# Patient Record
Sex: Male | Born: 1985 | Race: Black or African American | Hispanic: No | Marital: Single | Smoking: Never smoker
Health system: Southern US, Community
[De-identification: ages and names within clinical notes are randomized; demographics above are authoritative.]

## PROBLEM LIST (undated history)

## (undated) DIAGNOSIS — Z8673 Personal history of transient ischemic attack (TIA), and cerebral infarction without residual deficits: Secondary | ICD-10-CM

## (undated) DIAGNOSIS — E790 Hyperuricemia without signs of inflammatory arthritis and tophaceous disease: Secondary | ICD-10-CM

## (undated) DIAGNOSIS — I10 Essential (primary) hypertension: Secondary | ICD-10-CM

## (undated) DIAGNOSIS — I5020 Unspecified systolic (congestive) heart failure: Secondary | ICD-10-CM

## (undated) DIAGNOSIS — K219 Gastro-esophageal reflux disease without esophagitis: Secondary | ICD-10-CM

## (undated) DIAGNOSIS — N184 Chronic kidney disease, stage 4 (severe): Secondary | ICD-10-CM

## (undated) DIAGNOSIS — Z8679 Personal history of other diseases of the circulatory system: Secondary | ICD-10-CM

## (undated) DIAGNOSIS — E669 Obesity, unspecified: Secondary | ICD-10-CM

## (undated) DIAGNOSIS — I129 Hypertensive chronic kidney disease with stage 1 through stage 4 chronic kidney disease, or unspecified chronic kidney disease: Secondary | ICD-10-CM

## (undated) DIAGNOSIS — E785 Hyperlipidemia, unspecified: Secondary | ICD-10-CM

## (undated) DIAGNOSIS — M109 Gout, unspecified: Secondary | ICD-10-CM

## (undated) DIAGNOSIS — I428 Other cardiomyopathies: Secondary | ICD-10-CM

## (undated) DIAGNOSIS — R7303 Prediabetes: Secondary | ICD-10-CM

## (undated) DIAGNOSIS — M1990 Unspecified osteoarthritis, unspecified site: Secondary | ICD-10-CM

## (undated) DIAGNOSIS — L739 Follicular disorder, unspecified: Secondary | ICD-10-CM

## (undated) HISTORY — DX: Chronic kidney disease, stage 4 (severe): N18.4

## (undated) HISTORY — DX: Hyperuricemia without signs of inflammatory arthritis and tophaceous disease: E79.0

## (undated) HISTORY — DX: Hyperlipidemia, unspecified: E78.5

## (undated) HISTORY — DX: Hypertensive chronic kidney disease with stage 1 through stage 4 chronic kidney disease, or unspecified chronic kidney disease: I12.9

## (undated) HISTORY — DX: Personal history of other diseases of the circulatory system: Z86.79

## (undated) HISTORY — DX: Unspecified osteoarthritis, unspecified site: M19.90

## (undated) HISTORY — DX: Gastro-esophageal reflux disease without esophagitis: K21.9

## (undated) HISTORY — DX: Essential (primary) hypertension: I10

## (undated) HISTORY — DX: Obesity, unspecified: E66.9

## (undated) HISTORY — DX: Personal history of transient ischemic attack (TIA), and cerebral infarction without residual deficits: Z86.73

## (undated) HISTORY — DX: Prediabetes: R73.03

## (undated) HISTORY — DX: Other cardiomyopathies: I42.8

---
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
## 2007-08-23 ENCOUNTER — Emergency Department (HOSPITAL_COMMUNITY): Admission: EM | Admit: 2007-08-23 | Discharge: 2007-08-23 | Payer: Self-pay | Admitting: Emergency Medicine

## 2007-08-24 ENCOUNTER — Emergency Department (HOSPITAL_COMMUNITY): Admission: EM | Admit: 2007-08-24 | Discharge: 2007-08-24 | Payer: Self-pay | Admitting: Emergency Medicine

## 2008-09-20 DIAGNOSIS — Z8673 Personal history of transient ischemic attack (TIA), and cerebral infarction without residual deficits: Secondary | ICD-10-CM

## 2008-09-20 HISTORY — DX: Personal history of transient ischemic attack (TIA), and cerebral infarction without residual deficits: Z86.73

## 2008-09-27 ENCOUNTER — Ambulatory Visit: Payer: Self-pay | Admitting: Pulmonary Disease

## 2008-09-27 ENCOUNTER — Ambulatory Visit: Payer: Self-pay | Admitting: Internal Medicine

## 2008-09-27 ENCOUNTER — Encounter: Payer: Self-pay | Admitting: Emergency Medicine

## 2008-09-27 ENCOUNTER — Inpatient Hospital Stay (HOSPITAL_COMMUNITY): Admission: AD | Admit: 2008-09-27 | Discharge: 2008-10-04 | Payer: Self-pay | Admitting: Internal Medicine

## 2008-09-28 ENCOUNTER — Encounter (INDEPENDENT_AMBULATORY_CARE_PROVIDER_SITE_OTHER): Payer: Self-pay | Admitting: Internal Medicine

## 2008-10-08 ENCOUNTER — Ambulatory Visit (HOSPITAL_COMMUNITY): Admission: RE | Admit: 2008-10-08 | Discharge: 2008-10-08 | Payer: Self-pay | Admitting: Internal Medicine

## 2008-10-15 ENCOUNTER — Encounter (HOSPITAL_COMMUNITY): Admission: RE | Admit: 2008-10-15 | Discharge: 2008-11-14 | Payer: Self-pay | Admitting: Internal Medicine

## 2009-12-13 ENCOUNTER — Emergency Department (HOSPITAL_COMMUNITY): Admission: EM | Admit: 2009-12-13 | Discharge: 2009-12-13 | Payer: Self-pay | Admitting: Emergency Medicine

## 2010-10-11 ENCOUNTER — Encounter: Payer: Self-pay | Admitting: Internal Medicine

## 2011-01-04 LAB — CBC
HCT: 52.2 % — ABNORMAL HIGH (ref 39.0–52.0)
Hemoglobin: 16.1 g/dL (ref 13.0–17.0)
Hemoglobin: 16.6 g/dL (ref 13.0–17.0)
MCHC: 33.1 g/dL (ref 30.0–36.0)
MCHC: 33.8 g/dL (ref 30.0–36.0)
MCHC: 33.8 g/dL (ref 30.0–36.0)
MCV: 89.4 fL (ref 78.0–100.0)
MCV: 90 fL (ref 78.0–100.0)
MCV: 90.1 fL (ref 78.0–100.0)
Platelets: 314 10*3/uL (ref 150–400)
Platelets: 260 10*3/uL (ref 150–400)
RBC: 6.12 MIL/uL — ABNORMAL HIGH (ref 4.22–5.81)
RBC: 5.29 MIL/uL (ref 4.22–5.81)
RBC: 5.44 MIL/uL (ref 4.22–5.81)
RBC: 5.58 MIL/uL (ref 4.22–5.81)
RBC: 5.8 MIL/uL (ref 4.22–5.81)
RBC: 5.88 MIL/uL — ABNORMAL HIGH (ref 4.22–5.81)
RDW: 14.2 % (ref 11.5–15.5)
WBC: 10.1 10*3/uL (ref 4.0–10.5)
WBC: 11.1 10*3/uL — ABNORMAL HIGH (ref 4.0–10.5)
WBC: 12.1 10*3/uL — ABNORMAL HIGH (ref 4.0–10.5)
WBC: 13.3 10*3/uL — ABNORMAL HIGH (ref 4.0–10.5)
WBC: 14.1 10*3/uL — ABNORMAL HIGH (ref 4.0–10.5)

## 2011-01-04 LAB — BRAIN NATRIURETIC PEPTIDE
Pro B Natriuretic peptide (BNP): 122 pg/mL — ABNORMAL HIGH (ref 0.0–100.0)
Pro B Natriuretic peptide (BNP): 128 pg/mL — ABNORMAL HIGH (ref 0.0–100.0)

## 2011-01-04 LAB — RAPID URINE DRUG SCREEN, HOSP PERFORMED
Cocaine: NOT DETECTED
Opiates: POSITIVE — AB
Tetrahydrocannabinol: NOT DETECTED

## 2011-01-04 LAB — DIFFERENTIAL
Lymphocytes Relative: 22 % (ref 12–46)
Lymphs Abs: 2.2 10*3/uL (ref 0.7–4.0)
Monocytes Relative: 5 % (ref 3–12)
Neutro Abs: 7.2 10*3/uL (ref 1.7–7.7)
Neutrophils Relative %: 72 % (ref 43–77)

## 2011-01-04 LAB — PROTIME-INR
INR: 1 (ref 0.00–1.49)
INR: 1.1 (ref 0.00–1.49)
INR: 2.3 — ABNORMAL HIGH (ref 0.00–1.49)
Prothrombin Time: 13.4 seconds (ref 11.6–15.2)
Prothrombin Time: 14.2 seconds (ref 11.6–15.2)
Prothrombin Time: 15.6 seconds — ABNORMAL HIGH (ref 11.6–15.2)
Prothrombin Time: 19.6 seconds — ABNORMAL HIGH (ref 11.6–15.2)
Prothrombin Time: 26.8 seconds — ABNORMAL HIGH (ref 11.6–15.2)

## 2011-01-04 LAB — BASIC METABOLIC PANEL
BUN: 24 mg/dL — ABNORMAL HIGH (ref 6–23)
BUN: 34 mg/dL — ABNORMAL HIGH (ref 6–23)
CO2: 24 mEq/L (ref 19–32)
CO2: 24 mEq/L (ref 19–32)
Calcium: 9.2 mg/dL (ref 8.4–10.5)
Calcium: 8.9 mg/dL (ref 8.4–10.5)
Calcium: 9.1 mg/dL (ref 8.4–10.5)
Chloride: 111 mEq/L (ref 96–112)
Creatinine, Ser: 2.22 mg/dL — ABNORMAL HIGH (ref 0.4–1.5)
Creatinine, Ser: 2.25 mg/dL — ABNORMAL HIGH (ref 0.4–1.5)
GFR calc Af Amer: 44 mL/min — ABNORMAL LOW (ref >60)
GFR calc Af Amer: 36 mL/min — ABNORMAL LOW (ref >60)
GFR calc Af Amer: 44 mL/min — ABNORMAL LOW (ref >60)
GFR calc non Af Amer: 37 mL/min — ABNORMAL LOW (ref >60)
GFR calc non Af Amer: 30 mL/min — ABNORMAL LOW (ref >60)
Sodium: 138 mEq/L (ref 135–145)

## 2011-01-04 LAB — URINE MICROSCOPIC-ADD ON

## 2011-01-04 LAB — COMPREHENSIVE METABOLIC PANEL
ALT: 31 U/L (ref 0–53)
AST: 21 U/L (ref 0–37)
BUN: 22 mg/dL (ref 6–23)
CO2: 20 mEq/L (ref 19–32)
CO2: 21 mEq/L (ref 19–32)
Calcium: 8.9 mg/dL (ref 8.4–10.5)
Calcium: 9.3 mg/dL (ref 8.4–10.5)
Chloride: 107 mEq/L (ref 96–112)
Chloride: 109 mEq/L (ref 96–112)
Creatinine, Ser: 2.34 mg/dL — ABNORMAL HIGH (ref 0.4–1.5)
GFR calc Af Amer: 41 mL/min — ABNORMAL LOW (ref >60)
GFR calc non Af Amer: 34 mL/min — ABNORMAL LOW (ref >60)
GFR calc non Af Amer: 34 mL/min — ABNORMAL LOW (ref >60)
Sodium: 142 mEq/L (ref 135–145)
Total Bilirubin: 1.9 mg/dL — ABNORMAL HIGH (ref 0.3–1.2)

## 2011-01-04 LAB — BLOOD GAS, ARTERIAL
Bicarbonate: 20.9 mEq/L (ref 20.0–24.0)
FIO2: 0.21 %
O2 Saturation: 91.9 %
Patient temperature: 98.6
pH, Arterial: 7.446 (ref 7.350–7.450)
pO2, Arterial: 62.4 mmHg — ABNORMAL LOW (ref 80.0–100.0)

## 2011-01-04 LAB — CARDIAC PANEL(CRET KIN+CKTOT+MB+TROPI)
CK, MB: 1.2 ng/mL (ref 0.3–4.0)
CK, MB: 1.7 ng/mL (ref 0.3–4.0)
Relative Index: INVALID (ref 0.0–2.5)
Total CK: 141 U/L (ref 7–232)
Total CK: 351 U/L — ABNORMAL HIGH (ref 7–232)
Total CK: 95 U/L (ref 7–232)
Troponin I: 0.05 ng/mL (ref 0.00–0.06)
Troponin I: 0.07 ng/mL — ABNORMAL HIGH (ref 0.00–0.06)

## 2011-01-04 LAB — URINALYSIS, ROUTINE W REFLEX MICROSCOPIC
Bilirubin Urine: NEGATIVE
Glucose, UA: NEGATIVE mg/dL
Specific Gravity, Urine: >1.03 — ABNORMAL HIGH (ref 1.005–1.030)
Urobilinogen, UA: 0.2 mg/dL (ref 0.0–1.0)

## 2011-01-04 LAB — RENAL FUNCTION PANEL
Calcium: 9.3 mg/dL (ref 8.4–10.5)
Creatinine, Ser: 2.16 mg/dL — ABNORMAL HIGH (ref 0.4–1.5)
GFR calc Af Amer: 46 mL/min — ABNORMAL LOW (ref >60)
GFR calc non Af Amer: 38 mL/min — ABNORMAL LOW (ref >60)
Phosphorus: 3.8 mg/dL (ref 2.3–4.6)
Sodium: 142 mEq/L (ref 135–145)

## 2011-01-04 LAB — HEPARIN LEVEL (UNFRACTIONATED)
Heparin Unfractionated: 0.5 IU/mL (ref 0.30–0.70)
Heparin Unfractionated: 0.63 IU/mL (ref 0.30–0.70)
Heparin Unfractionated: 0.63 IU/mL (ref 0.30–0.70)
Heparin Unfractionated: 1.42 IU/mL — ABNORMAL HIGH (ref 0.30–0.70)

## 2011-01-04 LAB — APTT: aPTT: 28 seconds (ref 24–37)

## 2011-01-04 LAB — PROTEIN, URINE, 24 HOUR
Collection Interval-UPROT: 24 hours
Urine Total Volume-UPROT: 6500 mL

## 2011-01-04 LAB — HOMOCYSTEINE: Homocysteine: 24.9 umol/L — ABNORMAL HIGH (ref 4.0–15.4)

## 2011-01-04 LAB — D-DIMER, QUANTITATIVE: D-Dimer, Quant: 0.8 ug/mL-FEU — ABNORMAL HIGH (ref 0.00–0.48)
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
## 2011-02-02 NOTE — Discharge Summary (Signed)
Zachary Lynch, Zachary Lynch                  ACCOUNT NO.:  [id number]   MEDICAL RECORD NO.:  [id number]          PATIENT TYPE:  INP   LOCATION:  3036                         FACILITY:  [facility]   PHYSICIAN:  Rexene Alberts, M.D.    DATE OF BIRTH:  [birth year]   DATE OF ADMISSION:  09/27/2008  DATE OF DISCHARGE:  10/04/2008                               DISCHARGE SUMMARY   DISCHARGE DIAGNOSES:  1. Acute right hemispheric stroke, felt to be cardioembolic given the      low ejection fraction of 15%.  (Anticoagulation was started during      the hospitalization.)  2. Moderate focal narrowing of the distal M-1 segment of the right      middle cerebral artery per MRA of the head on September 28, 2008.  3. Mild dysarthria and mild left-sided hemiparesis secondary to the      acute right brain stroke.  4. Severe cardiomyopathy with left and right heart dysfunction and an      ejection fraction of 15% per 2D echocardiogram on September 28, 2008.      (Suspect nonischemic cardiomyopathy.)  LifeVest applied prior to      hospital discharge.  5. Nonsustained ventricular tachycardia.  6. Suspect obstructive sleep apnea.  (The patient will need to be      scheduled for an outpatient sleep study.)  7. Hypertension.  8. Dyslipidemia.  9. Chronic kidney disease, stage III, with proteinuria.  Baseline      creatinine appears to be 2.2.  10.Elevated homocystine level.  11.Upper respiratory infection.   DISCHARGE MEDICATIONS:  1. Aspirin 325 mg daily.  2. Coreg 12.5 mg b.i.d.  3. Lasix 40 mg daily.  4. Hydralazine 25 mg t.i.d.  5. Isosorbide mononitrate 30 mg daily.  6. Protonix 40 mg daily.  7. Potassium chloride 20 mEq twice daily for 1 week and then 1 pill      daily thereafter.  8. Zocor 40 mg nightly.  9. Foltx 1 tablet daily.  10.Coumadin 5 mg take 1-1/2 pills (7.5 mg) each evening.   DISCHARGE DISPOSITION:  The patient was discharged to home in improved  and stable condition.  He was advised to follow  up with [facility] on October 07, 2008, for reevaluation of his INR.  The patient was also advised to follow up with his primary care  physician Dr. Gerarda Fraction in 1-2 weeks or sooner.  He was also instructed to  follow up with neurologist, Dr. Leonie Man in 2 months.   CONSULTATIONS:  1. Neurologist, Dr. Rosine Beat  2. Pulmonologist, Dr. Elsworth Soho.  3. Cardiologist, Dr. Elisabeth Cara and colleagues.  4. Cardiologist, Dr. Virl Axe.  5. Stroke Team.   PROCEDURES PERFORMED:  1. MRA of the head and MRA of the neck on September 28, 2008.  2. CT scan of the head without contrast on September 28, 2008.  3. Renal ultrasound on September 29, 2008.  4. Chest x-ray on September 29, 2008.  5. A 2D echocardiogram on September 28, 2008.  The results revealed left      ventricular size  was at the upper limits of normal.  Overall, left      ventricular systolic function was severely reduced.  Left      ventricular ejection fraction was estimated to be 15%.  There was      severe diffuse left ventricular hypokinesis.  Left ventricular wall      thickness was mildly increased.  Mild mitral valve regurgitation.      Left atrium was mildly dilated.  Right ventricular systolic      function was markedly reduced.  6. Bilateral lower extremity venous Doppler study on September 28, 2008.      The results revealed no evidence of deep vein thrombosis.  7. Carotid Doppler study on September 28, 2008.  The results revealed no      ICA stenosis.  Vertebral artery flow antegrade.  8. V/Q scan on September 28, 2008.  The results revealed low probability      for pulmonary embolism.  9. Transcranial Doppler study performed on October 01, 2008.  10.Left upper extremity PICC inserted and then discontinued during the      hospitalization.   HISTORY OF PRESENT ILLNESS:  The patient is a 25 year old man with a  past medical history significant for hypertension, who presented to  [facility] on September 27, 2008, with a chief  complaint of  slurred speech and a left-sided facial droop.  He was evaluated in the  emergency department at [facility].  An MRI of the brain was  ordered and it revealed an acute right hemispheric stroke.  The patient  was therefore transferred to [facility] for further evaluation  and management.  Of note, the patient had been started on a Z-Pak and an  albuterol nebulizer for an upper respiratory infection 2 days prior to  his presentation to the emergency department at [facility].   For additional details, please see the dictated history and physical.   HOSPITAL COURSE:  1. Acute right hemispheric stroke, mild dysarthria, left hemiparesis.      The patient's swallowing function was evaluated at the time of the      initial hospital assessment and he apparently passed.  He was then      started on aspirin therapy.  For further evaluation, a number of      laboratory and radiographic studies were ordered.  In the meantime,      neurologist, Dr. Rosine Beat from the Stroke Team was consulted.  Dr.      Rosine Beat evaluated the patient upon his arrival to [facility].  She agreed with the workup that was started.  She also      recommended ordering an echocardiogram with a bubble study, MRA of      the brain and neck, and CT of the chest to rule out pulmonary      embolism.  The results of the laboratory studies revealed a urine      drug screen that was only positive for opiates, a urinalysis that      was positive for greater than 300 protein, an ABG which revealed a      PCO2 of 31 and a PO2 of 62, a WBC of 12.1, a D-dimer of 0.80, and a      troponin I of 0.07.  The random blood cortisol was within normal      limits at 16.7.  His TSH was within normal limits at 3.3.  His  homocysteine level was elevated at 24.9.  He was therefore started      on Foltx.  His BNP was marginally elevated at 128.  Further cardiac      enzymes were negative with a  troponin I on followup of 0.04.  His      hemoglobin A1c was 5.7.  His electrolyte panel was significant for      a serum potassium of 3.2 and a creatinine of 2.65.  The patient was      repleted with potassium chloride during the hospitalization.  A      followup CT scan of the head on September 28, 2008, revealed an      expected evolution of the right MCA territory infarct, but no      evidence of a new infarct or intracranial hemorrhage.  The MRA of      the head and neck revealed a somewhat limited exam, but there was      no evidence of a complete occlusion of the right carotid terminus      or M1 segment of the right middle cerebral artery, but there may      have been a focal moderate narrowing of the distal M1 segment of      the right middle cerebral artery.  The results of the carotid      Doppler study were negative.  The results of the transcranial      Doppler study were pending at the time of hospital discharge.   The patient's 2D echocardiogram revealed an ejection fraction of 15% and  severe left ventricular hypokinesis and right ventricular systolic  function that was markedly reduced.  Given this finding, cardiologist  Dr. Elisabeth Cara was consulted.  Additional details are below; however, given  the patient's very low ejection fraction, the stroke was felt to be  cardioembolic in nature and therefore, anticoagulation was started with  heparin and Coumadin.  Over the course of the hospitalization, the  patient received physical therapy, occupational therapy, and speech  therapy.  With the therapies given, the extent of his dysarthria  decreased and he actually became stronger with regards to his left-sided  strength.  At the time of hospital discharge, the patient was ambulating  without any assistance.  He was discharged to home on Coumadin once the  INR  became therapeutic.  The patient was advised to follow up with  neurologist, Dr. Leonie Man, for a potential bubble study in the  next 2-3  months.   1. Severe cardiomyopathy with left and right heart dysfunction and an      ejection fraction of 15%.  As indicated above, the echocardiogram      revealed severe LV and RV dysfunction.  Once these findings were      ascertained, cardiologist, Dr. Elisabeth Cara was consulted.  He      recommended aggressive treatment of the patient's hypertension.      Per his recommendations, intravenous heparin and Coumadin were      started.  A number of cardiac medications were added along the way      including Lasix, hydralazine, Imdur, and Coreg.  The patient's      fasting lipid panel was assessed and it revealed a total      cholesterol of 197, triglycerides of 131, HDL cholesterol of 17,      and LDL cholesterol of 154.  He was therefore started on Zocor 40      mg nightly.  As indicated  above, cardiac enzymes were ordered.  The      initial troponin I was only marginally elevated.  Subsequently, the      troponin-I normalized.  There was a discussion about a need for a      TEE; however, given the overall clinical scenario, Dr. Elisabeth Cara felt      that it was not needed at the time of his followup assessment given      what was already known.  The patient experienced nonsustained      ventricular tachycardia during the hospitalization.  Cardiologist,      Dr. Melvern Banker evaluated the patient following this episode.  He      consulted EP physician, Dr. Caryl Comes.  Dr. Caryl Comes evaluated the patient      and recommended ongoing medical therapy as was previously started.      He also strongly recommended the LifeVest for this young patient.      Once the LifeVest was obtained, it was applied.  The patient was      educated on the application of the LifeVest.  He will need to be      maintained on the LifeVest for approximately 3-6 months, hopefully,      to improve his LV function.  As stated above, anticoagulation will      continue with Coumadin.  Aspirin therapy was also added.  The       patient's INR was therapeutic at 2.3 prior to hospital discharge.      He was discharged to home on 7.5 mg of Coumadin daily.  The patient      will need to have his INR rechecked by either his primary care      physician, Dr. Gerarda Fraction, or by [facility]      Cardiology in approximately 3-4 days following hospital discharge.      Appropriate phone numbers were given to the patient and he      demonstrated good understanding of the instructions.   1. Suspected obstructive sleep apnea and upper respiratory infection.      The patient developed some respiratory distress during the first 24-      48 hours of the hospitalization.  For further evaluation, a D-dimer      was ordered and it was elevated at 0.8.  His ABG revealed a pH of      7.4, PCO2 of 30.8, and PO2 of 62.4 on room air.  Pulmonologist, Dr.      Elsworth Soho was consulted.  Per his assessment, the patient probably had      obstructive sleep apnea and in the setting of an acute stroke, he      may have become dyspneic as a result.  Nevertheless, because of the      elevated D-dimer, a V/Q scan was ordered to rule out PE.  It      revealed low probability for PE.  A chest x-ray was ordered and      revealed no acute disease initially.  However, the followup chest x-      ray did reveal some vascular congestion.  The patient was started      on Lasix and empiric antibiotic treatment with Avelox.  CPAP      therapy was started empirically.  The patient tolerated CPAP      therapy during the hospital course.  Dr. Elsworth Soho recommended having      the patient scheduled for an outpatient sleep study.  The      scheduling was not performed during the hospitalization and      therefore it can be arranged in the outpatient setting by his      primary physicians.  He completed the course of Avelox.  At the      time of his hospital discharge, the patient was oxygenating 97% on      room air and had no complaints of shortness of  breath.   1. Chronic kidney disease and proteinuria.  At the time of the initial      hospital assessment, the patient's creatinine was 2.22.  A renal      ultrasound was ordered and it revealed no evidence of      hydronephrosis.  However, it did reveal increased renal cortical      echogenicity likely indicative of underlying medical renal disease.      His left      kidney measured 12.4 cm and his right kidney measured 10.3 cm.  A      24-hour urine evaluation was ordered and it revealed 2470 mg of      protein.  His renal function remained more or less stable generally      ranging from 2.1 to 2.3.  An outpatient Nephrology consultation is      recommended following discharge.      Rexene Alberts, M.D.  Electronically Signed     DF/MEDQ  D:  10/05/2008  T:  10/06/2008  Job:  [id number]   cc:   Sherrilee Gilles. Gerarda Fraction, MD  Tery Sanfilippo, MD  Deboraha Sprang, MD, [place] [facility]. Leonie Man, MD

## 2011-06-28 LAB — CBC
HCT: 51.4
Hemoglobin: 17.2 — ABNORMAL HIGH
MCHC: 33.5
MCV: 86
RBC: 5.98 — ABNORMAL HIGH
WBC: 7.7

## 2011-06-28 LAB — DIFFERENTIAL
Basophils Relative: 1
Eosinophils Absolute: 0.1 — ABNORMAL LOW
Eosinophils Relative: 1
Lymphs Abs: 2.8
Monocytes Absolute: 0.5
Monocytes Relative: 6
Neutrophils Relative %: 57

## 2011-06-28 LAB — BASIC METABOLIC PANEL
CO2: 28
Chloride: 107
Creatinine, Ser: 1.95 — ABNORMAL HIGH
GFR calc Af Amer: 52 — ABNORMAL LOW
Potassium: 3.8

## 2012-01-06 ENCOUNTER — Ambulatory Visit (INDEPENDENT_AMBULATORY_CARE_PROVIDER_SITE_OTHER): Payer: Self-pay | Admitting: Otolaryngology

## 2012-10-19 ENCOUNTER — Ambulatory Visit (INDEPENDENT_AMBULATORY_CARE_PROVIDER_SITE_OTHER): Payer: Self-pay | Admitting: Otolaryngology

## 2012-11-02 ENCOUNTER — Ambulatory Visit (INDEPENDENT_AMBULATORY_CARE_PROVIDER_SITE_OTHER): Payer: Self-pay | Admitting: Otolaryngology

## 2012-11-16 ENCOUNTER — Ambulatory Visit (INDEPENDENT_AMBULATORY_CARE_PROVIDER_SITE_OTHER): Payer: No Typology Code available for payment source | Admitting: Otolaryngology

## 2012-11-16 DIAGNOSIS — K219 Gastro-esophageal reflux disease without esophagitis: Secondary | ICD-10-CM

## 2012-11-16 DIAGNOSIS — R490 Dysphonia: Secondary | ICD-10-CM

## 2013-01-18 DIAGNOSIS — K219 Gastro-esophageal reflux disease without esophagitis: Secondary | ICD-10-CM | POA: Insufficient documentation

## 2013-08-08 ENCOUNTER — Encounter (HOSPITAL_COMMUNITY): Payer: Self-pay | Admitting: Emergency Medicine

## 2013-08-08 ENCOUNTER — Emergency Department (HOSPITAL_COMMUNITY)
Admission: EM | Admit: 2013-08-08 | Discharge: 2013-08-08 | Disposition: A | Discharge status: Home / Self Care | Payer: BC Managed Care – PPO | Attending: Emergency Medicine | Admitting: Emergency Medicine

## 2013-08-08 DIAGNOSIS — Z79899 Other long term (current) drug therapy: Secondary | ICD-10-CM | POA: Insufficient documentation

## 2013-08-08 DIAGNOSIS — I10 Essential (primary) hypertension: Secondary | ICD-10-CM

## 2013-08-08 DIAGNOSIS — M109 Gout, unspecified: Secondary | ICD-10-CM

## 2013-08-08 DIAGNOSIS — IMO0002 Reserved for concepts with insufficient information to code with codable children: Secondary | ICD-10-CM | POA: Insufficient documentation

## 2013-08-08 DIAGNOSIS — Z8673 Personal history of transient ischemic attack (TIA), and cerebral infarction without residual deficits: Secondary | ICD-10-CM | POA: Insufficient documentation

## 2013-08-08 DIAGNOSIS — R509 Fever, unspecified: Secondary | ICD-10-CM | POA: Insufficient documentation

## 2013-08-08 HISTORY — DX: Gout, unspecified: M10.9

## 2013-08-08 MED ORDER — PREDNISONE 20 MG PO TABS: 20.0000 mg | ORAL_TABLET | Freq: Two times a day (BID) | ORAL | Status: DC

## 2013-08-08 MED ORDER — HYDROMORPHONE HCL PF 2 MG/ML IJ SOLN
2.0000 mg | Freq: Once | INTRAMUSCULAR | Status: AC
  Administered 2013-08-08: 2 mg via INTRAMUSCULAR
  Filled 2013-08-08: qty 1

## 2013-08-08 MED ORDER — ONDANSETRON 8 MG PO TBDP
8.0000 mg | ORAL_TABLET | Freq: Once | ORAL | Status: AC
  Administered 2013-08-08: 8 mg via ORAL
  Filled 2013-08-08: qty 1

## 2013-08-08 MED ORDER — PREDNISONE 50 MG PO TABS
60.0000 mg | ORAL_TABLET | Freq: Once | ORAL | Status: AC
  Administered 2013-08-08: 60 mg via ORAL
  Filled 2013-08-08 (×2): qty 1

## 2013-08-08 MED ORDER — OXYCODONE-ACETAMINOPHEN 5-325 MG PO TABS: 1.0000 | ORAL_TABLET | ORAL | Status: DC | PRN

## 2013-08-08 NOTE — ED Notes (Signed)
Pt c/o pain in left ankle and foot since [facility].  Foot swollen and ankle swollen.  Pedal pulse present.

## 2013-08-08 NOTE — ED Provider Notes (Signed)
CSN: [id number]     Arrival date & time 08/08/13  1548 History  This chart was scribed for Zachary Blade, MD by Rolanda Lundborg, ED Scribe. This patient was seen in room APA19/APA19 and the patient's care was started at 5:12 PM.   Chief Complaint  Patient presents with  . Ankle Pain  . Foot Pain    The history is provided by the patient. No language interpreter was used.   HPI Comments: AERICK Lynch is a 27 y.o. male who presents to the Emergency Department complaining of left foot and left ankle pain due to gout since two days ago. He was taking preventative medication but recently stopped taking it. He also reports fever and lack of appetite. He has not taken any OTC pain medication. 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 When he woke his left side was numb, mouth was twisted, slurred speech for 2-3 days. He wore an external defibrillator for 3 months. His heart is fine now. He states he did not take his blood pressure medication today because he has not eaten and did not want to take it on an empty stomach.  Past Medical History  Diagnosis Date  . Gout   . Hypertension   . Stroke    History reviewed. No pertinent past surgical history. No family history on file. History  Substance Use Topics  . Smoking status: Not on file  . Smokeless tobacco: Not on file  . Alcohol Use: Not on file    Review of Systems  Constitutional: Positive for fever and appetite change.  Musculoskeletal: Positive for arthralgias (left foot\).  All other systems reviewed and are negative.    Allergies  Albuterol  Home Medications   Current Outpatient Rx  Name  Route  Sig  Dispense  Refill  . amLODipine (NORVASC) 10 MG tablet   Oral   Take 10 mg by mouth daily.         . carvedilol (COREG) 25 MG tablet   Oral   Take 50 mg by mouth 2 (two) times daily with a meal.         . pantoprazole (PROTONIX) 40 MG tablet   Oral   Take 40 mg by mouth daily.         Marland Kitchen  oxyCODONE-acetaminophen (PERCOCET) 5-325 MG per tablet   Oral   Take 1 tablet by mouth every 4 (four) hours as needed for severe pain.   30 tablet   0   . predniSONE (DELTASONE) 20 MG tablet   Oral   Take 1 tablet (20 mg total) by mouth 2 (two) times daily.   10 tablet   0    BP 194/127  Pulse 116  Temp(Src) 99.6 F (37.6 C) (Oral)  Resp 18  Ht 6\' 1"  (1.854 m)  Wt 350 lb (158.759 kg)  BMI 46.19 kg/m2  SpO2 100% Physical Exam  Nursing note and vitals reviewed. Constitutional: He is oriented to person, place, and time. He appears well-developed and well-nourished.  HENT:  Head: Normocephalic and atraumatic.  Right Ear: External ear normal.  Left Ear: External ear normal.  Eyes: Conjunctivae and EOM are normal. Pupils are equal, round, and reactive to light.  Neck: Normal range of motion and phonation normal. Neck supple.  Cardiovascular: Normal rate, regular rhythm, normal heart sounds and intact distal pulses.   Pulmonary/Chest: Effort normal and breath sounds normal. He exhibits no bony tenderness.  Abdominal: Soft. Normal appearance. There  is no tenderness.  Musculoskeletal: Normal range of motion.  Tenderness over the entire midfoot and swollen with swelling and mild erythema. No toe or intertrigonous abnormality.   Neurological: He is alert and oriented to person, place, and time. No cranial nerve deficit or sensory deficit. He exhibits normal muscle tone. Coordination normal.  Skin: Skin is warm, dry and intact.  Psychiatric: He has a normal mood and affect. His behavior is normal. Judgment and thought content normal.    ED Course  Procedures (including critical care time) Medications  HYDROmorphone (DILAUDID) injection 2 mg (2 mg Intramuscular Given 08/08/13 1759)  ondansetron (ZOFRAN-ODT) disintegrating tablet 8 mg (8 mg Oral Given 08/08/13 1759)  predniSONE (DELTASONE) tablet 60 mg (60 mg Oral Given 08/08/13 1759)    DIAGNOSTIC STUDIES: Oxygen Saturation is  100% on RA, normal by my interpretation.    COORDINATION OF CARE: 5:45 PM- Discussed treatment plan with pt which includes treatment with pain meds and antiinflammatories. Pt agrees to plan.   Patient Vitals for the past 24 hrs:  BP Temp Temp src Pulse Resp SpO2 Height Weight  08/08/13 2014 174/100 mmHg - - 95 18 98 % - -  08/08/13 1804 173/128 mmHg - - 102 18 98 % - -  08/08/13 1618 194/127 mmHg 99.6 F (37.6 C) Oral 116 18 100 % 6\' 1"  (1.854 m) 350 lb (158.759 kg)    7:58 PM- Will give pain medication and put him on prednisone. Advised pt to go back to PCP for preventative medication. Pt agrees to plan.  MDM   1. Gout attack   2. Hypertension    Gout attack, with improvement after ED treatment. He is stable for discharge.  Nursing Notes Reviewed/ Care Coordinated, and agree without changes. Applicable Imaging Reviewed.  Interpretation of Laboratory Data incorporated into ED treatment   Plan: Home Medications- Percocet, prednisone; Home Treatments and Observation- rest, elevation. Work release; return here if the recommended treatment, does not improve the symptoms; Recommended follow up- PCP, one week       I personally performed the services described in this documentation, which was scribed in my presence. The recorded information has been reviewed and is accurate.      Zachary Blade, MD 08/08/13 [phone]

## 2013-08-26 ENCOUNTER — Encounter (HOSPITAL_COMMUNITY): Payer: Self-pay | Admitting: Emergency Medicine

## 2013-08-26 ENCOUNTER — Emergency Department (HOSPITAL_COMMUNITY)
Admission: EM | Admit: 2013-08-26 | Discharge: 2013-08-26 | Disposition: A | Discharge status: Home / Self Care | Payer: BC Managed Care – PPO | Attending: Emergency Medicine | Admitting: Emergency Medicine

## 2013-08-26 DIAGNOSIS — Z79899 Other long term (current) drug therapy: Secondary | ICD-10-CM | POA: Insufficient documentation

## 2013-08-26 DIAGNOSIS — M109 Gout, unspecified: Secondary | ICD-10-CM

## 2013-08-26 DIAGNOSIS — Z8673 Personal history of transient ischemic attack (TIA), and cerebral infarction without residual deficits: Secondary | ICD-10-CM | POA: Insufficient documentation

## 2013-08-26 DIAGNOSIS — I10 Essential (primary) hypertension: Secondary | ICD-10-CM | POA: Insufficient documentation

## 2013-08-26 DIAGNOSIS — IMO0002 Reserved for concepts with insufficient information to code with codable children: Secondary | ICD-10-CM | POA: Insufficient documentation

## 2013-08-26 DIAGNOSIS — M1A00X1 Idiopathic chronic gout, unspecified site, with tophus (tophi): Secondary | ICD-10-CM | POA: Insufficient documentation

## 2013-08-26 MED ORDER — PREDNISONE 50 MG PO TABS
60.0000 mg | ORAL_TABLET | Freq: Once | ORAL | Status: AC
  Administered 2013-08-26: 60 mg via ORAL
  Filled 2013-08-26 (×2): qty 1

## 2013-08-26 MED ORDER — ONDANSETRON 4 MG PO TBDP
4.0000 mg | ORAL_TABLET | Freq: Once | ORAL | Status: AC
  Administered 2013-08-26: 4 mg via ORAL
  Filled 2013-08-26: qty 1

## 2013-08-26 MED ORDER — HYDROMORPHONE HCL PF 2 MG/ML IJ SOLN
2.0000 mg | Freq: Once | INTRAMUSCULAR | Status: AC
  Administered 2013-08-26: 2 mg via INTRAMUSCULAR
  Filled 2013-08-26: qty 1

## 2013-08-26 MED ORDER — INDOMETHACIN 25 MG PO CAPS: 25.0000 mg | ORAL_CAPSULE | Freq: Two times a day (BID) | ORAL | Status: DC

## 2013-08-26 MED ORDER — PREDNISONE 10 MG PO TABS: ORAL_TABLET | ORAL | Status: DC

## 2013-08-26 MED ORDER — OXYCODONE-ACETAMINOPHEN 10-325 MG PO TABS: 1.0000 | ORAL_TABLET | ORAL | Status: DC | PRN

## 2013-08-26 MED ORDER — COLCHICINE 0.6 MG PO TABS: 0.6000 mg | ORAL_TABLET | Freq: Two times a day (BID) | ORAL | Status: DC | PRN

## 2013-08-26 NOTE — ED Provider Notes (Signed)
CSN: [id number]     Arrival date & time 08/26/13  2030 History  This chart was scribed for Lebron Quam, MD by Eston Mould, ED Scribe. This patient was seen in room APA07/APA07 and the patient's care was started at 8:40 PM   Chief Complaint  Patient presents with  . Gout   The history is provided by the patient. No language interpreter was used.   HPI Comments: Zachary Lynch is a 27 y.o. male with a hx of Gout who presents to the Emergency Department complaining of Gout from his bilateral feet to his knees and R elbow pain that has been going on for 2 weeks. He states he has been in pain and states he only takes medications when he has flare-ups. He states he began having a flare up prior to the Thanksgiving holiday (2014). He states he was seen at [facility] (08/08/2013) and was given a shot of dilaudid and a oral pill of prednisone. He states he felt better within 2 days after. He states about 3 days after Thanksgiving, he began to flare up once more and ran out of his medications prior to seeing his PCP. He states he recently seen his PCP and was prescribed Augmentin due to having post-nasal drip and given a shot of dilaudid. Pt states his pain worsens when weight bearing. Pt denies any associated symptoms.    Past Medical History  Diagnosis Date  . Gout   . Hypertension   . Stroke   . Gout flare 11/25   History reviewed. No pertinent past surgical history. No family history on file. History  Substance Use Topics  . Smoking status: Never Smoker   . Smokeless tobacco: Not on file  . Alcohol Use: No    Review of Systems  Constitutional: Negative for fever, chills, diaphoresis, appetite change and fatigue.  HENT: Negative for mouth sores, sore throat and trouble swallowing.   Eyes: Negative for visual disturbance.  Respiratory: Negative for cough, chest tightness, shortness of breath and wheezing.   Cardiovascular: Negative for chest pain.  Gastrointestinal:  Negative for nausea, vomiting, abdominal pain, diarrhea and abdominal distention.  Endocrine: Negative for polydipsia, polyphagia and polyuria.  Genitourinary: Negative for dysuria, frequency and hematuria.  Musculoskeletal: Positive for gait problem.  Skin: Negative for color change, pallor and rash.  Neurological: Negative for dizziness, syncope, light-headedness and headaches.  Hematological: Does not bruise/bleed easily.  Psychiatric/Behavioral: Negative for behavioral problems and confusion.    Allergies  Albuterol  Home Medications   Current Outpatient Rx  Name  Route  Sig  Dispense  Refill  . amLODipine (NORVASC) 10 MG tablet   Oral   Take 10 mg by mouth daily.         . carvedilol (COREG) 25 MG tablet   Oral   Take 50 mg by mouth 2 (two) times daily with a meal.         . oxyCODONE-acetaminophen (PERCOCET) 5-325 MG per tablet   Oral   Take 1 tablet by mouth every 4 (four) hours as needed for severe pain.   30 tablet   0   . pantoprazole (PROTONIX) 40 MG tablet   Oral   Take 40 mg by mouth daily.         . predniSONE (DELTASONE) 20 MG tablet   Oral   Take 1 tablet (20 mg total) by mouth 2 (two) times daily.   10 tablet   0    Triage Vitals:BP 176/106  Pulse 95  Temp(Src) 98.6 F (37 C) (Oral)  Resp 20  Ht 6\' 1"  (1.854 m)  Wt 350 lb (158.759 kg)  BMI 46.19 kg/m2  SpO2 100%  Physical Exam  Nursing note and vitals reviewed. Constitutional: He is oriented to person, place, and time. He appears well-developed and well-nourished.  HENT:  Head: Normocephalic.  Right Ear: External ear normal.  Left Ear: External ear normal.  Nose: Nose normal.  Mouth/Throat: Oropharynx is clear and moist.  Eyes: EOM are normal. Pupils are equal, round, and reactive to light. Right eye exhibits no discharge. Left eye exhibits no discharge.  Neck: Normal range of motion. Neck supple. No tracheal deviation present.  No nuchal rigidity no meningeal signs   Cardiovascular: Normal rate and regular rhythm.   Pulmonary/Chest: Effort normal and breath sounds normal. No stridor. No respiratory distress. He has no wheezes. He has no rales.  Abdominal: Soft. He exhibits no distension and no mass. There is no tenderness. There is no rebound and no guarding.  Musculoskeletal: Normal range of motion. He exhibits no edema and no tenderness.  R elbow 2 cm tophi; L & R knee's tender but no effusion;   Neurological: He is alert and oriented to person, place, and time. He has normal reflexes. No cranial nerve deficit. Coordination normal.  Skin: Skin is warm. No rash noted. He is not diaphoretic. No erythema. No pallor.  No pettechia no purpura   ED Course  Procedures  DIAGNOSTIC STUDIES: Oxygen Saturation is 100% on RA, normal by my interpretation.    COORDINATION OF CARE: 8:49 PM-Discussed treatment plan which includes administer dilaudid, Zofran, and prednisone. Pt agreed to plan.   Labs Review Labs Reviewed - No data to display Imaging Review No results found.  EKG Interpretation   None       MDM   1. Gout    Medicated.  Plan is DC, colchicine, NSAID, Pain meds.  I personally performed the services described in this documentation, which was scribed in my presence. The recorded information has been reviewed and is accurate.    Lebron Quam, MD 09/06/13 [phone]

## 2013-08-26 NOTE — ED Notes (Signed)
Continued gout flareup. Pain increasing and spreading to both legs

## 2014-03-18 ENCOUNTER — Emergency Department (HOSPITAL_COMMUNITY)
Admission: EM | Admit: 2014-03-18 | Discharge: 2014-03-18 | Disposition: A | Discharge status: Home / Self Care | Payer: BC Managed Care – PPO | Attending: Emergency Medicine | Admitting: Emergency Medicine

## 2014-03-18 ENCOUNTER — Encounter (HOSPITAL_COMMUNITY): Payer: Self-pay | Admitting: Emergency Medicine

## 2014-03-18 DIAGNOSIS — Z79899 Other long term (current) drug therapy: Secondary | ICD-10-CM | POA: Insufficient documentation

## 2014-03-18 DIAGNOSIS — M10072 Idiopathic gout, left ankle and foot: Secondary | ICD-10-CM

## 2014-03-18 DIAGNOSIS — I10 Essential (primary) hypertension: Secondary | ICD-10-CM | POA: Insufficient documentation

## 2014-03-18 DIAGNOSIS — M109 Gout, unspecified: Secondary | ICD-10-CM | POA: Insufficient documentation

## 2014-03-18 DIAGNOSIS — Z8673 Personal history of transient ischemic attack (TIA), and cerebral infarction without residual deficits: Secondary | ICD-10-CM | POA: Insufficient documentation

## 2014-03-18 MED ORDER — PREDNISONE (PAK) 10 MG PO TABS: ORAL_TABLET | Freq: Every day | ORAL | Status: DC

## 2014-03-18 MED ORDER — OXYCODONE-ACETAMINOPHEN 5-325 MG PO TABS: 1.0000 | ORAL_TABLET | ORAL | Status: DC | PRN

## 2014-03-18 MED ORDER — DEXAMETHASONE SODIUM PHOSPHATE 4 MG/ML IJ SOLN
8.0000 mg | Freq: Once | INTRAMUSCULAR | Status: AC
  Administered 2014-03-18: 8 mg via INTRAMUSCULAR
  Filled 2014-03-18: qty 2

## 2014-03-18 NOTE — ED Notes (Signed)
Lt foot pain due to gout Pt using crutches.

## 2014-03-18 NOTE — ED Notes (Signed)
Pt had shrimp from [employer] on Friday, left foot pain began on Sat., pt states hx of gout and usually shrimp and red meat causes gout for pt., pt states he has been taking Aleve but none today

## 2014-03-18 NOTE — Discharge Instructions (Signed)
Do not take the narcotic if you are driving as it will make you sleepy. Follow up with Dr. Gerarda Fraction or return here as needed.  Gout Gout is when your joints become red, sore, and swell (inflammed). This is caused by the buildup of uric acid crystals in the joints. Uric acid is a chemical that is normally in the blood. If the level of uric acid gets too high in the blood, these crystals form in your joints and tissues. Over time, these crystals can form into masses near the joints and tissues. These masses can destroy bone and cause the bone to look misshapen (deformed). HOME CARE   Do not take aspirin for pain.  Only take medicine as told by your doctor.  Rest the joint as much as you can. When in bed, keep sheets and blankets off painful areas.  Keep the sore joints raised (elevated).  Put warm or cold packs on painful joints. Use of warm or cold packs depends on which works best for you.  Use crutches if the painful joint is in your leg.  Drink enough fluids to keep your pee (urine) clear or pale yellow. Limit alcohol, sugary drinks, and drinks with fructose in them.  Follow your diet instructions. Pay careful attention to how much protein you eat. Include fruits, vegetables, whole grains, and fat-free or low-fat milk products in your daily diet. Talk to your doctor or dietician about the use of coffee, vitamin C, and cherries. These may help lower uric acid levels.  Keep a healthy body weight. GET HELP RIGHT AWAY IF:   You have watery poop (diarrhea), throw up (vomit), or have any side effects from medicines.  You do not feel better in 24 hours, or you are getting worse.  Your joint becomes suddenly more tender, and you have chills or a fever. MAKE SURE YOU:   Understand these instructions.  Will watch your condition.  Will get help right away if you are not doing well or get worse. Document Released: 06/15/2008 Document Revised: 01/01/2013 Document Reviewed: 12/15/2009 [facility]  Patient Information 2015 [place], [place]. This information is not intended to replace advice given to you by your health care provider. Make sure you discuss any questions you have with your health care provider.  Low-Purine Diet Purines are compounds that affect the level of uric acid in your body. A low-purine diet is a diet that is low in purines. Eating a low-purine diet can prevent the level of uric acid in your body from getting too high and causing gout or kidney stones or both. WHAT DO I NEED TO KNOW ABOUT THIS DIET?  Choose low-purine foods. Examples of low-purine foods are listed in the next section.  Drink plenty of fluids, especially water. Fluids can help remove uric acid from your body. Try to drink 8-16 cups (1.9-3.8 L) a day.  Limit foods high in fat, especially saturated fat, as fat makes it harder for the body to get rid of uric acid. Foods high in saturated fat include pizza, cheese, ice cream, whole milk, fried foods, and gravies. Choose foods that are lower in fat and lean sources of protein. Use olive oil when cooking as it contains healthy fats that are not high in saturated fat.  Limit alcohol. Alcohol interferes with the elimination of uric acid from your body. If you are having a gout attack, avoid all alcohol.  Keep in mind that different people's bodies react differently to different foods. You will probably learn over  time which foods do or do not affect you. If you discover that a food tends to cause your gout to flare up, avoid eating that food. You can more freely enjoy foods that do not cause problems. If you have any questions about a food item, talk to your dietitian or health care provider. WHICH FOODS ARE LOW, MODERATE, AND HIGH IN PURINES? The following is a list of foods that are low, moderate, and high in purines. You can eat any amount of the foods that are low in purines. You may be able to have small amounts of foods that are moderate in purines. Ask your  health care provider how much of a food moderate in purines you can have. Avoid foods high in purines. Grains  Foods low in purines: Enriched white bread, pasta, rice, cake, cornbread, popcorn.  Foods moderate in purines: Whole-grain breads and cereals, wheat germ, bran, oatmeal. Uncooked oatmeal. Dry wheat bran or wheat germ.  Foods high in purines: Pancakes, [place] toast, biscuits, muffins. Vegetables  Foods low in purines: All vegetables, except those that are moderate in purines.  Foods moderate in purines: Asparagus, cauliflower, spinach, mushrooms, green peas. Fruits  All fruits are low in purines. Meats and other Protein Foods  Foods low in purines: Eggs, nuts, peanut butter.  Foods moderate in purines: 80-90% lean beef, lamb, veal, pork, poultry, fish, eggs, peanut butter, nuts. Crab, lobster, oysters, and shrimp. Cooked dried beans, peas, and lentils.  Foods high in purines: Anchovies, sardines, herring, mussels, tuna, codfish, scallops, trout, and haddock. Berniece Salines. Organ meats (such as liver or kidney). Tripe. Game meat. Goose. Sweetbreads. Dairy  All dairy foods are low in purines. Low-fat and fat-free dairy products are best because they are low in saturated fat. Beverages  Drinks low in purines: Water, carbonated beverages, tea, coffee, cocoa.  Drinks moderate in purines: Soft drinks and other drinks sweetened with high-fructose corn syrup. Juices. To find whether a food or drink is sweetened with high-fructose corn syrup, look at the ingredients list.  Drinks high in purines: Alcoholic beverages (such as beer). Condiments  Foods low in purines: Salt, herbs, olives, pickles, relishes, vinegar.  Foods moderate in purines: Butter, margarine, oils, mayonnaise. Fats and Oils  Foods low in purines: All types, except gravies and sauces made with meat.  Foods high in purines: Gravies and sauces made with meat. Other Foods  Foods low in purines: Sugars, sweets,  gelatin. Cake. Soups made without meat.  Foods moderate in purines: Meat-based or fish-based soups, broths, or bouillons. Foods and drinks sweetened with high-fructose corn syrup.  Foods high in purines: High-fat desserts (such as ice cream, cookies, cakes, pies, doughnuts, and chocolate). Contact your dietitian for more information on foods that are not listed here. Document Released: 01/01/2011 Document Revised: 09/11/2013 Document Reviewed: 08/13/2013 [facility] Patient Information 2015 [place], [place]. This information is not intended to replace advice given to you by your health care provider. Make sure you discuss any questions you have with your health care provider.

## 2014-03-18 NOTE — ED Notes (Signed)
Pt verbalized understanding of no driving within 4 hours of taking percocet due to med causes drowsiness and also constipation

## 2014-03-18 NOTE — ED Provider Notes (Signed)
History/physical exam/procedure(s) were performed by non-physician practitioner and as supervising physician I was immediately available for consultation/collaboration. I have reviewed all notes and am in agreement with care and plan.   Shaune Pollack, MD 03/18/14 [phone]

## 2014-03-18 NOTE — ED Provider Notes (Signed)
CSN: [id number]     Arrival date & time 03/18/14  1252 History   First MD Initiated Contact with Patient 03/18/14 1604     Chief Complaint  Patient presents with  . Foot Pain     (Consider location/radiation/quality/duration/timing/severity/associated sxs/prior Treatment) Patient is a 28 y.o. male presenting with lower extremity pain. The history is provided by the patient.  Foot Pain This is a recurrent problem. The current episode started in the past 7 days. The problem occurs constantly. The problem has been gradually worsening.   EHAN COSNER is a 28 y.o. male who presents to the ED with left foot pain that started 3 days ago. He states that he has a history of gout and on Friday he ate shrimp and the pain started after that. The pain has gotten progressively worse despite taking Aleve, elevation and using crutches when ambulating. He rates his pain as 4/10 but feel that is it increasing. He states that a shot of cortisone and then a dose pack usually helps.    Past Medical History  Diagnosis Date  . Gout   . Hypertension   . Stroke   . Gout flare 11/25   History reviewed. No pertinent past surgical history. History reviewed. No pertinent family history. History  Substance Use Topics  . Smoking status: Never Smoker   . Smokeless tobacco: Not on file  . Alcohol Use: No    Review of Systems Negative except as stated in HPI   Allergies  Albuterol  Home Medications   Prior to Admission medications   Medication Sig Start Date End Date Taking? Authorizing Raven Furnas  amLODipine (NORVASC) 10 MG tablet Take 10 mg by mouth daily.   Yes Historical Ruthvik Barnaby, MD  carvedilol (COREG) 25 MG tablet Take 50 mg by mouth 2 (two) times daily with a meal.   Yes Historical Eoghan Belcher, MD   BP 163/112  Pulse 82  Temp(Src) 98 F (36.7 C) (Oral)  Resp 18  Ht 6\' 2"  (1.88 m)  Wt 311 lb (141.069 kg)  BMI 39.91 kg/m2  SpO2 98% Physical Exam  Nursing note and vitals  reviewed. Constitutional: He is oriented to person, place, and time. He appears well-developed and well-nourished.  HENT:  Head: Normocephalic.  Eyes: EOM are normal.  Neck: Neck supple.  Cardiovascular: Normal rate.   Pulmonary/Chest: Effort normal.  Abdominal: Soft. There is no tenderness.  Musculoskeletal: Normal range of motion.       Left foot: He exhibits tenderness. He exhibits normal range of motion, no swelling, normal capillary refill, no deformity and no laceration.       Feet:  Tenderness over the left heel with palpation.   Neurological: He is alert and oriented to person, place, and time. No cranial nerve deficit.  Skin: Skin is warm and dry.  Psychiatric: He has a normal mood and affect. His behavior is normal.    ED Course  Procedures Discussed with the patient that this could be gout but could also be a heel spur. Encouraged him to follow up with his PCP and if symptoms persist to consider x-ray of his foot. Patient states that for now he thinks it is gout because of eating the shrimp and that often sets off the symptoms. Decadron 8 mg IM  MDM  28 y.o. male with left heel pain and history of gout. Stable for discharge without neurovascular deficits. Discussed with the patient and all questioned fully answered. He will follow up with his PCP or return  here if any problems arise.    Medication List    TAKE these medications       oxyCODONE-acetaminophen 5-325 MG per tablet  Commonly known as:  ROXICET  Take 1 tablet by mouth every 4 (four) hours as needed for severe pain.     predniSONE 10 MG tablet  Commonly known as:  STERAPRED UNI-PAK  Take by mouth daily. Starting 6/30 take 6 tablets PO then 5, 4, 3, 2,1      ASK your doctor about these medications       amLODipine 10 MG tablet  Commonly known as:  NORVASC  Take 10 mg by mouth daily.     carvedilol 25 MG tablet  Commonly known as:  COREG  Take 50 mg by mouth 2 (two) times daily with a meal.            Ashley Murrain, NP 03/18/14 1622

## 2014-08-19 ENCOUNTER — Emergency Department (HOSPITAL_COMMUNITY)
Admission: EM | Admit: 2014-08-19 | Discharge: 2014-08-19 | Disposition: A | Discharge status: Home / Self Care | Payer: BC Managed Care – PPO | Attending: Emergency Medicine | Admitting: Emergency Medicine

## 2014-08-19 ENCOUNTER — Encounter (HOSPITAL_COMMUNITY): Payer: Self-pay | Admitting: *Deleted

## 2014-08-19 DIAGNOSIS — I10 Essential (primary) hypertension: Secondary | ICD-10-CM | POA: Diagnosis not present

## 2014-08-19 DIAGNOSIS — Z8673 Personal history of transient ischemic attack (TIA), and cerebral infarction without residual deficits: Secondary | ICD-10-CM | POA: Insufficient documentation

## 2014-08-19 DIAGNOSIS — R42 Dizziness and giddiness: Secondary | ICD-10-CM | POA: Diagnosis not present

## 2014-08-19 DIAGNOSIS — Z79899 Other long term (current) drug therapy: Secondary | ICD-10-CM | POA: Diagnosis not present

## 2014-08-19 DIAGNOSIS — Z8739 Personal history of other diseases of the musculoskeletal system and connective tissue: Secondary | ICD-10-CM | POA: Insufficient documentation

## 2014-08-19 DIAGNOSIS — Z7952 Long term (current) use of systemic steroids: Secondary | ICD-10-CM | POA: Diagnosis not present

## 2014-08-19 LAB — CBC WITH DIFFERENTIAL/PLATELET
BASOS ABS: 0 10*3/uL (ref 0.0–0.1)
Basophils Relative: 0 % (ref 0–1)
Eosinophils Absolute: 0.1 10*3/uL (ref 0.0–0.7)
Eosinophils Relative: 1 % (ref 0–5)
HEMATOCRIT: 50.2 % (ref 39.0–52.0)
HEMOGLOBIN: 17.2 g/dL — AB (ref 13.0–17.0)
LYMPHS PCT: 34 % (ref 12–46)
Lymphs Abs: 3 10*3/uL (ref 0.7–4.0)
MCH: 30.8 pg (ref 26.0–34.0)
MCHC: 34.3 g/dL (ref 30.0–36.0)
MCV: 89.8 fL (ref 78.0–100.0)
MONO ABS: 0.5 10*3/uL (ref 0.1–1.0)
Monocytes Relative: 5 % (ref 3–12)
NEUTROS ABS: 5.4 10*3/uL (ref 1.7–7.7)
NEUTROS PCT: 60 % (ref 43–77)
Platelets: 265 10*3/uL (ref 150–400)
RBC: 5.59 MIL/uL (ref 4.22–5.81)
RDW: 15 % (ref 11.5–15.5)
WBC: 9 10*3/uL (ref 4.0–10.5)

## 2014-08-19 LAB — BASIC METABOLIC PANEL
ANION GAP: 12 (ref 5–15)
BUN: 20 mg/dL (ref 6–23)
CALCIUM: 9.1 mg/dL (ref 8.4–10.5)
CHLORIDE: 103 meq/L (ref 96–112)
CO2: 27 meq/L (ref 19–32)
Creatinine, Ser: 2.35 mg/dL — ABNORMAL HIGH (ref 0.50–1.35)
GFR calc Af Amer: 41 mL/min — ABNORMAL LOW (ref >90)
GFR calc non Af Amer: 35 mL/min — ABNORMAL LOW (ref >90)
Glucose, Bld: 89 mg/dL (ref 70–99)
Potassium: 3.7 mEq/L (ref 3.7–5.3)
SODIUM: 142 meq/L (ref 137–147)

## 2014-08-19 MED ORDER — AMLODIPINE BESYLATE 5 MG PO TABS
10.0000 mg | ORAL_TABLET | Freq: Once | ORAL | Status: AC
  Administered 2014-08-19: 10 mg via ORAL
  Filled 2014-08-19: qty 2

## 2014-08-19 MED ORDER — AMLODIPINE BESYLATE 10 MG PO TABS: 10.0000 mg | ORAL_TABLET | Freq: Every day | ORAL | Status: DC

## 2014-08-19 NOTE — Discharge Instructions (Signed)
Blood pressure is elevated close follow-up with your doctor will be important. Your Norvasc dosing is maxed out. The cor regular can take 25 mg twice a day instead of once a day. Would recommend doing that. Make appointment to follow-up with your doctor. Return for any significant headache chest pain shortness of breath or any strokelike symptoms. Lab studies without significant changes from 2010. He do have renal insufficiency but it isn't significantly changed over the past 5 years.

## 2014-08-19 NOTE — ED Notes (Signed)
Pt states his blood pressure was 220's/100's last night. Attempted to call PCP office but was unable to reach anyone. States this has been intermittent x 3 weeks with intermittent headache also. NAD at this time.

## 2014-08-19 NOTE — ED Provider Notes (Signed)
CSN: [id number]     Arrival date & time 08/19/14  1200 History   First MD Initiated Contact with Patient 08/19/14 1604     Chief Complaint  Patient presents with  . Hypertension     (Consider location/radiation/quality/duration/timing/severity/associated sxs/prior Treatment) Patient is a 28 y.o. male presenting with hypertension. The history is provided by the patient.  Hypertension Pertinent negatives include no chest pain, no abdominal pain, no headaches and no shortness of breath.   patient with long-standing history of hypertension. Running higher than usual. Ran out of his Norvasc. Patient is also on Coreg. Patient's blood pressures been running high for the past 3 weeks. Nurses wrote intermittent headache the patient denied headache to me. Talked about having some dizziness 2 days ago but it has resolved none since. No chest pain no shortness of breath no strokelike symptoms. The dizziness was more like some vertigo but as stated has not had any for the past 2 days. Patient is followed by [facility]. Still has his Coreg did not take any doses today. Ran out of his Norvasc. Needs a renewal of his Norvasc medicine.  Past Medical History  Diagnosis Date  . Gout   . Hypertension   . Stroke   . Gout flare 11/25   History reviewed. No pertinent past surgical history. No family history on file. History  Substance Use Topics  . Smoking status: Never Smoker   . Smokeless tobacco: Not on file  . Alcohol Use: No    Review of Systems  Constitutional: Negative for fever.  HENT: Negative for congestion.   Eyes: Negative for visual disturbance.  Respiratory: Negative for shortness of breath.   Cardiovascular: Negative for chest pain.  Gastrointestinal: Negative for nausea, vomiting and abdominal pain.  Genitourinary: Negative for dysuria.  Musculoskeletal: Negative for back pain and neck pain.  Skin: Negative for rash.  Neurological: Positive for dizziness. Negative for  weakness, numbness and headaches.  Hematological: Does not bruise/bleed easily.  Psychiatric/Behavioral: Negative for confusion.      Allergies  Albuterol  Home Medications   Prior to Admission medications   Medication Sig Start Date End Date Taking? Authorizing Provider  amLODipine (NORVASC) 10 MG tablet Take 10 mg by mouth at bedtime.    Yes Historical Provider, MD  carvedilol (COREG) 25 MG tablet Take 50 mg by mouth 2 (two) times daily with a meal.   Yes Historical Provider, MD  oxyCODONE-acetaminophen (ROXICET) 5-325 MG per tablet Take 1 tablet by mouth every 4 (four) hours as needed for severe pain. Patient not taking: Reported on 08/19/2014 03/18/14   Ashley Murrain, NP  predniSONE (STERAPRED UNI-PAK) 10 MG tablet Take by mouth daily. Starting 6/30 take 6 tablets PO then 5, 4, 3, 2,1 Patient not taking: Reported on 08/19/2014 03/18/14   Ashley Murrain, NP   BP 181/130 mmHg  Pulse 70  Temp(Src) 97.9 F (36.6 C) (Oral)  Resp 18  Ht 6\' 1"  (1.854 m)  Wt 316 lb 14.4 oz (143.745 kg)  BMI 41.82 kg/m2  SpO2 96% Physical Exam  Constitutional: He is oriented to person, place, and time. He appears well-developed and well-nourished. No distress.  HENT:  Head: Normocephalic and atraumatic.  Mouth/Throat: Oropharynx is clear and moist.  Eyes: Conjunctivae and EOM are normal. Pupils are equal, round, and reactive to light.  Neck: Normal range of motion. Neck supple.  Cardiovascular: Normal rate, regular rhythm and normal heart sounds.   No murmur heard. Pulmonary/Chest: Effort normal and breath sounds  normal. No respiratory distress.  Abdominal: Soft. Bowel sounds are normal. There is no tenderness.  Musculoskeletal: Normal range of motion. He exhibits no edema.  Neurological: He is alert and oriented to person, place, and time. No cranial nerve deficit. He exhibits normal muscle tone. Coordination normal.  Skin: Skin is warm. No rash noted.  Nursing note and vitals reviewed.   ED  Course  Procedures (including critical care time) Labs Review Labs Reviewed  BASIC METABOLIC PANEL - Abnormal; Notable for the following:    Creatinine, Ser 2.35 (*)    GFR calc non Af Amer 35 (*)    GFR calc Af Amer 41 (*)    All other components within normal limits  CBC WITH DIFFERENTIAL - Abnormal; Notable for the following:    Hemoglobin 17.2 (*)    All other components within normal limits   Results for orders placed or performed during the hospital encounter of [id number]  Basic metabolic panel  Result Value Ref Range   Sodium 142 137 - 147 mEq/L   Potassium 3.7 3.7 - 5.3 mEq/L   Chloride 103 96 - 112 mEq/L   CO2 27 19 - 32 mEq/L   Glucose, Bld 89 70 - 99 mg/dL   BUN 20 6 - 23 mg/dL   Creatinine, Ser 2.35 (H) 0.50 - 1.35 mg/dL   Calcium 9.1 8.4 - 10.5 mg/dL   GFR calc non Af Amer 35 (L) >90 mL/min   GFR calc Af Amer 41 (L) >90 mL/min   Anion gap 12 5 - 15  CBC with Differential  Result Value Ref Range   WBC 9.0 4.0 - 10.5 K/uL   RBC 5.59 4.22 - 5.81 MIL/uL   Hemoglobin 17.2 (H) 13.0 - 17.0 g/dL   HCT 50.2 39.0 - 52.0 %   MCV 89.8 78.0 - 100.0 fL   MCH 30.8 26.0 - 34.0 pg   MCHC 34.3 30.0 - 36.0 g/dL   RDW 15.0 11.5 - 15.5 %   Platelets 265 150 - 400 K/uL   Neutrophils Relative % 60 43 - 77 %   Neutro Abs 5.4 1.7 - 7.7 K/uL   Lymphocytes Relative 34 12 - 46 %   Lymphs Abs 3.0 0.7 - 4.0 K/uL   Monocytes Relative 5 3 - 12 %   Monocytes Absolute 0.5 0.1 - 1.0 K/uL   Eosinophils Relative 1 0 - 5 %   Eosinophils Absolute 0.1 0.0 - 0.7 K/uL   Basophils Relative 0 0 - 1 %   Basophils Absolute 0.0 0.0 - 0.1 K/uL     Imaging Review No results found.   EKG Interpretation None      MDM   Final diagnoses:  Essential hypertension    Patient is on Norvasc and Coreg. Has not had his Coreg today. I thought the patient was lighting the Coreg once a day but he is already taking it twice a day and actually is 50 mg twice a day. Probably does not have much room for  increasing current meds. He will follow his blood pressure follow-up with his regular doctor is CF getting back on the usual meds helps stabilize his blood pressure. Patient without any evidence of end organ damage of significance here today. Not a hypertensive crisis. Renal function although abnormal is not significant changed over the past 5 years.    Fredia Sorrow, MD 08/19/14 1818

## 2014-09-12 ENCOUNTER — Encounter (HOSPITAL_COMMUNITY): Payer: Self-pay | Admitting: *Deleted

## 2014-09-12 ENCOUNTER — Emergency Department (HOSPITAL_COMMUNITY)
Admission: EM | Admit: 2014-09-12 | Discharge: 2014-09-13 | Disposition: A | Discharge status: Home / Self Care | Payer: BC Managed Care – PPO | Attending: Emergency Medicine | Admitting: Emergency Medicine

## 2014-09-12 DIAGNOSIS — Z8673 Personal history of transient ischemic attack (TIA), and cerebral infarction without residual deficits: Secondary | ICD-10-CM | POA: Diagnosis not present

## 2014-09-12 DIAGNOSIS — I10 Essential (primary) hypertension: Secondary | ICD-10-CM | POA: Diagnosis not present

## 2014-09-12 DIAGNOSIS — Z79899 Other long term (current) drug therapy: Secondary | ICD-10-CM | POA: Diagnosis not present

## 2014-09-12 DIAGNOSIS — M109 Gout, unspecified: Secondary | ICD-10-CM | POA: Diagnosis present

## 2014-09-12 DIAGNOSIS — M10071 Idiopathic gout, right ankle and foot: Secondary | ICD-10-CM

## 2014-09-12 MED ORDER — DEXAMETHASONE SODIUM PHOSPHATE 10 MG/ML IJ SOLN
10.0000 mg | Freq: Once | INTRAMUSCULAR | Status: AC
  Administered 2014-09-13: 10 mg via INTRAMUSCULAR
  Filled 2014-09-12: qty 1

## 2014-09-12 MED ORDER — PREDNISONE 10 MG PO TABS
60.0000 mg | ORAL_TABLET | Freq: Once | ORAL | Status: AC
  Administered 2014-09-13: 60 mg via ORAL
  Filled 2014-09-12 (×2): qty 1

## 2014-09-12 NOTE — ED Notes (Signed)
Pt reporting increased pain from gout in right foot.  States that he hasn't been able to see his PCP this week due to him being out of the office.

## 2014-09-13 MED ORDER — PREDNISONE 10 MG PO TABS: ORAL_TABLET | ORAL | Status: DC

## 2014-09-13 NOTE — Discharge Instructions (Signed)
Your blood pressure is elevated. This will need follow-up. You will need medication. Follow-up with your primary care doctor. Prescription for prednisone

## 2014-09-13 NOTE — ED Provider Notes (Signed)
CSN: [id number]     Arrival date & time 09/12/14  2314 History   First MD Initiated Contact with Patient 09/12/14 2339     Chief Complaint  Patient presents with  . Gout     (Consider location/radiation/quality/duration/timing/severity/associated sxs/prior Treatment) HPI... Patient presents with pain in his right ankle and foot.  He has a known history of gout and he feels this is the same. He has been on colchicine for several days. Prednisone usually helps symptoms. No fever or chills. No other joint pain. Severity is moderate.  Past Medical History  Diagnosis Date  . Gout   . Hypertension   . Stroke   . Gout flare 11/25   History reviewed. No pertinent past surgical history. History reviewed. No pertinent family history. History  Substance Use Topics  . Smoking status: Never Smoker   . Smokeless tobacco: Not on file  . Alcohol Use: No    Review of Systems  All other systems reviewed and are negative.     Allergies  Albuterol  Home Medications   Prior to Admission medications   Medication Sig Start Date End Date Taking? Authorizing Provider  colchicine 0.6 MG tablet Take 0.6 mg by mouth daily.   Yes Historical Provider, MD  amLODipine (NORVASC) 10 MG tablet Take 1 tablet (10 mg total) by mouth daily. 08/19/14   Fredia Sorrow, MD  carvedilol (COREG) 25 MG tablet Take 50 mg by mouth 2 (two) times daily with a meal.    Historical Provider, MD  oxyCODONE-acetaminophen (ROXICET) 5-325 MG per tablet Take 1 tablet by mouth every 4 (four) hours as needed for severe pain. Patient not taking: Reported on 08/19/2014 03/18/14   Ashley Murrain, NP  predniSONE (DELTASONE) 10 MG tablet 5 tablets for 3 days, 4 tablets for 2 days, 3 tablets for 2 days, 2 tablets for 2 days, one tablet for 2 days 09/13/14   Nat Christen, MD   BP 212/136 mmHg  Pulse 99  Resp 27  Ht 6\' 4"  (1.93 m)  Wt 325 lb (147.419 kg)  BMI 39.58 kg/m2  SpO2 98% Physical Exam  Constitutional: He is oriented to  person, place, and time. He appears well-developed and well-nourished.  HENT:  Head: Normocephalic.  Musculoskeletal:  Right lower extremity: Right ankle and foot are tender and puffy. Pain with range of motion  Neurological: He is alert and oriented to person, place, and time.  Skin: Skin is warm and dry.  Psychiatric: He has a normal mood and affect. His behavior is normal.    ED Course  Procedures (including critical care time) Labs Review Labs Reviewed - No data to display  Imaging Review No results found.   EKG Interpretation None      MDM   Final diagnoses:  Acute idiopathic gout of right ankle    Patient has a known history of gout. IM Decadron.  Discharge home on prednisone taper. Elevated blood pressure noted and discussed with patient.    Nat Christen, MD 09/13/14 [phone]

## 2015-03-21 DIAGNOSIS — J384 Edema of larynx: Secondary | ICD-10-CM | POA: Insufficient documentation

## 2015-03-21 DIAGNOSIS — R490 Dysphonia: Secondary | ICD-10-CM | POA: Insufficient documentation

## 2015-07-31 ENCOUNTER — Encounter: Payer: Self-pay | Admitting: Cardiology

## 2015-07-31 ENCOUNTER — Ambulatory Visit (INDEPENDENT_AMBULATORY_CARE_PROVIDER_SITE_OTHER): Payer: BLUE CROSS/BLUE SHIELD | Admitting: Cardiology

## 2015-07-31 VITALS — BP 190/148 | HR 61 | Ht 74.0 in | Wt 311.0 lb

## 2015-07-31 DIAGNOSIS — Z8673 Personal history of transient ischemic attack (TIA), and cerebral infarction without residual deficits: Secondary | ICD-10-CM

## 2015-07-31 DIAGNOSIS — N183 Chronic kidney disease, stage 3 unspecified: Secondary | ICD-10-CM

## 2015-07-31 DIAGNOSIS — I10 Essential (primary) hypertension: Secondary | ICD-10-CM | POA: Diagnosis not present

## 2015-07-31 DIAGNOSIS — I429 Cardiomyopathy, unspecified: Secondary | ICD-10-CM | POA: Diagnosis not present

## 2015-07-31 NOTE — Patient Instructions (Signed)
Your physician recommends that you schedule a follow-up appointment in:  2 weeks with Dr Domenic Polite    Take BP twice a day for next 2 weeks    Your physician has requested that you have an echocardiogram. Echocardiography is a painless test that uses sound waves to create images of your heart. It provides your doctor with information about the size and shape of your heart and how well your heart's chambers and valves are working. This procedure takes approximately one hour. There are no restrictions for this procedure.    Thank you for choosing [facility] !
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
## 2015-08-01 ENCOUNTER — Ambulatory Visit (HOSPITAL_COMMUNITY)
Admission: RE | Admit: 2015-08-01 | Discharge: 2015-08-01 | Disposition: A | Discharge status: Home / Self Care | Payer: BLUE CROSS/BLUE SHIELD | Source: Ambulatory Visit | Attending: Cardiology | Admitting: Cardiology

## 2015-08-01 DIAGNOSIS — I10 Essential (primary) hypertension: Secondary | ICD-10-CM | POA: Diagnosis not present

## 2015-08-01 DIAGNOSIS — I429 Cardiomyopathy, unspecified: Secondary | ICD-10-CM | POA: Insufficient documentation

## 2015-08-18 ENCOUNTER — Encounter: Payer: Self-pay | Admitting: *Deleted

## 2015-08-18 ENCOUNTER — Ambulatory Visit (INDEPENDENT_AMBULATORY_CARE_PROVIDER_SITE_OTHER): Payer: BLUE CROSS/BLUE SHIELD | Admitting: Cardiology

## 2015-08-18 ENCOUNTER — Encounter: Payer: Self-pay | Admitting: Cardiology

## 2015-08-18 VITALS — BP 146/100 | HR 84 | Ht 73.0 in | Wt 306.0 lb

## 2015-08-18 DIAGNOSIS — I429 Cardiomyopathy, unspecified: Secondary | ICD-10-CM

## 2015-08-18 DIAGNOSIS — N183 Chronic kidney disease, stage 3 unspecified: Secondary | ICD-10-CM

## 2015-08-18 DIAGNOSIS — I10 Essential (primary) hypertension: Secondary | ICD-10-CM | POA: Diagnosis not present

## 2015-08-18 MED ORDER — HYDRALAZINE HCL 25 MG PO TABS: 25.0000 mg | ORAL_TABLET | Freq: Three times a day (TID) | ORAL | Status: DC

## 2015-08-18 MED ORDER — LOSARTAN POTASSIUM 50 MG PO TABS: 50.0000 mg | ORAL_TABLET | Freq: Every day | ORAL | Status: DC

## 2015-08-18 MED ORDER — ISOSORBIDE MONONITRATE ER 30 MG PO TB24: 30.0000 mg | ORAL_TABLET | Freq: Every day | ORAL | Status: DC

## 2015-08-18 NOTE — Patient Instructions (Addendum)
Your physician recommends that you schedule a follow-up appointment in: 6 week with Dr Domenic Polite    DECREASE Cozaar to 50 mg daily   START Hydralazine 25 mg three times a day   START Imdur 30 mg at night    Get lab work :BMET JUST BEFORE F/U visit     If you need a refill on your cardiac medications before your next appointment, please call your pharmacy.       Thank you for choosing [facility] !
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
## 2015-10-06 ENCOUNTER — Ambulatory Visit: Payer: BLUE CROSS/BLUE SHIELD | Admitting: Cardiology

## 2015-10-10 ENCOUNTER — Encounter (INDEPENDENT_AMBULATORY_CARE_PROVIDER_SITE_OTHER): Payer: BLUE CROSS/BLUE SHIELD | Admitting: Cardiology

## 2015-10-10 ENCOUNTER — Encounter: Payer: Self-pay | Admitting: Cardiology

## 2015-10-10 DIAGNOSIS — Z136 Encounter for screening for cardiovascular disorders: Secondary | ICD-10-CM

## 2015-10-10 NOTE — Progress Notes (Signed)
No show  This encounter was created in error - please disregard.

## 2015-11-13 ENCOUNTER — Other Ambulatory Visit: Payer: Self-pay | Admitting: Nephrology

## 2015-11-13 DIAGNOSIS — N183 Chronic kidney disease, stage 3 unspecified: Secondary | ICD-10-CM

## 2015-11-21 ENCOUNTER — Ambulatory Visit
Admission: RE | Admit: 2015-11-21 | Discharge: 2015-11-21 | Disposition: A | Discharge status: Home / Self Care | Payer: BLUE CROSS/BLUE SHIELD | Source: Ambulatory Visit | Attending: Nephrology | Admitting: Nephrology

## 2015-11-21 DIAGNOSIS — N183 Chronic kidney disease, stage 3 unspecified: Secondary | ICD-10-CM

## 2016-01-31 ENCOUNTER — Encounter (HOSPITAL_COMMUNITY): Payer: Self-pay

## 2016-01-31 ENCOUNTER — Emergency Department (HOSPITAL_COMMUNITY)
Admission: EM | Admit: 2016-01-31 | Discharge: 2016-01-31 | Disposition: A | Discharge status: Home / Self Care | Payer: BLUE CROSS/BLUE SHIELD | Attending: Emergency Medicine | Admitting: Emergency Medicine

## 2016-01-31 DIAGNOSIS — M109 Gout, unspecified: Secondary | ICD-10-CM | POA: Insufficient documentation

## 2016-01-31 DIAGNOSIS — Z79899 Other long term (current) drug therapy: Secondary | ICD-10-CM | POA: Insufficient documentation

## 2016-01-31 DIAGNOSIS — I129 Hypertensive chronic kidney disease with stage 1 through stage 4 chronic kidney disease, or unspecified chronic kidney disease: Secondary | ICD-10-CM | POA: Insufficient documentation

## 2016-01-31 DIAGNOSIS — M25572 Pain in left ankle and joints of left foot: Secondary | ICD-10-CM | POA: Diagnosis not present

## 2016-01-31 DIAGNOSIS — N183 Chronic kidney disease, stage 3 (moderate): Secondary | ICD-10-CM | POA: Insufficient documentation

## 2016-01-31 MED ORDER — DEXAMETHASONE SODIUM PHOSPHATE 10 MG/ML IJ SOLN
10.0000 mg | Freq: Once | INTRAMUSCULAR | Status: AC
  Administered 2016-01-31: 10 mg via INTRAMUSCULAR
  Filled 2016-01-31: qty 1

## 2016-01-31 MED ORDER — PREDNISONE 20 MG PO TABS: ORAL_TABLET | ORAL | Status: DC

## 2016-01-31 NOTE — ED Notes (Signed)
Pain in left foot, states is consistent with his gout flares

## 2016-01-31 NOTE — Discharge Instructions (Signed)
Elevate your foot. Use heat for comfort. Take the prednisone until gone. Use your crutches until you are able to ambulate without them. Look at the low purine diet again to make sure you aren't eating something that could make your gout worse.  Recheck if you get worse.    Low-Purine Diet Purines are compounds that affect the level of uric acid in your body. A low-purine diet is a diet that is low in purines. Eating a low-purine diet can prevent the level of uric acid in your body from getting too high and causing gout or kidney stones or both. WHAT DO I NEED TO KNOW ABOUT THIS DIET?  Choose low-purine foods. Examples of low-purine foods are listed in the next section.  Drink plenty of fluids, especially water. Fluids can help remove uric acid from your body. Try to drink 8-16 cups (1.9-3.8 L) a day.  Limit foods high in fat, especially saturated fat, as fat makes it harder for the body to get rid of uric acid. Foods high in saturated fat include pizza, cheese, ice cream, whole milk, fried foods, and gravies. Choose foods that are lower in fat and lean sources of protein. Use olive oil when cooking as it contains healthy fats that are not high in saturated fat.  Limit alcohol. Alcohol interferes with the elimination of uric acid from your body. If you are having a gout attack, avoid all alcohol.  Keep in mind that different people's bodies react differently to different foods. You will probably learn over time which foods do or do not affect you. If you discover that a food tends to cause your gout to flare up, avoid eating that food. You can more freely enjoy foods that do not cause problems. If you have any questions about a food item, talk to your dietitian or health care provider. WHICH FOODS ARE LOW, MODERATE, AND HIGH IN PURINES? The following is a list of foods that are low, moderate, and high in purines. You can eat any amount of the foods that are low in purines. You may be able to have  small amounts of foods that are moderate in purines. Ask your health care provider how much of a food moderate in purines you can have. Avoid foods high in purines. Grains  Foods low in purines: Enriched white bread, pasta, rice, cake, cornbread, popcorn.  Foods moderate in purines: Whole-grain breads and cereals, wheat germ, bran, oatmeal. Uncooked oatmeal. Dry wheat bran or wheat germ.  Foods high in purines: Pancakes, [place] toast, biscuits, muffins. Vegetables  Foods low in purines: All vegetables, except those that are moderate in purines.  Foods moderate in purines: Asparagus, cauliflower, spinach, mushrooms, green peas. Fruits  All fruits are low in purines. Meats and other Protein Foods  Foods low in purines: Eggs, nuts, peanut butter.  Foods moderate in purines: 80-90% lean beef, lamb, veal, pork, poultry, fish, eggs, peanut butter, nuts. Crab, lobster, oysters, and shrimp. Cooked dried beans, peas, and lentils.  Foods high in purines: Anchovies, sardines, herring, mussels, tuna, codfish, scallops, trout, and haddock. Berniece Salines. Organ meats (such as liver or kidney). Tripe. Game meat. Goose. Sweetbreads. Dairy  All dairy foods are low in purines. Low-fat and fat-free dairy products are best because they are low in saturated fat. Beverages  Drinks low in purines: Water, carbonated beverages, tea, coffee, cocoa.  Drinks moderate in purines: Soft drinks and other drinks sweetened with high-fructose corn syrup. Juices. To find whether a food or drink is sweetened  with high-fructose corn syrup, look at the ingredients list.  Drinks high in purines: Alcoholic beverages (such as beer). Condiments  Foods low in purines: Salt, herbs, olives, pickles, relishes, vinegar.  Foods moderate in purines: Butter, margarine, oils, mayonnaise. Fats and Oils  Foods low in purines: All types, except gravies and sauces made with meat.  Foods high in purines: Gravies and sauces made with  meat. Other Foods  Foods low in purines: Sugars, sweets, gelatin. Cake. Soups made without meat.  Foods moderate in purines: Meat-based or fish-based soups, broths, or bouillons. Foods and drinks sweetened with high-fructose corn syrup.  Foods high in purines: High-fat desserts (such as ice cream, cookies, cakes, pies, doughnuts, and chocolate). Contact your dietitian for more information on foods that are not listed here.   This information is not intended to replace advice given to you by your health care provider. Make sure you discuss any questions you have with your health care provider.   Document Released: 01/01/2011 Document Revised: 09/11/2013 Document Reviewed: 08/13/2013 Elsevier Interactive Patient Education 2016 [employer].  Gout Gout is an inflammatory arthritis caused by a buildup of uric acid crystals in the joints. Uric acid is a chemical that is normally present in the blood. When the level of uric acid in the blood is too high it can form crystals that deposit in your joints and tissues. This causes joint redness, soreness, and swelling (inflammation). Repeat attacks are common. Over time, uric acid crystals can form into masses (tophi) near a joint, destroying bone and causing disfigurement. Gout is treatable and often preventable. CAUSES  The disease begins with elevated levels of uric acid in the blood. Uric acid is produced by your body when it breaks down a naturally found substance called purines. Certain foods you eat, such as meats and fish, contain high amounts of purines. Causes of an elevated uric acid level include:  Being passed down from parent to child (heredity).  Diseases that cause increased uric acid production (such as obesity, psoriasis, and certain cancers).  Excessive alcohol use.  Diet, especially diets rich in meat and seafood.  Medicines, including certain cancer-fighting medicines (chemotherapy), water pills (diuretics), and  aspirin.  Chronic kidney disease. The kidneys are no longer able to remove uric acid well.  Problems with metabolism. Conditions strongly associated with gout include:  Obesity.  High blood pressure.  High cholesterol.  Diabetes. Not everyone with elevated uric acid levels gets gout. It is not understood why some people get gout and others do not. Surgery, joint injury, and eating too much of certain foods are some of the factors that can lead to gout attacks. SYMPTOMS   An attack of gout comes on quickly. It causes intense pain with redness, swelling, and warmth in a joint.  Fever can occur.  Often, only one joint is involved. Certain joints are more commonly involved:  Base of the big toe.  Knee.  Ankle.  Wrist.  Finger. Without treatment, an attack usually goes away in a few days to weeks. Between attacks, you usually will not have symptoms, which is different from many other forms of arthritis. DIAGNOSIS  Your caregiver will suspect gout based on your symptoms and exam. In some cases, tests may be recommended. The tests may include:  Blood tests.  Urine tests.  X-rays.  Joint fluid exam. This exam requires a needle to remove fluid from the joint (arthrocentesis). Using a microscope, gout is confirmed when uric acid crystals are seen in the joint  fluid. TREATMENT  There are two phases to gout treatment: treating the sudden onset (acute) attack and preventing attacks (prophylaxis).  Treatment of an Acute Attack.  Medicines are used. These include anti-inflammatory medicines or steroid medicines.  An injection of steroid medicine into the affected joint is sometimes necessary.  The painful joint is rested. Movement can worsen the arthritis.  You may use warm or cold treatments on painful joints, depending which works best for you.  Treatment to Prevent Attacks.  If you suffer from frequent gout attacks, your caregiver may advise preventive medicine. These  medicines are started after the acute attack subsides. These medicines either help your kidneys eliminate uric acid from your body or decrease your uric acid production. You may need to stay on these medicines for a very long time.  The early phase of treatment with preventive medicine can be associated with an increase in acute gout attacks. For this reason, during the first few months of treatment, your caregiver may also advise you to take medicines usually used for acute gout treatment. Be sure you understand your caregiver's directions. Your caregiver may make several adjustments to your medicine dose before these medicines are effective.  Discuss dietary treatment with your caregiver or dietitian. Alcohol and drinks high in sugar and fructose and foods such as meat, poultry, and seafood can increase uric acid levels. Your caregiver or dietitian can advise you on drinks and foods that should be limited. HOME CARE INSTRUCTIONS   Do not take aspirin to relieve pain. This raises uric acid levels.  Only take over-the-counter or prescription medicines for pain, discomfort, or fever as directed by your caregiver.  Rest the joint as much as possible. When in bed, keep sheets and blankets off painful areas.  Keep the affected joint raised (elevated).  Apply warm or cold treatments to painful joints. Use of warm or cold treatments depends on which works best for you.  Use crutches if the painful joint is in your leg.  Drink enough fluids to keep your urine clear or pale yellow. This helps your body get rid of uric acid. Limit alcohol, sugary drinks, and fructose drinks.  Follow your dietary instructions. Pay careful attention to the amount of protein you eat. Your daily diet should emphasize fruits, vegetables, whole grains, and fat-free or low-fat milk products. Discuss the use of coffee, vitamin C, and cherries with your caregiver or dietitian. These may be helpful in lowering uric acid  levels.  Maintain a healthy body weight. SEEK MEDICAL CARE IF:   You develop diarrhea, vomiting, or any side effects from medicines.  You do not feel better in 24 hours, or you are getting worse. SEEK IMMEDIATE MEDICAL CARE IF:   Your joint becomes suddenly more tender, and you have chills or a fever. MAKE SURE YOU:   Understand these instructions.  Will watch your condition.  Will get help right away if you are not doing well or get worse.   This information is not intended to replace advice given to you by your health care provider. Make sure you discuss any questions you have with your health care provider.   Document Released: 09/03/2000 Document Revised: 09/27/2014 Document Reviewed: 04/19/2012 Elsevier Interactive Patient Education [employer].
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
## 2016-04-08 ENCOUNTER — Other Ambulatory Visit (HOSPITAL_COMMUNITY): Payer: Self-pay | Admitting: Internal Medicine

## 2016-04-08 ENCOUNTER — Ambulatory Visit (HOSPITAL_COMMUNITY): Admission: RE | Admit: 2016-04-08 | Payer: BLUE CROSS/BLUE SHIELD | Source: Ambulatory Visit

## 2016-04-08 DIAGNOSIS — I159 Secondary hypertension, unspecified: Secondary | ICD-10-CM

## 2016-06-30 ENCOUNTER — Emergency Department (HOSPITAL_COMMUNITY)
Admission: EM | Admit: 2016-06-30 | Discharge: 2016-06-30 | Disposition: A | Discharge status: Home / Self Care | Payer: Commercial Managed Care - HMO | Attending: Emergency Medicine | Admitting: Emergency Medicine

## 2016-06-30 ENCOUNTER — Encounter (HOSPITAL_COMMUNITY): Payer: Self-pay | Admitting: *Deleted

## 2016-06-30 DIAGNOSIS — I129 Hypertensive chronic kidney disease with stage 1 through stage 4 chronic kidney disease, or unspecified chronic kidney disease: Secondary | ICD-10-CM | POA: Diagnosis not present

## 2016-06-30 DIAGNOSIS — Z79899 Other long term (current) drug therapy: Secondary | ICD-10-CM | POA: Diagnosis not present

## 2016-06-30 DIAGNOSIS — Y999 Unspecified external cause status: Secondary | ICD-10-CM | POA: Diagnosis not present

## 2016-06-30 DIAGNOSIS — R51 Headache: Secondary | ICD-10-CM | POA: Diagnosis not present

## 2016-06-30 DIAGNOSIS — Y9389 Activity, other specified: Secondary | ICD-10-CM | POA: Insufficient documentation

## 2016-06-30 DIAGNOSIS — M542 Cervicalgia: Secondary | ICD-10-CM | POA: Diagnosis not present

## 2016-06-30 DIAGNOSIS — N183 Chronic kidney disease, stage 3 (moderate): Secondary | ICD-10-CM | POA: Insufficient documentation

## 2016-06-30 DIAGNOSIS — Y92410 Unspecified street and highway as the place of occurrence of the external cause: Secondary | ICD-10-CM | POA: Insufficient documentation

## 2016-06-30 MED ORDER — TRAMADOL HCL 50 MG PO TABS: 50.0000 mg | ORAL_TABLET | Freq: Four times a day (QID) | ORAL | 0 refills | Status: DC | PRN
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
## 2016-07-19 ENCOUNTER — Emergency Department (HOSPITAL_COMMUNITY)
Admission: EM | Admit: 2016-07-19 | Discharge: 2016-07-19 | Disposition: A | Discharge status: Home / Self Care | Payer: Commercial Managed Care - HMO | Attending: Emergency Medicine | Admitting: Emergency Medicine

## 2016-07-19 ENCOUNTER — Emergency Department (HOSPITAL_COMMUNITY): Payer: Commercial Managed Care - HMO

## 2016-07-19 ENCOUNTER — Encounter (HOSPITAL_COMMUNITY): Payer: Self-pay | Admitting: Emergency Medicine

## 2016-07-19 DIAGNOSIS — M545 Low back pain: Secondary | ICD-10-CM | POA: Diagnosis present

## 2016-07-19 DIAGNOSIS — N183 Chronic kidney disease, stage 3 (moderate): Secondary | ICD-10-CM | POA: Insufficient documentation

## 2016-07-19 DIAGNOSIS — R52 Pain, unspecified: Secondary | ICD-10-CM

## 2016-07-19 DIAGNOSIS — M5442 Lumbago with sciatica, left side: Secondary | ICD-10-CM | POA: Insufficient documentation

## 2016-07-19 DIAGNOSIS — K8020 Calculus of gallbladder without cholecystitis without obstruction: Secondary | ICD-10-CM | POA: Diagnosis not present

## 2016-07-19 DIAGNOSIS — Y92410 Unspecified street and highway as the place of occurrence of the external cause: Secondary | ICD-10-CM | POA: Insufficient documentation

## 2016-07-19 DIAGNOSIS — I129 Hypertensive chronic kidney disease with stage 1 through stage 4 chronic kidney disease, or unspecified chronic kidney disease: Secondary | ICD-10-CM | POA: Insufficient documentation

## 2016-07-19 DIAGNOSIS — Y999 Unspecified external cause status: Secondary | ICD-10-CM | POA: Insufficient documentation

## 2016-07-19 DIAGNOSIS — Z79899 Other long term (current) drug therapy: Secondary | ICD-10-CM | POA: Insufficient documentation

## 2016-07-19 DIAGNOSIS — M5432 Sciatica, left side: Secondary | ICD-10-CM

## 2016-07-19 DIAGNOSIS — Y939 Activity, unspecified: Secondary | ICD-10-CM | POA: Insufficient documentation

## 2016-07-19 LAB — URINALYSIS, ROUTINE W REFLEX MICROSCOPIC
Bilirubin Urine: NEGATIVE
Glucose, UA: NEGATIVE mg/dL
KETONES UR: NEGATIVE mg/dL
LEUKOCYTES UA: NEGATIVE
NITRITE: NEGATIVE
Specific Gravity, Urine: 1.025 (ref 1.005–1.030)
pH: 6 (ref 5.0–8.0)

## 2016-07-19 LAB — URINE MICROSCOPIC-ADD ON: WBC UA: NONE SEEN WBC/hpf (ref 0–5)

## 2016-07-19 MED ORDER — HYDROCODONE-ACETAMINOPHEN 5-325 MG PO TABS: 1.0000 | ORAL_TABLET | Freq: Four times a day (QID) | ORAL | 0 refills | Status: DC | PRN

## 2016-07-19 MED ORDER — PREDNISONE 10 MG (21) PO TBPK: 10.0000 mg | ORAL_TABLET | Freq: Every day | ORAL | 0 refills | Status: DC

## 2016-07-19 MED ORDER — METHYLPREDNISOLONE SODIUM SUCC 125 MG IJ SOLR
125.0000 mg | Freq: Once | INTRAMUSCULAR | Status: AC
  Administered 2016-07-19: 125 mg via INTRAMUSCULAR
  Filled 2016-07-19: qty 2
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
## 2016-07-19 NOTE — ED Notes (Signed)
Pt taken to ct. Nad.  

## 2016-07-19 NOTE — ED Notes (Signed)
Pt updated and aware of ct scan
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
## 2016-07-19 NOTE — ED Notes (Signed)
Pt states he is going straight home to take bp meds.

## 2016-07-19 NOTE — Discharge Instructions (Signed)
Follow up with your md or chiropractor if needed

## 2016-10-10 ENCOUNTER — Encounter (HOSPITAL_COMMUNITY): Payer: Self-pay

## 2016-10-10 ENCOUNTER — Emergency Department (HOSPITAL_COMMUNITY)
Admission: EM | Admit: 2016-10-10 | Discharge: 2016-10-10 | Disposition: A | Discharge status: Home / Self Care | Payer: BLUE CROSS/BLUE SHIELD | Attending: Emergency Medicine | Admitting: Emergency Medicine

## 2016-10-10 DIAGNOSIS — N183 Chronic kidney disease, stage 3 (moderate): Secondary | ICD-10-CM | POA: Insufficient documentation

## 2016-10-10 DIAGNOSIS — Z79899 Other long term (current) drug therapy: Secondary | ICD-10-CM | POA: Insufficient documentation

## 2016-10-10 DIAGNOSIS — M10071 Idiopathic gout, right ankle and foot: Secondary | ICD-10-CM | POA: Insufficient documentation

## 2016-10-10 DIAGNOSIS — M109 Gout, unspecified: Secondary | ICD-10-CM

## 2016-10-10 DIAGNOSIS — I129 Hypertensive chronic kidney disease with stage 1 through stage 4 chronic kidney disease, or unspecified chronic kidney disease: Secondary | ICD-10-CM | POA: Insufficient documentation

## 2016-10-10 MED ORDER — DEXAMETHASONE SODIUM PHOSPHATE 4 MG/ML IJ SOLN
10.0000 mg | Freq: Once | INTRAMUSCULAR | Status: AC
  Administered 2016-10-10: 10 mg via INTRAMUSCULAR
  Filled 2016-10-10: qty 3

## 2016-10-10 MED ORDER — PREDNISONE 20 MG PO TABS: ORAL_TABLET | ORAL | 0 refills | Status: DC

## 2016-10-10 NOTE — ED Notes (Signed)
PA aware of bp.  Pt says he will address it with his pcp.  Pt says its usually only this high when he is in pain.

## 2016-10-10 NOTE — Discharge Instructions (Signed)
Take prednisone as prescribed until all gone. Please follow-up with your family doctor for recheck as needed.
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
## 2016-10-10 NOTE — ED Triage Notes (Signed)
Pt reports pain in r foot since yesterday.  Reports has history of gout and wants to have it treated before it gets bad.

## 2016-10-26 ENCOUNTER — Encounter (HOSPITAL_COMMUNITY): Payer: Self-pay | Admitting: Emergency Medicine

## 2016-10-26 ENCOUNTER — Emergency Department (HOSPITAL_COMMUNITY): Payer: Self-pay

## 2016-10-26 ENCOUNTER — Emergency Department (HOSPITAL_COMMUNITY)
Admission: EM | Admit: 2016-10-26 | Discharge: 2016-10-26 | Disposition: A | Discharge status: Home / Self Care | Payer: Self-pay | Attending: Emergency Medicine | Admitting: Emergency Medicine

## 2016-10-26 DIAGNOSIS — Z8673 Personal history of transient ischemic attack (TIA), and cerebral infarction without residual deficits: Secondary | ICD-10-CM | POA: Insufficient documentation

## 2016-10-26 DIAGNOSIS — J209 Acute bronchitis, unspecified: Secondary | ICD-10-CM | POA: Insufficient documentation

## 2016-10-26 DIAGNOSIS — R042 Hemoptysis: Secondary | ICD-10-CM | POA: Insufficient documentation

## 2016-10-26 DIAGNOSIS — N183 Chronic kidney disease, stage 3 (moderate): Secondary | ICD-10-CM | POA: Insufficient documentation

## 2016-10-26 DIAGNOSIS — Z79899 Other long term (current) drug therapy: Secondary | ICD-10-CM | POA: Insufficient documentation

## 2016-10-26 DIAGNOSIS — I129 Hypertensive chronic kidney disease with stage 1 through stage 4 chronic kidney disease, or unspecified chronic kidney disease: Secondary | ICD-10-CM | POA: Insufficient documentation

## 2016-10-26 LAB — BASIC METABOLIC PANEL
Anion gap: 8 (ref 5–15)
BUN: 27 mg/dL — ABNORMAL HIGH (ref 6–20)
CHLORIDE: 109 mmol/L (ref 101–111)
CO2: 22 mmol/L (ref 22–32)
CREATININE: 3.14 mg/dL — AB (ref 0.61–1.24)
Calcium: 9 mg/dL (ref 8.9–10.3)
GFR calc non Af Amer: 25 mL/min — ABNORMAL LOW (ref >60)
GFR, EST AFRICAN AMERICAN: 29 mL/min — AB (ref >60)
Glucose, Bld: 78 mg/dL (ref 65–99)
POTASSIUM: 4.3 mmol/L (ref 3.5–5.1)
SODIUM: 139 mmol/L (ref 135–145)

## 2016-10-26 LAB — CBC WITH DIFFERENTIAL/PLATELET
BASOS PCT: 0 %
Basophils Absolute: 0 10*3/uL (ref 0.0–0.1)
EOS ABS: 0.1 10*3/uL (ref 0.0–0.7)
Eosinophils Relative: 1 %
HCT: 49.4 % (ref 39.0–52.0)
HEMOGLOBIN: 16.7 g/dL (ref 13.0–17.0)
Lymphocytes Relative: 25 %
Lymphs Abs: 2.8 10*3/uL (ref 0.7–4.0)
MCH: 29.9 pg (ref 26.0–34.0)
MCHC: 33.8 g/dL (ref 30.0–36.0)
MCV: 88.5 fL (ref 78.0–100.0)
Monocytes Absolute: 0.7 10*3/uL (ref 0.1–1.0)
Monocytes Relative: 6 %
NEUTROS PCT: 68 %
Neutro Abs: 7.5 10*3/uL (ref 1.7–7.7)
Platelets: 247 10*3/uL (ref 150–400)
RBC: 5.58 MIL/uL (ref 4.22–5.81)
RDW: 14.5 % (ref 11.5–15.5)
WBC: 11.2 10*3/uL — AB (ref 4.0–10.5)

## 2016-10-26 LAB — D-DIMER, QUANTITATIVE (NOT AT ARMC): D DIMER QUANT: 1.46 ug{FEU}/mL — AB (ref 0.00–0.50)

## 2016-10-26 MED ORDER — SODIUM CHLORIDE 0.9 % IV BOLUS (SEPSIS)
1000.0000 mL | Freq: Once | INTRAVENOUS | Status: AC
  Administered 2016-10-26: 1000 mL via INTRAVENOUS

## 2016-10-26 MED ORDER — BENZONATATE 100 MG PO CAPS: 100.0000 mg | ORAL_CAPSULE | Freq: Three times a day (TID) | ORAL | 0 refills | Status: DC
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
## 2016-10-26 NOTE — ED Notes (Signed)
Pt returned from lung scan.

## 2016-10-26 NOTE — ED Notes (Signed)
EDP at bedside  
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
## 2016-10-26 NOTE — ED Notes (Signed)
Pt transported to nuclear medicine for lung scan.

## 2016-11-20 DIAGNOSIS — Z888 Allergy status to other drugs, medicaments and biological substances status: Secondary | ICD-10-CM | POA: Diagnosis not present

## 2016-11-20 DIAGNOSIS — Z79899 Other long term (current) drug therapy: Secondary | ICD-10-CM | POA: Diagnosis not present

## 2016-11-20 DIAGNOSIS — N189 Chronic kidney disease, unspecified: Secondary | ICD-10-CM | POA: Diagnosis not present

## 2016-11-20 DIAGNOSIS — M10372 Gout due to renal impairment, left ankle and foot: Secondary | ICD-10-CM | POA: Diagnosis not present

## 2016-11-20 DIAGNOSIS — M10471 Other secondary gout, right ankle and foot: Secondary | ICD-10-CM | POA: Diagnosis not present

## 2016-11-20 DIAGNOSIS — Z8673 Personal history of transient ischemic attack (TIA), and cerebral infarction without residual deficits: Secondary | ICD-10-CM | POA: Diagnosis not present

## 2016-11-20 DIAGNOSIS — M79672 Pain in left foot: Secondary | ICD-10-CM | POA: Diagnosis not present

## 2016-11-20 DIAGNOSIS — I129 Hypertensive chronic kidney disease with stage 1 through stage 4 chronic kidney disease, or unspecified chronic kidney disease: Secondary | ICD-10-CM | POA: Diagnosis not present

## 2016-12-10 DIAGNOSIS — M109 Gout, unspecified: Secondary | ICD-10-CM | POA: Diagnosis not present

## 2016-12-10 DIAGNOSIS — I10 Essential (primary) hypertension: Secondary | ICD-10-CM | POA: Diagnosis not present

## 2016-12-14 DIAGNOSIS — M7989 Other specified soft tissue disorders: Secondary | ICD-10-CM | POA: Diagnosis not present

## 2016-12-14 DIAGNOSIS — I10 Essential (primary) hypertension: Secondary | ICD-10-CM | POA: Diagnosis not present

## 2016-12-14 DIAGNOSIS — Z79899 Other long term (current) drug therapy: Secondary | ICD-10-CM | POA: Diagnosis not present

## 2016-12-14 DIAGNOSIS — Z888 Allergy status to other drugs, medicaments and biological substances status: Secondary | ICD-10-CM | POA: Diagnosis not present

## 2016-12-14 DIAGNOSIS — M19072 Primary osteoarthritis, left ankle and foot: Secondary | ICD-10-CM | POA: Diagnosis not present

## 2016-12-14 DIAGNOSIS — M25572 Pain in left ankle and joints of left foot: Secondary | ICD-10-CM | POA: Diagnosis not present

## 2016-12-14 DIAGNOSIS — Z8673 Personal history of transient ischemic attack (TIA), and cerebral infarction without residual deficits: Secondary | ICD-10-CM | POA: Diagnosis not present

## 2016-12-14 DIAGNOSIS — M10072 Idiopathic gout, left ankle and foot: Secondary | ICD-10-CM | POA: Diagnosis not present

## 2016-12-14 DIAGNOSIS — Z7982 Long term (current) use of aspirin: Secondary | ICD-10-CM | POA: Diagnosis not present

## 2016-12-14 DIAGNOSIS — N289 Disorder of kidney and ureter, unspecified: Secondary | ICD-10-CM | POA: Diagnosis not present

## 2016-12-20 DIAGNOSIS — E782 Mixed hyperlipidemia: Secondary | ICD-10-CM | POA: Diagnosis not present

## 2016-12-20 DIAGNOSIS — Z6841 Body Mass Index (BMI) 40.0 and over, adult: Secondary | ICD-10-CM | POA: Diagnosis not present

## 2016-12-20 DIAGNOSIS — I10 Essential (primary) hypertension: Secondary | ICD-10-CM | POA: Diagnosis not present

## 2016-12-20 DIAGNOSIS — M109 Gout, unspecified: Secondary | ICD-10-CM | POA: Diagnosis not present

## 2016-12-20 DIAGNOSIS — Z1389 Encounter for screening for other disorder: Secondary | ICD-10-CM | POA: Diagnosis not present

## 2017-01-27 DIAGNOSIS — M109 Gout, unspecified: Secondary | ICD-10-CM | POA: Diagnosis not present

## 2017-01-27 DIAGNOSIS — M25571 Pain in right ankle and joints of right foot: Secondary | ICD-10-CM | POA: Diagnosis not present

## 2017-01-27 DIAGNOSIS — I10 Essential (primary) hypertension: Secondary | ICD-10-CM | POA: Diagnosis not present

## 2017-01-27 DIAGNOSIS — Z6841 Body Mass Index (BMI) 40.0 and over, adult: Secondary | ICD-10-CM | POA: Diagnosis not present

## 2017-01-27 DIAGNOSIS — M25562 Pain in left knee: Secondary | ICD-10-CM | POA: Diagnosis not present

## 2017-01-27 DIAGNOSIS — Z1389 Encounter for screening for other disorder: Secondary | ICD-10-CM | POA: Diagnosis not present

## 2017-01-27 DIAGNOSIS — E782 Mixed hyperlipidemia: Secondary | ICD-10-CM | POA: Diagnosis not present

## 2017-01-27 DIAGNOSIS — M1991 Primary osteoarthritis, unspecified site: Secondary | ICD-10-CM | POA: Diagnosis not present

## 2017-03-09 ENCOUNTER — Emergency Department
Admission: EM | Admit: 2017-03-09 | Discharge: 2017-03-09 | Disposition: A | Discharge status: Home / Self Care | Payer: BLUE CROSS/BLUE SHIELD | Source: Home / Self Care | Attending: Family Medicine | Admitting: Family Medicine

## 2017-03-09 DIAGNOSIS — M10021 Idiopathic gout, right elbow: Secondary | ICD-10-CM | POA: Diagnosis not present

## 2017-03-09 DIAGNOSIS — M10071 Idiopathic gout, right ankle and foot: Secondary | ICD-10-CM

## 2017-03-09 MED ORDER — DEXAMETHASONE SODIUM PHOSPHATE 10 MG/ML IJ SOLN
10.0000 mg | Freq: Once | INTRAMUSCULAR | Status: AC
  Administered 2017-03-09: 10 mg via INTRAMUSCULAR

## 2017-03-09 MED ORDER — PREDNISONE 20 MG PO TABS: ORAL_TABLET | ORAL | 0 refills | Status: DC
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
## 2017-03-09 NOTE — ED Triage Notes (Signed)
Pt stated that the started last night with pain in the right elbow and right ankle.  Has a hx of gout.

## 2017-03-28 ENCOUNTER — Ambulatory Visit (INDEPENDENT_AMBULATORY_CARE_PROVIDER_SITE_OTHER): Payer: BLUE CROSS/BLUE SHIELD | Admitting: Physician Assistant

## 2017-03-28 ENCOUNTER — Encounter: Payer: Self-pay | Admitting: Physician Assistant

## 2017-03-28 VITALS — BP 132/83 | HR 79 | Ht 73.0 in | Wt 320.0 lb

## 2017-03-28 DIAGNOSIS — M10371 Gout due to renal impairment, right ankle and foot: Secondary | ICD-10-CM

## 2017-03-28 DIAGNOSIS — I129 Hypertensive chronic kidney disease with stage 1 through stage 4 chronic kidney disease, or unspecified chronic kidney disease: Secondary | ICD-10-CM

## 2017-03-28 DIAGNOSIS — I428 Other cardiomyopathies: Secondary | ICD-10-CM | POA: Diagnosis not present

## 2017-03-28 DIAGNOSIS — E785 Hyperlipidemia, unspecified: Secondary | ICD-10-CM

## 2017-03-28 DIAGNOSIS — N184 Chronic kidney disease, stage 4 (severe): Secondary | ICD-10-CM | POA: Insufficient documentation

## 2017-03-28 DIAGNOSIS — Z131 Encounter for screening for diabetes mellitus: Secondary | ICD-10-CM

## 2017-03-28 DIAGNOSIS — Z6841 Body Mass Index (BMI) 40.0 and over, adult: Secondary | ICD-10-CM

## 2017-03-28 DIAGNOSIS — Z13220 Encounter for screening for lipoid disorders: Secondary | ICD-10-CM

## 2017-03-28 DIAGNOSIS — K219 Gastro-esophageal reflux disease without esophagitis: Secondary | ICD-10-CM | POA: Diagnosis not present

## 2017-03-28 DIAGNOSIS — M109 Gout, unspecified: Secondary | ICD-10-CM | POA: Insufficient documentation

## 2017-03-28 DIAGNOSIS — N183 Chronic kidney disease, stage 3 unspecified: Secondary | ICD-10-CM

## 2017-03-28 DIAGNOSIS — Z8673 Personal history of transient ischemic attack (TIA), and cerebral infarction without residual deficits: Secondary | ICD-10-CM | POA: Diagnosis not present

## 2017-03-28 DIAGNOSIS — E66813 Obesity, class 3: Secondary | ICD-10-CM | POA: Insufficient documentation

## 2017-03-28 DIAGNOSIS — Z7689 Persons encountering health services in other specified circumstances: Secondary | ICD-10-CM

## 2017-03-28 HISTORY — DX: Other cardiomyopathies: I42.8

## 2017-03-28 HISTORY — DX: Hypertensive chronic kidney disease with stage 1 through stage 4 chronic kidney disease, or unspecified chronic kidney disease: N18.4

## 2017-03-28 HISTORY — DX: Hypertensive chronic kidney disease with stage 1 through stage 4 chronic kidney disease, or unspecified chronic kidney disease: I12.9

## 2017-03-28 MED ORDER — PREDNISONE 50 MG PO TABS: ORAL_TABLET | ORAL | 0 refills | Status: DC
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
## 2017-03-28 NOTE — Patient Instructions (Addendum)
- Make an appointment with Sports Medicine today - Prednisone 50mg  daily for 5 days - I have also ordered fasting labs. The lab is a walk-in open M-F 7:30a-4:30p (closed 12:30-1:30p). Nothing to eat or drink after midnight or at least 8 hours before your blood draw. You can have water and your medications.  - We will wait for your labs to see your kidney function before we start any medication. Okay to do Ranitidine 150mg  as needed. Lifestyle measures (below)   Food Choices for Gastroesophageal Reflux Disease, Adult When you have gastroesophageal reflux disease (GERD), the foods you eat and your eating habits are very important. Choosing the right foods can help ease the discomfort of GERD. Consider working with a diet and nutrition specialist (dietitian) to help you make healthy food choices. What general guidelines should I follow? Eating plan  Choose healthy foods low in fat, such as fruits, vegetables, whole grains, low-fat dairy products, and lean meat, fish, and poultry.  Eat frequent, small meals instead of three large meals each day. Eat your meals slowly, in a relaxed setting. Avoid bending over or lying down until 2-3 hours after eating.  Limit high-fat foods such as fatty meats or fried foods.  Limit your intake of oils, butter, and shortening to less than 8 teaspoons each day.  Avoid the following: ? Foods that cause symptoms. These may be different for different people. Keep a food diary to keep track of foods that cause symptoms. ? Alcohol. ? Drinking large amounts of liquid with meals. ? Eating meals during the 2-3 hours before bed.  Cook foods using methods other than frying. This may include baking, grilling, or broiling. Lifestyle   Maintain a healthy weight. Ask your health care provider what weight is healthy for you. If you need to lose weight, work with your health care provider to do so safely.  Exercise for at least 30 minutes on 5 or more days each week, or as  told by your health care provider.  Avoid wearing clothes that fit tightly around your waist and chest.  Do not use any products that contain nicotine or tobacco, such as cigarettes and e-cigarettes. If you need help quitting, ask your health care provider.  Sleep with the head of your bed raised. Use a wedge under the mattress or blocks under the bed frame to raise the head of the bed. What foods are not recommended? The items listed may not be a complete list. Talk with your dietitian about what dietary choices are best for you. Grains Pastries or quick breads with added fat. [place] toast. Vegetables Deep fried vegetables. [place] fries. Any vegetables prepared with added fat. Any vegetables that cause symptoms. For some people this may include tomatoes and tomato products, chili peppers, onions and garlic, and horseradish. Fruits Any fruits prepared with added fat. Any fruits that cause symptoms. For some people this may include citrus fruits, such as oranges, grapefruit, pineapple, and lemons. Meats and other protein foods High-fat meats, such as fatty beef or pork, hot dogs, ribs, ham, sausage, salami and bacon. Fried meat or protein, including fried fish and fried chicken. Nuts and nut butters. Dairy Whole milk and chocolate milk. Sour cream. Cream. Ice cream. Cream cheese. Milk shakes. Beverages Coffee and tea, with or without caffeine. Carbonated beverages. Sodas. Energy drinks. Fruit juice made with acidic fruits (such as orange or grapefruit). Tomato juice. Alcoholic drinks. Fats and oils Butter. Margarine. Shortening. Ghee. Sweets and desserts Chocolate and cocoa. Donuts. Seasoning  and other foods Pepper. Peppermint and spearmint. Any condiments, herbs, or seasonings that cause symptoms. For some people, this may include curry, hot sauce, or vinegar-based salad dressings. Summary  When you have gastroesophageal reflux disease (GERD), food and lifestyle choices are very  important to help ease the discomfort of GERD.  Eat frequent, small meals instead of three large meals each day. Eat your meals slowly, in a relaxed setting. Avoid bending over or lying down until 2-3 hours after eating.  Limit high-fat foods such as fatty meat or fried foods. This information is not intended to replace advice given to you by your health care provider. Make sure you discuss any questions you have with your health care provider. Document Released: 09/06/2005 Document Revised: 09/07/2016 Document Reviewed: 09/07/2016 Elsevier Interactive Patient Education  2017 [employer].

## 2017-03-30 ENCOUNTER — Ambulatory Visit (INDEPENDENT_AMBULATORY_CARE_PROVIDER_SITE_OTHER): Payer: BLUE CROSS/BLUE SHIELD | Admitting: Family Medicine

## 2017-03-30 ENCOUNTER — Encounter: Payer: Self-pay | Admitting: Family Medicine

## 2017-03-30 VITALS — BP 147/97 | HR 85 | Wt 326.0 lb

## 2017-03-30 DIAGNOSIS — N183 Chronic kidney disease, stage 3 unspecified: Secondary | ICD-10-CM

## 2017-03-30 DIAGNOSIS — M10371 Gout due to renal impairment, right ankle and foot: Secondary | ICD-10-CM | POA: Diagnosis not present

## 2017-03-30 MED ORDER — COLCHICINE 0.6 MG PO TABS
0.3000 mg | ORAL_TABLET | Freq: Every day | ORAL | 2 refills | Status: DC
Start: 2017-03-30 — End: 2017-09-25

## 2017-03-30 NOTE — Patient Instructions (Signed)
Thank you for coming in today. Get labs soon.  Establish care with Dr Sheppard Coil soon.  Follow up with me as needed for gout flairs or other sports issues.  Take colchicine 1/2 pill (0.3mg  daily) for flairs as needed.

## 2017-03-30 NOTE — Progress Notes (Signed)
Subjective:    I'm seeing this patient as a consultation for:  [name], PA-C  Also CC: Dr Elmarie Shiley   CC: Gout  HPI: Zachary Lynch has a history of chronic gout complicated by chronic kidney disease hypertension and cardiomyopathy. He previously was pretty well managed with colchicine. At some point he also was started on allopurinol. He notes on allopurinol he is having fewer gout attacks. He was seen recently for a gout attack of the right ankle and was started on a prednisone course. He notes this works quite well. Overall after few days of prednisone is feeling much better. He denies any radiating pain weakness or numbness. He recently established care with my colleague Nelson Chimes PA-C who ordered labs which have not yet been done. His kidney disease is managed by Dr. Posey Pronto at [facility].  Past medical history, Surgical history, Family history not pertinant except as noted below, Social history, Allergies, and medications have been entered into the medical record, reviewed, and no changes needed.   Review of Systems: No headache, visual changes, nausea, vomiting, diarrhea, constipation, dizziness, abdominal pain, skin rash, fevers, chills, night sweats, weight loss, swollen lymph nodes, body aches, joint swelling, muscle aches, chest pain, shortness of breath, mood changes, visual or auditory hallucinations.   Objective:    Vitals:   03/30/17 1623  BP: (!) 147/97  Pulse: 85   General: Well Developed, well nourished, and in no acute distress.  Neuro/Psych: Alert and oriented x3, extra-ocular muscles intact, able to move all 4 extremities, sensation grossly intact. Skin: Warm and dry, no rashes noted.  Respiratory: Not using accessory muscles, speaking in full sentences, trachea midline.  Cardiovascular: Pulses palpable, no extremity edema. Abdomen: Does not appear distended. MSK: Right foot and ankle mild effusion at the ankle joint. However it is  nontender with normal motion. Pulses capillary refill and sensation are intact.    Chemistry      Component Value Date/Time   NA 139 10/26/2016 1352   K 4.3 10/26/2016 1352   CL 109 10/26/2016 1352   CO2 22 10/26/2016 1352   BUN 27 (H) 10/26/2016 1352   CREATININE 3.14 (H) 10/26/2016 1352      Component Value Date/Time   CALCIUM 9.0 10/26/2016 1352   ALKPHOS 61 09/29/2008 0600   AST 21 09/29/2008 0600   ALT 31 09/29/2008 0600   BILITOT 2.2 (H) 09/29/2008 0600        No results found for this or any previous visit (from the past 24 hour(s)). No results found.  Impression and Recommendations:    Assessment and Plan: 31 y.o. male with Frequent gout episodes. Patient has chronic gout that is complicated by chronic kidney disease.  I think is reasonable to check on the labs that were recently ordered. We'll be able to reassess kidney function as well as uric acid levels which are in a be important for managing gout further. The meantime based on the most recent creatinine and a GFR around 30 think it's reasonable to use 0.3 mg of colchicine daily for gout episodes. I would like to avoid steroids if possible if he can tolerate and afford colchicine.  Additionally I think we can also manage future gout attacks with interarticular injections if his symptoms are not easily controllable with oral medications. Fortunately he is doing quite well today and I think we can avoid further steroids if possible.  I will send a copy of this note in the labs when they  come back to his nephrologist Dr. Posey Pronto   No orders of the defined types were placed in this encounter.  Meds ordered this encounter  Medications  . colchicine 0.6 MG tablet    Sig: Take 0.5 tablets (0.3 mg total) by mouth daily.    Dispense:  30 tablet    Refill:  2    Discussed warning signs or symptoms. Please see discharge instructions. Patient expresses understanding.

## 2017-04-08 ENCOUNTER — Telehealth: Payer: Self-pay

## 2017-04-08 NOTE — Telephone Encounter (Signed)
Pt called stating that during his visit on the 11th you told him you would give him another prescription for prednisone. Read pt's chart and this was not stated. Please advise.

## 2017-04-09 ENCOUNTER — Encounter: Payer: Self-pay | Admitting: Emergency Medicine

## 2017-04-09 ENCOUNTER — Emergency Department
Admission: EM | Admit: 2017-04-09 | Discharge: 2017-04-09 | Disposition: A | Discharge status: Home / Self Care | Payer: BLUE CROSS/BLUE SHIELD | Source: Home / Self Care | Attending: Family Medicine | Admitting: Family Medicine

## 2017-04-09 ENCOUNTER — Telehealth: Payer: Self-pay | Admitting: Family Medicine

## 2017-04-09 DIAGNOSIS — R030 Elevated blood-pressure reading, without diagnosis of hypertension: Secondary | ICD-10-CM

## 2017-04-09 DIAGNOSIS — M10072 Idiopathic gout, left ankle and foot: Secondary | ICD-10-CM

## 2017-04-09 MED ORDER — PREDNISONE 20 MG PO TABS: ORAL_TABLET | ORAL | 0 refills | Status: DC
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
## 2017-04-09 NOTE — Telephone Encounter (Signed)
PT in urgent care with gout flair.  Plan to use prednisone and get fasting labs that were ordered on 7/9.  Follow up with me or [name]

## 2017-04-09 NOTE — ED Triage Notes (Signed)
Patient is c/o left big toe with gout pain x 1 day, started on Colchine, doesn't know if it made it worse, was out of meds x 1 week before starting the Colchine.  When pt noticed the pain, he stopped the Colchine yesterday.

## 2017-04-29 DIAGNOSIS — M10371 Gout due to renal impairment, right ankle and foot: Secondary | ICD-10-CM | POA: Diagnosis not present

## 2017-04-29 DIAGNOSIS — Z131 Encounter for screening for diabetes mellitus: Secondary | ICD-10-CM | POA: Diagnosis not present

## 2017-04-29 DIAGNOSIS — Z13220 Encounter for screening for lipoid disorders: Secondary | ICD-10-CM | POA: Diagnosis not present

## 2017-04-29 DIAGNOSIS — N183 Chronic kidney disease, stage 3 (moderate): Secondary | ICD-10-CM | POA: Diagnosis not present

## 2017-04-29 LAB — CBC
HCT: 45.9 % (ref 38.5–50.0)
HEMOGLOBIN: 15.7 g/dL (ref 13.2–17.1)
MCH: 31 pg (ref 27.0–33.0)
MCHC: 34.2 g/dL (ref 32.0–36.0)
MCV: 90.5 fL (ref 80.0–100.0)
MPV: 9.6 fL (ref 7.5–12.5)
Platelets: 271 10*3/uL (ref 140–400)
RBC: 5.07 MIL/uL (ref 4.20–5.80)
RDW: 14.3 % (ref 11.0–15.0)
WBC: 10.8 10*3/uL (ref 3.8–10.8)

## 2017-04-30 LAB — LIPID PANEL W/REFLEX DIRECT LDL
Cholesterol: 186 mg/dL (ref <200)
HDL: 29 mg/dL — ABNORMAL LOW (ref >40)
LDL-CHOLESTEROL: 136 mg/dL — AB
NON-HDL CHOLESTEROL (CALC): 157 mg/dL — AB (ref <130)
Total CHOL/HDL Ratio: 6.4 Ratio — ABNORMAL HIGH (ref <5.0)
Triglycerides: 103 mg/dL (ref <150)

## 2017-04-30 LAB — COMPLETE METABOLIC PANEL WITH GFR
ALK PHOS: 64 U/L (ref 40–115)
ALT: 12 U/L (ref 9–46)
AST: 12 U/L (ref 10–40)
Albumin: 3.5 g/dL — ABNORMAL LOW (ref 3.6–5.1)
BILIRUBIN TOTAL: 0.8 mg/dL (ref 0.2–1.2)
BUN: 27 mg/dL — ABNORMAL HIGH (ref 7–25)
CO2: 23 mmol/L (ref 20–32)
CREATININE: 3.32 mg/dL — AB (ref 0.60–1.35)
Calcium: 9.2 mg/dL (ref 8.6–10.3)
Chloride: 108 mmol/L (ref 98–110)
GFR, Est African American: 27 mL/min — ABNORMAL LOW (ref >=60)
GFR, Est Non African American: 23 mL/min — ABNORMAL LOW (ref >=60)
GLUCOSE: 89 mg/dL (ref 65–99)
Potassium: 3.9 mmol/L (ref 3.5–5.3)
SODIUM: 141 mmol/L (ref 135–146)
Total Protein: 6.4 g/dL (ref 6.1–8.1)

## 2017-04-30 LAB — HEMOGLOBIN A1C
HEMOGLOBIN A1C: 5.7 % — AB (ref <5.7)
Mean Plasma Glucose: 117 mg/dL

## 2017-04-30 LAB — URIC ACID: URIC ACID, SERUM: 10.8 mg/dL — AB (ref 4.0–8.0)

## 2017-05-01 ENCOUNTER — Emergency Department
Admission: EM | Admit: 2017-05-01 | Discharge: 2017-05-01 | Disposition: A | Discharge status: Home / Self Care | Payer: BLUE CROSS/BLUE SHIELD | Source: Home / Self Care | Attending: Family Medicine | Admitting: Family Medicine

## 2017-05-01 DIAGNOSIS — M10071 Idiopathic gout, right ankle and foot: Secondary | ICD-10-CM

## 2017-05-01 MED ORDER — TRAMADOL HCL 50 MG PO TABS: 50.0000 mg | ORAL_TABLET | Freq: Four times a day (QID) | ORAL | 0 refills | Status: DC | PRN

## 2017-05-01 NOTE — Discharge Instructions (Signed)
°  Tramadol is strong pain medication. While taking, do not drink alcohol, drive, or perform any other activities that requires focus while taking these medications.  ° °
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
## 2017-05-01 NOTE — ED Triage Notes (Signed)
Gout, Right ankle and toe pain started last night, chronic

## 2017-05-03 ENCOUNTER — Encounter: Payer: Self-pay | Admitting: Physician Assistant

## 2017-05-03 DIAGNOSIS — R944 Abnormal results of kidney function studies: Secondary | ICD-10-CM | POA: Insufficient documentation

## 2017-05-03 DIAGNOSIS — E790 Hyperuricemia without signs of inflammatory arthritis and tophaceous disease: Secondary | ICD-10-CM | POA: Insufficient documentation

## 2017-05-03 DIAGNOSIS — E785 Hyperlipidemia, unspecified: Secondary | ICD-10-CM | POA: Insufficient documentation

## 2017-05-03 DIAGNOSIS — R7303 Prediabetes: Secondary | ICD-10-CM | POA: Insufficient documentation

## 2017-05-03 HISTORY — DX: Hyperuricemia without signs of inflammatory arthritis and tophaceous disease: E79.0

## 2017-05-03 HISTORY — DX: Prediabetes: R73.03

## 2017-05-03 MED ORDER — ATORVASTATIN CALCIUM 20 MG PO TABS: 20.0000 mg | ORAL_TABLET | Freq: Every day | ORAL | 3 refills | Status: DC

## 2017-05-03 NOTE — Progress Notes (Signed)
Hi Wenceslaus,  Your labs show some concerning findings:  Your chronic kidney disease has worsened and your GFR is showing stage 4 CKD. I recommend close follow-up with your nephrologist. I have placed a referral to help get you in with Dr. Posey Pronto sooner.  Your uric acid is elevated, but we cannot increase your Allopurinol above 200mg  with your current kidney function. I would defer to your kidney doctor on whether a higher dose is okay.  Your A1C is in the prediabetic range. Limit carbohydrates and simple sugars. I recommend following up with a nutritionist or the weight loss clinic, if you have a preference.  Given your history of heart disease with very low ejection fraction, you will need to follow-up with your cardiologist for your medication refills of carvidilol and losartan. Are you still seeing Dr. Domenic Polite at [facility]? Let me know if you need a new referral.  Lastly, your LDL cholesterol is borderline high. Given your other medical problems, this increases your risk of heart attack/stroke. I recommend starting Atorvastatin nightly to reduce this risk. I have sent a prescription to your pharmacy. Recheck a fasting lipid panel in 3 months.  Please let me know if you have any questions or concerns.  Best, [name]

## 2017-05-03 NOTE — Addendum Note (Signed)
Addended by: Nelson Chimes E on: 05/03/2017 05:42 PM   Modules accepted: Orders

## 2017-05-04 ENCOUNTER — Encounter: Payer: Self-pay | Admitting: Physician Assistant

## 2017-05-04 DIAGNOSIS — M10071 Idiopathic gout, right ankle and foot: Secondary | ICD-10-CM | POA: Diagnosis not present

## 2017-05-04 DIAGNOSIS — I129 Hypertensive chronic kidney disease with stage 1 through stage 4 chronic kidney disease, or unspecified chronic kidney disease: Secondary | ICD-10-CM | POA: Diagnosis not present

## 2017-05-04 DIAGNOSIS — M19071 Primary osteoarthritis, right ankle and foot: Secondary | ICD-10-CM | POA: Diagnosis not present

## 2017-05-04 DIAGNOSIS — M25571 Pain in right ankle and joints of right foot: Secondary | ICD-10-CM | POA: Diagnosis not present

## 2017-05-04 DIAGNOSIS — M7989 Other specified soft tissue disorders: Secondary | ICD-10-CM | POA: Diagnosis not present

## 2017-05-04 DIAGNOSIS — M109 Gout, unspecified: Secondary | ICD-10-CM | POA: Diagnosis not present

## 2017-05-04 DIAGNOSIS — Z79899 Other long term (current) drug therapy: Secondary | ICD-10-CM | POA: Diagnosis not present

## 2017-05-04 DIAGNOSIS — N179 Acute kidney failure, unspecified: Secondary | ICD-10-CM | POA: Insufficient documentation

## 2017-05-04 DIAGNOSIS — R509 Fever, unspecified: Secondary | ICD-10-CM | POA: Diagnosis not present

## 2017-05-04 DIAGNOSIS — N184 Chronic kidney disease, stage 4 (severe): Secondary | ICD-10-CM | POA: Insufficient documentation

## 2017-05-04 DIAGNOSIS — I10 Essential (primary) hypertension: Secondary | ICD-10-CM | POA: Diagnosis not present

## 2017-05-04 DIAGNOSIS — N19 Unspecified kidney failure: Secondary | ICD-10-CM | POA: Diagnosis not present

## 2017-05-04 DIAGNOSIS — Z8673 Personal history of transient ischemic attack (TIA), and cerebral infarction without residual deficits: Secondary | ICD-10-CM | POA: Diagnosis not present

## 2017-05-04 DIAGNOSIS — M129 Arthropathy, unspecified: Secondary | ICD-10-CM | POA: Diagnosis not present

## 2017-05-04 DIAGNOSIS — R6510 Systemic inflammatory response syndrome (SIRS) of non-infectious origin without acute organ dysfunction: Secondary | ICD-10-CM | POA: Diagnosis not present

## 2017-05-05 ENCOUNTER — Other Ambulatory Visit: Payer: Self-pay | Admitting: Physician Assistant

## 2017-05-05 ENCOUNTER — Telehealth: Payer: Self-pay | Admitting: Cardiology

## 2017-05-05 DIAGNOSIS — I10 Essential (primary) hypertension: Secondary | ICD-10-CM | POA: Diagnosis not present

## 2017-05-05 DIAGNOSIS — M25571 Pain in right ankle and joints of right foot: Secondary | ICD-10-CM | POA: Diagnosis not present

## 2017-05-05 DIAGNOSIS — M10371 Gout due to renal impairment, right ankle and foot: Secondary | ICD-10-CM

## 2017-05-05 DIAGNOSIS — I428 Other cardiomyopathies: Secondary | ICD-10-CM

## 2017-05-05 DIAGNOSIS — M129 Arthropathy, unspecified: Secondary | ICD-10-CM | POA: Diagnosis not present

## 2017-05-05 DIAGNOSIS — M109 Gout, unspecified: Secondary | ICD-10-CM | POA: Diagnosis not present

## 2017-05-05 DIAGNOSIS — N179 Acute kidney failure, unspecified: Secondary | ICD-10-CM | POA: Diagnosis not present

## 2017-05-05 MED ORDER — ALLOPURINOL 300 MG PO TABS: 300.0000 mg | ORAL_TABLET | Freq: Every day | ORAL | 3 refills | Status: DC

## 2017-05-05 NOTE — Telephone Encounter (Signed)
That is okay with me if Dr. Stanford Breed agrees. I have not actually seen him since 2016. Seems like it would be more convenient for the patient.

## 2017-05-05 NOTE — Progress Notes (Signed)
Lab Results  Component Value Date   LABURIC 10.8 (H) 04/29/2017   Uric acid 10.8 on Allopurinol 200mg . Quesiton compliance. Increasing to 300mg  daily. Will monitor kidney function closely.  Patient also instructed to see Sports Medicine for a joint injection.

## 2017-05-05 NOTE — Telephone Encounter (Signed)
New Message     Pt has moved to [facility] he would like to switch from Dr Domenic Polite in [place] to Dr Stanford Breed in [place]

## 2017-05-05 NOTE — Telephone Encounter (Signed)
Ok with me Zachary Lynch  

## 2017-05-06 ENCOUNTER — Other Ambulatory Visit: Payer: Self-pay | Admitting: Physician Assistant

## 2017-05-06 DIAGNOSIS — K219 Gastro-esophageal reflux disease without esophagitis: Secondary | ICD-10-CM

## 2017-05-06 MED ORDER — SUCRALFATE 1 G PO TABS: 1.0000 g | ORAL_TABLET | Freq: Two times a day (BID) | ORAL | 0 refills | Status: DC
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
## 2017-06-22 ENCOUNTER — Ambulatory Visit: Payer: BLUE CROSS/BLUE SHIELD | Admitting: Cardiology

## 2017-07-03 ENCOUNTER — Other Ambulatory Visit: Payer: Self-pay | Admitting: Physician Assistant

## 2017-07-03 DIAGNOSIS — K219 Gastro-esophageal reflux disease without esophagitis: Secondary | ICD-10-CM

## 2017-09-25 ENCOUNTER — Other Ambulatory Visit: Payer: Self-pay | Admitting: Family Medicine

## 2017-09-29 ENCOUNTER — Emergency Department (HOSPITAL_COMMUNITY)
Admission: EM | Admit: 2017-09-29 | Discharge: 2017-09-29 | Disposition: A | Discharge status: Home / Self Care | Payer: BLUE CROSS/BLUE SHIELD | Attending: Emergency Medicine | Admitting: Emergency Medicine

## 2017-09-29 ENCOUNTER — Encounter (HOSPITAL_COMMUNITY): Payer: Self-pay

## 2017-09-29 DIAGNOSIS — N184 Chronic kidney disease, stage 4 (severe): Secondary | ICD-10-CM | POA: Diagnosis not present

## 2017-09-29 DIAGNOSIS — M109 Gout, unspecified: Secondary | ICD-10-CM | POA: Insufficient documentation

## 2017-09-29 DIAGNOSIS — Z8673 Personal history of transient ischemic attack (TIA), and cerebral infarction without residual deficits: Secondary | ICD-10-CM | POA: Insufficient documentation

## 2017-09-29 DIAGNOSIS — I129 Hypertensive chronic kidney disease with stage 1 through stage 4 chronic kidney disease, or unspecified chronic kidney disease: Secondary | ICD-10-CM | POA: Insufficient documentation

## 2017-09-29 DIAGNOSIS — Z79899 Other long term (current) drug therapy: Secondary | ICD-10-CM | POA: Insufficient documentation

## 2017-09-29 DIAGNOSIS — M79642 Pain in left hand: Secondary | ICD-10-CM | POA: Diagnosis not present

## 2017-09-29 MED ORDER — PREDNISONE 10 MG PO TABS: ORAL_TABLET | ORAL | 0 refills | Status: DC

## 2017-09-29 MED ORDER — DEXAMETHASONE SODIUM PHOSPHATE 4 MG/ML IJ SOLN
10.0000 mg | Freq: Once | INTRAMUSCULAR | Status: AC
  Administered 2017-09-29: 10 mg via INTRAMUSCULAR
  Filled 2017-09-29: qty 3

## 2017-09-29 NOTE — ED Triage Notes (Signed)
Pt c/o pain and swelling to left hand.  Denies injury.  Reports history of gout.

## 2017-09-29 NOTE — Discharge Instructions (Signed)
As discussed, follow-up with your kidney specialist or your primary provider.
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
## 2017-10-28 ENCOUNTER — Encounter: Payer: Self-pay | Admitting: Physician Assistant

## 2017-10-28 ENCOUNTER — Ambulatory Visit: Payer: BLUE CROSS/BLUE SHIELD | Admitting: Physician Assistant

## 2017-10-28 VITALS — BP 133/93 | HR 98 | Temp 99.3°F | Wt 307.0 lb

## 2017-10-28 DIAGNOSIS — I10 Essential (primary) hypertension: Secondary | ICD-10-CM

## 2017-10-28 DIAGNOSIS — Z9111 Patient's noncompliance with dietary regimen: Secondary | ICD-10-CM | POA: Diagnosis not present

## 2017-10-28 DIAGNOSIS — Z79899 Other long term (current) drug therapy: Secondary | ICD-10-CM

## 2017-10-28 DIAGNOSIS — M10022 Idiopathic gout, left elbow: Secondary | ICD-10-CM

## 2017-10-28 DIAGNOSIS — Z5181 Encounter for therapeutic drug level monitoring: Secondary | ICD-10-CM | POA: Diagnosis not present

## 2017-10-28 DIAGNOSIS — N184 Chronic kidney disease, stage 4 (severe): Secondary | ICD-10-CM | POA: Diagnosis not present

## 2017-10-28 DIAGNOSIS — Z91199 Patient's noncompliance with other medical treatment and regimen due to unspecified reason: Secondary | ICD-10-CM | POA: Insufficient documentation

## 2017-10-28 DIAGNOSIS — J22 Unspecified acute lower respiratory infection: Secondary | ICD-10-CM

## 2017-10-28 DIAGNOSIS — I5022 Chronic systolic (congestive) heart failure: Secondary | ICD-10-CM | POA: Insufficient documentation

## 2017-10-28 DIAGNOSIS — M10322 Gout due to renal impairment, left elbow: Secondary | ICD-10-CM

## 2017-10-28 MED ORDER — PREDNISONE 10 MG (48) PO TBPK: ORAL_TABLET | Freq: Every day | ORAL | 0 refills | Status: DC

## 2017-10-28 MED ORDER — DOXYCYCLINE HYCLATE 100 MG PO TABS: 100.0000 mg | ORAL_TABLET | Freq: Two times a day (BID) | ORAL | 0 refills | Status: AC

## 2017-10-28 MED ORDER — ALLOPURINOL 300 MG PO TABS: 300.0000 mg | ORAL_TABLET | Freq: Every day | ORAL | 3 refills | Status: DC
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
## 2017-10-28 NOTE — Progress Notes (Signed)
HPI:                                                                Zachary Lynch is a 32 y.o. male who presents to [place]: [place] [facility] today for cough and left elbow pain  Cough  This is a new problem. The current episode started in the past 7 days (6 days). The problem has been gradually worsening. The problem occurs every few minutes. The cough is productive of sputum. Pertinent negatives include no chest pain, chills, ear pain, fever, headaches, hemoptysis, myalgias, shortness of breath or wheezing. Nothing aggravates the symptoms. He has tried oral steroids for the symptoms. + CHF   CKD stage 4: he was hospitalized in August for gout of the left ankle, SIRS and acute on chronic renal failure (Scr>4). He never followed up with his nephrologist. He has not been to our office for hospital follow-up or medication management. He is supposed to see me at least every 3 months for monitoring medications. He has continued to use NSAIDs for gout flares, despite recommendations to avoid them.  Left elbow pain: pain and swelling beginning 4-5 days ago. He has limited ROM. Joint has felt warm. He has not been taking Allopurinol because he was lost to follow-up  HTN/HFrEF: taking Carvedilol and Amlodipine daily. Compliant with medications. Does not monitor BPs at home. He was referred to cardiology 6 months ago and never scheduled his appointment. Denies vision change, headache, chest pain with exertion, orthopnea, lightheadedness, syncope and edema. Risk factors include: obesity, HLD, male sex  Depression screen [facility] 2/9 03/28/2017  Decreased Interest 0  Down, Depressed, Hopeless 0  PHQ - 2 Score 0    No flowsheet data found.    Past Medical History:  Diagnosis Date  . Arthritis   . CKD stage 4 secondary to hypertension ([place]) 03/28/2017  . Elevated blood uric acid level   . Essential hypertension   . GERD (gastroesophageal reflux disease)   . Gout   .  History of cardiomyopathy    LVEF 15% in 2010 - evaluated by [facility] and Dr. Caryl Comes  . History of stroke 2010   Right MCA distribution infarct, felt to be possibly embolic in the setting of cardiomyopathy - placed on Coumadin at that time  . Hyperlipidemia   . Nonischemic cardiomyopathy ([place]) 03/28/2017  . Obesity   . Prediabetes 05/03/2017   No past surgical history on file. Social History   Tobacco Use  . Smoking status: Never Smoker  . Smokeless tobacco: Never Used  Substance Use Topics  . Alcohol use: No    Alcohol/week: 0.0 oz   family history includes Heart attack in his mother; Hypertension in his father and mother; Stroke in his cousin.    ROS: negative except as noted in the HPI  Medications: Current Outpatient Medications  Medication Sig Dispense Refill  . allopurinol (ZYLOPRIM) 300 MG tablet Take 1 tablet (300 mg total) by mouth daily. 30 tablet 3  . amLODipine (NORVASC) 10 MG tablet Take 10 mg by mouth daily.     Marland Kitchen atorvastatin (LIPITOR) 20 MG tablet Take 1 tablet (20 mg total) by mouth at bedtime. 90 tablet 3  . carvedilol (COREG) 25 MG tablet Take 50 mg  by mouth 2 (two) times daily with a meal.    . sucralfate (CARAFATE) 1 g tablet TAKE 1 TABLET(1 GRAM) BY MOUTH TWICE DAILY 120 tablet 0  . doxycycline (VIBRA-TABS) 100 MG tablet Take 1 tablet (100 mg total) by mouth 2 (two) times daily for 7 days. 14 tablet 0  . predniSONE (STERAPRED UNI-PAK 48 TAB) 10 MG (48) TBPK tablet Take by mouth daily. Use as directed for taper 1 tablet 0   No current facility-administered medications for this visit.    Allergies  Allergen Reactions  . Albuterol Other (See Comments)    Reaction is unknown. Allergy was entered post Stroke  . Tramadol Nausea Only    Made patient disoriented       Objective:  BP (!) 133/93   Pulse 98   Temp 99.3 F (37.4 C) (Oral)   Wt (!) 307 lb (139.3 kg)   SpO2 99%   BMI 40.50 kg/m  Gen:  alert, not ill-appearing, no distress, appropriate for  age, obese male HEENT: head normocephalic without obvious abnormality, conjunctiva and cornea clear, TM's clear bilaterally, oropharynx clear, moist mucous membranes, no adenopathy, neck supple, trachea midline Pulm: Normal work of breathing, normal phonation, breath sounds slightly diminished on expiration, no wheezes, rales or rhonchi CV: Normal rate, regular rhythm, s1 and s2 distinct, no murmurs, clicks or rubs  Neuro: alert and oriented x 3, no tremor MSK: extremities atraumatic, normal gait and station Skin: warm, slightly diaphoretic, no rashes on exposed skin, no jaundice, no cyanosis     No results found for this or any previous visit (from the past 72 hour(s)). No results found.    Assessment and Plan: 32 y.o. male with   1. Acute lower respiratory infection - DG Chest 2 View to assess for infiltrate - symptomatic care. Push PO fluids - he is being treated for gout by Sports physician with Doxycyline and Prednisone taper. This will cover him for any bacterial bronchitis/CAP  2. Chronic systolic heart failure (HCC) - no evidence of heart failure exacerbation, no worsening dyspnea or edema, weights are down, no peripheral edema on exam, SpO2 99% on RA - he is overdue for follow-up echo and cardiology follow-up. Explained that he will need to be followed by Cardiology for his HFrEF and I will order the Echo. He understands that I will not be managing his heart failure - B Nat Peptide - Ambulatory referral to Cardiology - ECHOCARDIOGRAM COMPLETE; Future  3. CKD (chronic kidney disease) stage 4, GFR 15-29 ml/min (HCC) - last Scr 4.7, 05/04/2017 - renally dosing his medications and avoiding nephrotoxins including NSAIDs - explained importance of follow-up with Nephrology - CBC - COMPLETE METABOLIC PANEL WITH GFR  4. Acute gout due to renal impairment involving left elbow - due to severe renal impairment and recurrent poorly controlled gout, Sports Medicine was consulted  today (see note). Follow-up in 1 week with Sports - COMPLETE METABOLIC PANEL WITH GFR - Uric acid - allopurinol (ZYLOPRIM) 300 MG tablet; Take 1 tablet (300 mg total) by mouth daily.  Dispense: 30 tablet; Refill: 3 - doxycycline (VIBRA-TABS) 100 MG tablet; Take 1 tablet (100 mg total) by mouth 2 (two) times daily for 7 days.  Dispense: 14 tablet; Refill: 0 - predniSONE (STERAPRED UNI-PAK 48 TAB) 10 MG (48) TBPK tablet; Take by mouth daily. Use as directed for taper  Dispense: 1 tablet; Refill: 0  5. Encounter for monitoring statin therapy - Lipid Panel w/reflex Direct LDL  6. Noncompliance with treatment  plan - has been told he needs to follow-up with Nephrology and Cardiology but he does not schedule his appointments - he has gone to urgent cares for his gout flares, but has not been seen in our office since July 2018. He is supposed to follow-up with me every 3 months  7. Hypertension BP Readings from Last 3 Encounters:  10/28/17 (!) 133/93  09/29/17 (!) 147/95  05/01/17 132/87  - need better blood pressure control to protect further damage to his kidneys - he is on max dose Amlodipine and Carvedilol. Waiting on renal function to determine appropriate add-on therapy. He also may be a candidate for Entresto if EF is still reduced, so I would like to wait for Echo. - counseled on therapeutic lifestyle changes - follow-up in 2 weeks   Patient education and anticipatory guidance given Patient agrees with treatment plan Follow-up in 2 weeks or sooner as needed if symptoms worsen or fail to improve  Darlyne Russian PA-C

## 2017-10-28 NOTE — Patient Instructions (Signed)
-   downstairs for chest x-ray today - return on Monday for your labs - take antibiotic and steroid as prescribed - follow-up with Dr. Darene Lamer in 1 week for your gout  I have also ordered fasting labs. The lab is a walk-in open M-F 7:30a-4:30p (closed 12:30-1:30p). Nothing to eat or drink after midnight or at least 8 hours before your blood draw. You can have water and your medications.

## 2017-10-28 NOTE — Assessment & Plan Note (Addendum)
Flare in the left elbow but no discrete collection over the olecranon bursa for aspiration and injection. I was very careful to image all aspects of the elbow joint itself, no effusion was noted to suggest that there is a septic joint. There is mostly subcutaneous involvement. Adding a taper of prednisone, doxycycline because there does appear to be some cellulitis. We are going to restart his allopurinol 300 mg daily. Return to see me at the end of next week. Holding off on indomethacin and colchicine considering current renal function.

## 2017-10-31 DIAGNOSIS — L989 Disorder of the skin and subcutaneous tissue, unspecified: Secondary | ICD-10-CM | POA: Diagnosis not present

## 2017-10-31 DIAGNOSIS — I5022 Chronic systolic (congestive) heart failure: Secondary | ICD-10-CM | POA: Diagnosis not present

## 2017-10-31 DIAGNOSIS — L731 Pseudofolliculitis barbae: Secondary | ICD-10-CM | POA: Diagnosis not present

## 2017-10-31 DIAGNOSIS — L730 Acne keloid: Secondary | ICD-10-CM | POA: Diagnosis not present

## 2017-10-31 DIAGNOSIS — L089 Local infection of the skin and subcutaneous tissue, unspecified: Secondary | ICD-10-CM | POA: Diagnosis not present

## 2017-10-31 DIAGNOSIS — Z79899 Other long term (current) drug therapy: Secondary | ICD-10-CM | POA: Diagnosis not present

## 2017-10-31 DIAGNOSIS — M10322 Gout due to renal impairment, left elbow: Secondary | ICD-10-CM | POA: Diagnosis not present

## 2017-10-31 DIAGNOSIS — L810 Postinflammatory hyperpigmentation: Secondary | ICD-10-CM | POA: Diagnosis not present

## 2017-10-31 DIAGNOSIS — N184 Chronic kidney disease, stage 4 (severe): Secondary | ICD-10-CM | POA: Diagnosis not present

## 2017-11-01 NOTE — Progress Notes (Signed)
Zachary Lynch,  Your kidney function is very very reduced. It's on the level of when you were hospitalized. You have stage 4 chronic kidney disease. Your current GFR is 17. If it gets below 15, you will need dialysis. You need to follow-up with your Nephrologist asap.  Stay away of the following over-the-counter medications and supplements: - NSAIDs (aspirin, ibuprofen, naproxen) - Decongestants (Sudafed/pseudaphedrine any cold med that says D in the name) - Antacids and Laxatives - Alka Seltzer - Herbals and dietary supplements  The good news is there is no evidence of a heart failure exacerbation. Your electrolytes look okay.  [name]

## 2017-11-02 LAB — CBC
HCT: 45.6 % (ref 38.5–50.0)
Hemoglobin: 15.6 g/dL (ref 13.2–17.1)
MCH: 29.9 pg (ref 27.0–33.0)
MCHC: 34.2 g/dL (ref 32.0–36.0)
MCV: 87.5 fL (ref 80.0–100.0)
MPV: 10.5 fL (ref 7.5–12.5)
PLATELETS: 382 10*3/uL (ref 140–400)
RBC: 5.21 10*6/uL (ref 4.20–5.80)
RDW: 14.2 % (ref 11.0–15.0)
WBC: 19.5 10*3/uL — ABNORMAL HIGH (ref 3.8–10.8)

## 2017-11-02 LAB — COMPLETE METABOLIC PANEL WITH GFR
AG RATIO: 1.1 (calc) (ref 1.0–2.5)
ALBUMIN MSPROF: 3.7 g/dL (ref 3.6–5.1)
ALKALINE PHOSPHATASE (APISO): 107 U/L (ref 40–115)
ALT: 23 U/L (ref 9–46)
AST: 15 U/L (ref 10–40)
BILIRUBIN TOTAL: 0.3 mg/dL (ref 0.2–1.2)
BUN / CREAT RATIO: 13 (calc) (ref 6–22)
BUN: 55 mg/dL — ABNORMAL HIGH (ref 7–25)
CHLORIDE: 107 mmol/L (ref 98–110)
CO2: 23 mmol/L (ref 20–32)
Calcium: 9 mg/dL (ref 8.6–10.3)
Creat: 4.22 mg/dL — ABNORMAL HIGH (ref 0.60–1.35)
GFR, EST AFRICAN AMERICAN: 20 mL/min/{1.73_m2} — AB (ref >=60)
GFR, Est Non African American: 17 mL/min/{1.73_m2} — ABNORMAL LOW (ref >=60)
GLUCOSE: 124 mg/dL — AB (ref 65–99)
Globulin: 3.4 g/dL (calc) (ref 1.9–3.7)
POTASSIUM: 4.2 mmol/L (ref 3.5–5.3)
SODIUM: 139 mmol/L (ref 135–146)
TOTAL PROTEIN: 7.1 g/dL (ref 6.1–8.1)

## 2017-11-02 LAB — HEMOGLOBIN A1C W/OUT EAG: HEMOGLOBIN A1C: 5.7 %{Hb} — AB (ref <5.7)

## 2017-11-02 LAB — LIPID PANEL W/REFLEX DIRECT LDL
Cholesterol: 150 mg/dL (ref <200)
HDL: 23 mg/dL — ABNORMAL LOW (ref >40)
LDL Cholesterol (Calc): 101 mg/dL (calc) — ABNORMAL HIGH
NON-HDL CHOLESTEROL (CALC): 127 mg/dL (ref <130)
Total CHOL/HDL Ratio: 6.5 (calc) — ABNORMAL HIGH (ref <5.0)
Triglycerides: 155 mg/dL — ABNORMAL HIGH (ref <150)

## 2017-11-02 LAB — TEST AUTHORIZATION

## 2017-11-02 LAB — URIC ACID: URIC ACID, SERUM: 7 mg/dL (ref 4.0–8.0)

## 2017-11-02 LAB — BRAIN NATRIURETIC PEPTIDE: Brain Natriuretic Peptide: 58 pg/mL (ref <100)

## 2017-11-03 ENCOUNTER — Encounter: Payer: Self-pay | Admitting: Physician Assistant

## 2017-11-04 ENCOUNTER — Ambulatory Visit: Payer: BLUE CROSS/BLUE SHIELD | Admitting: Sports Medicine

## 2017-11-04 ENCOUNTER — Encounter: Payer: Self-pay | Admitting: Sports Medicine

## 2017-11-04 DIAGNOSIS — M10022 Idiopathic gout, left elbow: Secondary | ICD-10-CM

## 2017-11-04 NOTE — Assessment & Plan Note (Signed)
I saw him last week with a flare in the left elbow that appeared to be gout, it was erythematous, slightly indurated. I image this with my ultrasound, there is no discrete collection such an olecranon bursitis to aspirate at the last visit, he also had no elbow joint effusion to suggest a septic joint. After a week of prednisone, doxycycline his symptoms have resolved completely. He will continue allopurinol 300 mg daily, this is a high dose for his renal function, but he does need aggressive uric acid lowering. He does have an appointment coming up with his nephrologist Zachary Lynch who I know very well, I would like Dr. Posey Pronto to check his uric acid levels at his next blood draw if he would be agreeable.

## 2017-11-04 NOTE — Progress Notes (Signed)
Subjective:    CC: Follow-up  HPI: This is a pleasant 32 year old male, he has chronic systolic heart failure, severe renal insufficiency, gout, we saw him about a week ago with a flare of what appeared to be gout in his left elbow, with erythema, warmth, induration.  At that time a bedside ultrasound did not show any collections consistent with a olecranon bursitis and no joint effusion to suggest septic joint.  We hit him hard with prednisone, doxycycline, he returns today completely symptom-free.  I reviewed the past medical history, family history, social history, surgical history, and allergies today and no changes were needed.  Please see the problem list section below in epic for further details.  Past Medical History: Past Medical History:  Diagnosis Date  . Arthritis   . CKD stage 4 secondary to hypertension ([place]) 03/28/2017  . Elevated blood uric acid level   . Essential hypertension   . GERD (gastroesophageal reflux disease)   . Gout   . History of cardiomyopathy    LVEF 15% in 2010 - evaluated by [facility] and Dr. Caryl Comes  . History of stroke 2010   Right MCA distribution infarct, felt to be possibly embolic in the setting of cardiomyopathy - placed on Coumadin at that time  . Hyperlipidemia   . Nonischemic cardiomyopathy ([place]) 03/28/2017  . Obesity   . Prediabetes 05/03/2017   Past Surgical History: No past surgical history on file. Social History: Social History   Socioeconomic History  . Marital status: Single    Spouse name: None  . Number of children: None  . Years of education: None  . Highest education level: None  Social Needs  . Financial resource strain: None  . Food insecurity - worry: None  . Food insecurity - inability: None  . Transportation needs - medical: None  . Transportation needs - non-medical: None  Occupational History  . None  Tobacco Use  . Smoking status: Never Smoker  . Smokeless tobacco: Never Used  Substance and Sexual Activity  .  Alcohol use: No    Alcohol/week: 0.0 oz  . Drug use: No  . Sexual activity: Not Currently  Other Topics Concern  . None  Social History Narrative  . None   Family History: Family History  Problem Relation Age of Onset  . Stroke Cousin   . Hypertension Mother   . Heart attack Mother        Died age 57  . Hypertension Father    Allergies: Allergies  Allergen Reactions  . Albuterol Other (See Comments)    Reaction is unknown. Allergy was entered post Stroke  . Tramadol Nausea Only    Made patient disoriented   Medications: See med rec.  Review of Systems: No fevers, chills, night sweats, weight loss, chest pain, or shortness of breath.   Objective:    General: Well Developed, well nourished, and in no acute distress.  Neuro: Alert and oriented x3, extra-ocular muscles intact, sensation grossly intact.  HEENT: Normocephalic, atraumatic, pupils equal round reactive to light, neck supple, no masses, no lymphadenopathy, thyroid nonpalpable.  Skin: Warm and dry, no rashes. Cardiac: Regular rate and rhythm, no murmurs rubs or gallops, no lower extremity edema.  Respiratory: Clear to auscultation bilaterally. Not using accessory muscles, speaking in full sentences. Left elbow: Unremarkable to inspection. Range of motion full pronation, supination, flexion, extension. Strength is full to all of the above directions Stable to varus, valgus stress. Negative moving valgus stress test. No discrete areas of tenderness  to palpation. Ulnar nerve does not sublux. Negative cubital tunnel Tinel's.  Impression and Recommendations:    Gout attack I saw him last week with a flare in the left elbow that appeared to be gout, it was erythematous, slightly indurated. I image this with my ultrasound, there is no discrete collection such an olecranon bursitis to aspirate at the last visit, he also had no elbow joint effusion to suggest a septic joint. After a week of prednisone, doxycycline his  symptoms have resolved completely. He will continue allopurinol 300 mg daily, this is a high dose for his renal function, but he does need aggressive uric acid lowering. He does have an appointment coming up with his nephrologist Elmarie Shiley who I know very well, I would like Dr. Posey Pronto to check his uric acid levels at his next blood draw if he would be agreeable.  ___________________________________________ Gwen Her. Dianah Field, M.D., ABFM., CAQSM. Primary Care and [place] Instructor of [place] [employer] [facility] [employer]

## 2017-11-09 ENCOUNTER — Encounter: Payer: Self-pay | Admitting: Sports Medicine

## 2017-11-09 DIAGNOSIS — N184 Chronic kidney disease, stage 4 (severe): Secondary | ICD-10-CM | POA: Diagnosis not present

## 2017-11-09 DIAGNOSIS — N2581 Secondary hyperparathyroidism of renal origin: Secondary | ICD-10-CM | POA: Diagnosis not present

## 2017-11-09 DIAGNOSIS — D631 Anemia in chronic kidney disease: Secondary | ICD-10-CM | POA: Diagnosis not present

## 2017-11-09 DIAGNOSIS — I129 Hypertensive chronic kidney disease with stage 1 through stage 4 chronic kidney disease, or unspecified chronic kidney disease: Secondary | ICD-10-CM | POA: Diagnosis not present

## 2017-11-20 ENCOUNTER — Other Ambulatory Visit: Payer: Self-pay | Admitting: Sports Medicine

## 2017-11-20 DIAGNOSIS — M10322 Gout due to renal impairment, left elbow: Secondary | ICD-10-CM

## 2017-11-21 ENCOUNTER — Encounter: Payer: Self-pay | Admitting: Sports Medicine

## 2017-11-21 DIAGNOSIS — M10322 Gout due to renal impairment, left elbow: Secondary | ICD-10-CM

## 2017-11-21 MED ORDER — PREDNISONE 10 MG (48) PO TBPK: ORAL_TABLET | Freq: Every day | ORAL | 0 refills | Status: DC

## 2017-11-24 ENCOUNTER — Encounter: Payer: Self-pay | Admitting: Physician Assistant

## 2017-11-24 ENCOUNTER — Other Ambulatory Visit: Payer: Self-pay | Admitting: Physician Assistant

## 2017-11-24 MED ORDER — CARVEDILOL 25 MG PO TABS: 50.0000 mg | ORAL_TABLET | Freq: Two times a day (BID) | ORAL | 0 refills | Status: DC

## 2017-11-27 DIAGNOSIS — N281 Cyst of kidney, acquired: Secondary | ICD-10-CM | POA: Diagnosis not present

## 2017-11-27 DIAGNOSIS — K529 Noninfective gastroenteritis and colitis, unspecified: Secondary | ICD-10-CM | POA: Diagnosis not present

## 2017-11-27 DIAGNOSIS — A09 Infectious gastroenteritis and colitis, unspecified: Secondary | ICD-10-CM | POA: Diagnosis not present

## 2017-11-27 DIAGNOSIS — I129 Hypertensive chronic kidney disease with stage 1 through stage 4 chronic kidney disease, or unspecified chronic kidney disease: Secondary | ICD-10-CM | POA: Diagnosis not present

## 2017-11-27 DIAGNOSIS — N189 Chronic kidney disease, unspecified: Secondary | ICD-10-CM | POA: Diagnosis not present

## 2017-11-27 DIAGNOSIS — N2889 Other specified disorders of kidney and ureter: Secondary | ICD-10-CM | POA: Diagnosis not present

## 2017-11-27 DIAGNOSIS — R197 Diarrhea, unspecified: Secondary | ICD-10-CM | POA: Diagnosis not present

## 2017-11-27 DIAGNOSIS — I880 Nonspecific mesenteric lymphadenitis: Secondary | ICD-10-CM | POA: Diagnosis not present

## 2017-11-29 ENCOUNTER — Encounter (HOSPITAL_BASED_OUTPATIENT_CLINIC_OR_DEPARTMENT_OTHER): Payer: Self-pay

## 2017-11-29 ENCOUNTER — Ambulatory Visit (HOSPITAL_BASED_OUTPATIENT_CLINIC_OR_DEPARTMENT_OTHER): Payer: BLUE CROSS/BLUE SHIELD

## 2017-12-01 ENCOUNTER — Encounter: Payer: Self-pay | Admitting: Physician Assistant

## 2017-12-01 DIAGNOSIS — A039 Shigellosis, unspecified: Secondary | ICD-10-CM

## 2017-12-01 MED ORDER — CIPROFLOXACIN HCL 500 MG PO TABS: 500.0000 mg | ORAL_TABLET | Freq: Two times a day (BID) | ORAL | 0 refills | Status: DC

## 2017-12-02 DIAGNOSIS — I10 Essential (primary) hypertension: Secondary | ICD-10-CM | POA: Diagnosis not present

## 2017-12-02 DIAGNOSIS — R109 Unspecified abdominal pain: Secondary | ICD-10-CM | POA: Diagnosis not present

## 2017-12-02 DIAGNOSIS — K219 Gastro-esophageal reflux disease without esophagitis: Secondary | ICD-10-CM | POA: Diagnosis not present

## 2017-12-26 ENCOUNTER — Other Ambulatory Visit: Payer: Self-pay | Admitting: Physician Assistant

## 2017-12-28 ENCOUNTER — Encounter: Payer: Self-pay | Admitting: Sports Medicine

## 2017-12-28 DIAGNOSIS — M10322 Gout due to renal impairment, left elbow: Secondary | ICD-10-CM

## 2017-12-28 MED ORDER — PREDNISONE 10 MG (48) PO TBPK: ORAL_TABLET | Freq: Every day | ORAL | 0 refills | Status: DC

## 2017-12-29 ENCOUNTER — Other Ambulatory Visit: Payer: Self-pay | Admitting: Physician Assistant

## 2017-12-29 DIAGNOSIS — K219 Gastro-esophageal reflux disease without esophagitis: Secondary | ICD-10-CM

## 2018-01-21 ENCOUNTER — Other Ambulatory Visit: Payer: Self-pay | Admitting: Physician Assistant

## 2018-01-30 DIAGNOSIS — N2581 Secondary hyperparathyroidism of renal origin: Secondary | ICD-10-CM | POA: Diagnosis not present

## 2018-01-30 DIAGNOSIS — N184 Chronic kidney disease, stage 4 (severe): Secondary | ICD-10-CM | POA: Diagnosis not present

## 2018-01-30 LAB — BASIC METABOLIC PANEL
BUN: 24 — AB (ref 4–21)
Creatinine: 3.3 — AB (ref 0.6–1.3)
Glucose: 101
Potassium: 4.3 (ref 3.4–5.3)
Sodium: 144 (ref 137–147)

## 2018-01-30 LAB — CMP14+EGFR
CHLORIDE: 107
CO2: 19
Calcium: 9
GFR CALC AF AMER: 27
Magnesium: 2.1

## 2018-01-30 LAB — VITAMIN D 25 HYDROXY (VIT D DEFICIENCY, FRACTURES): Vit D, 25-Hydroxy: 5.9

## 2018-01-30 LAB — PTH, INTACT: PTH, Intact: 226

## 2018-01-30 LAB — URIC ACID: Uric Acid: 5.3

## 2018-02-05 ENCOUNTER — Other Ambulatory Visit: Payer: Self-pay | Admitting: Sports Medicine

## 2018-02-05 DIAGNOSIS — M10322 Gout due to renal impairment, left elbow: Secondary | ICD-10-CM

## 2018-02-06 ENCOUNTER — Encounter: Payer: Self-pay | Admitting: Sports Medicine

## 2018-02-06 DIAGNOSIS — M10322 Gout due to renal impairment, left elbow: Secondary | ICD-10-CM

## 2018-02-06 DIAGNOSIS — M10371 Gout due to renal impairment, right ankle and foot: Secondary | ICD-10-CM

## 2018-02-07 MED ORDER — PREDNISONE 10 MG (48) PO TBPK: ORAL_TABLET | Freq: Every day | ORAL | 0 refills | Status: DC

## 2018-02-07 NOTE — Addendum Note (Signed)
Addended by: Silverio Decamp on: 02/07/2018 09:30 AM   Modules accepted: Orders

## 2018-02-14 DIAGNOSIS — M10371 Gout due to renal impairment, right ankle and foot: Secondary | ICD-10-CM | POA: Diagnosis not present

## 2018-02-15 LAB — BASIC METABOLIC PANEL
BUN/Creatinine Ratio: 15 (calc) (ref 6–22)
CO2: 20 mmol/L (ref 20–32)
Calcium: 8.6 mg/dL (ref 8.6–10.3)
Glucose, Bld: 107 mg/dL — ABNORMAL HIGH (ref 65–99)

## 2018-02-15 LAB — BASIC METABOLIC PANEL WITH GFR
BUN: 47 mg/dL — ABNORMAL HIGH (ref 7–25)
Chloride: 111 mmol/L — ABNORMAL HIGH (ref 98–110)
Creat: 3.13 mg/dL — ABNORMAL HIGH (ref 0.60–1.35)
Potassium: 3.8 mmol/L (ref 3.5–5.3)
Sodium: 142 mmol/L (ref 135–146)

## 2018-02-15 LAB — URIC ACID: Uric Acid, Serum: 5.1 mg/dL (ref 4.0–8.0)

## 2018-02-27 ENCOUNTER — Other Ambulatory Visit: Payer: Self-pay | Admitting: Physician Assistant

## 2018-03-03 ENCOUNTER — Other Ambulatory Visit: Payer: Self-pay | Admitting: Physician Assistant

## 2018-03-25 ENCOUNTER — Other Ambulatory Visit: Payer: Self-pay | Admitting: Sports Medicine

## 2018-03-25 DIAGNOSIS — M10322 Gout due to renal impairment, left elbow: Secondary | ICD-10-CM

## 2018-03-28 ENCOUNTER — Encounter: Payer: Self-pay | Admitting: Sports Medicine

## 2018-03-28 ENCOUNTER — Other Ambulatory Visit: Payer: Self-pay | Admitting: Sports Medicine

## 2018-03-28 DIAGNOSIS — M10322 Gout due to renal impairment, left elbow: Secondary | ICD-10-CM

## 2018-03-29 ENCOUNTER — Other Ambulatory Visit: Payer: Self-pay | Admitting: Physician Assistant

## 2018-03-29 MED ORDER — PREDNISONE 10 MG (48) PO TBPK: ORAL_TABLET | Freq: Every day | ORAL | 0 refills | Status: DC

## 2018-03-30 ENCOUNTER — Encounter: Payer: Self-pay | Admitting: Physician Assistant

## 2018-04-04 DIAGNOSIS — N2581 Secondary hyperparathyroidism of renal origin: Secondary | ICD-10-CM | POA: Diagnosis not present

## 2018-04-04 DIAGNOSIS — D631 Anemia in chronic kidney disease: Secondary | ICD-10-CM | POA: Diagnosis not present

## 2018-04-04 DIAGNOSIS — N184 Chronic kidney disease, stage 4 (severe): Secondary | ICD-10-CM | POA: Diagnosis not present

## 2018-04-04 DIAGNOSIS — I10 Essential (primary) hypertension: Secondary | ICD-10-CM | POA: Diagnosis not present

## 2018-04-04 LAB — PTH, INTACT: PTH, Intact: 297

## 2018-04-04 LAB — VITAMIN D 25 HYDROXY (VIT D DEFICIENCY, FRACTURES): Vit D, 25-Hydroxy: 6.2

## 2018-04-25 ENCOUNTER — Other Ambulatory Visit: Payer: Self-pay | Admitting: Physician Assistant

## 2018-04-25 ENCOUNTER — Other Ambulatory Visit: Payer: Self-pay | Admitting: Sports Medicine

## 2018-04-25 DIAGNOSIS — M10322 Gout due to renal impairment, left elbow: Secondary | ICD-10-CM

## 2018-05-07 ENCOUNTER — Other Ambulatory Visit: Payer: Self-pay | Admitting: Physician Assistant

## 2018-05-08 ENCOUNTER — Encounter: Payer: Self-pay | Admitting: Physician Assistant

## 2018-05-08 DIAGNOSIS — N184 Chronic kidney disease, stage 4 (severe): Secondary | ICD-10-CM

## 2018-05-09 ENCOUNTER — Telehealth: Payer: Self-pay | Admitting: Physician Assistant

## 2018-05-09 NOTE — Telephone Encounter (Signed)
I called [facility] and they do not work from a workqueue. They are going to call patient and get him scheduled. - CF

## 2018-05-12 ENCOUNTER — Other Ambulatory Visit: Payer: Self-pay | Admitting: Physician Assistant

## 2018-05-12 DIAGNOSIS — E785 Hyperlipidemia, unspecified: Secondary | ICD-10-CM

## 2018-05-20 ENCOUNTER — Other Ambulatory Visit: Payer: Self-pay | Admitting: Physician Assistant

## 2018-05-23 ENCOUNTER — Other Ambulatory Visit: Payer: Self-pay | Admitting: Sports Medicine

## 2018-05-23 DIAGNOSIS — M10322 Gout due to renal impairment, left elbow: Secondary | ICD-10-CM

## 2018-05-24 ENCOUNTER — Ambulatory Visit: Payer: BLUE CROSS/BLUE SHIELD | Admitting: Physician Assistant

## 2018-05-24 ENCOUNTER — Encounter: Payer: Self-pay | Admitting: Physician Assistant

## 2018-05-24 ENCOUNTER — Ambulatory Visit (INDEPENDENT_AMBULATORY_CARE_PROVIDER_SITE_OTHER): Payer: BLUE CROSS/BLUE SHIELD | Admitting: Physician Assistant

## 2018-05-24 VITALS — BP 172/132 | HR 83 | Wt 315.0 lb

## 2018-05-24 DIAGNOSIS — Z8673 Personal history of transient ischemic attack (TIA), and cerebral infarction without residual deficits: Secondary | ICD-10-CM

## 2018-05-24 DIAGNOSIS — I10 Essential (primary) hypertension: Secondary | ICD-10-CM | POA: Diagnosis not present

## 2018-05-24 DIAGNOSIS — Z6841 Body Mass Index (BMI) 40.0 and over, adult: Secondary | ICD-10-CM

## 2018-05-24 DIAGNOSIS — N184 Chronic kidney disease, stage 4 (severe): Secondary | ICD-10-CM

## 2018-05-24 DIAGNOSIS — I428 Other cardiomyopathies: Secondary | ICD-10-CM

## 2018-05-24 MED ORDER — AMLODIPINE BESYLATE 10 MG PO TABS: 10.0000 mg | ORAL_TABLET | Freq: Every day | ORAL | 1 refills | Status: DC

## 2018-05-24 MED ORDER — CARVEDILOL 25 MG PO TABS: ORAL_TABLET | ORAL | 0 refills | Status: DC

## 2018-05-24 MED ORDER — ASPIRIN EC 81 MG PO TBEC: 81.0000 mg | DELAYED_RELEASE_TABLET | Freq: Every day | ORAL | 3 refills | Status: DC

## 2018-05-24 NOTE — Progress Notes (Signed)
HPI:                                                                Zachary Lynch is a 32 y.o. male who presents to [place] today for medication management  HTN: due to miscommunication, he never requested a refill of his Amlodipine and he has not taken this medication in the last year. He ran out of his Carvedilol a few days ago. States normal home BP is 140-150/90-100. Endorses occasional pedal edema with prolonged standing. Denies vision change, headache, chest pain with exertion, orthopnea, lightheadedness, syncope. Risk factors include: hx of CVA, obesity, HLD  CKD: He saw [facility] last month. Scr 3.13 Olmesartan was increased to 40 mg daily.   No flowsheet data found.    Past Medical History:  Diagnosis Date  . Arthritis   . CKD stage 4 secondary to hypertension ([place]) 03/28/2017  . Elevated blood uric acid level   . Essential hypertension   . GERD (gastroesophageal reflux disease)   . Gout   . History of cardiomyopathy    LVEF 15% in 2010 - evaluated by [facility] and Dr. Caryl Comes  . History of stroke 2010   Right MCA distribution infarct, felt to be possibly embolic in the setting of cardiomyopathy - placed on Coumadin at that time  . Hyperlipidemia   . Nonischemic cardiomyopathy ([place]) 03/28/2017  . Obesity   . Prediabetes 05/03/2017   History reviewed. No pertinent surgical history. Social History   Tobacco Use  . Smoking status: Never Smoker  . Smokeless tobacco: Never Used  Substance Use Topics  . Alcohol use: No    Alcohol/week: 0.0 standard drinks   family history includes Heart attack in his mother; Hypertension in his father and mother; Stroke in his cousin.    ROS: negative except as noted in the HPI  Medications: Current Outpatient Medications  Medication Sig Dispense Refill  . allopurinol (ZYLOPRIM) 300 MG tablet TAKE 1 TABLET(300 MG) BY MOUTH DAILY 30 tablet 0  . atorvastatin (LIPITOR) 20  MG tablet Take 1 tablet (20 mg total) by mouth at bedtime. MUST MAKE APPOINTMENT 90 tablet 0  . ciprofloxacin (CIPRO) 500 MG tablet Take 1 tablet (500 mg total) by mouth 2 (two) times daily. 6 tablet 0  . clindamycin (CLINDAGEL) 1 % gel Apply topically.    Marland Kitchen olmesartan (BENICAR) 40 MG tablet Take 40 mg by mouth daily.    . sucralfate (CARAFATE) 1 g tablet TAKE 1 TABLET(1 GRAM) BY MOUTH TWICE DAILY 120 tablet 0  . tretinoin (RETIN-A) 0.025 % cream Apply pea-sized amount to face and neck nightly.    Marland Kitchen amLODipine (NORVASC) 10 MG tablet Take 1 tablet (10 mg total) by mouth daily. 90 tablet 1  . aspirin EC 81 MG tablet Take 1 tablet (81 mg total) by mouth daily. 90 tablet 3  . carvedilol (COREG) 25 MG tablet TAKE 1 TABLET(25 MG) BY MOUTH TWICE DAILY WITH A MEAL 180 tablet 0  . ranitidine (ZANTAC) 300 MG capsule   2   No current facility-administered medications for this visit.    Allergies  Allergen Reactions  . Albuterol Other (See Comments)    Reaction is unknown. Allergy was entered post Stroke  .  Tramadol Nausea Only    Made patient disoriented       Objective:  BP (!) 172/132   Pulse 83   Wt (!) 315 lb (142.9 kg)   SpO2 97%   BMI 41.56 kg/m  Gen:  alert, not ill-appearing, no distress, appropriate for age, obese male HEENT: head normocephalic without obvious abnormality, conjunctiva and cornea clear, trachea midline, no carotid bruit Pulm: Normal work of breathing, normal phonation, clear to auscultation bilaterally, no wheezes, rales or rhonchi CV: Normal rate, regular rhythm, s1 and s2 distinct, no murmurs, clicks or rubs  Neuro: alert and oriented x 3, no tremor MSK: extremities atraumatic, normal gait and station, trace peripheral edema at the ankle bilaterally Skin: intact, no rashes on exposed skin, no jaundice, no cyanosis Psych: well-groomed, cooperative, good eye contact, euthymic mood, affect mood-congruent, speech is articulate, and thought processes clear and  goal-directed    No results found for this or any previous visit (from the past 72 hour(s)). No results found.    Assessment and Plan: 32 y.o. male with   .Diagnoses and all orders for this visit:  Uncontrolled stage 2 hypertension -     carvedilol (COREG) 25 MG tablet; TAKE 1 TABLET(25 MG) BY MOUTH TWICE DAILY WITH A MEAL -     amLODipine (NORVASC) 10 MG tablet; Take 1 tablet (10 mg total) by mouth daily. -     aspirin EC 81 MG tablet; Take 1 tablet (81 mg total) by mouth daily.  CKD (chronic kidney disease) stage 4, GFR 15-29 ml/min (HCC)  History of stroke -     aspirin EC 81 MG tablet; Take 1 tablet (81 mg total) by mouth daily.  Class 3 severe obesity due to excess calories with serious comorbidity and body mass index (BMI) of 40.0 to 44.9 in adult [facility])  Chronic systolic heart failure (HCC)    BP Readings from Last 3 Encounters:  05/24/18 (!) 172/132  11/04/17 (!) 142/78  10/28/17 (!) 133/93  - poorly controlled HTN due to medication non-compliance - refilled Amlodipine 10 mg and Carvedilol 25 mg bid - cont Olmesartan 40 mg - keep f/u with Heart Care, depending on Echo, he may be a good candidate for Entresto - discussed that his medication may change when he is evaluated by Cardiology - baby asa for secondary prevention - cont statin, LDL goal <70 - counseled on therapeutic lifestyle changes. Provided with information on weight loss medication (Belviq, Saxenda, Contrave)   Patient education and anticipatory guidance given Patient agrees with treatment plan Follow-up in 6 months or sooner as needed if symptoms worsen or fail to improve  Darlyne Russian PA-C

## 2018-05-24 NOTE — Patient Instructions (Addendum)
For your blood pressure: - Goal <130/80 - baby aspirin 81 mg daily to help prevent heart attack/stroke - monitor and log blood pressures at home - check around the same time each day in a relaxed setting - Limit salt to <2000 mg/day - Follow DASH eating plan - limit alcohol to 2 standard drinks per day for men and 1 per day for women - avoid tobacco products - weight loss: 7% of current body weight - follow-up every 6 months for your blood pressure    DASH Eating Plan DASH stands for "Dietary Approaches to Stop Hypertension." The DASH eating plan is a healthy eating plan that has been shown to reduce high blood pressure (hypertension). It may also reduce your risk for type 2 diabetes, heart disease, and stroke. The DASH eating plan may also help with weight loss. What are tips for following this plan? General guidelines  Avoid eating more than 2,300 mg (milligrams) of salt (sodium) a day. If you have hypertension, you may need to reduce your sodium intake to 1,500 mg a day.  Limit alcohol intake to no more than 1 drink a day for nonpregnant women and 2 drinks a day for men. One drink equals 12 oz of beer, 5 oz of wine, or 1 oz of hard liquor.  Work with your health care provider to maintain a healthy body weight or to lose weight. Ask what an ideal weight is for you.  Get at least 30 minutes of exercise that causes your heart to beat faster (aerobic exercise) most days of the week. Activities may include walking, swimming, or biking.  Work with your health care provider or diet and nutrition specialist (dietitian) to adjust your eating plan to your individual calorie needs. Reading food labels  Check food labels for the amount of sodium per serving. Choose foods with less than 5 percent of the Daily Value of sodium. Generally, foods with less than 300 mg of sodium per serving fit into this eating plan.  To find whole grains, look for the word "whole" as the first word in the  ingredient list. Shopping  Buy products labeled as "low-sodium" or "no salt added."  Buy fresh foods. Avoid canned foods and premade or frozen meals. Cooking  Avoid adding salt when cooking. Use salt-free seasonings or herbs instead of table salt or sea salt. Check with your health care provider or pharmacist before using salt substitutes.  Do not fry foods. Cook foods using healthy methods such as baking, boiling, grilling, and broiling instead.  Cook with heart-healthy oils, such as olive, canola, soybean, or sunflower oil. Meal planning   Eat a balanced diet that includes: ? 5 or more servings of fruits and vegetables each day. At each meal, try to fill half of your plate with fruits and vegetables. ? Up to 6-8 servings of whole grains each day. ? Less than 6 oz of lean meat, poultry, or fish each day. A 3-oz serving of meat is about the same size as a deck of cards. One egg equals 1 oz. ? 2 servings of low-fat dairy each day. ? A serving of nuts, seeds, or beans 5 times each week. ? Heart-healthy fats. Healthy fats called Omega-3 fatty acids are found in foods such as flaxseeds and coldwater fish, like sardines, salmon, and mackerel.  Limit how much you eat of the following: ? Canned or prepackaged foods. ? Food that is high in trans fat, such as fried foods. ? Food that is high in  saturated fat, such as fatty meat. ? Sweets, desserts, sugary drinks, and other foods with added sugar. ? Full-fat dairy products.  Do not salt foods before eating.  Try to eat at least 2 vegetarian meals each week.  Eat more home-cooked food and less restaurant, buffet, and fast food.  When eating at a restaurant, ask that your food be prepared with less salt or no salt, if possible. What foods are recommended? The items listed may not be a complete list. Talk with your dietitian about what dietary choices are best for you. Grains Whole-grain or whole-wheat bread. Whole-grain or whole-wheat  pasta. Brown rice. [place]. Bulgur. Whole-grain and low-sodium cereals. Pita bread. Low-fat, low-sodium crackers. Whole-wheat flour tortillas. Vegetables Fresh or frozen vegetables (raw, steamed, roasted, or grilled). Low-sodium or reduced-sodium tomato and vegetable juice. Low-sodium or reduced-sodium tomato sauce and tomato paste. Low-sodium or reduced-sodium canned vegetables. Fruits All fresh, dried, or frozen fruit. Canned fruit in natural juice (without added sugar). Meat and other protein foods Skinless chicken or [place]. Ground chicken or [place]. Pork with fat trimmed off. Fish and seafood. Egg whites. Dried beans, peas, or lentils. Unsalted nuts, nut butters, and seeds. Unsalted canned beans. Lean cuts of beef with fat trimmed off. Low-sodium, lean deli meat. Dairy Low-fat (1%) or fat-free (skim) milk. Fat-free, low-fat, or reduced-fat cheeses. Nonfat, low-sodium ricotta or cottage cheese. Low-fat or nonfat yogurt. Low-fat, low-sodium cheese. Fats and oils Soft margarine without trans fats. Vegetable oil. Low-fat, reduced-fat, or light mayonnaise and salad dressings (reduced-sodium). Canola, safflower, olive, soybean, and sunflower oils. Avocado. Seasoning and other foods Herbs. Spices. Seasoning mixes without salt. Unsalted popcorn and pretzels. Fat-free sweets. What foods are not recommended? The items listed may not be a complete list. Talk with your dietitian about what dietary choices are best for you. Grains Baked goods made with fat, such as croissants, muffins, or some breads. Dry pasta or rice meal packs. Vegetables Creamed or fried vegetables. Vegetables in a cheese sauce. Regular canned vegetables (not low-sodium or reduced-sodium). Regular canned tomato sauce and paste (not low-sodium or reduced-sodium). Regular tomato and vegetable juice (not low-sodium or reduced-sodium). Angie Fava. Olives. Fruits Canned fruit in a light or heavy syrup. Fried fruit. Fruit in cream or  butter sauce. Meat and other protein foods Fatty cuts of meat. Ribs. Fried meat. Berniece Salines. Sausage. Bologna and other processed lunch meats. Salami. Fatback. Hotdogs. Bratwurst. Salted nuts and seeds. Canned beans with added salt. Canned or smoked fish. Whole eggs or egg yolks. Chicken or [place] with skin. Dairy Whole or 2% milk, cream, and half-and-half. Whole or full-fat cream cheese. Whole-fat or sweetened yogurt. Full-fat cheese. Nondairy creamers. Whipped toppings. Processed cheese and cheese spreads. Fats and oils Butter. Stick margarine. Lard. Shortening. Ghee. Bacon fat. Tropical oils, such as coconut, palm kernel, or palm oil. Seasoning and other foods Salted popcorn and pretzels. Onion salt, garlic salt, seasoned salt, table salt, and sea salt. Worcestershire sauce. Tartar sauce. Barbecue sauce. Teriyaki sauce. Soy sauce, including reduced-sodium. Steak sauce. Canned and packaged gravies. Fish sauce. Oyster sauce. Cocktail sauce. Horseradish that you find on the shelf. Ketchup. Mustard. Meat flavorings and tenderizers. Bouillon cubes. Hot sauce and Tabasco sauce. Premade or packaged marinades. Premade or packaged taco seasonings. Relishes. Regular salad dressings. Where to find more information:  National Heart, Lung, and [place]: https://wilson-eaton.com/  American Heart Association: www.heart.org Summary  The DASH eating plan is a healthy eating plan that has been shown to reduce high blood pressure (hypertension). It may also  reduce your risk for type 2 diabetes, heart disease, and stroke.  With the DASH eating plan, you should limit salt (sodium) intake to 2,300 mg a day. If you have hypertension, you may need to reduce your sodium intake to 1,500 mg a day.  When on the DASH eating plan, aim to eat more fresh fruits and vegetables, whole grains, lean proteins, low-fat dairy, and heart-healthy fats.  Work with your health care provider or diet and nutrition specialist (dietitian) to  adjust your eating plan to your individual calorie needs. This information is not intended to replace advice given to you by your health care provider. Make sure you discuss any questions you have with your health care provider. Document Released: 08/26/2011 Document Revised: 08/30/2016 Document Reviewed: 08/30/2016 Elsevier Interactive Patient Education  [employer].

## 2018-05-25 ENCOUNTER — Encounter: Payer: Self-pay | Admitting: Physician Assistant

## 2018-05-26 ENCOUNTER — Encounter (HOSPITAL_COMMUNITY): Payer: Self-pay | Admitting: Internal Medicine

## 2018-05-26 ENCOUNTER — Ambulatory Visit (HOSPITAL_COMMUNITY)
Admission: RE | Admit: 2018-05-26 | Discharge: 2018-05-26 | Disposition: A | Discharge status: Home / Self Care | Payer: BLUE CROSS/BLUE SHIELD | Source: Ambulatory Visit | Attending: Internal Medicine | Admitting: Internal Medicine

## 2018-05-26 ENCOUNTER — Other Ambulatory Visit: Payer: Self-pay

## 2018-05-26 VITALS — BP 174/116 | HR 89 | Wt 312.5 lb

## 2018-05-26 DIAGNOSIS — Z79899 Other long term (current) drug therapy: Secondary | ICD-10-CM | POA: Diagnosis not present

## 2018-05-26 DIAGNOSIS — I429 Cardiomyopathy, unspecified: Secondary | ICD-10-CM | POA: Diagnosis not present

## 2018-05-26 DIAGNOSIS — R7303 Prediabetes: Secondary | ICD-10-CM | POA: Diagnosis not present

## 2018-05-26 DIAGNOSIS — Z888 Allergy status to other drugs, medicaments and biological substances status: Secondary | ICD-10-CM | POA: Insufficient documentation

## 2018-05-26 DIAGNOSIS — Z7982 Long term (current) use of aspirin: Secondary | ICD-10-CM | POA: Diagnosis not present

## 2018-05-26 DIAGNOSIS — M109 Gout, unspecified: Secondary | ICD-10-CM | POA: Insufficient documentation

## 2018-05-26 DIAGNOSIS — E785 Hyperlipidemia, unspecified: Secondary | ICD-10-CM | POA: Insufficient documentation

## 2018-05-26 DIAGNOSIS — N184 Chronic kidney disease, stage 4 (severe): Secondary | ICD-10-CM | POA: Insufficient documentation

## 2018-05-26 DIAGNOSIS — I5022 Chronic systolic (congestive) heart failure: Secondary | ICD-10-CM

## 2018-05-26 DIAGNOSIS — I10 Essential (primary) hypertension: Secondary | ICD-10-CM | POA: Diagnosis not present

## 2018-05-26 DIAGNOSIS — Z8673 Personal history of transient ischemic attack (TIA), and cerebral infarction without residual deficits: Secondary | ICD-10-CM | POA: Diagnosis not present

## 2018-05-26 DIAGNOSIS — I130 Hypertensive heart and chronic kidney disease with heart failure and stage 1 through stage 4 chronic kidney disease, or unspecified chronic kidney disease: Secondary | ICD-10-CM | POA: Diagnosis not present

## 2018-05-26 MED ORDER — ISOSORB DINITRATE-HYDRALAZINE 20-37.5 MG PO TABS: ORAL_TABLET | ORAL | 3 refills | Status: DC

## 2018-05-26 NOTE — Progress Notes (Signed)
[facility] Consult Note  Date: 05/26/2018   ID: Zachary Lynch, DOB 1986-02-05, MRN [id number]  PCP: [name], PA-C  Primary Cardiologist: Zachary Bickers, MD   History of Present Illness:  Zachary Lynch is a 32 y.o. male with a history of systolic HF due to nonischemic cardiomyopathy that was diagnosed when he had a stroke in 12-17-2008. History also includes morbid obesity, CKD IV, severe HTN and gout. Initially followed Dr. Elisabeth Cara at [facility] and then saw Dr. Domenic Polite in 2014/12/18. Has not seen a cardiologist since. Recently switched PCPs and now referred back to HF Clinic by Zachary Chimes PA-C for follow-up on his HF.     Back in Dec 17, 2008 LVEF was 15%. In 12/18/14 EF was 25%. Has not had an echo since. Scheduled for repeat echo 06/06/18.   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 Aside from working he is not that active but quite active at work. Can do all ADLs and go to store and walk up a flight or two of steps without a problem. No CP. Thinks he snores but doesn't know. Was scheduled to have sleep study in past but never did it. Baseline creatinine 3.0-3.3. Follows with Dr. Posey Pronto,.Weighs most days but not every day. Very good about taking meds. Was on hydralazine in past but didn't tolerate it. SBP has been running high. Typically in [place]. No focal neuro symptoms. No edema, orthopnea or PND.     Past Medical History:  Diagnosis Date  . Arthritis   . CKD stage 4 secondary to hypertension ([place]) 03/28/2017  . Elevated blood uric acid level   . Essential hypertension   . GERD (gastroesophageal reflux disease)   . Gout   . History of cardiomyopathy    LVEF 15% in 12-17-2008 - evaluated by [facility] and Dr. Caryl Comes  . History of stroke Dec 17, 2008   Right MCA distribution infarct, felt to be possibly embolic in the setting of cardiomyopathy - placed on Coumadin at that time  . Hyperlipidemia   . Nonischemic cardiomyopathy ([place]) 03/28/2017  . Obesity   . Prediabetes 05/03/2017      Current Outpatient Medications  Medication Sig Dispense Refill  . allopurinol (ZYLOPRIM) 300 MG tablet TAKE 1 TABLET(300 MG) BY MOUTH DAILY 30 tablet 0  . amLODipine (NORVASC) 10 MG tablet Take 1 tablet (10 mg total) by mouth daily. 90 tablet 1  . aspirin EC 81 MG tablet Take 1 tablet (81 mg total) by mouth daily. 90 tablet 3  . atorvastatin (LIPITOR) 20 MG tablet Take 1 tablet (20 mg total) by mouth at bedtime. MUST MAKE APPOINTMENT 90 tablet 0  . carvedilol (COREG) 25 MG tablet Take 50 mg by mouth 2 (two) times daily with a meal.    . clindamycin (CLINDAGEL) 1 % gel Apply topically.    . ranitidine (ZANTAC) 300 MG capsule   2  . sucralfate (CARAFATE) 1 g tablet TAKE 1 TABLET(1 GRAM) BY MOUTH TWICE DAILY 120 tablet 0  . tretinoin (RETIN-A) 0.025 % cream Apply pea-sized amount to face and neck nightly.     No current facility-administered medications for this encounter.     Allergies:  Albuterol and Tramadol   Social History: The patient  reports that he has never smoked. He has never used smokeless tobacco. He reports that he does not drink alcohol or use drugs.   Family history: Mom had kidney disease - died in December 18, 2014 Doesn't know about Dad's side Denies  h/o premature CAD of FHx of cardiomyopathy  ROS:  Please see the history of present illness. Otherwise, complete review of systems is positive for none.  All other systems are reviewed and negative.   Physical Exam: VS:  BP (!) 174/116   Pulse 89   Wt (!) 141.7 kg (312 lb 8 oz)   SpO2 99%   BMI 41.23 kg/m , BMI Body mass index is 41.23 kg/m.  Wt Readings from Last 3 Encounters:  05/26/18 (!) 141.7 kg (312 lb 8 oz)  05/24/18 (!) 142.9 kg (315 lb)  11/04/17 (!) 141.1 kg (311 lb)    General:  Well appearing. No resp difficulty HEENT: normal Neck: supple. no JVD. Carotids 2+ bilat; no bruits. No lymphadenopathy or thryomegaly appreciated. Cor: PMI nonpalpable. Regular rate & rhythm. No rubs, gallops or murmurs. Lungs:  clear Abdomen: obese soft, nontender, nondistended. No hepatosplenomegaly. No bruits or masses. Good bowel sounds. Extremities: no cyanosis, clubbing, rash, edema Neuro: alert & orientedx3, cranial nerves grossly intact. moves all 4 extremities w/o difficulty. Affect pleasant  ECG: ECG is not ordered today.  Recent Labwork:  November 2016: Hemoglobin 17.0, platelets 224, BUN 27, creatinine 2.8 (previously 2.3 in November 2015), potassium 4.0, AST 27, ALT 32, cholesterol 188, triglycerides 214, HDL 25, LDL 120  Other Studies Reviewed Today:  Echocardiogram 08/01/2015: Study Conclusions  - Left ventricle: The cavity size was mildly dilated. Wall thickness was increased increased in a pattern of mild to moderate LVH. Systolic function was severely reduced. The estimated ejection fraction was 25%. Diffuse hypokinesis. Features are consistent with a pseudonormal left ventricular filling pattern, with concomitant abnormal relaxation and increased filling pressure (grade 2 diastolic dysfunction). - Aortic valve: Mildly calcified annulus. Trileaflet. There was trivial regurgitation. - Aortic root: The aortic root was mildly ectatic. - Mitral valve: There was trivial regurgitation. - Left atrium: The atrium was mildly dilated. - Right atrium: Central venous pressure (est): 3 mm Hg. - Tricuspid valve: There was trivial regurgitation. - Pulmonary arteries: Systolic pressure could not be accurately estimated. - Pericardium, extracardiac: A trivial pericardial effusion was identified.  Impressions:  - Mild to moderate LVH with mild chamber dilatation and LVEF approximately 25%. There is diffuse hypokinesis and grade 2 diastolic dysfunction. Mild left atrial enlargement. Mildly ectatic aortic root. Trivial aortic regurgitation. Trivial tricuspid regurgitation, unable to assess PASP. Trivial pericardial effusion.  ASSESSMENT AND PLAN:  1. Chronic systolic HF   - due to presumed NICM - probable HTN. Has not had cath due to CKD - NYHA I-II History of nonischemic cardiomyopathy - Last echo 11/16 EF 25%. Repeat echo pending - QRS narrow on recent ECG - Volume status looks good. Continue current diuretic regimen.  - Await echo. If EF <= 35% he is willing to consider ICD - BP high - On goal dose carvedilol. - Not on ACE/ARB due to CKD 3. Baseline creatinine 3.21-3.3 (GFR 27-33). I will discuss with Dr. Posey Pronto if he would be candidate for cautious trial of Entresto for his HF and to slow progression of hypertensive nephropathy - Will re-challenge with Bidl 1/2 tablet TID and see if he tolerates. If tolerates for 5 days increase to 1 tab tid - Will likely also need doxasozin for BP control   2. Essential HTN, uncontrolled - As above. Add Bidil.  - Can use doxazosin as needed  3. CKD stage 3-4 - Not on ACE/ARB due to CKD 3. Baseline creatinine 3.21-3.3 (GFR 27-33). I will discuss with Dr. Posey Pronto if he  would be candidate for cautious trial of Entresto for his HF and to slow progression of hypertensive nephropathy  Zachary Bickers, MD  9:48 PM

## 2018-05-26 NOTE — Patient Instructions (Signed)
START taking Bidil 1/2 Tablet Three Times Daily.  In 5 days increase to 1 Tablet Three times daily.  Sleep study has been ordered for you, they will call you to schedule once insurance approves.  Follow up in 4-6 weeks.

## 2018-05-29 DIAGNOSIS — L089 Local infection of the skin and subcutaneous tissue, unspecified: Secondary | ICD-10-CM | POA: Diagnosis not present

## 2018-05-29 DIAGNOSIS — L730 Acne keloid: Secondary | ICD-10-CM | POA: Diagnosis not present

## 2018-05-29 DIAGNOSIS — L731 Pseudofolliculitis barbae: Secondary | ICD-10-CM | POA: Diagnosis not present

## 2018-05-29 DIAGNOSIS — Z5181 Encounter for therapeutic drug level monitoring: Secondary | ICD-10-CM | POA: Diagnosis not present

## 2018-05-31 ENCOUNTER — Encounter (HOSPITAL_COMMUNITY): Payer: Self-pay

## 2018-06-01 ENCOUNTER — Telehealth (HOSPITAL_COMMUNITY): Payer: Self-pay | Admitting: Pharmacist

## 2018-06-01 ENCOUNTER — Telehealth (HOSPITAL_COMMUNITY): Payer: Self-pay | Admitting: *Deleted

## 2018-06-01 NOTE — Telephone Encounter (Signed)
Pt aware.

## 2018-06-01 NOTE — Telephone Encounter (Signed)
Bidil PA approved by [employer] commercial through 05/30/21.   Ruta Hinds. Velva Harman, PharmD, BCPS, CPP Clinical Pharmacist Phone: [phone] 06/01/2018 1:54 PM

## 2018-06-01 NOTE — Telephone Encounter (Signed)
PA sent to [facility]. Awaiting response.   Ruta Hinds. Velva Harman, PharmD, BCPS, CPP Clinical Pharmacist Phone: [phone] 06/01/2018 11:19 AM

## 2018-06-01 NOTE — Telephone Encounter (Signed)
Pt sent mychart msg stating Bidil was not covered by his insurance. Not sure if PA is required or if this is something that is already being worked on by [employer].   Msg routed to [facility] for advice.

## 2018-06-02 ENCOUNTER — Encounter: Payer: Self-pay | Admitting: Sports Medicine

## 2018-06-02 DIAGNOSIS — M10022 Idiopathic gout, left elbow: Secondary | ICD-10-CM

## 2018-06-02 MED ORDER — LESINURAD 200 MG PO TABS: 1.0000 | ORAL_TABLET | Freq: Every day | ORAL | 11 refills | Status: DC

## 2018-06-02 NOTE — Assessment & Plan Note (Signed)
Allopurinol 300 mg daily which is a high dose for his renal function seems to be working well, uric acid levels are borderline acceptable. Persistent flares, multiple, has had to have multiple courses of prednisone, adding Zurampic to the regimen as well.

## 2018-06-06 ENCOUNTER — Ambulatory Visit (HOSPITAL_BASED_OUTPATIENT_CLINIC_OR_DEPARTMENT_OTHER)
Admission: RE | Admit: 2018-06-06 | Discharge: 2018-06-06 | Disposition: A | Discharge status: Home / Self Care | Payer: BLUE CROSS/BLUE SHIELD | Source: Ambulatory Visit | Attending: Physician Assistant | Admitting: Physician Assistant

## 2018-06-06 DIAGNOSIS — Z8673 Personal history of transient ischemic attack (TIA), and cerebral infarction without residual deficits: Secondary | ICD-10-CM | POA: Insufficient documentation

## 2018-06-06 DIAGNOSIS — I110 Hypertensive heart disease with heart failure: Secondary | ICD-10-CM | POA: Diagnosis not present

## 2018-06-06 DIAGNOSIS — I313 Pericardial effusion (noninflammatory): Secondary | ICD-10-CM | POA: Insufficient documentation

## 2018-06-06 DIAGNOSIS — I5022 Chronic systolic (congestive) heart failure: Secondary | ICD-10-CM | POA: Insufficient documentation

## 2018-06-06 DIAGNOSIS — I429 Cardiomyopathy, unspecified: Secondary | ICD-10-CM | POA: Diagnosis not present

## 2018-06-06 DIAGNOSIS — E785 Hyperlipidemia, unspecified: Secondary | ICD-10-CM | POA: Diagnosis not present

## 2018-06-06 DIAGNOSIS — E119 Type 2 diabetes mellitus without complications: Secondary | ICD-10-CM | POA: Insufficient documentation

## 2018-06-06 DIAGNOSIS — I493 Ventricular premature depolarization: Secondary | ICD-10-CM | POA: Diagnosis not present

## 2018-06-06 NOTE — Progress Notes (Signed)
  Echocardiogram 2D Echocardiogram has been performed.  Ardean Simonich T Havyn Ramo 06/06/2018, 9:54 AM

## 2018-06-07 MED ORDER — PREDNISONE 10 MG (48) PO TBPK: ORAL_TABLET | Freq: Every day | ORAL | 0 refills | Status: DC

## 2018-06-07 NOTE — Addendum Note (Signed)
Addended by: Silverio Decamp on: 06/07/2018 12:02 PM   Modules accepted: Orders

## 2018-07-07 ENCOUNTER — Encounter (HOSPITAL_COMMUNITY): Payer: BLUE CROSS/BLUE SHIELD | Admitting: Internal Medicine

## 2018-08-04 ENCOUNTER — Other Ambulatory Visit: Payer: Self-pay | Admitting: Sports Medicine

## 2018-08-04 ENCOUNTER — Other Ambulatory Visit: Payer: Self-pay | Admitting: Physician Assistant

## 2018-08-04 DIAGNOSIS — M10322 Gout due to renal impairment, left elbow: Secondary | ICD-10-CM

## 2018-08-04 DIAGNOSIS — I10 Essential (primary) hypertension: Secondary | ICD-10-CM

## 2018-08-07 ENCOUNTER — Ambulatory Visit (HOSPITAL_BASED_OUTPATIENT_CLINIC_OR_DEPARTMENT_OTHER): Payer: BLUE CROSS/BLUE SHIELD | Attending: Internal Medicine | Admitting: Cardiology

## 2018-08-07 VITALS — Ht 73.0 in | Wt 305.0 lb

## 2018-08-07 DIAGNOSIS — I5022 Chronic systolic (congestive) heart failure: Secondary | ICD-10-CM

## 2018-08-07 DIAGNOSIS — G4733 Obstructive sleep apnea (adult) (pediatric): Secondary | ICD-10-CM | POA: Diagnosis not present

## 2018-08-07 DIAGNOSIS — I110 Hypertensive heart disease with heart failure: Secondary | ICD-10-CM | POA: Diagnosis not present

## 2018-08-07 DIAGNOSIS — Z79899 Other long term (current) drug therapy: Secondary | ICD-10-CM | POA: Diagnosis not present

## 2018-08-07 DIAGNOSIS — Z6841 Body Mass Index (BMI) 40.0 and over, adult: Secondary | ICD-10-CM | POA: Insufficient documentation

## 2018-08-07 DIAGNOSIS — E669 Obesity, unspecified: Secondary | ICD-10-CM | POA: Diagnosis not present

## 2018-08-08 NOTE — Procedures (Signed)
   Patient Name: Zachary Lynch, [place] Date:09/06/2017 08/07/2018 Gender: Male D.O.B: [birth year] Age (years): 32 Referring Provider: Shaune Pascal [name] Height (inches): 58 Interpreting Physician: Fransico Him MD, ABSM Weight (lbs): 305 RPSGT: Laren Everts BMI: 40 MRN: [id number] Neck Size: 18.50  CLINICAL INFORMATION  Sleep Study Type: NPSG  Indication for sleep study: Hypertension, Obesity  Epworth Sleepiness Score: 5  SLEEP STUDY TECHNIQUE  As per the AASM Manual for the Scoring of Sleep and Associated Events v2.3 (April 2016) with a hypopnea requiring 4% desaturations. The channels recorded and monitored were frontal, central and occipital EEG, electrooculogram (EOG), submentalis EMG (chin), nasal and oral airflow, thoracic and abdominal wall motion, anterior tibialis EMG, snore microphone, electrocardiogram, and pulse oximetry.  MEDICATIONS  Medications self-administered by patient taken the night of the study : ATORVASTATIN, RANITIDINE  SLEEP ARCHITECTURE  The study was initiated at 10:04:30 PM and ended at 5:21:34 AM. Sleep onset time was 11.2 minutes and the sleep efficiency was 95.4%%. The total sleep time was 417 minutes. Stage REM latency was 110.5 minutes. The patient spent 7.2%% of the night in stage N1 sleep, 64.7%% in stage N2 sleep, 0.0%% in stage N3 and 28.1% in REM. Alpha intrusion was absent. Supine sleep was 26.90%.  RESPIRATORY PARAMETERS  The overall apnea/hypopnea index (AHI) was 10.4 per hour. There were 25 total apneas, including 25 obstructive, 0 central and 0 mixed apneas. There were 47 hypopneas and 24 RERAs. The AHI during Stage REM sleep was 21.5 per hour. AHI while supine was 26.2 per hour. The mean oxygen saturation was 95.8%. The minimum SpO2 during sleep was 89.0%. moderate snoring was noted during this study.  CARDIAC DATA  The 2 lead EKG demonstrated sinus rhythm. The mean heart rate was 84.4 beats per minute. Other EKG findings include:  PVCs.  LEG MOVEMENT DATA  The total PLMS were 0 with a resulting PLMS index of 0.0. Associated arousal with leg movement index was 0.0 .  IMPRESSIONS  - Mild obstructive sleep apnea occurred during this study (AHI = 10.4/h). - No significant central sleep apnea occurred during this study (CAI = 0.0/h). - The patient had minimal or no oxygen desaturation during the study (Min O2 = 89.0%) - The patient snored with moderate snoring volume. - EKG findings include PVCs. - Clinically significant periodic limb movements did not occur during sleep. No significant associated arousals.  DIAGNOSIS  - Obstructive Sleep Apnea (327.23 [G47.33 ICD-10])  RECOMMENDATIONS  - Therapeutic CPAP titration to determine optimal pressure required to alleviate sleep disordered breathing. - Positional therapy avoiding supine position during sleep. - Avoid alcohol, sedatives and other CNS depressants that may worsen sleep apnea and disrupt normal sleep architecture. - Sleep hygiene should be reviewed to assess factors that may improve sleep quality. - Weight management and regular exercise should be initiated or continued if appropriate.  [Electronically signed] 08/08/2018 09:51 PM  Fransico Him MD, ABSM Diplomate, American Board of Sleep Medicine

## 2018-08-11 ENCOUNTER — Telehealth: Payer: Self-pay | Admitting: *Deleted

## 2018-08-11 NOTE — Telephone Encounter (Signed)
-----   Message from Sueanne Margarita, MD sent at 08/08/2018  9:54 PM EST ----- Please let patient know that they have sleep apnea and recommend CPAP titration. Please set up titration in the sleep lab.

## 2018-08-11 NOTE — Telephone Encounter (Signed)
Return call: Informed patient of sleep study results and patient understanding was verbalized. Patient understands his sleep study showed  they have sleep apnea and recommend CPAP titration. Please set up titration in the sleep lab.   cpap titration sent to precert

## 2018-08-11 NOTE — Telephone Encounter (Signed)
Called results lmtcb on cell. 

## 2018-08-14 ENCOUNTER — Telehealth: Payer: Self-pay | Admitting: *Deleted

## 2018-08-14 NOTE — Telephone Encounter (Signed)
Staff message sent to [place] received. Ok  To schedule titration study. Authorization # [id number]. Valid 08/14/18 to 10/12/18.

## 2018-08-14 NOTE — Telephone Encounter (Signed)
-----   Message from Freada Bergeron, [place] sent at 08/11/2018  3:24 PM EST ----- Regarding: precert cpap titration

## 2018-08-15 ENCOUNTER — Telehealth: Payer: Self-pay | Admitting: *Deleted

## 2018-08-15 DIAGNOSIS — I10 Essential (primary) hypertension: Secondary | ICD-10-CM

## 2018-08-15 DIAGNOSIS — Z8673 Personal history of transient ischemic attack (TIA), and cerebral infarction without residual deficits: Secondary | ICD-10-CM

## 2018-08-15 NOTE — Telephone Encounter (Signed)
-----   Message from Lauralee Evener, [place] sent at 08/14/2018 10:53 AM EST ----- Regarding: RE: precert [employer] auth received. ok to schedule titration study. Auth # [id number]. Valid 08/14/18 to 10/12/18.   ----- Message ----- From: Freada Bergeron, CMA Sent: 08/11/2018   3:24 PM EST To: Cv Div Sleep Studies Subject: precert                                        cpap titration

## 2018-08-16 ENCOUNTER — Encounter: Payer: Self-pay | Admitting: *Deleted

## 2018-08-16 NOTE — Telephone Encounter (Signed)
Patient is scheduled for CPAP Titration on 10/10/2018. Patient understands his titration study will be done at [facility] [place] sleep lab. Patient understands he will receive a letter in a week or so detailing appointment, date, time, and location. Patient understands to call if he does not receive the letter  in a timely manner. Patient agrees with treatment and thanked me for call.

## 2018-08-20 ENCOUNTER — Other Ambulatory Visit: Payer: Self-pay | Admitting: Physician Assistant

## 2018-08-20 DIAGNOSIS — E785 Hyperlipidemia, unspecified: Secondary | ICD-10-CM

## 2018-09-26 ENCOUNTER — Other Ambulatory Visit: Payer: Self-pay | Admitting: Physician Assistant

## 2018-09-26 DIAGNOSIS — I10 Essential (primary) hypertension: Secondary | ICD-10-CM

## 2018-09-27 ENCOUNTER — Other Ambulatory Visit: Payer: Self-pay | Admitting: Sports Medicine

## 2018-09-27 ENCOUNTER — Inpatient Hospital Stay (HOSPITAL_COMMUNITY)
Admission: RE | Admit: 2018-09-27 | Payer: BLUE CROSS/BLUE SHIELD | Source: Ambulatory Visit | Admitting: Internal Medicine

## 2018-09-27 DIAGNOSIS — M10322 Gout due to renal impairment, left elbow: Secondary | ICD-10-CM

## 2018-09-29 ENCOUNTER — Encounter: Payer: Self-pay | Admitting: Osteopathic Medicine

## 2018-09-29 ENCOUNTER — Ambulatory Visit (INDEPENDENT_AMBULATORY_CARE_PROVIDER_SITE_OTHER): Payer: BLUE CROSS/BLUE SHIELD | Admitting: Osteopathic Medicine

## 2018-09-29 ENCOUNTER — Ambulatory Visit: Payer: BLUE CROSS/BLUE SHIELD | Admitting: Physician Assistant

## 2018-09-29 VITALS — BP 130/86 | HR 87 | Temp 98.7°F | Wt 309.1 lb

## 2018-09-29 DIAGNOSIS — N184 Chronic kidney disease, stage 4 (severe): Secondary | ICD-10-CM

## 2018-09-29 DIAGNOSIS — J101 Influenza due to other identified influenza virus with other respiratory manifestations: Secondary | ICD-10-CM

## 2018-09-29 DIAGNOSIS — R6889 Other general symptoms and signs: Secondary | ICD-10-CM | POA: Diagnosis not present

## 2018-09-29 LAB — POCT INFLUENZA A/B
Influenza A, POC: POSITIVE — AB
Influenza B, POC: NEGATIVE

## 2018-09-29 LAB — BASIC METABOLIC PANEL WITH GFR
BUN/Creatinine Ratio: 8 (calc) (ref 6–22)
BUN: 34 mg/dL — AB (ref 7–25)
CO2: 22 mmol/L (ref 20–32)
CREATININE: 4.1 mg/dL — AB (ref 0.60–1.35)
Calcium: 9.2 mg/dL (ref 8.6–10.3)
Chloride: 106 mmol/L (ref 98–110)
GFR, EST NON AFRICAN AMERICAN: 18 mL/min/{1.73_m2} — AB (ref >=60)
GFR, Est African American: 21 mL/min/{1.73_m2} — ABNORMAL LOW (ref >=60)
Glucose, Bld: 84 mg/dL (ref 65–99)
Potassium: 4.2 mmol/L (ref 3.5–5.3)
Sodium: 140 mmol/L (ref 135–146)

## 2018-09-29 MED ORDER — OSELTAMIVIR PHOSPHATE 75 MG PO CAPS: 75.0000 mg | ORAL_CAPSULE | Freq: Two times a day (BID) | ORAL | 0 refills | Status: DC

## 2018-09-29 MED ORDER — BENZONATATE 200 MG PO CAPS: 200.0000 mg | ORAL_CAPSULE | Freq: Three times a day (TID) | ORAL | 0 refills | Status: DC | PRN

## 2018-09-29 NOTE — Progress Notes (Addendum)
HPI: Zachary Lynch is a 33 y.o. male who  has a past medical history of Arthritis, CKD stage 4 secondary to hypertension ([place]) (03/28/2017), Elevated blood uric acid level, Essential hypertension, GERD (gastroesophageal reflux disease), Gout, History of cardiomyopathy, History of stroke (2010), Hyperlipidemia, Nonischemic cardiomyopathy ([place]) (03/28/2017), Obesity, and Prediabetes (05/03/2017).  he presents to [facility] today, 09/29/18,  for chief complaint of: Illness - flu like symptoms  . Location: generalized . Quality: cough, fever, chills, aches  . Duration: 5 days  . Context: multiple comorbidities, PMH reviewed in detail. No vaccines on file.    There is no immunization history on file for this patient.  No results found for this or any previous visit (from the past 24 hour(s)).      Past medical, surgical, social and family history reviewed and updated as necessary.   Current medication list and allergy/intolerance information reviewed:    Current Outpatient Medications  Medication Sig Dispense Refill  . allopurinol (ZYLOPRIM) 300 MG tablet TAKE 1 TABLET(300 MG) BY MOUTH DAILY 30 tablet 0  . amLODipine (NORVASC) 10 MG tablet Take 1 tablet (10 mg total) by mouth daily. Due for follow up visit w/PCP 30 tablet 0  . aspirin EC 81 MG tablet Take 1 tablet (81 mg total) by mouth daily. 90 tablet 3  . atorvastatin (LIPITOR) 20 MG tablet TAKE 1 TABLET(20 MG) BY MOUTH AT BEDTIME 90 tablet 0  . carvedilol (COREG) 25 MG tablet Take 50 mg by mouth 2 (two) times daily with a meal.    . clindamycin (CLINDAGEL) 1 % gel Apply topically.    . isosorbide-hydrALAZINE (BIDIL) 20-37.5 MG tablet START taking 1/2 Tablet Three Times Daily.  In 5 Days increase to 1 Tablet Three Times Daily. 90 tablet 3  . predniSONE (STERAPRED UNI-PAK 48 TAB) 10 MG (48) TBPK tablet Take by mouth daily. Use as directed for taper 1 tablet 0  . ranitidine (ZANTAC) 300 MG capsule   2  .  sucralfate (CARAFATE) 1 g tablet TAKE 1 TABLET(1 GRAM) BY MOUTH TWICE DAILY 120 tablet 0  . tretinoin (RETIN-A) 0.025 % cream Apply pea-sized amount to face and neck nightly.    . carvedilol (COREG) 25 MG tablet Take 1 tablet (25 mg total) by mouth 2 (two) times daily with a meal. Due for follow up visit w/PCP (Patient not taking: Reported on 09/29/2018) 60 tablet 0   No current facility-administered medications for this visit.     Allergies  Allergen Reactions  . Albuterol Other (See Comments)    Reaction is unknown. Allergy was entered post Stroke "caused him to have a stroke"    . Tramadol Nausea Only    Made patient disoriented      Review of Systems:  Constitutional:  +fever, +chills, +recent illness, No unintentional weight changes. +significant fatigue.   HEENT: +headache, no vision change, no hearing change, +sore throat, +sinus pressure  Cardiac: No  chest pain, No  pressure, No palpitations  Respiratory:  No  shortness of breath. +Cough  Gastrointestinal: No  abdominal pain, No  nausea, No  vomiting,  No  blood in stool, No  diarrhea  Musculoskeletal: No new myalgia/arthralgia  Skin: No  Rash  Neurologic: No  weakness, No  dizziness  Exam:  BP 130/86 (BP Location: Left Arm, Patient Position: Sitting, Cuff Size: Large)   Pulse 87   Temp 98.7 F (37.1 C) (Oral)   Wt (!) 309 lb 1.6 oz (140.2 kg)  SpO2 98%   BMI 40.78 kg/m   Constitutional: VS see above. General Appearance: alert, well-developed, well-nourished, NAD  Eyes: Normal lids and conjunctive, non-icteric sclera  Ears, Nose, Mouth, Throat: MMM, Normal external inspection ears/nares/mouth/lips/gums.   Neck: No masses, trachea midline. No tenderness/mass appreciated. No lymphadenopathy  Respiratory: Normal respiratory effort. no wheeze, no rhonchi, no rales  Cardiovascular: S1/S2 normal, no murmur, no rub/gallop auscultated. RRR. No lower extremity edema.   Musculoskeletal: Gait normal.    Neurological: Normal balance/coordination. No tremor.   Skin: warm, dry, intact.   Psychiatric: Normal judgment/insight. Normal mood and affect.   Results for orders placed or performed in visit on 09/29/18 (from the past 72 hour(s))  POCT Influenza A/B     Status: Abnormal   Collection Time: 09/29/18  9:07 AM  Result Value Ref Range   Influenza A, POC Positive (A) Negative   Influenza B, POC Negative Negative        ASSESSMENT/PLAN: The primary encounter diagnosis was Influenza A virus present. A diagnosis of Flu-like symptoms was also pertinent to this visit.   CrCl calculated at 66 - will confirm renal fxn w/ with BMP today since if Creatinine is much higher it could put him below the 60 cutoff for renally adjusted dose tamiflu   Normally wouldn't bother w/ tamiflu this far out from symptom onset but guidelines warrant treatment in someone with his comorbids   No orders of the defined types were placed in this encounter.   Orders Placed This Encounter  Procedures  . POCT Influenza A/B      ADDENDUM: CrCl calculated at 50, lower dose Tamiflu to pharmacy 30 mg bid x5 days, advised skip dose in meantime, see lab result note    Patient Instructions  Influenza, Adult Influenza, more commonly known as "the flu," is a viral infection that mainly affects the respiratory tract. The respiratory tract includes organs that help you breathe, such as the lungs, nose, and throat. The flu causes many symptoms similar to the common cold along with high fever and body aches. The flu spreads easily from person to person (is contagious). Getting a flu shot (influenza vaccination) every year is the best way to prevent the flu. What are the causes? This condition is caused by the influenza virus. You can get the virus by:  Breathing in droplets that are in the air from an infected person's cough or sneeze.  Touching something that has been exposed to the virus (has been contaminated)  and then touching your mouth, nose, or eyes. What increases the risk? The following factors may make you more likely to get the flu:  Not washing or sanitizing your hands often.  Having close contact with many people during cold and flu season.  Touching your mouth, eyes, or nose without first washing or sanitizing your hands.  Not getting a yearly (annual) flu shot. You may have a higher risk for the flu, including serious problems such as a lung infection (pneumonia), if you:  Are older than 65.  Are pregnant.  Have a weakened disease-fighting system (immune system). You may have a weakened immune system if you: ? Have HIV or AIDS. ? Are undergoing chemotherapy. ? Are taking medicines that reduce (suppress) the activity of your immune system.  Have a long-term (chronic) illness, such as heart disease, kidney disease, diabetes, or lung disease.  Have a liver disorder.  Are severely overweight (morbidly obese).  Have anemia. This is a condition that affects your red blood  cells.  Have asthma. What are the signs or symptoms? Symptoms of this condition usually begin suddenly and last 4-14 days. They may include:  Fever and chills.  Headaches, body aches, or muscle aches.  Sore throat.  Cough.  Runny or stuffy (congested) nose.  Chest discomfort.  Poor appetite.  Weakness or fatigue.  Dizziness.  Nausea or vomiting. How is this diagnosed? This condition may be diagnosed based on:  Your symptoms and medical history.  A physical exam.  Swabbing your nose or throat and testing the fluid for the influenza virus. How is this treated? If the flu is diagnosed early, you can be treated with medicine that can help reduce how severe the illness is and how long it lasts (antiviral medicine). This may be given by mouth (orally) or through an IV. Taking care of yourself at home can help relieve symptoms. Your health care provider may recommend:  Taking  over-the-counter medicines.  Drinking plenty of fluids. In many cases, the flu goes away on its own. If you have severe symptoms or complications, you may be treated in a hospital. Follow these instructions at home: Activity  Rest as needed and get plenty of sleep.  Stay home from work or school as told by your health care provider. Unless you are visiting your health care provider, avoid leaving home until your fever has been gone for 24 hours without taking medicine. Eating and drinking  Take an oral rehydration solution (ORS). This is a drink that is sold at pharmacies and retail stores.  Drink enough fluid to keep your urine pale yellow.  Drink clear fluids in small amounts as you are able. Clear fluids include water, ice chips, diluted fruit juice, and low-calorie sports drinks.  Eat bland, easy-to-digest foods in small amounts as you are able. These foods include bananas, applesauce, rice, lean meats, toast, and crackers.  Avoid drinking fluids that contain a lot of sugar or caffeine, such as energy drinks, regular sports drinks, and soda.  Avoid alcohol.  Avoid spicy or fatty foods. General instructions      Take over-the-counter and prescription medicines only as told by your health care provider.  Use a cool mist humidifier to add humidity to the air in your home. This can make it easier to breathe.  Cover your mouth and nose when you cough or sneeze.  Wash your hands with soap and water often, especially after you cough or sneeze. If soap and water are not available, use alcohol-based hand sanitizer.  Keep all follow-up visits as told by your health care provider. This is important. How is this prevented?   Get an annual flu shot. You may get the flu shot in late summer, fall, or winter. Ask your health care provider when you should get your flu shot.  Avoid contact with people who are sick during cold and flu season. This is generally fall and winter. Contact a  health care provider if:  You develop new symptoms.  You have: ? Chest pain. ? Diarrhea. ? A fever.  Your cough gets worse.  You produce more mucus.  You feel nauseous or you vomit. Get help right away if:  You develop shortness of breath or difficulty breathing.  Your skin or nails turn a bluish color.  You have severe pain or stiffness in your neck.  You develop a sudden headache or sudden pain in your face or ear.  You cannot eat or drink without vomiting. Summary  Influenza, more commonly known  as "the flu," is a viral infection that primarily affects your respiratory tract.  Symptoms of the flu usually begin suddenly and last 4-14 days.  Getting an annual flu shot is the best way to prevent getting the flu.  Stay home from work or school as told by your health care provider. Unless you are visiting your health care provider, avoid leaving home until your fever has been gone for 24 hours without taking medicine.  Keep all follow-up visits as told by your health care provider. This is important. This information is not intended to replace advice given to you by your health care provider. Make sure you discuss any questions you have with your health care provider. Document Released: 09/03/2000 Document Revised: 02/22/2018 Document Reviewed: 02/22/2018 Elsevier Interactive Patient Education  2019 [employer].       Visit summary with medication list and pertinent instructions was printed for patient to review. All questions at time of visit were answered - patient instructed to contact office with any additional concerns or updates. ER/RTC precautions were reviewed with the patient.   Follow-up plan: Return if symptoms worsen or fail to improve.    Please note: voice recognition software was used to produce this document, and typos may escape review. Please contact Dr. Sheppard Coil for any needed clarifications.

## 2018-09-29 NOTE — Patient Instructions (Signed)

## 2018-09-30 MED ORDER — OSELTAMIVIR PHOSPHATE 30 MG PO CAPS: 30.0000 mg | ORAL_CAPSULE | Freq: Two times a day (BID) | ORAL | 0 refills | Status: DC

## 2018-09-30 NOTE — Addendum Note (Signed)
Addended by: Maryla Morrow on: 09/30/2018 08:16 AM   Modules accepted: Orders

## 2018-10-10 ENCOUNTER — Encounter (HOSPITAL_BASED_OUTPATIENT_CLINIC_OR_DEPARTMENT_OTHER): Payer: BLUE CROSS/BLUE SHIELD

## 2018-10-28 ENCOUNTER — Other Ambulatory Visit: Payer: Self-pay | Admitting: Sports Medicine

## 2018-10-28 DIAGNOSIS — M10322 Gout due to renal impairment, left elbow: Secondary | ICD-10-CM

## 2018-11-15 LAB — SODIUM: SODIUM: 141

## 2018-11-15 LAB — PHOSPHORUS: PHOSPHORUS: 3.3

## 2018-11-15 LAB — VITAMIN D, 1,25 + 25-HYDROXY: VIT D 1 25 DIHYDROXY: 22.2

## 2018-11-15 LAB — BUN+CREAT
BUN: 30
Creat: 4.39

## 2018-11-15 LAB — POTASSIUM: POTASSIUM: 4

## 2018-11-15 LAB — ALBUMIN: Albumin: 4.1

## 2018-11-17 LAB — PTH, INTACT: PTH: 189

## 2018-11-18 ENCOUNTER — Other Ambulatory Visit: Payer: Self-pay | Admitting: Physician Assistant

## 2018-11-18 DIAGNOSIS — E785 Hyperlipidemia, unspecified: Secondary | ICD-10-CM

## 2018-12-06 ENCOUNTER — Other Ambulatory Visit: Payer: Self-pay | Admitting: Physician Assistant

## 2018-12-06 ENCOUNTER — Encounter: Payer: Self-pay | Admitting: Physician Assistant

## 2018-12-06 MED ORDER — CARVEDILOL 25 MG PO TABS: 25.0000 mg | ORAL_TABLET | Freq: Two times a day (BID) | ORAL | 0 refills | Status: DC

## 2019-01-21 IMAGING — NM NM PULMONARY VENT & PERF
16 series · 16 of 16 positions shown · non-contrast
Comparison: Chest radiograph October 26, 2016

CLINICAL DATA: Shortness of breath.  Cough and congestion.  Fever.

EXAM:
NUCLEAR MEDICINE VENTILATION - PERFUSION LUNG SCAN
VIEWS:
Anterior, posterior, left lateral, right lateral, RPO, LPO, RAO, LAO
-ventilation and perfusion
RADIOPHARMACEUTICALS:  32.99 mCi Bechnetium-RRm DTPA aerosol
inhalation and 4.1 mCi Bechnetium-RRm MAA IV

[Series 1: ant/post vent · 4.14mm/px · 1 of 1 slices shown (1 of 2)]
[im 1/1]
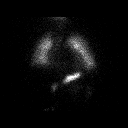

[Series 1: ant/post vent · 4.14mm/px · 1 of 1 slices shown (2 of 2)]
[im 1/1  full-range]
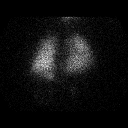

[Series 2: lao/rpo vent · 4.14mm/px · 1 of 1 slices shown (1 of 2)]
[im 1/1  full-range]
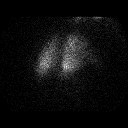

[Series 2: lao/rpo vent · 4.14mm/px · 1 of 1 slices shown (2 of 2)]
[im 1/1]
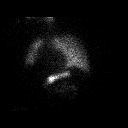

[Series 3: lpo/rao vent · 4.14mm/px · 1 of 1 slices shown (1 of 2)]
[im 1/1  full-range]
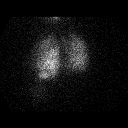

[Series 3: lpo/rao vent · 4.14mm/px · 1 of 1 slices shown (2 of 2)]
[im 1/1]
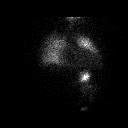

[Series 4: lt lat/rt lat vent · 4.14mm/px · 1 of 1 slices shown (1 of 2)]
[im 1/1  full-range]
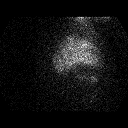

[Series 4: lt lat/rt lat vent · 4.14mm/px · 1 of 1 slices shown (2 of 2)]
[im 1/1  full-range]
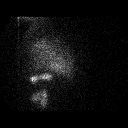

[Series 5: lt lat/rt lat perf · 4.14mm/px · 1 of 1 slices shown (1 of 2)]
[im 1/1]
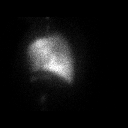

[Series 5: lt lat/rt lat perf · 4.14mm/px · 1 of 1 slices shown (2 of 2)]
[im 1/1]
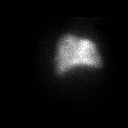

[Series 6: lpo/rao perf · 4.14mm/px · 1 of 1 slices shown (1 of 2)]
[im 1/1]
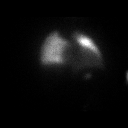

[Series 6: lpo/rao perf · 4.14mm/px · 1 of 1 slices shown (2 of 2)]
[im 1/1]
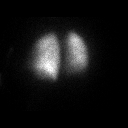

[Series 7: ant/post perf · 4.14mm/px · 1 of 1 slices shown (1 of 2)]
[im 1/1]
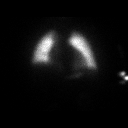

[Series 7: ant/post perf · 4.14mm/px · 1 of 1 slices shown (2 of 2)]
[im 1/1]
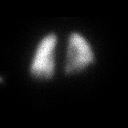

[Series 8: lao/rpo perf · 4.14mm/px · 1 of 1 slices shown (1 of 2)]
[im 1/1]
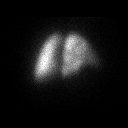

[Series 8: lao/rpo perf · 4.14mm/px · 1 of 1 slices shown (2 of 2)]
[im 1/1]
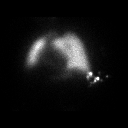

[16 of 16 positions shown; findings below may reference images not displayed]

FINDINGS: Ventilation: Radiotracer uptake is homogeneous and symmetric
bilaterally. No ventilation defects are evident.

Perfusion: Radiotracer uptake is homogeneous and symmetric
bilaterally. No perfusion defects evident. Heart noted to be
enlarged.
IMPRESSION: Normal ventilation and perfusion lung scans.  Cardiomegaly noted.

## 2019-01-22 ENCOUNTER — Encounter: Payer: Self-pay | Admitting: Physician Assistant

## 2019-01-23 ENCOUNTER — Encounter: Payer: Self-pay | Admitting: Physician Assistant

## 2019-01-23 ENCOUNTER — Other Ambulatory Visit: Payer: Self-pay

## 2019-01-23 ENCOUNTER — Telehealth: Payer: BLUE CROSS/BLUE SHIELD | Admitting: Physician Assistant

## 2019-01-23 ENCOUNTER — Telehealth: Payer: Self-pay | Admitting: Sports Medicine

## 2019-01-23 DIAGNOSIS — E785 Hyperlipidemia, unspecified: Secondary | ICD-10-CM

## 2019-01-23 DIAGNOSIS — M10322 Gout due to renal impairment, left elbow: Secondary | ICD-10-CM

## 2019-01-23 DIAGNOSIS — R1013 Epigastric pain: Secondary | ICD-10-CM | POA: Insufficient documentation

## 2019-01-23 DIAGNOSIS — I5022 Chronic systolic (congestive) heart failure: Secondary | ICD-10-CM

## 2019-01-23 DIAGNOSIS — N184 Chronic kidney disease, stage 4 (severe): Secondary | ICD-10-CM

## 2019-01-23 DIAGNOSIS — I10 Essential (primary) hypertension: Secondary | ICD-10-CM

## 2019-01-23 DIAGNOSIS — I129 Hypertensive chronic kidney disease with stage 1 through stage 4 chronic kidney disease, or unspecified chronic kidney disease: Secondary | ICD-10-CM | POA: Insufficient documentation

## 2019-01-23 MED ORDER — CARVEDILOL 25 MG PO TABS: 50.0000 mg | ORAL_TABLET | Freq: Two times a day (BID) | ORAL | 0 refills | Status: DC

## 2019-01-23 MED ORDER — OLMESARTAN MEDOXOMIL 40 MG PO TABS: 40.0000 mg | ORAL_TABLET | Freq: Every day | ORAL | 0 refills | Status: DC

## 2019-01-23 MED ORDER — FAMOTIDINE 40 MG PO TABS: 40.0000 mg | ORAL_TABLET | Freq: Every day | ORAL | 0 refills | Status: DC | PRN

## 2019-01-23 MED ORDER — ALLOPURINOL 300 MG PO TABS: 300.0000 mg | ORAL_TABLET | Freq: Every day | ORAL | 0 refills | Status: DC

## 2019-01-23 MED ORDER — ATORVASTATIN CALCIUM 20 MG PO TABS: 20.0000 mg | ORAL_TABLET | Freq: Every day | ORAL | 0 refills | Status: DC

## 2019-01-23 MED ORDER — AMLODIPINE BESYLATE 10 MG PO TABS: 10.0000 mg | ORAL_TABLET | Freq: Every day | ORAL | 0 refills | Status: DC

## 2019-01-23 NOTE — Telephone Encounter (Signed)
Patient requesting refills, has not been seen in over 6 months, please set him up with either a virtual or an in person visit.

## 2019-01-23 NOTE — Telephone Encounter (Signed)
Scheduled today 5/5 - CF

## 2019-01-23 NOTE — Progress Notes (Unsigned)
Virtual Visit via Video Note  I connected with Zachary Lynch on 01/23/19 at  2:40 PM EDT by a video enabled telemedicine application and verified that I am speaking with the correct person using two identifiers.   I discussed the limitations of evaluation and management by telemedicine and the availability of in person appointments. The patient expressed understanding and agreed to proceed.  History of Present Illness: HPI:                                                                Zachary Lynch is a 33 y.o. male   CC: medication management  CHF: reports improved exercise tolerance with walking short distances from his car to his office. He states his energy level is improved. He states he does not like how the Bidil makes him feel (causes heart racing), so he is only taking this as needed in the evening for elevated home BP readings. He is doing this rarely, one-two times per month.   HTN: taking Amlodipine, Olmesartan and Lasix daily. Lasix was recently added in February by Nephrologist. Compliant with medications. Checks BP's at home. 138/92 at home last night. Denies vision change, headache, chest pain with exertion, orthopnea, lightheadedness, syncope and edema. Risk factors include:  GERD: takes Rantidine 300 mg prn for indigestion. Tries to limit pasta sauce and acidic foods. Denies dysphagia, odynophagia, vomiting, epigastric pain, melena/hematochezia.  Depression screen [facility] 2/9 01/23/2019 03/28/2017  Decreased Interest 0 0  Down, Depressed, Hopeless 0 0  PHQ - 2 Score 0 0     Past Medical History:  Diagnosis Date  . Arthritis   . CKD stage 4 secondary to hypertension ([place]) 03/28/2017  . Elevated blood uric acid level   . Essential hypertension   . GERD (gastroesophageal reflux disease)   . Gout   . History of cardiomyopathy    LVEF 15% in 2010 - evaluated by [facility] and Dr. Caryl Comes  . History of stroke 2010   Right MCA distribution infarct, felt to be possibly embolic in the  setting of cardiomyopathy - placed on Coumadin at that time  . Hyperlipidemia   . Nonischemic cardiomyopathy ([place]) 03/28/2017  . Obesity   . Prediabetes 05/03/2017   History reviewed. No pertinent surgical history. Social History   Tobacco Use  . Smoking status: Never Smoker  . Smokeless tobacco: Never Used  Substance Use Topics  . Alcohol use: No    Alcohol/week: 0.0 standard drinks   family history includes Heart attack in his mother; Hypertension in his father and mother; Stroke in his cousin.    ROS: negative except as noted in the HPI  Medications: Current Outpatient Medications  Medication Sig Dispense Refill  . allopurinol (ZYLOPRIM) 300 MG tablet Take 1 tablet (300 mg total) by mouth daily. 90 tablet 0  . amLODipine (NORVASC) 10 MG tablet Take 1 tablet (10 mg total) by mouth daily. 90 tablet 0  . aspirin EC 81 MG tablet Take 1 tablet (81 mg total) by mouth daily. 90 tablet 3  . atorvastatin (LIPITOR) 20 MG tablet Take 1 tablet (20 mg total) by mouth daily. 90 tablet 0  . carvedilol (COREG) 25 MG tablet Take 2 tablets (50 mg total) by mouth 2 (two) times daily with a meal.  360 tablet 0  . furosemide (LASIX) 40 MG tablet TK 1 T PO D    . olmesartan (BENICAR) 40 MG tablet Take 1 tablet (40 mg total) by mouth daily. 90 tablet 0  . famotidine (PEPCID) 40 MG tablet Take 1 tablet (40 mg total) by mouth daily as needed for heartburn or indigestion. 90 tablet 0  . isosorbide-hydrALAZINE (BIDIL) 20-37.5 MG tablet START taking 1/2 Tablet Three Times Daily.  In 5 Days increase to 1 Tablet Three Times Daily. (Patient not taking: Reported on 01/23/2019) 90 tablet 3  . tretinoin (RETIN-A) 0.025 % cream Apply pea-sized amount to face and neck nightly.    . Vitamin D, Ergocalciferol, (DRISDOL) 1.25 MG (50000 UT) CAPS capsule TK 1 C PO 1 TIME A WK     No current facility-administered medications for this visit.    Allergies  Allergen Reactions  . Albuterol Other (See Comments)    Reaction  is unknown. Allergy was entered post Stroke "caused him to have a stroke"    . Tramadol Nausea Only    Made patient disoriented       Objective:  There were no vitals taken for this visit. Gen:  alert, not ill-appearing, no distress, appropriate for age 61: head normocephalic without obvious abnormality, conjunctiva and cornea clear, trachea midline Pulm: Normal work of breathing, normal phonation, clear to auscultation bilaterally, no wheezes, rales or rhonchi CV: Normal rate, regular rhythm, s1 and s2 distinct, no murmurs, clicks or rubs  Neuro: alert and oriented x 3, no tremor MSK: extremities atraumatic, normal gait and station Skin: intact, no rashes on exposed skin, no jaundice, no cyanosis Psych: well-groomed, cooperative, good eye contact, euthymic mood, affect mood-congruent, speech is articulate, and thought processes clear and goal-directed   BP Readings from Last 3 Encounters:  09/29/18 130/86  05/26/18 (!) 174/116  05/24/18 (!) 172/132     Lab Results  Component Value Date   CREATININE 4.10 (H) 09/29/2018   BUN 34 (H) 09/29/2018   NA 140 09/29/2018   K 4.2 09/29/2018   CL 106 09/29/2018   CO2 22 09/29/2018     No results found for this or any previous visit (from the past 72 hour(s)). No results found.    Assessment and Plan: 33 y.o. male with ***  Personally reviewed labs from Feb 2020 GFR 19  Patient education and anticipatory guidance given Patient agrees with treatment plan Follow-up as needed if symptoms worsen or fail to improve  Darlyne Russian PA-C    Observations/Objective:   Assessment and Plan:   Follow Up Instructions:    I discussed the assessment and treatment plan with the patient. The patient was provided an opportunity to ask questions and all were answered. The patient agreed with the plan and demonstrated an understanding of the instructions.   The patient was advised to call back or seek an in-person  evaluation if the symptoms worsen or if the condition fails to improve as anticipated.  I provided *** minutes of non-face-to-face time during this encounter.   [name], [place]

## 2019-02-05 ENCOUNTER — Other Ambulatory Visit: Payer: Self-pay | Admitting: Physician Assistant

## 2019-02-05 DIAGNOSIS — R1013 Epigastric pain: Secondary | ICD-10-CM

## 2019-05-08 ENCOUNTER — Other Ambulatory Visit: Payer: Self-pay | Admitting: Sports Medicine

## 2019-05-08 DIAGNOSIS — M10322 Gout due to renal impairment, left elbow: Secondary | ICD-10-CM

## 2019-06-14 ENCOUNTER — Other Ambulatory Visit: Payer: Self-pay | Admitting: Physician Assistant

## 2019-06-14 DIAGNOSIS — M10322 Gout due to renal impairment, left elbow: Secondary | ICD-10-CM

## 2019-06-14 DIAGNOSIS — I10 Essential (primary) hypertension: Secondary | ICD-10-CM

## 2019-07-19 ENCOUNTER — Other Ambulatory Visit: Payer: Self-pay | Admitting: Physician Assistant

## 2019-07-19 DIAGNOSIS — I10 Essential (primary) hypertension: Secondary | ICD-10-CM

## 2019-07-19 DIAGNOSIS — Z8673 Personal history of transient ischemic attack (TIA), and cerebral infarction without residual deficits: Secondary | ICD-10-CM

## 2019-07-19 DIAGNOSIS — R1013 Epigastric pain: Secondary | ICD-10-CM

## 2019-07-30 ENCOUNTER — Other Ambulatory Visit: Payer: Self-pay | Admitting: Physician Assistant

## 2019-07-30 DIAGNOSIS — I5022 Chronic systolic (congestive) heart failure: Secondary | ICD-10-CM

## 2019-08-01 ENCOUNTER — Other Ambulatory Visit: Payer: Self-pay | Admitting: Physician Assistant

## 2019-08-01 DIAGNOSIS — E785 Hyperlipidemia, unspecified: Secondary | ICD-10-CM

## 2019-08-06 ENCOUNTER — Encounter: Payer: Self-pay | Admitting: Sports Medicine

## 2019-08-06 ENCOUNTER — Other Ambulatory Visit: Payer: Self-pay

## 2019-08-06 ENCOUNTER — Telehealth (INDEPENDENT_AMBULATORY_CARE_PROVIDER_SITE_OTHER): Payer: BLUE CROSS/BLUE SHIELD | Admitting: Sports Medicine

## 2019-08-06 DIAGNOSIS — I10 Essential (primary) hypertension: Secondary | ICD-10-CM

## 2019-08-06 DIAGNOSIS — M10022 Idiopathic gout, left elbow: Secondary | ICD-10-CM

## 2019-08-06 DIAGNOSIS — M10322 Gout due to renal impairment, left elbow: Secondary | ICD-10-CM | POA: Diagnosis not present

## 2019-08-06 DIAGNOSIS — I5022 Chronic systolic (congestive) heart failure: Secondary | ICD-10-CM | POA: Diagnosis not present

## 2019-08-06 MED ORDER — AMLODIPINE BESYLATE 10 MG PO TABS: 10.0000 mg | ORAL_TABLET | Freq: Every day | ORAL | 1 refills | Status: DC

## 2019-08-06 MED ORDER — OLMESARTAN MEDOXOMIL 40 MG PO TABS: 40.0000 mg | ORAL_TABLET | Freq: Every day | ORAL | 1 refills | Status: DC

## 2019-08-06 MED ORDER — ALLOPURINOL 300 MG PO TABS: 300.0000 mg | ORAL_TABLET | Freq: Every day | ORAL | 1 refills | Status: DC

## 2019-08-06 NOTE — Assessment & Plan Note (Signed)
Unfortunately no vitals available on the virtual visit but historically they were controlled, he does need a refill on olmesartan.

## 2019-08-06 NOTE — Assessment & Plan Note (Signed)
Uric acid levels were down to 5, rechecking levels. He has not had a gout flare in months on the current regimen.

## 2019-08-06 NOTE — Progress Notes (Signed)
Virtual Visit via WebEx/MyChart   I connected with  Zachary Lynch  on 08/06/19 via WebEx/MyChart/Doximity Video and verified that I am speaking with the correct person using two identifiers.   I discussed the limitations, risks, security and privacy concerns of performing an evaluation and management service by WebEx/MyChart/Doximity Video, including the higher likelihood of inaccurate diagnosis and treatment, and the availability of in person appointments.  We also discussed the likely need of an additional face to face encounter for complete and high quality delivery of care.  I also discussed with the patient that there may be a patient responsible charge related to this service. The patient expressed understanding and wishes to proceed.  Provider location is either at home or medical facility. Patient location is at their home, different from provider location. People involved in care of the patient during this telehealth encounter were myself, my nurse/medical assistant, and my front office/scheduling team member.  Subjective:    CC: Medication follow-up  HPI: This is a pleasant 33 year old male, he is doing well, has not had a gout flare in months, his PCP has left the practice and so he needs refills on some medications, he is agreeable to establish care with one of our new providers.  I reviewed the past medical history, family history, social history, surgical history, and allergies today and no changes were needed.  Please see the problem list section below in epic for further details.  Past Medical History: Past Medical History:  Diagnosis Date  . Arthritis   . CKD stage 4 secondary to hypertension ([place]) 03/28/2017  . Elevated blood uric acid level   . Essential hypertension   . GERD (gastroesophageal reflux disease)   . Gout   . History of cardiomyopathy    LVEF 15% in 2010 - evaluated by [facility] and Dr. Caryl Comes  . History of stroke 2010   Right MCA distribution infarct, felt  to be possibly embolic in the setting of cardiomyopathy - placed on Coumadin at that time  . Hyperlipidemia   . Nonischemic cardiomyopathy ([place]) 03/28/2017  . Obesity   . Prediabetes 05/03/2017   Past Surgical History: No past surgical history on file. Social History: Social History   Socioeconomic History  . Marital status: Single    Spouse name: Not on file  . Number of children: Not on file  . Years of education: Not on file  . Highest education level: Not on file  Occupational History  . Not on file  Social Needs  . Financial resource strain: Not on file  . Food insecurity    Worry: Not on file    Inability: Not on file  . Transportation needs    Medical: Not on file    Non-medical: Not on file  Tobacco Use  . Smoking status: Never Smoker  . Smokeless tobacco: Never Used  Substance and Sexual Activity  . Alcohol use: No    Alcohol/week: 0.0 standard drinks  . Drug use: No  . Sexual activity: Not Currently  Lifestyle  . Physical activity    Days per week: Not on file    Minutes per session: Not on file  . Stress: Not on file  Relationships  . Social Herbalist on phone: Not on file    Gets together: Not on file    Attends religious service: Not on file    Active member of club or organization: Not on file    Attends meetings of clubs or  organizations: Not on file    Relationship status: Not on file  Other Topics Concern  . Not on file  Social History Narrative  . Not on file   Family History: Family History  Problem Relation Age of Onset  . Stroke Cousin   . Hypertension Mother   . Heart attack Mother        Died age 12  . Hypertension Father    Allergies: Allergies  Allergen Reactions  . Albuterol Other (See Comments)    Reaction is unknown. Allergy was entered post Stroke "caused him to have a stroke"    . Tramadol Nausea Only    Made patient disoriented   Medications: See med rec.  Review of Systems: No fevers, chills, night  sweats, weight loss, chest pain, or shortness of breath.   Objective:    General: Speaking full sentences, no audible heavy breathing.  Sounds alert and appropriately interactive.  Appears well.  Face symmetric.  Extraocular movements intact.  Pupils equal and round.  No nasal flaring or accessory muscle use visualized.  No other physical exam performed due to the non-physical nature of this visit.  Impression and Recommendations:    Gout attack Uric acid levels were down to 5, rechecking levels. He has not had a gout flare in months on the current regimen.  Essential hypertension Unfortunately no vitals available on the virtual visit but historically they were controlled, he does need a refill on olmesartan.  I discussed the above assessment and treatment plan with the patient. The patient was provided an opportunity to ask questions and all were answered. The patient agreed with the plan and demonstrated an understanding of the instructions.   The patient was advised to call back or seek an in-person evaluation if the symptoms worsen or if the condition fails to improve as anticipated.   I provided 25 minutes of non-face-to-face time during this encounter, 15 minutes of additional time was needed to gather information, review chart, records, communicate/coordinate with staff remotely, troubleshooting the multiple errors that we get every time when trying to do video calls through the electronic medical record, WebEx, and Doximity, restart the encounter multiple times due to instability of the software, as well as complete documentation.   ___________________________________________ Gwen Her. Dianah Field, M.D., ABFM., CAQSM. Primary Care and Sports Medicine [facility]  Adjunct Professor of [place] [employer]

## 2019-10-16 ENCOUNTER — Other Ambulatory Visit: Payer: Self-pay | Admitting: Sports Medicine

## 2019-10-16 ENCOUNTER — Other Ambulatory Visit: Payer: Self-pay

## 2019-10-16 DIAGNOSIS — R1013 Epigastric pain: Secondary | ICD-10-CM

## 2019-10-16 MED ORDER — FAMOTIDINE 40 MG PO TABS: ORAL_TABLET | ORAL | 0 refills | Status: DC

## 2019-11-01 ENCOUNTER — Other Ambulatory Visit: Payer: Self-pay

## 2019-11-01 DIAGNOSIS — I5022 Chronic systolic (congestive) heart failure: Secondary | ICD-10-CM

## 2019-11-01 MED ORDER — CARVEDILOL 25 MG PO TABS: 50.0000 mg | ORAL_TABLET | Freq: Two times a day (BID) | ORAL | 0 refills | Status: DC

## 2019-11-20 ENCOUNTER — Other Ambulatory Visit: Payer: Self-pay

## 2019-11-20 DIAGNOSIS — R1013 Epigastric pain: Secondary | ICD-10-CM

## 2019-11-20 MED ORDER — FAMOTIDINE 40 MG PO TABS: ORAL_TABLET | ORAL | 0 refills | Status: DC

## 2020-03-03 ENCOUNTER — Other Ambulatory Visit: Payer: Self-pay

## 2020-03-03 ENCOUNTER — Encounter: Payer: Self-pay | Admitting: Medical-Surgical

## 2020-03-03 ENCOUNTER — Ambulatory Visit (INDEPENDENT_AMBULATORY_CARE_PROVIDER_SITE_OTHER): Payer: BLUE CROSS/BLUE SHIELD | Admitting: Medical-Surgical

## 2020-03-03 VITALS — BP 175/127 | HR 81 | Temp 98.4°F | Ht 71.0 in | Wt 296.3 lb

## 2020-03-03 DIAGNOSIS — Z114 Encounter for screening for human immunodeficiency virus [HIV]: Secondary | ICD-10-CM

## 2020-03-03 DIAGNOSIS — I5022 Chronic systolic (congestive) heart failure: Secondary | ICD-10-CM

## 2020-03-03 DIAGNOSIS — E785 Hyperlipidemia, unspecified: Secondary | ICD-10-CM | POA: Diagnosis not present

## 2020-03-03 DIAGNOSIS — M10322 Gout due to renal impairment, left elbow: Secondary | ICD-10-CM

## 2020-03-03 DIAGNOSIS — Z1159 Encounter for screening for other viral diseases: Secondary | ICD-10-CM

## 2020-03-03 DIAGNOSIS — Z7689 Persons encountering health services in other specified circumstances: Secondary | ICD-10-CM | POA: Diagnosis not present

## 2020-03-03 DIAGNOSIS — N184 Chronic kidney disease, stage 4 (severe): Secondary | ICD-10-CM

## 2020-03-03 DIAGNOSIS — Z8673 Personal history of transient ischemic attack (TIA), and cerebral infarction without residual deficits: Secondary | ICD-10-CM

## 2020-03-03 DIAGNOSIS — I10 Essential (primary) hypertension: Secondary | ICD-10-CM

## 2020-03-03 DIAGNOSIS — R7303 Prediabetes: Secondary | ICD-10-CM

## 2020-03-03 DIAGNOSIS — R1013 Epigastric pain: Secondary | ICD-10-CM

## 2020-03-03 MED ORDER — ASPIRIN 81 MG PO TBEC: DELAYED_RELEASE_TABLET | ORAL | 0 refills | Status: DC

## 2020-03-03 MED ORDER — CARVEDILOL 25 MG PO TABS: 50.0000 mg | ORAL_TABLET | Freq: Two times a day (BID) | ORAL | 1 refills | Status: DC

## 2020-03-03 MED ORDER — FAMOTIDINE 40 MG PO TABS: ORAL_TABLET | ORAL | 1 refills | Status: DC

## 2020-03-03 MED ORDER — AMLODIPINE BESYLATE 10 MG PO TABS: 10.0000 mg | ORAL_TABLET | Freq: Every day | ORAL | 1 refills | Status: DC

## 2020-03-03 MED ORDER — ALLOPURINOL 300 MG PO TABS: 300.0000 mg | ORAL_TABLET | Freq: Every day | ORAL | 1 refills | Status: DC

## 2020-03-03 MED ORDER — OLMESARTAN MEDOXOMIL 40 MG PO TABS: 40.0000 mg | ORAL_TABLET | Freq: Every day | ORAL | 1 refills | Status: DC

## 2020-03-03 MED ORDER — ATORVASTATIN CALCIUM 20 MG PO TABS: 20.0000 mg | ORAL_TABLET | Freq: Every day | ORAL | 0 refills | Status: DC

## 2020-03-03 MED ORDER — PANTOPRAZOLE SODIUM 40 MG PO TBEC
40.0000 mg | DELAYED_RELEASE_TABLET | Freq: Every day | ORAL | 3 refills | Status: DC
Start: 2020-03-03 — End: 2020-07-10

## 2020-03-03 MED ORDER — FUROSEMIDE 40 MG PO TABS: ORAL_TABLET | ORAL | 1 refills | Status: DC

## 2020-03-03 NOTE — Progress Notes (Signed)
Subjective:    CC: Chronic disease follow-up  HPI: Very pleasant 34 year old male presenting today for chronic disease follow-up on multiple conditions.  Hypertension/chronic systolic heart failure-reports he has been out of his hypertension medications for approximately 1 week.  He has been overdue for follow-up and lab work and he ran out of medication.  Reports blood pressure of 175/127 today is actually very good for him considering he has not had his medications in several days.  Was previously prescribed BiDil to take 3 times daily but patient reports he did not tolerate this well as it caused palpitations, shortness of breath, and a feeling of "drowning".  Denies chest pain, shortness of breath, palpitations, lower extremity edema, headaches, and dizziness.  Lost touch with his cardiologist during the pandemic but would like to get reestablished with another cardiologist for future heart care.  Hyperlipidemia-has not been taking Lipitor as prescribed but is open to restarting that today.  Gout-taking allopurinol 300 mg daily.  No recent gout flares.  Stage IV chronic kidney disease-patient is very aware of potential nephrotoxic agents and is very careful to avoid anything that could cause his kidneys damage.  Reports staying well-hydrated and no difficulty with urination or peripheral edema.  Prediabetes-history of elevated glucose in the prediabetes range.  Does not check glucose at home.  Denies polyuria, polydipsia, and polyphagia.  Dyspepsia-taking famotidine 40 mg daily as needed.  Reports he has not been taking it every day as he felt he would run out of medication and did not have any refills.  His reflux symptoms have been worse recently and he feels that the famotidine does not work as well as the ranitidine does.  Is trying to make dietary modifications but admits that he has a hard time with limiting some of his favorite foods.   I reviewed the past medical history, family  history, social history, surgical history, and allergies today and no changes were needed.  Please see the problem list section below in epic for further details.  Past Medical History: Past Medical History:  Diagnosis Date  . Arthritis   . CKD stage 4 secondary to hypertension ([place]) 03/28/2017  . Elevated blood uric acid level   . Essential hypertension   . GERD (gastroesophageal reflux disease)   . Gout   . History of cardiomyopathy    LVEF 15% in 2010 - evaluated by [facility] and Dr. Caryl Comes  . History of stroke 2010   Right MCA distribution infarct, felt to be possibly embolic in the setting of cardiomyopathy - placed on Coumadin at that time  . Hyperlipidemia   . Nonischemic cardiomyopathy ([place]) 03/28/2017  . Obesity   . Prediabetes 05/03/2017   Past Surgical History: History reviewed. No pertinent surgical history. Social History: Social History   Socioeconomic History  . Marital status: Single    Spouse name: Not on file  . Number of children: Not on file  . Years of education: Not on file  . Highest education level: Not on file  Occupational History  . Not on file  Tobacco Use  . Smoking status: Never Smoker  . Smokeless tobacco: Never Used  Vaping Use  . Vaping Use: Never used  Substance and Sexual Activity  . Alcohol use: No    Alcohol/week: 0.0 standard drinks  . Drug use: No  . Sexual activity: Not Currently  Other Topics Concern  . Not on file  Social History Narrative  . Not on file   Social Determinants of Health  Financial Resource Strain:   . Difficulty of Paying Living Expenses:   Food Insecurity:   . Worried About Charity fundraiser in the Last Year:   . Arboriculturist in the Last Year:   Transportation Needs:   . Film/video editor (Medical):   Marland Kitchen Lack of Transportation (Non-Medical):   Physical Activity:   . Days of Exercise per Week:   . Minutes of Exercise per Session:   Stress:   . Feeling of Stress :   Social Connections:   .  Frequency of Communication with Friends and Family:   . Frequency of Social Gatherings with Friends and Family:   . Attends Religious Services:   . Active Member of Clubs or Organizations:   . Attends Archivist Meetings:   Marland Kitchen Marital Status:    Family History: Family History  Problem Relation Age of Onset  . Stroke Cousin   . Hypertension Mother   . Heart attack Mother        Died age 50  . Hypertension Father    Allergies: Allergies  Allergen Reactions  . Albuterol Other (See Comments)    Reaction is unknown. Allergy was entered post Stroke "caused him to have a stroke"    . Tramadol Nausea Only    Made patient disoriented   Medications: See med rec.  Review of Systems: No fevers, chills, night sweats, weight loss, chest pain, or shortness of breath.   Objective:    General: Well Developed, well nourished, and in no acute distress.  Neuro: Alert and oriented x3.  HEENT: Normocephalic, atraumatic.  Skin: Warm and dry. Cardiac: Regular rate and rhythm, no murmurs rubs or gallops, no lower extremity edema.  Respiratory: Clear to auscultation bilaterally. Not using accessory muscles, speaking in full sentences.   Impression and Recommendations:    1. Encounter to establish care Reviewed medical records and discussed history with patient.  Due for hepatitis C and HIV screening.  Also due for Tdap and pneumococcal vaccine.  Would like to wait on the vaccines but is open to the screening blood work today.  2. Chronic systolic heart failure (HCC) Restart Coreg 50 mg twice daily with meals, Benicar 40 mg daily.  Referring to cardiology to reestablish cardiology relationship.  Checking CBC, CMP, and lipid panel today. - carvedilol (COREG) 25 MG tablet; Take 2 tablets (50 mg total) by mouth 2 (two) times daily with a meal.  Dispense: 180 tablet; Refill: 1 - CBC - COMPLETE METABOLIC PANEL WITH GFR - Lipid panel - olmesartan (BENICAR) 40 MG tablet; Take 1 tablet (40  mg total) by mouth daily.  Dispense: 90 tablet; Refill: 1 - Ambulatory referral to Cardiology  3. Uncontrolled stage 2 hypertension Restart amlodipine 10 mg daily.  Checking lab work as above.  Recommend restarting aspirin 81 mg daily due to history of stroke and uncontrolled hypertension. - CBC - COMPLETE METABOLIC PANEL WITH GFR - Lipid panel - amLODipine (NORVASC) 10 MG tablet; Take 1 tablet (10 mg total) by mouth daily.  Dispense: 90 tablet; Refill: 1 - aspirin (ASPIRIN LOW DOSE) 81 MG EC tablet; TAKE 1 TABLET(81 MG) BY MOUTH DAILY  Dispense: 90 tablet; Refill: 0  4. Dyslipidemia, goal LDL below 100 Checking lipid panel today.  Restart atorvastatin 20 mg daily.  Encouraged low-fat, heart healthy diet. - Lipid panel - atorvastatin (LIPITOR) 20 MG tablet; Take 1 tablet (20 mg total) by mouth daily.  Dispense: 90 tablet; Refill: 0  5. Prediabetes Checking  hemoglobin A1c today. - Hemoglobin A1c  6. Dyspepsia Continue famotidine 40 mg daily.  Adding in pantoprazole 40 mg nightly to see if we can get better control of his symptoms.  Discussed dietary modifications necessary for reflux control. - famotidine (PEPCID) 40 MG tablet; TAKE 1 TABLET BY MOUTH DAILY  Dispense: 90 tablet; Refill: 1  7. Acute gout due to renal impairment involving left elbow Continue allopurinol 300 mg daily. - allopurinol (ZYLOPRIM) 300 MG tablet; Take 1 tablet (300 mg total) by mouth daily.  Dispense: 90 tablet; Refill: 1  8. History of stroke Restarting aspirin 81 mg daily. - aspirin (ASPIRIN LOW DOSE) 81 MG EC tablet; TAKE 1 TABLET(81 MG) BY MOUTH DAILY  Dispense: 90 tablet; Refill: 0  9.  CKD stage IV Continue to stay well-hydrated and avoid nephrotoxic agents.  Discussed importance of maintaining control of blood pressure to prevent further kidney damage.  10. Need for hepatitis C screening test Patient amenable to screening so we will add hepatitis C antibody to lab work today. - Hepatitis C  antibody  11. Encounter for screening for HIV Patient amenable to HIV antibody screening so we will do that with lab work today as well. - HIV Antibody (routine testing w rflx)  Return in about 2 weeks (around 03/17/2020) for nurse visit for blood pressure check. ___________________________________________ Clearnce Sorrel, DNP, APRN, FNP-BC Primary Care and [place]

## 2020-03-04 ENCOUNTER — Other Ambulatory Visit: Payer: Self-pay

## 2020-03-04 DIAGNOSIS — N185 Chronic kidney disease, stage 5: Secondary | ICD-10-CM

## 2020-03-04 LAB — COMPLETE METABOLIC PANEL WITH GFR
AG Ratio: 1.1 (calc) (ref 1.0–2.5)
ALT: 17 U/L (ref 9–46)
AST: 15 U/L (ref 10–40)
Albumin: 3.7 g/dL (ref 3.6–5.1)
Alkaline phosphatase (APISO): 109 U/L (ref 36–130)
BUN/Creatinine Ratio: 7 (calc) (ref 6–22)
BUN: 46 mg/dL — ABNORMAL HIGH (ref 7–25)
CO2: 25 mmol/L (ref 20–32)
Calcium: 9 mg/dL (ref 8.6–10.3)
Chloride: 107 mmol/L (ref 98–110)
Creat: 6.75 mg/dL — ABNORMAL HIGH (ref 0.60–1.35)
GFR, Est African American: 11 mL/min/{1.73_m2} — ABNORMAL LOW (ref >=60)
GFR, Est Non African American: 10 mL/min/{1.73_m2} — ABNORMAL LOW (ref >=60)
Globulin: 3.3 g/dL (calc) (ref 1.9–3.7)
Glucose, Bld: 85 mg/dL (ref 65–99)
Potassium: 4.4 mmol/L (ref 3.5–5.3)
Sodium: 140 mmol/L (ref 135–146)
Total Bilirubin: 0.6 mg/dL (ref 0.2–1.2)
Total Protein: 7 g/dL (ref 6.1–8.1)

## 2020-03-04 LAB — LIPID PANEL
Cholesterol: 219 mg/dL — ABNORMAL HIGH (ref <200)
HDL: 30 mg/dL — ABNORMAL LOW (ref >=40)
LDL Cholesterol (Calc): 164 mg/dL (calc) — ABNORMAL HIGH
Non-HDL Cholesterol (Calc): 189 mg/dL (calc) — ABNORMAL HIGH (ref <130)
Total CHOL/HDL Ratio: 7.3 (calc) — ABNORMAL HIGH (ref <5.0)
Triglycerides: 123 mg/dL (ref <150)

## 2020-03-04 LAB — HEMOGLOBIN A1C
Hgb A1c MFr Bld: 5.2 % of total Hgb (ref <5.7)
Mean Plasma Glucose: 103 (calc)
eAG (mmol/L): 5.7 (calc)

## 2020-03-04 LAB — HEPATITIS C ANTIBODY
Hepatitis C Ab: NONREACTIVE
SIGNAL TO CUT-OFF: 0.03 (ref <1.00)

## 2020-03-04 LAB — CBC
HCT: 46 % (ref 38.5–50.0)
Hemoglobin: 15.6 g/dL (ref 13.2–17.1)
MCH: 30.3 pg (ref 27.0–33.0)
MCHC: 33.9 g/dL (ref 32.0–36.0)
MCV: 89.3 fL (ref 80.0–100.0)
MPV: 10.1 fL (ref 7.5–12.5)
Platelets: 286 10*3/uL (ref 140–400)
RBC: 5.15 10*6/uL (ref 4.20–5.80)
RDW: 14.3 % (ref 11.0–15.0)
WBC: 7.6 10*3/uL (ref 3.8–10.8)

## 2020-03-04 LAB — HIV ANTIBODY (ROUTINE TESTING W REFLEX): HIV 1&2 Ab, 4th Generation: NONREACTIVE

## 2020-03-07 ENCOUNTER — Ambulatory Visit: Payer: BLUE CROSS/BLUE SHIELD | Admitting: Internal Medicine

## 2020-03-07 ENCOUNTER — Ambulatory Visit (INDEPENDENT_AMBULATORY_CARE_PROVIDER_SITE_OTHER): Payer: BLUE CROSS/BLUE SHIELD | Admitting: Medical-Surgical

## 2020-03-07 DIAGNOSIS — N185 Chronic kidney disease, stage 5: Secondary | ICD-10-CM

## 2020-03-07 NOTE — Progress Notes (Signed)
Pt presents to clinic today for BP check. Denies any cp/sob/palpitaions/headaches/dizziness or swelling. Taking medication as directed.   Lab orders printed and given to pt.  pcp spoke w/pt before he left office.

## 2020-03-07 NOTE — Progress Notes (Signed)
Patient presenting for nurse visit for blood pressure check.  He has been taking his medications as prescribed although we did decrease the olmesartan to 20 mg daily.  Blood pressures are looking much better today in the 140s/90s.  He is going downstairs to get his lab work redrawn to evaluate to see if there is any improvement in his GFR, BUN, and creatinine.  He is understandably concerned about his kidney function worsening.  He reports he has a cardiology appointment this afternoon.  He also has a schedule appointment with nephrology next Wednesday.  He is not having any signs of fluid volume overload at this point and denies chest pain, shortness of breath.  For now we will continue his current blood pressure regimen and wait to hear with nephrology and cardiology have to say.  Advised patient that if he has any needs or concerns he is welcome to reach out.  Patient verbalized understanding and is agreeable to the plan.

## 2020-03-08 LAB — COMPLETE METABOLIC PANEL WITH GFR
AG Ratio: 1.2 (calc) (ref 1.0–2.5)
ALT: 14 U/L (ref 9–46)
AST: 12 U/L (ref 10–40)
Albumin: 3.8 g/dL (ref 3.6–5.1)
Alkaline phosphatase (APISO): 98 U/L (ref 36–130)
BUN/Creatinine Ratio: 8 (calc) (ref 6–22)
BUN: 57 mg/dL — ABNORMAL HIGH (ref 7–25)
CO2: 23 mmol/L (ref 20–32)
Calcium: 9 mg/dL (ref 8.6–10.3)
Chloride: 108 mmol/L (ref 98–110)
Creat: 7.45 mg/dL — ABNORMAL HIGH (ref 0.60–1.35)
GFR, Est African American: 10 mL/min/{1.73_m2} — ABNORMAL LOW (ref >=60)
GFR, Est Non African American: 9 mL/min/{1.73_m2} — ABNORMAL LOW (ref >=60)
Globulin: 3.3 g/dL (calc) (ref 1.9–3.7)
Glucose, Bld: 85 mg/dL (ref 65–99)
Potassium: 4.1 mmol/L (ref 3.5–5.3)
Sodium: 142 mmol/L (ref 135–146)
Total Bilirubin: 0.4 mg/dL (ref 0.2–1.2)
Total Protein: 7.1 g/dL (ref 6.1–8.1)

## 2020-03-19 ENCOUNTER — Ambulatory Visit: Payer: BLUE CROSS/BLUE SHIELD

## 2020-03-21 ENCOUNTER — Ambulatory Visit: Payer: BLUE CROSS/BLUE SHIELD

## 2020-04-03 ENCOUNTER — Ambulatory Visit: Payer: BLUE CROSS/BLUE SHIELD | Admitting: Internal Medicine

## 2020-04-23 ENCOUNTER — Other Ambulatory Visit: Payer: Self-pay

## 2020-04-23 DIAGNOSIS — N184 Chronic kidney disease, stage 4 (severe): Secondary | ICD-10-CM

## 2020-04-30 ENCOUNTER — Encounter: Payer: Self-pay | Admitting: Medical-Surgical

## 2020-04-30 NOTE — Telephone Encounter (Signed)
Joy,   Usually we do not do prior auths for these unless patient truly cannot tolerate a different RX. Do you have a recommendation for patient or alternate RX?     IF PA needs to be done, Key: [id number]

## 2020-05-01 ENCOUNTER — Other Ambulatory Visit: Payer: Self-pay | Admitting: Medical-Surgical

## 2020-05-01 DIAGNOSIS — M10322 Gout due to renal impairment, left elbow: Secondary | ICD-10-CM

## 2020-05-06 ENCOUNTER — Ambulatory Visit (INDEPENDENT_AMBULATORY_CARE_PROVIDER_SITE_OTHER)
Admission: RE | Admit: 2020-05-06 | Discharge: 2020-05-06 | Disposition: A | Discharge status: Home / Self Care | Payer: BLUE CROSS/BLUE SHIELD | Source: Ambulatory Visit | Attending: Vascular Surgery | Admitting: Vascular Surgery

## 2020-05-06 ENCOUNTER — Encounter: Payer: BLUE CROSS/BLUE SHIELD | Admitting: Vascular Surgery

## 2020-05-06 ENCOUNTER — Other Ambulatory Visit: Payer: Self-pay

## 2020-05-06 ENCOUNTER — Ambulatory Visit (HOSPITAL_COMMUNITY)
Admission: RE | Admit: 2020-05-06 | Discharge: 2020-05-06 | Disposition: A | Discharge status: Home / Self Care | Payer: BLUE CROSS/BLUE SHIELD | Source: Ambulatory Visit | Attending: Vascular Surgery | Admitting: Vascular Surgery

## 2020-05-06 DIAGNOSIS — N184 Chronic kidney disease, stage 4 (severe): Secondary | ICD-10-CM | POA: Insufficient documentation

## 2020-05-07 ENCOUNTER — Telehealth (INDEPENDENT_AMBULATORY_CARE_PROVIDER_SITE_OTHER): Payer: BLUE CROSS/BLUE SHIELD | Admitting: Medical-Surgical

## 2020-05-07 ENCOUNTER — Encounter: Payer: Self-pay | Admitting: Medical-Surgical

## 2020-05-07 VITALS — BP 132/82

## 2020-05-07 DIAGNOSIS — M10371 Gout due to renal impairment, right ankle and foot: Secondary | ICD-10-CM | POA: Insufficient documentation

## 2020-05-07 MED ORDER — PREDNISONE 50 MG PO TABS: 50.0000 mg | ORAL_TABLET | Freq: Every day | ORAL | 0 refills | Status: DC

## 2020-05-07 NOTE — Progress Notes (Signed)
Virtual Visit via Video Note  I connected with Zachary Lynch on 05/07/20 at 11:30 AM EDT by a video enabled telemedicine application and verified that I am speaking with the correct person using two identifiers.   I discussed the limitations of evaluation and management by telemedicine and the availability of in person appointments. The patient expressed understanding and agreed to proceed.  Patient location: home Provider locations: office  Subjective:    CC: Gout flare  HPI:  Pleasant 34 year old male presenting via MyChart video visit with reports of a right great toe gout flare.  Notes that he saw nephrology who decreased his dose of allopurinol as it was a potential nephrotoxic medication at that particular dose.  Because he is very aware of potential injuries to his kidney function, he stopped taking the allopurinol altogether.  He was warned that he may have a gout flare because of this.  Last week he started having some pain in the left great toe that resolved.  Unfortunately the pain has now developed in his right great toe.  No swelling or significant redness to the great toe or the right first MTP joint, but the joint is very warm and very painful.  Difficulty with walking due to pain.  He does not drink alcohol but admits to eating [place] a few days ago and may have caused a worsening of his symptoms.  Denies fevers, chills, chest pain, shortness of breath, and other myalgias/arthralgias.  Has not taken any over-the-counter medications to treat his symptoms.  Past medical history, Surgical history, Family history not pertinant except as noted below, Social history, Allergies, and medications have been entered into the medical record, reviewed, and corrections made.   Review of Systems: See HPI for pertinent positives and negatives.   Objective:    General: Speaking clearly in complete sentences without any shortness of breath.  Alert and oriented x3.  Normal judgment. No apparent  acute distress.  Impression and Recommendations:    1. Acute gout due to renal impairment involving right foot Prednisone 50mg  daily x 5 days. Discussed gout prevention and dietary changes to lessen likelihood of further flares. Recommend supportive shoes. May use Tylenol as needed. Avoid NSAIDs.  No follow-ups on file.  20 minutes of non-face-to-face time was provided during this encounter.  I discussed the assessment and treatment plan with the patient. The patient was provided an opportunity to ask questions and all were answered. The patient agreed with the plan and demonstrated an understanding of the instructions.   The patient was advised to call back or seek an in-person evaluation if the symptoms worsen or if the condition fails to improve as anticipated.  Clearnce Sorrel, DNP, APRN, FNP-BC [place] [facility] and Sports Medicine

## 2020-05-07 NOTE — Addendum Note (Signed)
Addended bySamuel Bouche on: 05/07/2020 01:11 PM   Modules accepted: Orders

## 2020-05-09 ENCOUNTER — Encounter: Payer: BLUE CROSS/BLUE SHIELD | Admitting: Vascular Surgery

## 2020-05-24 ENCOUNTER — Other Ambulatory Visit: Payer: Self-pay | Admitting: Medical-Surgical

## 2020-05-24 DIAGNOSIS — I5022 Chronic systolic (congestive) heart failure: Secondary | ICD-10-CM

## 2020-05-31 ENCOUNTER — Encounter: Payer: Self-pay | Admitting: Medical-Surgical

## 2020-06-02 ENCOUNTER — Other Ambulatory Visit: Payer: Self-pay

## 2020-06-02 DIAGNOSIS — I5022 Chronic systolic (congestive) heart failure: Secondary | ICD-10-CM

## 2020-06-02 MED ORDER — CARVEDILOL 25 MG PO TABS: 50.0000 mg | ORAL_TABLET | Freq: Two times a day (BID) | ORAL | 1 refills | Status: DC

## 2020-06-20 ENCOUNTER — Other Ambulatory Visit: Payer: Self-pay | Admitting: Medical-Surgical

## 2020-06-20 ENCOUNTER — Encounter: Payer: Self-pay | Admitting: Medical-Surgical

## 2020-06-20 MED ORDER — PREDNISONE 50 MG PO TABS: 50.0000 mg | ORAL_TABLET | Freq: Every day | ORAL | 0 refills | Status: DC

## 2020-07-10 ENCOUNTER — Other Ambulatory Visit: Payer: Self-pay | Admitting: Osteopathic Medicine

## 2020-07-10 ENCOUNTER — Encounter: Payer: Self-pay | Admitting: Osteopathic Medicine

## 2020-07-10 ENCOUNTER — Telehealth (INDEPENDENT_AMBULATORY_CARE_PROVIDER_SITE_OTHER): Payer: BLUE CROSS/BLUE SHIELD | Admitting: Osteopathic Medicine

## 2020-07-10 DIAGNOSIS — M10322 Gout due to renal impairment, left elbow: Secondary | ICD-10-CM

## 2020-07-10 DIAGNOSIS — E785 Hyperlipidemia, unspecified: Secondary | ICD-10-CM | POA: Diagnosis not present

## 2020-07-10 DIAGNOSIS — I10 Essential (primary) hypertension: Secondary | ICD-10-CM | POA: Diagnosis not present

## 2020-07-10 DIAGNOSIS — I5022 Chronic systolic (congestive) heart failure: Secondary | ICD-10-CM | POA: Diagnosis not present

## 2020-07-10 MED ORDER — OLMESARTAN MEDOXOMIL 40 MG PO TABS: 40.0000 mg | ORAL_TABLET | Freq: Every day | ORAL | 1 refills | Status: DC

## 2020-07-10 MED ORDER — ALLOPURINOL 100 MG PO TABS: 100.0000 mg | ORAL_TABLET | Freq: Every day | ORAL | 1 refills | Status: DC

## 2020-07-10 MED ORDER — PANTOPRAZOLE SODIUM 40 MG PO TBEC: 40.0000 mg | DELAYED_RELEASE_TABLET | Freq: Every day | ORAL | 1 refills | Status: DC

## 2020-07-10 MED ORDER — CARVEDILOL 25 MG PO TABS: 50.0000 mg | ORAL_TABLET | Freq: Two times a day (BID) | ORAL | 1 refills | Status: DC

## 2020-07-10 MED ORDER — ATORVASTATIN CALCIUM 20 MG PO TABS: 20.0000 mg | ORAL_TABLET | Freq: Every day | ORAL | 1 refills | Status: DC

## 2020-07-10 MED ORDER — AMLODIPINE BESYLATE 10 MG PO TABS: 10.0000 mg | ORAL_TABLET | Freq: Every day | ORAL | 1 refills | Status: DC

## 2020-07-10 NOTE — Progress Notes (Signed)
Virtual Visit via Video (App used: MyChart) Note  I connected with      Zachary Lynch on 07/10/20 at 8:38 AM  by a telemedicine application and verified that I am speaking with the correct person using two identifiers.  Patient is at work I am in office   I discussed the limitations of evaluation and management by telemedicine and the availability of in person appointments. The patient expressed understanding and agreed to proceed.  History of Present Illness: Zachary Lynch is a 34 y.o. male who would like to discuss refills  Coming due for refills of medication.  Has an appointment with his nephrologist tomorrow.  Significant kidney disease, patient states he will be getting labs tomorrow.  I have asked him to make sure his nephrologist forwards [place] copy of lab results, other test results, and any medication changes.  His most recent blood pressure shows good control, will have him follow-up with PCP in 6 months/sooner if needed.   BP Readings from Last 3 Encounters:  05/07/20 132/82  03/07/20 (!) 144/92  03/03/20 (!) 175/127         Observations/Objective: Wt 295 lb (133.8 kg)   BMI 41.14 kg/m  BP Readings from Last 3 Encounters:  05/07/20 132/82  03/07/20 (!) 144/92  03/03/20 (!) 175/127   Exam: Normal Speech.  NAD  Lab and Radiology Results No results found for this or any previous visit (from the past 72 hour(s)). No results found.     Assessment and Plan: 34 y.o. male with Diagnoses of Acute gout due to renal impairment involving left elbow, Uncontrolled stage 2 hypertension, Dyslipidemia, goal LDL below 100, and Chronic systolic heart failure ([place]) were pertinent to this visit.   PDMP not reviewed this encounter. No orders of the defined types were placed in this encounter.  Meds ordered this encounter  Medications  . allopurinol (ZYLOPRIM) 100 MG tablet    Sig: Take 1 tablet (100 mg total) by mouth daily.    Dispense:  90 tablet    Refill:  1  .  amLODipine (NORVASC) 10 MG tablet    Sig: Take 1 tablet (10 mg total) by mouth daily.    Dispense:  90 tablet    Refill:  1  . atorvastatin (LIPITOR) 20 MG tablet    Sig: Take 1 tablet (20 mg total) by mouth daily.    Dispense:  90 tablet    Refill:  1  . carvedilol (COREG) 25 MG tablet    Sig: Take 2 tablets (50 mg total) by mouth 2 (two) times daily with a meal.    Dispense:  360 tablet    Refill:  1  . olmesartan (BENICAR) 40 MG tablet    Sig: Take 1 tablet (40 mg total) by mouth daily.    Dispense:  90 tablet    Refill:  1  . pantoprazole (PROTONIX) 40 MG tablet    Sig: Take 1 tablet (40 mg total) by mouth daily.    Dispense:  90 tablet    Refill:  1      Follow Up Instructions: Return in about 6 months (around 01/08/2021) for [employer] in office with PCP, see [place] sooner if needed.    I discussed the assessment and treatment plan with the patient. The patient was provided an opportunity to ask questions and all were answered. The patient agreed with the plan and demonstrated an understanding of the instructions.   The patient was advised to  call back or seek an in-person evaluation if any new concerns, if symptoms worsen or if the condition fails to improve as anticipated.  20 minutes of non-face-to-face time was provided during this encounter.      . . . . . . . . . . . . . Marland Kitchen                   Historical information moved to improve visibility of documentation.  Past Medical History:  Diagnosis Date  . Arthritis   . CKD stage 4 secondary to hypertension ([place]) 03/28/2017  . Elevated blood uric acid level   . Essential hypertension   . GERD (gastroesophageal reflux disease)   . Gout   . History of cardiomyopathy    LVEF 15% in 2010 - evaluated by [facility] and Dr. Caryl Comes  . History of stroke 2010   Right MCA distribution infarct, felt to be possibly embolic in the setting of cardiomyopathy - placed on Coumadin at that time  .  Hyperlipidemia   . Nonischemic cardiomyopathy ([place]) 03/28/2017  . Obesity   . Prediabetes 05/03/2017   No past surgical history on file. Social History   Tobacco Use  . Smoking status: Never Smoker  . Smokeless tobacco: Never Used  Substance Use Topics  . Alcohol use: No    Alcohol/week: 0.0 standard drinks   family history includes Heart attack in his mother; Hypertension in his father and mother; Stroke in his cousin.  Medications: Current Outpatient Medications  Medication Sig Dispense Refill  . allopurinol (ZYLOPRIM) 100 MG tablet Take 1 tablet (100 mg total) by mouth daily. 90 tablet 1  . amLODipine (NORVASC) 10 MG tablet Take 1 tablet (10 mg total) by mouth daily. 90 tablet 1  . APPLE CIDER VINEGAR PO Take 1 capsule by mouth daily as needed.    Marland Kitchen aspirin (ASPIRIN LOW DOSE) 81 MG EC tablet TAKE 1 TABLET(81 MG) BY MOUTH DAILY 90 tablet 0  . atorvastatin (LIPITOR) 20 MG tablet Take 1 tablet (20 mg total) by mouth daily. 90 tablet 1  . calcitRIOL (ROCALTROL) 0.25 MCG capsule Take 0.25 mcg by mouth daily.    . carvedilol (COREG) 25 MG tablet Take 2 tablets (50 mg total) by mouth 2 (two) times daily with a meal. 360 tablet 1  . doxazosin (CARDURA) 2 MG tablet Take 2 mg by mouth at bedtime.    . furosemide (LASIX) 40 MG tablet Take 1 tablet by mouth daily. 90 tablet 1  . olmesartan (BENICAR) 40 MG tablet Take 1 tablet (40 mg total) by mouth daily. 90 tablet 1  . pantoprazole (PROTONIX) 40 MG tablet Take 1 tablet (40 mg total) by mouth daily. 90 tablet 1  . predniSONE (DELTASONE) 50 MG tablet Take 1 tablet (50 mg total) by mouth daily. 5 tablet 0  . ranitidine (ZANTAC) 300 MG capsule Take 1 capsule by mouth daily as needed.     No current facility-administered medications for this visit.   Allergies  Allergen Reactions  . Albuterol Other (See Comments)    Reaction is unknown. Allergy was entered post Stroke "caused him to have a stroke"    . Tramadol Nausea Only    Made patient  disoriented

## 2020-07-23 ENCOUNTER — Other Ambulatory Visit: Payer: Self-pay | Admitting: Medical-Surgical

## 2020-07-23 DIAGNOSIS — Z8673 Personal history of transient ischemic attack (TIA), and cerebral infarction without residual deficits: Secondary | ICD-10-CM

## 2020-07-23 DIAGNOSIS — I10 Essential (primary) hypertension: Secondary | ICD-10-CM

## 2020-10-18 ENCOUNTER — Emergency Department: Payer: BC Managed Care – PPO

## 2020-10-18 ENCOUNTER — Emergency Department
Admission: EM | Admit: 2020-10-18 | Discharge: 2020-10-18 | Disposition: A | Discharge status: Home / Self Care | Payer: BC Managed Care – PPO | Attending: Emergency Medicine | Admitting: Emergency Medicine

## 2020-10-18 ENCOUNTER — Other Ambulatory Visit: Payer: Self-pay

## 2020-10-18 DIAGNOSIS — I5022 Chronic systolic (congestive) heart failure: Secondary | ICD-10-CM | POA: Diagnosis not present

## 2020-10-18 DIAGNOSIS — J189 Pneumonia, unspecified organism: Secondary | ICD-10-CM

## 2020-10-18 DIAGNOSIS — Z7982 Long term (current) use of aspirin: Secondary | ICD-10-CM | POA: Diagnosis not present

## 2020-10-18 DIAGNOSIS — Z20822 Contact with and (suspected) exposure to covid-19: Secondary | ICD-10-CM | POA: Diagnosis not present

## 2020-10-18 DIAGNOSIS — Z8673 Personal history of transient ischemic attack (TIA), and cerebral infarction without residual deficits: Secondary | ICD-10-CM | POA: Diagnosis not present

## 2020-10-18 DIAGNOSIS — Z79899 Other long term (current) drug therapy: Secondary | ICD-10-CM | POA: Diagnosis not present

## 2020-10-18 DIAGNOSIS — N184 Chronic kidney disease, stage 4 (severe): Secondary | ICD-10-CM | POA: Diagnosis not present

## 2020-10-18 DIAGNOSIS — I130 Hypertensive heart and chronic kidney disease with heart failure and stage 1 through stage 4 chronic kidney disease, or unspecified chronic kidney disease: Secondary | ICD-10-CM | POA: Diagnosis not present

## 2020-10-18 DIAGNOSIS — R079 Chest pain, unspecified: Secondary | ICD-10-CM | POA: Diagnosis present

## 2020-10-18 LAB — BASIC METABOLIC PANEL
Anion gap: 13 (ref 5–15)
BUN: 52 mg/dL — ABNORMAL HIGH (ref 6–20)
CO2: 20 mmol/L — ABNORMAL LOW (ref 22–32)
Calcium: 9.2 mg/dL (ref 8.9–10.3)
Chloride: 106 mmol/L (ref 98–111)
Creatinine, Ser: 7.71 mg/dL — ABNORMAL HIGH (ref 0.61–1.24)
GFR, Estimated: 9 mL/min — ABNORMAL LOW (ref >60)
Glucose, Bld: 147 mg/dL — ABNORMAL HIGH (ref 70–99)
Potassium: 3.8 mmol/L (ref 3.5–5.1)
Sodium: 139 mmol/L (ref 135–145)

## 2020-10-18 LAB — CBC
HCT: 37.9 % — ABNORMAL LOW (ref 39.0–52.0)
Hemoglobin: 12.5 g/dL — ABNORMAL LOW (ref 13.0–17.0)
MCH: 29.3 pg (ref 26.0–34.0)
MCHC: 33 g/dL (ref 30.0–36.0)
MCV: 89 fL (ref 80.0–100.0)
Platelets: 267 10*3/uL (ref 150–400)
RBC: 4.26 MIL/uL (ref 4.22–5.81)
RDW: 13.8 % (ref 11.5–15.5)
WBC: 14.4 10*3/uL — ABNORMAL HIGH (ref 4.0–10.5)
nRBC: 0 % (ref 0.0–0.2)

## 2020-10-18 LAB — PROCALCITONIN: Procalcitonin: 0.21 ng/mL

## 2020-10-18 LAB — RESP PANEL BY RT-PCR (FLU A&B, COVID) ARPGX2
Influenza A by PCR: NEGATIVE
Influenza B by PCR: NEGATIVE
SARS Coronavirus 2 by RT PCR: NEGATIVE

## 2020-10-18 LAB — TROPONIN I (HIGH SENSITIVITY)
Troponin I (High Sensitivity): 24 ng/L — ABNORMAL HIGH (ref <18)
Troponin I (High Sensitivity): 23 ng/L — ABNORMAL HIGH (ref <18)

## 2020-10-18 MED ORDER — AZITHROMYCIN 250 MG PO TABS: ORAL_TABLET | ORAL | 0 refills | Status: DC

## 2020-10-18 MED ORDER — SODIUM CHLORIDE 0.9 % IV SOLN
1.0000 g | Freq: Once | INTRAVENOUS | Status: AC
  Administered 2020-10-18: 1 g via INTRAVENOUS
  Filled 2020-10-18: qty 10

## 2020-10-18 MED ORDER — AZITHROMYCIN 500 MG PO TABS
500.0000 mg | ORAL_TABLET | Freq: Once | ORAL | Status: AC
  Administered 2020-10-18: 500 mg via ORAL
  Filled 2020-10-18: qty 1

## 2020-10-18 MED ORDER — ACETAMINOPHEN 500 MG PO TABS
1000.0000 mg | ORAL_TABLET | Freq: Once | ORAL | Status: AC
  Administered 2020-10-18: 1000 mg via ORAL
  Filled 2020-10-18: qty 2

## 2020-10-18 MED ORDER — LACTATED RINGERS IV BOLUS: 500.0000 mL | Freq: Once | INTRAVENOUS | Status: DC

## 2020-10-18 MED ORDER — AMOXICILLIN-POT CLAVULANATE 875-125 MG PO TABS: 1.0000 | ORAL_TABLET | Freq: Two times a day (BID) | ORAL | 0 refills | Status: AC

## 2020-10-18 MED ORDER — AMOXICILLIN-POT CLAVULANATE 875-125 MG PO TABS: 1.0000 | ORAL_TABLET | Freq: Two times a day (BID) | ORAL | 0 refills | Status: DC

## 2020-10-18 MED ORDER — TECHNETIUM TO 99M ALBUMIN AGGREGATED
4.0000 | Freq: Once | INTRAVENOUS | Status: AC | PRN
  Administered 2020-10-18: 4.406 via INTRAVENOUS

## 2020-10-18 NOTE — ED Provider Notes (Signed)
I assumed care of this patient approximately 0 700.  Please see outgoing progress note for full details regarding patient's initial evaluation assessment.  In brief patient presents with history of CKD, CVA, CHF, prediabetes and obesity for assessment of some chest pain that began last night in the setting of approximately 3 weeks of cold-like symptoms including cough and congestion.  Initial work-up including chest x-ray and CT without contrast of the chest concerning for pneumonia versus pulmonary infarct.  Patient treated empirically for CAP.  Plan is to obtain VQ scan to assess for possible PE.  CBC with WBC count of 14.4 with hemoglobin of 12.5 compared to 15.67 months ago.  BMP with creatinine at baseline and no other significant electrolyte or metabolic derangements.  Initial troponin is 24.  In addition to obtaining VQ scan plan is to follow-up repeat troponin procalcitonin and Covid swab.  Code PCR is negative.  Repeat troponin has down trended to 23 from 24.  Given history of CKD and stability over 2 hours low suspicion for ACS or myocarditis at this time.  Procalcitonin 0.21.  NM pulmonary perfusion scan is unremarkable for evidence of PE.  Suspect may require pneumonia.  Patient discharged stable condition.  Return precautions advised and discussed.  Patient updated on results of the studies and expected clinical course.  Strict return precautions provided and discussed.  Patient will receive Rx for antibiotics to cover for commune acquired pneumonia.   Lucrezia Starch, MD 10/18/20 1002

## 2020-10-18 NOTE — ED Notes (Addendum)
Pt arrives from triage via w/c -- alert and oriented x4 -- pt observed to speaking in broken sentences - reports h/o CVA 2010 and previous EF 35% 2010 where he wore an "external defribrillator" for a year; also reports h/o HTN and HLD.  States sob and cp worsened around midnite today and reports he has been having a cough for three weeks with associated nasal congestion; had been taking OTC Coricidin for symptom mgmt with little relief.  Plan for labs and CXR as pt awaits ED provider-  Pt oriented to enviroment - call bell and urinal in reach.

## 2020-10-18 NOTE — ED Provider Notes (Signed)
[facility] Emergency Department Provider Note  ____________________________________________  Time seen: Approximately 5:13 AM  I have reviewed the triage vital signs and the nursing notes.   HISTORY  Chief Complaint Chest Pain   HPI Zachary Lynch is a 35 y.o. male with history of CKD stage 4, CVA, CHF w/ EF 30-35% (echo from 2019), prediabetes, obesity who presents for evaluation of CP. Patient reports that he was sick with cold like symptoms 3 weeks ago. Has been tested for covid weekly and all negative with the last test 2 days ago.  Patient reports that he feels like he recovered from the Covid however has had a persistent cough productive of yellow sputum and nasal congestion.  This evening he was trying to go to sleep when he developed sudden onset of sharp central pleuritic chest pain.  The pain has been persistent and constant since it started.  He denies any prior history of PE or DVT, no recent travel immobilization, no leg pain or swelling, no hemoptysis or exogenous hormones.  No shortness of breath.  He denies any fever, body aches, vomiting or diarrhea.  He is not vaccinated against Covid.  Past Medical History:  Diagnosis Date  . Arthritis   . CKD stage 4 secondary to hypertension ([place]) 03/28/2017  . Elevated blood uric acid level   . Essential hypertension   . GERD (gastroesophageal reflux disease)   . Gout   . History of cardiomyopathy    LVEF 15% in 2010 - evaluated by [facility] and Dr. Caryl Comes  . History of stroke 2010   Right MCA distribution infarct, felt to be possibly embolic in the setting of cardiomyopathy - placed on Coumadin at that time  . Hyperlipidemia   . Nonischemic cardiomyopathy ([place]) 03/28/2017  . Obesity   . Prediabetes 05/03/2017    Patient Active Problem List   Diagnosis Date Noted  . Acute gout due to renal impairment involving right foot 05/07/2020  . Hypertensive kidney disease with CKD stage IV ([place]) 01/23/2019  .  Dyspepsia 01/23/2019  . Noncompliance with treatment plan 10/28/2017  . Encounter for monitoring statin therapy 10/28/2017  . Chronic systolic heart failure ([place]) 10/28/2017  . Acute renal failure superimposed on stage 4 chronic kidney disease ([place]) 05/04/2017  . Uncontrolled stage 2 hypertension 05/04/2017  . Decreased creatinine clearance 05/03/2017  . Prediabetes 05/03/2017  . Elevated blood uric acid level 05/03/2017  . Dyslipidemia, goal LDL below 100 05/03/2017  . History of stroke 03/28/2017  . Gout attack 03/28/2017  . CKD (chronic kidney disease) stage 4, GFR 15-29 ml/min ([facility]) 03/28/2017  . Gastroesophageal reflux disease 03/28/2017  . Nonischemic cardiomyopathy ([place]) 03/28/2017  . Class 3 severe obesity due to excess calories with serious comorbidity and body mass index (BMI) of 40.0 to 44.9 in adult ([place]) 03/28/2017  . 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 Pre-nodular edema of the vocal folds 03/21/2015  . Laryngopharyngeal reflux 01/18/2013    No past surgical history on file.  Prior to Admission medications   Medication Sig Start Date End Date Taking? Authorizing Provider  amoxicillin-clavulanate (AUGMENTIN) 875-125 MG tablet Take 1 tablet by mouth 2 (two) times daily for 10 days. 10/18/20 10/28/20 Yes Caydn Justen, [place], MD  allopurinol (ZYLOPRIM) 100 MG tablet Take 1 tablet (100 mg total) by mouth daily. 07/10/20   Emeterio Reeve, DO  amLODipine (NORVASC) 10 MG tablet Take 1 tablet (10 mg total) by mouth daily. 07/10/20   Emeterio Reeve, DO  APPLE CIDER VINEGAR  PO Take 1 capsule by mouth daily as needed.    [provider]  aspirin (ASPIRIN LOW DOSE) 81 MG EC tablet Take 1 tablet (81 mg total) by mouth daily. **PATIENT NEEDS OFFICE VISIT FOR ADDITIONAL REFILLS** 07/23/20   Samuel Bouche, NP  atorvastatin (LIPITOR) 20 MG tablet Take 1 tablet (20 mg total) by mouth daily. 07/10/20   Emeterio Reeve, DO  azithromycin (ZITHROMAX) 250 MG tablet Take 1 a day  for 4 days 10/18/20   Alfred Levins, [place], MD  calcitRIOL (ROCALTROL) 0.25 MCG capsule Take 0.25 mcg by mouth daily. 03/31/20   [provider]  carvedilol (COREG) 25 MG tablet Take 2 tablets (50 mg total) by mouth 2 (two) times daily with a meal. 07/10/20 10/08/20  Emeterio Reeve, DO  doxazosin (CARDURA) 2 MG tablet Take 2 mg by mouth at bedtime. 03/28/20   [provider]  furosemide (LASIX) 40 MG tablet Take 1 tablet by mouth daily. 03/03/20   Samuel Bouche, NP  olmesartan (BENICAR) 40 MG tablet Take 1 tablet (40 mg total) by mouth daily. 07/10/20   Emeterio Reeve, DO  pantoprazole (PROTONIX) 40 MG tablet Take 1 tablet (40 mg total) by mouth daily. 07/10/20   Emeterio Reeve, DO  predniSONE (DELTASONE) 50 MG tablet Take 1 tablet (50 mg total) by mouth daily. 06/20/20   Samuel Bouche, NP  ranitidine (ZANTAC) 300 MG capsule Take 1 capsule by mouth daily as needed. 03/17/18   [provider]    Allergies Albuterol and Tramadol  Family History  Problem Relation Age of Onset  . Stroke Cousin   . Hypertension Mother   . Heart attack Mother        Died age 68  . Hypertension Father     Social History Social History   Tobacco Use  . Smoking status: Never Smoker  . Smokeless tobacco: Never Used  Vaping Use  . Vaping Use: Never used  Substance Use Topics  . Alcohol use: No    Alcohol/week: 0.0 standard drinks  . Drug use: No    Review of Systems  Constitutional: Negative for fever. Eyes: Negative for visual changes. ENT: Negative for sore throat. + congestion Neck: No neck pain  Cardiovascular: + chest pain. Respiratory: Negative for shortness of breath. + cough Gastrointestinal: Negative for abdominal pain, vomiting or diarrhea. Genitourinary: Negative for dysuria. Musculoskeletal: Negative for back pain. Skin: Negative for rash. Neurological: Negative for headaches, weakness or numbness. Psych: No SI or  HI  ____________________________________________   PHYSICAL EXAM:  VITAL SIGNS: ED Triage Vitals  Enc Vitals Group     BP 10/18/20 0402 (!) 152/91     Pulse Rate 10/18/20 0402 (!) 114     Resp 10/18/20 0402 18     Temp 10/18/20 0402 98.3 F (36.8 C)     Temp Source 10/18/20 0402 Oral     SpO2 10/18/20 0402 99 %     Weight --      Height --      Head Circumference --      Peak Flow --      Pain Score 10/18/20 0400 8     Pain Loc --      Pain Edu? --      Excl. in [place]? --     Constitutional: Alert and oriented. Well appearing and in no apparent distress. HEENT:      Head: Normocephalic and atraumatic.         Eyes: Conjunctivae are normal. Sclera is non-icteric.  Mouth/Throat: Mucous membranes are moist.       Neck: Supple with no signs of meningismus. Cardiovascular: Tachycardic with regular rhythm and no murmurs Respiratory: Normal respiratory effort. Lungs are clear to auscultation bilaterally. No wheezes, crackles, or rhonchi.  Gastrointestinal: Soft, non tender, and non distended. Musculoskeletal:  No edema, cyanosis, or erythema of extremities. Neurologic: Normal speech and language. Face is symmetric. Moving all extremities. No gross focal neurologic deficits are appreciated. Skin: Skin is warm, dry and intact. No rash noted. Psychiatric: Mood and affect are normal. Speech and behavior are normal.  ____________________________________________   LABS (all labs ordered are listed, but only abnormal results are displayed)  Labs Reviewed  BASIC METABOLIC PANEL - Abnormal; Notable for the following components:      Result Value   CO2 20 (*)    Glucose, Bld 147 (*)    BUN 52 (*)    Creatinine, Ser 7.71 (*)    GFR, Estimated 9 (*)    All other components within normal limits  CBC - Abnormal; Notable for the following components:   WBC 14.4 (*)    Hemoglobin 12.5 (*)    HCT 37.9 (*)    All other components within normal limits  TROPONIN I (HIGH  SENSITIVITY) - Abnormal; Notable for the following components:   Troponin I (High Sensitivity) 24 (*)    All other components within normal limits  RESP PANEL BY RT-PCR (FLU A&B, COVID) ARPGX2  PROCALCITONIN  TROPONIN I (HIGH SENSITIVITY)   ____________________________________________  EKG  ED ECG REPORT I, Rudene Re, the attending physician, personally viewed and interpreted this ECG.  Sinus tachycardia, rate of 111, left axis deviation, normal intervals, no ST elevations or depressions.  No significant changes when compared to prior from 2019 other than normal sinus rhythm has been replaced by sinus tachycardia. ____________________________________________  RADIOLOGY  I have personally reviewed the images performed during this visit and I agree with the Radiologist's read.   Interpretation by Radiologist:  DG Chest 2 View  Result Date: 10/18/2020 CLINICAL DATA:  Chest pain, on off for several hours. EXAM: CHEST - 2 VIEW COMPARISON:  Chest x-ray 10/26/2016 FINDINGS: The heart size and mediastinal contours are within normal limits. Low lung volumes. Focal airspace opacity within the left lower lung zone. No pulmonary edema. No pleural effusion. No pneumothorax. No acute osseous abnormality. IMPRESSION: Focal left lower lung zone airspace opacity could represent infection or inflammation. Followup PA and lateral chest X-ray is recommended in 3-4 weeks following therapy to ensure resolution and exclude underlying malignancy. Electronically Signed   By: Iven Finn M.D.   On: 10/18/2020 04:56   CT Chest Wo Contrast  Result Date: 10/18/2020 CLINICAL DATA:  Chest pain, shortness of breath. EXAM: CT CHEST WITHOUT CONTRAST TECHNIQUE: Multidetector CT imaging of the chest was performed following the standard protocol without IV contrast. COMPARISON:  Chest x-ray 10/18/2020 FINDINGS: Cardiovascular: Normal heart size. Trace pericardial effusion. The thoracic aorta is normal in  caliber. No atherosclerotic plaque of the thoracic aorta. No coronary artery calcifications. Mediastinum/Nodes: No enlarged mediastinal or axillary lymph nodes. No gross hilar adenopathy, noting limited sensitivity for the detection of hilar adenopathy on this noncontrast study. Thyroid gland, trachea, and esophagus demonstrate no significant findings. Suggestion of a tiny hiatal hernia. Lungs/Pleura: Redemonstration of a round lingular consolidation that measures approximately 5.3 cm. Slight surrounding ground-glass airspace opacity. Left lower lobe subsegmental atelectasis. Incidentally noted azygos fissure. No pulmonary nodule. No pleural effusion. No pneumothorax. Upper Abdomen: Interval increase  in size of a partially visualized 10 cm fluid density lesion arising from the left kidney. Otherwise no acute abnormality. Musculoskeletal: Bilateral gynecomastia. No suspicious lytic or blastic osseous lesions. No acute displaced fracture. Levocurvature of the lower thoracic spine. Mild degenerative changes of the spine. IMPRESSION: 1. Round lingular consolidation that likely represents pneumonia. Differential diagnosis includes pulmonary infarction. Followup PA and lateral chest X-ray is recommended in 3-4 weeks following therapy to ensure resolution and exclude underlying malignancy. 2. Interval increase in size of a partially visualized 10 cm fluid density lesion arising from the left kidney. 3. Suggestion of a tiny hiatal hernia. Electronically Signed   By: Iven Finn M.D.   On: 10/18/2020 05:48     ____________________________________________   PROCEDURES  Procedure(s) performed:yes .1-3 Lead EKG Interpretation Performed by: Rudene Re, MD Authorized by: Rudene Re, MD     Interpretation: non-specific     ECG rate assessment: normal     Rhythm: sinus tachycardia     Ectopy: none     Critical Care performed:  None ____________________________________________   INITIAL  IMPRESSION / ASSESSMENT AND PLAN / ED COURSE  35 y.o. male with history of CKD stage 4, CVA, CHF w/ EF 30-35% (echo from 2019), prediabetes, obesity who presents for evaluation of sudden onset of pleuritic CP in the setting of a productive cough x 3 weeks and some congestion.  Review of epic shows that patient had a Covid test 2 days ago which was negative.  No prior history of PE or DVT.  He is not in any respiratory distress with normal work of breathing normal sats, lungs are clear to auscultation.  Patient looks euvolemic.  He is tachycardic with a pulse of 114.  EKG showing no signs of ischemia or dysrhythmias  Ddx PNA, PE, pericarditis, myocarditis, ACS, CHF, Covid, Influenza.  Chest x-ray visualized by me showing a opacity in the left lower lobe, confirmed by radiology.  Possibly pneumonia versus lung infarct in the setting of PE.  Initial troponin is borderline at 24.  We will get a second troponin.  Patient does have an elevated white count of 14.4 which could be due to pneumonia.  We will check a procalcitonin.  Will give IV rocephin and azithromycin for CAP. Covid and Flu pending. Will give tylenol for pain. Will get VQ scan to rule out PE. Will get CT chest w/o contrast to eval for pericardial effusion and to better visualize the abnormality seen on CT. Kidney function is at baseline.  Potassium is normal at 3.8.  Old medical records reviewed including patient's last echo from 2019.  Patient placed on telemetry for close monitoring of his cardiorespiratory status.     _________________________ 7:02 AM on 10/18/2020 -----------------------------------------  CT visualized by me show an infiltrate on the left lower lobe.  According to radiologist this could be pneumonia or lung infarct.  Patient has been treated for pneumonia.  Covid and flu are pending.  VQ scan pending.  Care transferred to Dr. Hulan Saas  _____________________________________________ Please note:  Patient was  evaluated in Emergency Department today for the symptoms described in the history of present illness. Patient was evaluated in the context of the global COVID-19 pandemic, which necessitated consideration that the patient might be at risk for infection with the SARS-CoV-2 virus that causes COVID-19. Institutional protocols and algorithms that pertain to the evaluation of patients at risk for COVID-19 are in a state of rapid change based on information released by regulatory bodies including the  CDC and federal and [employer]. These policies and algorithms were followed during the patient's care in the ED.  Some ED evaluations and interventions may be delayed as a result of limited staffing during the pandemic.   [place] Controlled Substance Database was reviewed by me. ____________________________________________   FINAL CLINICAL IMPRESSION(S) / ED DIAGNOSES   Final diagnoses:  Community acquired pneumonia of left lower lobe of lung      NEW MEDICATIONS STARTED DURING THIS VISIT:  ED Discharge Orders         Ordered    amoxicillin-clavulanate (AUGMENTIN) 875-125 MG tablet  2 times daily        10/18/20 0630    azithromycin (ZITHROMAX) 250 MG tablet  Status:  Discontinued        10/18/20 0630    azithromycin (ZITHROMAX) 250 MG tablet        10/18/20 [id number]           Note:  This document was prepared using Dragon voice recognition software and may include unintentional dictation errors.    Rudene Re, MD 10/18/20 [phone]

## 2020-10-18 NOTE — ED Notes (Signed)
Pt now resting with eyes closed;RR even and unlabored on RA-no acute distress.  Cardiac and pulse ox maintained.  Influenza and covid results  Pending and pt awaits Nuc Med V/Q scan at this time.

## 2020-10-18 NOTE — ED Notes (Signed)
Patient transported to X-ray 

## 2020-10-18 NOTE — ED Notes (Signed)
Pt has returned from [place] via stretcher -- continues to report L side cp rated 8/10-- cardiac monitoring and pulse ox monitoring resumed.

## 2020-10-18 NOTE — ED Triage Notes (Signed)
Reports chest pain off/on for several hours.

## 2020-10-18 NOTE — ED Notes (Signed)
Patient transported to CT 

## 2020-10-18 NOTE — ED Notes (Signed)
Late entry - Pt has returned from xray via stretcher - cardiac and pulse ox monitoring resumed- no acute changes noted.

## 2020-12-26 ENCOUNTER — Other Ambulatory Visit: Payer: Self-pay

## 2020-12-26 DIAGNOSIS — K219 Gastro-esophageal reflux disease without esophagitis: Secondary | ICD-10-CM

## 2020-12-26 DIAGNOSIS — R1013 Epigastric pain: Secondary | ICD-10-CM

## 2020-12-26 MED ORDER — PANTOPRAZOLE SODIUM 40 MG PO TBEC: 40.0000 mg | DELAYED_RELEASE_TABLET | Freq: Every day | ORAL | 0 refills | Status: DC

## 2021-02-15 ENCOUNTER — Other Ambulatory Visit: Payer: Self-pay

## 2021-02-15 DIAGNOSIS — N184 Chronic kidney disease, stage 4 (severe): Secondary | ICD-10-CM

## 2021-02-23 ENCOUNTER — Other Ambulatory Visit: Payer: Self-pay

## 2021-02-23 DIAGNOSIS — R1013 Epigastric pain: Secondary | ICD-10-CM

## 2021-02-23 DIAGNOSIS — K219 Gastro-esophageal reflux disease without esophagitis: Secondary | ICD-10-CM

## 2021-02-23 MED ORDER — PANTOPRAZOLE SODIUM 40 MG PO TBEC: 40.0000 mg | DELAYED_RELEASE_TABLET | Freq: Every day | ORAL | 0 refills | Status: DC

## 2021-03-12 ENCOUNTER — Encounter: Payer: Self-pay | Admitting: Vascular Surgery

## 2021-03-12 ENCOUNTER — Ambulatory Visit (HOSPITAL_COMMUNITY): Payer: Self-pay

## 2021-04-10 ENCOUNTER — Other Ambulatory Visit: Payer: Self-pay

## 2021-04-10 ENCOUNTER — Ambulatory Visit (INDEPENDENT_AMBULATORY_CARE_PROVIDER_SITE_OTHER)
Admission: RE | Admit: 2021-04-10 | Discharge: 2021-04-10 | Disposition: A | Discharge status: Home / Self Care | Payer: BC Managed Care – PPO | Source: Ambulatory Visit | Attending: Vascular Surgery | Admitting: Vascular Surgery

## 2021-04-10 ENCOUNTER — Ambulatory Visit (HOSPITAL_COMMUNITY)
Admission: RE | Admit: 2021-04-10 | Discharge: 2021-04-10 | Disposition: A | Discharge status: Home / Self Care | Payer: BC Managed Care – PPO | Source: Ambulatory Visit | Attending: Vascular Surgery | Admitting: Vascular Surgery

## 2021-04-10 ENCOUNTER — Ambulatory Visit (INDEPENDENT_AMBULATORY_CARE_PROVIDER_SITE_OTHER): Payer: BC Managed Care – PPO | Admitting: Vascular Surgery

## 2021-04-10 ENCOUNTER — Encounter: Payer: Self-pay | Admitting: Vascular Surgery

## 2021-04-10 VITALS — BP 181/125 | HR 70 | Temp 98.5°F | Resp 20 | Ht 71.0 in | Wt 302.0 lb

## 2021-04-10 DIAGNOSIS — N184 Chronic kidney disease, stage 4 (severe): Secondary | ICD-10-CM

## 2021-04-10 NOTE — Progress Notes (Signed)
Patient ID: Zachary Lynch, male   DOB: 10-17-1985, 35 y.o.   MRN: [id number]  Reason for Consult: New Patient (Initial Visit)   Referred by Samuel Bouche, NP  Subjective:     HPI:  Zachary Lynch is a 35 y.o. male history of chronic kidney disease.  He did have a history of stroke as well.  He has never been on dialysis or had dialysis access in the past.  Denies any history of stroke chest, breast or upper extremity surgery.  He did have an external pacemaker after his stroke.  He does not take any blood thinners.  He does not take blood thinners.  Past Medical History:  Diagnosis Date   Arthritis    CKD stage 4 secondary to hypertension ([place]) 03/28/2017   Elevated blood uric acid level    Essential hypertension    GERD (gastroesophageal reflux disease)    Gout    History of cardiomyopathy    LVEF 15% in 2010 - evaluated by [facility] and Dr. Caryl Comes   History of stroke 2010   Right MCA distribution infarct, felt to be possibly embolic in the setting of cardiomyopathy - placed on Coumadin at that time   Hyperlipidemia    Nonischemic cardiomyopathy ([place]) 03/28/2017   Obesity    Prediabetes 05/03/2017   Family History  Problem Relation Age of Onset   Stroke Cousin    Hypertension Mother    Heart attack Mother        Died age 68   Hypertension Father    History reviewed. No pertinent surgical history.  Short Social History:  Social History   Tobacco Use   Smoking status: Never   Smokeless tobacco: Never  Substance Use Topics   Alcohol use: No    Alcohol/week: 0.0 standard drinks    Allergies  Allergen Reactions   Albuterol Other (See Comments)    Reaction is unknown. Allergy was entered post Stroke "caused him to have a stroke"     Tramadol Nausea Only    Made patient disoriented    Current Outpatient Medications  Medication Sig Dispense Refill   allopurinol (ZYLOPRIM) 100 MG tablet Take 1 tablet (100 mg total) by mouth daily. 90 tablet 1   amLODipine (NORVASC) 10 MG  tablet Take 1 tablet (10 mg total) by mouth daily. 90 tablet 1   APPLE CIDER VINEGAR PO Take 1 capsule by mouth daily as needed.     aspirin (ASPIRIN LOW DOSE) 81 MG EC tablet Take 1 tablet (81 mg total) by mouth daily. **PATIENT NEEDS OFFICE VISIT FOR ADDITIONAL REFILLS** 30 tablet 0   atorvastatin (LIPITOR) 20 MG tablet Take 1 tablet (20 mg total) by mouth daily. 90 tablet 1   calcitRIOL (ROCALTROL) 0.25 MCG capsule Take 0.25 mcg by mouth daily.     doxazosin (CARDURA) 2 MG tablet Take 2 mg by mouth at bedtime.     furosemide (LASIX) 40 MG tablet Take 1 tablet by mouth daily. 90 tablet 1   olmesartan (BENICAR) 40 MG tablet Take 1 tablet (40 mg total) by mouth daily. 90 tablet 1   pantoprazole (PROTONIX) 40 MG tablet Take 1 tablet (40 mg total) by mouth daily. 15 tablet 0   predniSONE (DELTASONE) 50 MG tablet Take 1 tablet (50 mg total) by mouth daily. 5 tablet 0   ranitidine (ZANTAC) 300 MG capsule Take 1 capsule by mouth daily as needed.     carvedilol (COREG) 25 MG tablet Take 2 tablets (50 mg  total) by mouth 2 (two) times daily with a meal. 360 tablet 1   No current facility-administered medications for this visit.    Review of Systems  Constitutional:  Constitutional negative. HENT: HENT negative.  Eyes: Eyes negative.  Respiratory: Respiratory negative.  Cardiovascular: Cardiovascular negative.  GI: Gastrointestinal negative.  Skin: Skin negative.  Neurological: Neurological negative. Hematologic: Hematologic/lymphatic negative.  Psychiatric: Psychiatric negative.       Objective:  Objective   Vitals:   04/10/21 1426  BP: (!) 181/125  Pulse: 70  Resp: 20  Temp: 98.5 F (36.9 C)  SpO2: 98%  Weight: (!) 302 lb (137 kg)  Height: '5\' 11"'$  (1.803 m)   Body mass index is 42.12 kg/m.  Physical Exam HENT:     Head: Normocephalic.     Nose:     Comments: Wearing a mask Eyes:     Pupils: Pupils are equal, round, and reactive to light.  Cardiovascular:     Rate and  Rhythm: Normal rate.     Pulses:          Radial pulses are 1+ on the right side and 1+ on the left side.  Pulmonary:     Effort: Pulmonary effort is normal.  Abdominal:     General: Abdomen is flat.     Palpations: Abdomen is soft.  Musculoskeletal:        General: Normal range of motion.     Cervical back: Normal range of motion and neck supple.  Skin:    General: Skin is warm and dry.     Capillary Refill: Capillary refill takes less than 2 seconds.  Neurological:     General: No focal deficit present.     Mental Status: He is alert.  Psychiatric:        Mood and Affect: Mood normal.    Data:  Study Result         Right Pre-Dialysis Findings:  +-----------------------+----------+--------------------+---------+--------  +  Location               PSV (cm/s)Intralum. Diam. (cm)Waveform  Comments  +-----------------------+----------+--------------------+---------+--------  +  Brachial Antecub. fossa52        0.55                triphasic            +-----------------------+----------+--------------------+---------+--------  +  Radial Art at Wrist    40        0.25                triphasic            +-----------------------+----------+--------------------+---------+--------  +  Ulnar Art at Wrist     52        0.25                triphasic            +-----------------------+----------+--------------------+---------+--------  +          Left Pre-Dialysis Findings:  +-----------------------+----------+--------------------+---------+--------  +  Location               PSV (cm/s)Intralum. Diam. (cm)Waveform  Comments  +-----------------------+----------+--------------------+---------+--------  +  Brachial Antecub. fossa60        0.46                triphasic            +-----------------------+----------+--------------------+---------+--------  +  Radial Art at Wrist    47        0.21  triphasic             +-----------------------+----------+--------------------+---------+--------  +  Ulnar Art at Wrist     23        0.25                triphasic            +-----------------------+----------+--------------------+---------+--------  +    +-----------------+-------------+----------+--------+  Right Cephalic   Diameter (cm)Depth (cm)Findings  +-----------------+-------------+----------+--------+  Shoulder             0.13                         +-----------------+-------------+----------+--------+  Mid upper arm        0.07                         +-----------------+-------------+----------+--------+  Dist upper arm       0.17                         +-----------------+-------------+----------+--------+  Antecubital fossa    0.23                         +-----------------+-------------+----------+--------+  Prox forearm         0.19                         +-----------------+-------------+----------+--------+  Mid forearm          0.15                         +-----------------+-------------+----------+--------+  Wrist                0.13                         +-----------------+-------------+----------+--------+   +-----------------+-------------+----------+--------+  Right Basilic    Diameter (cm)Depth (cm)Findings  +-----------------+-------------+----------+--------+  Prox upper arm       0.28                         +-----------------+-------------+----------+--------+  Mid upper arm        0.35                         +-----------------+-------------+----------+--------+  Dist upper arm       0.23                         +-----------------+-------------+----------+--------+  Antecubital fossa    0.29                         +-----------------+-------------+----------+--------+   +-----------------+-------------+----------+--------+  Left Cephalic    Diameter (cm)Depth (cm)Findings   +-----------------+-------------+----------+--------+  Shoulder             0.10                         +-----------------+-------------+----------+--------+  Mid upper arm        0.13                         +-----------------+-------------+----------+--------+  Dist upper arm       0.17                         +-----------------+-------------+----------+--------+  Antecubital fossa    0.29                         +-----------------+-------------+----------+--------+  Prox forearm         0.18                         +-----------------+-------------+----------+--------+  Mid forearm          0.19                         +-----------------+-------------+----------+--------+  Wrist                0.28                         +-----------------+-------------+----------+--------+   +-----------------+-------------+----------+--------+  Left Basilic     Diameter (cm)Depth (cm)Findings  +-----------------+-------------+----------+--------+  Prox upper arm       0.36                         +-----------------+-------------+----------+--------+  Mid upper arm        0.34                         +-----------------+-------------+----------+--------+  Dist upper arm       0.34                         +-----------------+-------------+----------+--------+  Antecubital fossa    0.27                         +-----------------+-------------+----------+--------+   *See table(s) above for measurements and observations.          Assessment/Plan:     35 year old male with chronic kidney disease.  He is sent for evaluation of permanent dialysis access.  He is left-hand dominant.  He appeared on initial vein mapping to have suitable basilic vein for fistula creation but on my exam with ultrasound I cannot identify this large vein as it appears quite diminutive and tortuous.  I have recommended likely AV grafting in his right upper extremity  when he is closer to needing dialysis.  Plan for nephrology to call and schedule without needing further visit prior to right upper extremity access procedure.     Waynetta Sandy MD [facility]

## 2021-10-17 ENCOUNTER — Telehealth: Payer: BC Managed Care – PPO | Admitting: Family

## 2021-10-17 DIAGNOSIS — I5022 Chronic systolic (congestive) heart failure: Secondary | ICD-10-CM

## 2021-10-17 DIAGNOSIS — M109 Gout, unspecified: Secondary | ICD-10-CM

## 2021-10-17 MED ORDER — PREDNISONE 20 MG PO TABS: 40.0000 mg | ORAL_TABLET | Freq: Every day | ORAL | 0 refills | Status: AC

## 2021-10-17 MED ORDER — CARVEDILOL 25 MG PO TABS: 50.0000 mg | ORAL_TABLET | Freq: Two times a day (BID) | ORAL | 0 refills | Status: DC

## 2021-10-17 NOTE — Progress Notes (Signed)
E-Visit for Gout Symptoms  We are sorry that you are not feeling well. We are here to help!  Based on what you shared with me it looks like you have a flare of your gout.  Gout is a form of arthritis. It can cause pain and swelling in the joints. At first, it tends to affect only 1 joint - most frequently the big toe. It happens in people who have too much uric acid in the blood. Uric acid is a chemical that is produced when the body breaks down certain foods. Uric acid can form sharp needle-like crystals that build up in the joints and cause pain. Uric acid crystals can also form inside the tubes that carry urine from the kidneys to the bladder. These crystals can turn into "kidney stones" that can cause pain and problems with the flow of urine. People with gout get sudden "flares" or attacks of severe pain, most often the big toe, ankle, or knee. Often the joint also turns red and swells. Usually, only 1 joint is affected, but some people have pain in more than 1 joint. Gout flares tend to happen more often during the night.  The pain from gout can be extreme. The pain and swelling are worst at the beginning of a gout flare. The symptoms then get better within a few days to weeks. It is not clear how the body "turns off" a gout flare.  Do not start any NEW preventative medicine until the gout has cleared completely. However, If you are already on Probenecid or Allopurinol for CHRONIC gout, you may continue taking this during an active flare up  I have prescribed Prednisone 40 mg daily for 7.  I have refilled your Coreg for one month. All other refills need to come from your PCP.   HOME CARE Losing weight can help relieve gout. It's not clear that following a specific diet plan will help with gout symptoms but eating a balanced diet can help improve your overall health. It can also help you lose weight, if you are overweight. In general, a healthy diet includes plenty of fruits, vegetables, whole  grains, and low-fat dairy products (labelled low fat, skim, 2%). Avoid sugar sweetened drinks (including sodas, tea, juice and juice blends, coffee drinks and sports drinks) Limit alcohol to 1-2 drinks of beer, spirits or wine daily these can make gout flares worse. Some people with gout also have other health problems, such as heart disease, high blood pressure, kidney disease, or obesity. If you have any of these issues, it's important to work with your doctor to manage them. This can help improve your overall health and might also help with your gout.  GET HELP RIGHT AWAY IF: Your symptoms persist after you have completed your treatment plan You develop severe diarrhea You develop abnormal sensations  You develop vomiting,   You develop weakness  You develop abdominal pain  FOLLOW UP WITH YOUR PRIMARY PROVIDER IF: If your symptoms do not improve within 10 days  MAKE SURE YOU  Understand these instructions. Will watch your condition. Will get help right away if you are not doing well or get worse.  Thank you for choosing an e-visit.  Your e-visit answers were reviewed by a board certified advanced clinical practitioner to complete your personal care plan. Depending upon the condition, your plan could have included both over the counter or prescription medications.  Please review your pharmacy choice. Make sure the pharmacy is open so you can pick  up prescription now. If there is a problem, you may contact your provider through [employer] and have the prescription routed to another pharmacy.  Your safety is important to [place]. If you have drug allergies check your prescription carefully.   For the next 24 hours you can use MyChart to ask questions about today's visit, request a non-urgent call back, or ask for a work or school excuse. You will get an email in the next two days asking about your experience. I hope that your e-visit has been valuable and will speed your  recovery.  Approximately 5 minutes was spent documenting and reviewing patient's chart.

## 2021-11-15 NOTE — Progress Notes (Signed)
HPI: Zachary Lynch is a 36 y.o. male who  has a past medical history of Arthritis, CKD stage 4 secondary to hypertension ([place]) (03/28/2017), Elevated blood uric acid level, Essential hypertension, GERD (gastroesophageal reflux disease), Gout, History of cardiomyopathy, History of stroke (2010), Hyperlipidemia, Nonischemic cardiomyopathy ([place]) (03/28/2017), Obesity, and Prediabetes (05/03/2017).  he presents to [facility] today, 11/16/21,  for chief complaint of: Annual physical exam  Dentist: UTD, q 6 months Eye exam: UTD Exercise: treadmill, planning to get a gym membership Diet: limits sodium intake COVID vaccine: got his 1st vaccine this month  Concerns: Due for follow up with nephrology  Past medical, surgical, social and family history reviewed:  Patient Active Problem List   Diagnosis Date Noted   Acute gout due to renal impairment involving right foot 05/07/2020   Hypertensive kidney disease with CKD stage IV ([place]) 01/23/2019   Dyspepsia 01/23/2019   Noncompliance with treatment plan 10/28/2017   Encounter for monitoring statin therapy 44/11/4740   Chronic systolic heart failure ([place]) 10/28/2017   Acute renal failure superimposed on stage 4 chronic kidney disease ([place]) 05/04/2017   Uncontrolled stage 2 hypertension 05/04/2017   Decreased creatinine clearance 05/03/2017   Prediabetes 05/03/2017   Elevated blood uric acid level 05/03/2017   Dyslipidemia, goal LDL below 100 05/03/2017   History of stroke 03/28/2017   Gout attack 03/28/2017   CKD (chronic kidney disease) stage 4, GFR 15-29 ml/min (HCC) 03/28/2017   Gastroesophageal reflux disease 03/28/2017   Nonischemic cardiomyopathy ([place]) 03/28/2017   Class 3 severe obesity due to excess calories with serious comorbidity and body mass index (BMI) of 40.0 to 44.9 in adult ([place]) 03/28/2017   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 No pertinent surgical history.  Social History   Tobacco Use   Smoking status: Never   Smokeless tobacco: Never  Substance Use Topics   Alcohol use: No    Alcohol/week: 0.0 standard drinks    Family History  Problem Relation Age of Onset   Stroke Cousin    Hypertension Mother    Heart attack Mother        Died age 62   Hypertension Father      Current medication list and allergy/intolerance information reviewed:    Current Outpatient Medications  Medication Sig Dispense Refill   famotidine (PEPCID) 40 MG tablet Take 1 tablet (40 mg total) by mouth daily. 90 tablet 1   allopurinol (ZYLOPRIM) 100 MG tablet Take 1 tablet (100 mg total) by mouth daily. 90 tablet 1   amLODipine (NORVASC) 10 MG tablet Take 1 tablet (10 mg total) by mouth daily. 90 tablet 1   APPLE CIDER VINEGAR PO Take 1 capsule by mouth daily as needed.     aspirin (ASPIRIN LOW DOSE) 81 MG EC tablet Take 1 tablet (81 mg total) by mouth daily. **PATIENT NEEDS OFFICE VISIT FOR ADDITIONAL REFILLS** 30 tablet 0   atorvastatin (LIPITOR) 20 MG tablet Take 1 tablet (20 mg total) by mouth daily. 90 tablet 1   calcitRIOL (ROCALTROL) 0.25 MCG capsule Take 1 capsule (0.25 mcg total) by mouth daily. 90 capsule 1   carvedilol (COREG) 25 MG tablet Take 2 tablets (50 mg total) by mouth 2 (two) times daily with a meal. 360 tablet 1   doxazosin (CARDURA) 2 MG tablet Take 1 tablet (2 mg total) by mouth at bedtime. 90 tablet 1  pantoprazole (PROTONIX) 40 MG tablet Take 1 tablet (40 mg total) by mouth daily. 15 tablet 0   No current facility-administered medications for this visit.    Allergies  Allergen Reactions   Albuterol Other (See Comments)    Reaction is unknown. Allergy was entered post Stroke "caused him to have a stroke"     Tramadol Nausea Only    Made patient disoriented      Review of Systems: Constitutional:  No  fever, no chills, No recent illness,  No unintentional weight changes. No significant fatigue.  HEENT: No  headache, no vision change, no hearing change, No sore throat, No  sinus pressure Cardiac: No  chest pain, No  pressure, No palpitations, No  Orthopnea Respiratory:  No  shortness of breath. No  Cough Gastrointestinal: No  abdominal pain, No  nausea, No  vomiting,  No  blood in stool, No  diarrhea, No  constipation  Musculoskeletal: No new myalgia/arthralgia Skin: No  Rash, No other wounds/concerning lesions Genitourinary: No  incontinence, No  abnormal genital bleeding, No abnormal genital discharge Hem/Onc: No  easy bruising/bleeding, No  abnormal lymph node Endocrine: No cold intolerance,  No heat intolerance. No polyuria/polydipsia/polyphagia  Neurologic: No  weakness, No  dizziness, No  slurred speech/focal weakness/facial droop Psychiatric: No  concerns with depression, No  concerns with anxiety, No sleep problems, No mood problems  Exam:  BP (!) 143/93 (BP Location: Right Arm, Patient Position: Sitting, Cuff Size: Large)    Pulse 77    Resp 20    Ht 5\' 11"  (1.803 m)    Wt (!) 304 lb 8 oz (138.1 kg)    SpO2 100%    BMI 42.47 kg/m  Constitutional: VS see above. General Appearance: alert, well-developed, well-nourished, NAD Eyes: Normal lids and conjunctive, non-icteric sclera Ears, Nose, Mouth, Throat: MMM, Normal external inspection ears/nares/mouth/lips/gums. TM normal bilaterally.  Neck: No masses, trachea midline. No thyroid enlargement. No tenderness/mass appreciated. No lymphadenopathy Respiratory: Normal respiratory effort. no wheeze, no rhonchi, no rales Cardiovascular: S1/S2 normal, no murmur, no rub/gallop auscultated. RRR. No lower extremity edema. Pedal pulse II/IV bilaterally PT. No carotid bruit or JVD. No abdominal aortic bruit. Gastrointestinal: Nontender, no masses. No hepatomegaly, no splenomegaly. No hernia appreciated. Bowel sounds normal. Rectal exam deferred.  Musculoskeletal: Gait normal. No  clubbing/cyanosis of digits.  Neurological: Normal balance/coordination. No tremor. No cranial nerve deficit on limited exam. Motor and sensation intact and symmetric. Cerebellar reflexes intact.  Skin: warm, dry, intact. No rash/ulcer. No concerning nevi or subq nodules on limited exam.   Psychiatric: Normal judgment/insight. Normal mood and affect. Oriented x3.    ASSESSMENT/PLAN:   1. CKD stage 5 secondary to hypertension ([place]) Checking renal function  2. Nonischemic cardiomyopathy ([place]) 3. Chronic systolic heart failure (HCC) Continue Coreg 50mg  twice daily.  - carvedilol (COREG) 25 MG tablet; Take 2 tablets (50 mg total) by mouth 2 (two) times daily with a meal.  Dispense: 360 tablet; Refill: 1  4. Uncontrolled stage 2 hypertension BP a bit above goal today but patient just took meds 20 minutes before appointment. For now continue Amlodipine. Refilling ASA.  - amLODipine (NORVASC) 10 MG tablet; Take 1 tablet (10 mg total) by mouth daily.  Dispense: 90 tablet; Refill: 1  5. Encounter for monitoring statin therapy 6. Dyslipidemia, goal LDL below 100 Checking lipids and liver function today. Continue Lipitor.  - atorvastatin (LIPITOR) 20 MG tablet; Take 1 tablet (20 mg total) by mouth daily.  Dispense: 90 tablet; Refill:  1  7. Prediabetes Checking A1c.  - Hemoglobin A1c  8. Gastroesophageal reflux disease, unspecified whether esophagitis present Continue Protonix.  - pantoprazole (PROTONIX) 40 MG tablet; Take 1 tablet (40 mg total) by mouth daily.  Dispense: 15 tablet; Refill: 0  9. 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   10. Annual physical exam Checking  labs as below. UTD on preventative care. Wellness information provided via AVS.  - Lipid panel - COMPLETE METABOLIC PANEL WITH GFR - CBC with Differential/Platelet  11. Acute gout due to renal impairment involving left elbow Continue Allopurinol.  -  allopurinol (ZYLOPRIM) 100 MG tablet; Take 1 tablet (100 mg total) by mouth daily.  Dispense: 90 tablet; Refill: 1  12. History of stroke Continue ASA.  - aspirin (ASPIRIN LOW DOSE) 81 MG EC tablet; Take 1 tablet (81 mg total) by mouth daily. **PATIENT NEEDS OFFICE VISIT FOR ADDITIONAL REFILLS**  Dispense: 30 tablet; Refill: 0  Orders Placed This Encounter  Procedures   TSH   Lipid panel   COMPLETE METABOLIC PANEL WITH GFR   CBC with Differential/Platelet   Hemoglobin A1c    Meds ordered this encounter  Medications   allopurinol (ZYLOPRIM) 100 MG tablet    Sig: Take 1 tablet (100 mg total) by mouth daily.    Dispense:  90 tablet    Refill:  1    Order Specific Question:   Supervising Provider    Answer:   MATTHEWS, CODY [[id number]]   amLODipine (NORVASC) 10 MG tablet    Sig: Take 1 tablet (10 mg total) by mouth daily.    Dispense:  90 tablet    Refill:  1    Order Specific Question:   Supervising Provider    Answer:   MATTHEWS, CODY [[id number]]   aspirin (ASPIRIN LOW DOSE) 81 MG EC tablet    Sig: Take 1 tablet (81 mg total) by mouth daily. **PATIENT NEEDS OFFICE VISIT FOR ADDITIONAL REFILLS**    Dispense:  30 tablet    Refill:  0    Order Specific Question:   Supervising Provider    Answer:   MATTHEWS, CODY [[id number]]   atorvastatin (LIPITOR) 20 MG tablet    Sig: Take 1 tablet (20 mg total) by mouth daily.    Dispense:  90 tablet    Refill:  1    Order Specific Question:   Supervising Provider    Answer:   MATTHEWS, CODY [[id number]]   calcitRIOL (ROCALTROL) 0.25 MCG capsule    Sig: Take 1 capsule (0.25 mcg total) by mouth daily.    Dispense:  90 capsule    Refill:  1    Order Specific Question:   Supervising Provider    Answer:   MATTHEWS, CODY [[id number]]   carvedilol (COREG) 25 MG tablet    Sig: Take 2 tablets (50 mg total) by mouth 2 (two) times daily with a meal.    Dispense:  360 tablet    Refill:  1    Order Specific Question:   Supervising Provider    Answer:   MATTHEWS, CODY  [[id number]]   doxazosin (CARDURA) 2 MG tablet    Sig: Take 1 tablet (2 mg total) by mouth at bedtime.    Dispense:  90 tablet    Refill:  1    Order Specific Question:   Supervising Provider    Answer:   MATTHEWS, CODY [[id number]]   pantoprazole (PROTONIX) 40 MG tablet    Sig:  Take 1 tablet (40 mg total) by mouth daily.    Dispense:  15 tablet    Refill:  0    Order Specific Question:   Supervising Provider    Answer:   MATTHEWS, CODY [[id number]]   famotidine (PEPCID) 40 MG tablet    Sig: Take 1 tablet (40 mg total) by mouth daily.    Dispense:  90 tablet    Refill:  1    Order Specific Question:   Supervising Provider    Answer:   Estell Harpin    Patient Instructions  Preventive Care 49-5 Years Old, Male Preventive care refers to lifestyle choices and visits with your health care provider that can promote health and wellness. Preventive care visits are also called wellness exams. What can I expect for my preventive care visit? Counseling During your preventive care visit, your health care provider may ask about your: Medical history, including: Past medical problems. Family medical history. Current health, including: Emotional well-being. Home life and relationship well-being. Sexual activity. Lifestyle, including: Alcohol, nicotine or tobacco, and drug use. Access to firearms. Diet, exercise, and sleep habits. Safety issues such as seatbelt and bike helmet use. Sunscreen use. Work and work Statistician. Physical exam Your health care provider may check your: Height and weight. These may be used to calculate your BMI (body mass index). BMI is a measurement that tells if you are at a healthy weight. Waist circumference. This measures the distance around your waistline. This measurement also tells if you are at a healthy weight and may help predict your risk of certain diseases, such as type 2 diabetes and high blood pressure. Heart rate and blood pressure. Body  temperature. Skin for abnormal spots. What immunizations do I need? Vaccines are usually given at various ages, according to a schedule. Your health care provider will recommend vaccines for you based on your age, medical history, and lifestyle or other factors, such as travel or where you work. What tests do I need? Screening Your health care provider may recommend screening tests for certain conditions. This may include: Lipid and cholesterol levels. Diabetes screening. This is done by checking your blood sugar (glucose) after you have not eaten for a while (fasting). Hepatitis B test. Hepatitis C test. HIV (human immunodeficiency virus) test. STI (sexually transmitted infection) testing, if you are at risk. Talk with your health care provider about your test results, treatment options, and if necessary, the need for more tests. Follow these instructions at home: Eating and drinking  Eat a healthy diet that includes fresh fruits and vegetables, whole grains, lean protein, and low-fat dairy products. Drink enough fluid to keep your urine pale yellow. Take vitamin and mineral supplements as recommended by your health care provider. Do not drink alcohol if your health care provider tells you not to drink. If you drink alcohol: Limit how much you have to 0-2 drinks a day. Know how much alcohol is in your drink. In the [place] one drink equals one 12 oz bottle of beer (355 mL), one 5 oz glass of wine (148 mL), or one 1 oz glass of hard liquor (44 mL). Lifestyle Brush your teeth every morning and night with fluoride toothpaste. Floss one time each day. Exercise for at least 30 minutes 5 or more days each week. Do not use any products that contain nicotine or tobacco. These products include cigarettes, chewing tobacco, and vaping devices, such as e-cigarettes. If you need help quitting, ask your health care provider. Do not  use drugs. If you are sexually active, practice safe sex. Use a condom  or other form of protection to prevent STIs. Find healthy ways to manage stress, such as: Meditation, yoga, or listening to music. Journaling. Talking to a trusted person. Spending time with friends and family. Minimize exposure to UV radiation to reduce your risk of skin cancer. Safety Always wear your seat belt while driving or riding in a vehicle. Do not drive: If you have been drinking alcohol. Do not ride with someone who has been drinking. If you have been using any mind-altering substances or drugs. While texting. When you are tired or distracted. Wear a helmet and other protective equipment during sports activities. If you have firearms in your house, make sure you follow all gun safety procedures. Seek help if you have been physically or sexually abused. What's next? Go to your health care provider once a year for an annual wellness visit. Ask your health care provider how often you should have your eyes and teeth checked. Stay up to date on all vaccines. This information is not intended to replace advice given to you by your health care provider. Make sure you discuss any questions you have with your health care provider. Document Revised: 03/04/2021 Document Reviewed: 03/04/2021 Elsevier Patient Education  [place].   Follow-up plan: Return in about 6 months (around 05/16/2022) for chronic disease follow up.  Clearnce Sorrel, DNP, APRN, FNP-BC [facility]

## 2021-11-16 ENCOUNTER — Ambulatory Visit (INDEPENDENT_AMBULATORY_CARE_PROVIDER_SITE_OTHER): Payer: BC Managed Care – PPO | Admitting: Medical-Surgical

## 2021-11-16 ENCOUNTER — Other Ambulatory Visit: Payer: Self-pay

## 2021-11-16 ENCOUNTER — Encounter: Payer: Self-pay | Admitting: Medical-Surgical

## 2021-11-16 VITALS — BP 143/93 | HR 77 | Resp 20 | Ht 71.0 in | Wt 304.5 lb

## 2021-11-16 DIAGNOSIS — R1013 Epigastric pain: Secondary | ICD-10-CM

## 2021-11-16 DIAGNOSIS — E785 Hyperlipidemia, unspecified: Secondary | ICD-10-CM

## 2021-11-16 DIAGNOSIS — I428 Other cardiomyopathies: Secondary | ICD-10-CM | POA: Diagnosis not present

## 2021-11-16 DIAGNOSIS — Z5181 Encounter for therapeutic drug level monitoring: Secondary | ICD-10-CM

## 2021-11-16 DIAGNOSIS — I5022 Chronic systolic (congestive) heart failure: Secondary | ICD-10-CM

## 2021-11-16 DIAGNOSIS — N185 Chronic kidney disease, stage 5: Secondary | ICD-10-CM

## 2021-11-16 DIAGNOSIS — I120 Hypertensive chronic kidney disease with stage 5 chronic kidney disease or end stage renal disease: Secondary | ICD-10-CM

## 2021-11-16 DIAGNOSIS — Z8673 Personal history of transient ischemic attack (TIA), and cerebral infarction without residual deficits: Secondary | ICD-10-CM

## 2021-11-16 DIAGNOSIS — Z79899 Other long term (current) drug therapy: Secondary | ICD-10-CM

## 2021-11-16 DIAGNOSIS — R7303 Prediabetes: Secondary | ICD-10-CM

## 2021-11-16 DIAGNOSIS — I10 Essential (primary) hypertension: Secondary | ICD-10-CM

## 2021-11-16 DIAGNOSIS — Z0000 Encounter for general adult medical examination without abnormal findings: Secondary | ICD-10-CM

## 2021-11-16 DIAGNOSIS — M10322 Gout due to renal impairment, left elbow: Secondary | ICD-10-CM

## 2021-11-16 DIAGNOSIS — K219 Gastro-esophageal reflux disease without esophagitis: Secondary | ICD-10-CM

## 2021-11-16 DIAGNOSIS — M1A3490 Chronic gout due to renal impairment, unspecified hand, without tophus (tophi): Secondary | ICD-10-CM

## 2021-11-16 MED ORDER — PANTOPRAZOLE SODIUM 40 MG PO TBEC: 40.0000 mg | DELAYED_RELEASE_TABLET | Freq: Every day | ORAL | 0 refills | Status: DC

## 2021-11-16 MED ORDER — CARVEDILOL 25 MG PO TABS: 50.0000 mg | ORAL_TABLET | Freq: Two times a day (BID) | ORAL | 1 refills | Status: DC

## 2021-11-16 MED ORDER — ASPIRIN 81 MG PO TBEC: 81.0000 mg | DELAYED_RELEASE_TABLET | Freq: Every day | ORAL | 0 refills | Status: DC

## 2021-11-16 MED ORDER — CALCITRIOL 0.25 MCG PO CAPS: 0.2500 ug | ORAL_CAPSULE | Freq: Every day | ORAL | 1 refills | Status: DC

## 2021-11-16 MED ORDER — ALLOPURINOL 100 MG PO TABS: 100.0000 mg | ORAL_TABLET | Freq: Every day | ORAL | 1 refills | Status: DC

## 2021-11-16 MED ORDER — ATORVASTATIN CALCIUM 20 MG PO TABS: 20.0000 mg | ORAL_TABLET | Freq: Every day | ORAL | 1 refills | Status: DC

## 2021-11-16 MED ORDER — FAMOTIDINE 40 MG PO TABS: 40.0000 mg | ORAL_TABLET | Freq: Every day | ORAL | 1 refills | Status: DC

## 2021-11-16 MED ORDER — DOXAZOSIN MESYLATE 2 MG PO TABS: 2.0000 mg | ORAL_TABLET | Freq: Every day | ORAL | 1 refills | Status: DC

## 2021-11-16 MED ORDER — AMLODIPINE BESYLATE 10 MG PO TABS: 10.0000 mg | ORAL_TABLET | Freq: Every day | ORAL | 1 refills | Status: DC

## 2021-11-16 NOTE — Patient Instructions (Signed)
Preventive Care 21-38 Years Old, Male Preventive care refers to lifestyle choices and visits with your health care provider that can promote health and wellness. Preventive care visits are also called wellness exams. What can I expect for my preventive care visit? Counseling During your preventive care visit, your health care provider may ask about your: Medical history, including: Past medical problems. Family medical history. Current health, including: Emotional well-being. Home life and relationship well-being. Sexual activity. Lifestyle, including: Alcohol, nicotine or tobacco, and drug use. Access to firearms. Diet, exercise, and sleep habits. Safety issues such as seatbelt and bike helmet use. Sunscreen use. Work and work Statistician. Physical exam Your health care provider may check your: Height and weight. These may be used to calculate your BMI (body mass index). BMI is a measurement that tells if you are at a healthy weight. Waist circumference. This measures the distance around your waistline. This measurement also tells if you are at a healthy weight and may help predict your risk of certain diseases, such as type 2 diabetes and high blood pressure. Heart rate and blood pressure. Body temperature. Skin for abnormal spots. What immunizations do I need? Vaccines are usually given at various ages, according to a schedule. Your health care provider will recommend vaccines for you based on your age, medical history, and lifestyle or other factors, such as travel or where you work. What tests do I need? Screening Your health care provider may recommend screening tests for certain conditions. This may include: Lipid and cholesterol levels. Diabetes screening. This is done by checking your blood sugar (glucose) after you have not eaten for a while (fasting). Hepatitis B test. Hepatitis C test. HIV (human immunodeficiency virus) test. STI (sexually transmitted infection)  testing, if you are at risk. Talk with your health care provider about your test results, treatment options, and if necessary, the need for more tests. Follow these instructions at home: Eating and drinking  Eat a healthy diet that includes fresh fruits and vegetables, whole grains, lean protein, and low-fat dairy products. Drink enough fluid to keep your urine pale yellow. Take vitamin and mineral supplements as recommended by your health care provider. Do not drink alcohol if your health care provider tells you not to drink. If you drink alcohol: Limit how much you have to 0-2 drinks a day. Know how much alcohol is in your drink. In the [place] one drink equals one 12 oz bottle of beer (355 mL), one 5 oz glass of wine (148 mL), or one 1 oz glass of hard liquor (44 mL). Lifestyle Brush your teeth every morning and night with fluoride toothpaste. Floss one time each day. Exercise for at least 30 minutes 5 or more days each week. Do not use any products that contain nicotine or tobacco. These products include cigarettes, chewing tobacco, and vaping devices, such as e-cigarettes. If you need help quitting, ask your health care provider. Do not use drugs. If you are sexually active, practice safe sex. Use a condom or other form of protection to prevent STIs. Find healthy ways to manage stress, such as: Meditation, yoga, or listening to music. Journaling. Talking to a trusted person. Spending time with friends and family. Minimize exposure to UV radiation to reduce your risk of skin cancer. Safety Always wear your seat belt while driving or riding in a vehicle. Do not drive: If you have been drinking alcohol. Do not ride with someone who has been drinking. If you have been using any mind-altering substances or  drugs. While texting. When you are tired or distracted. Wear a helmet and other protective equipment during sports activities. If you have firearms in your house, make sure you  follow all gun safety procedures. Seek help if you have been physically or sexually abused. What's next? Go to your health care provider once a year for an annual wellness visit. Ask your health care provider how often you should have your eyes and teeth checked. Stay up to date on all vaccines. This information is not intended to replace advice given to you by your health care provider. Make sure you discuss any questions you have with your health care provider. Document Revised: 03/04/2021 Document Reviewed: 03/04/2021 Elsevier Patient Education  [place].

## 2021-11-17 LAB — CBC WITH DIFFERENTIAL/PLATELET
Absolute Monocytes: 510 cells/uL (ref 200–950)
Basophils Absolute: 36 cells/uL (ref 0–200)
Basophils Relative: 0.4 %
Eosinophils Absolute: 282 cells/uL (ref 15–500)
Eosinophils Relative: 3.1 %
HCT: 42.9 % (ref 38.5–50.0)
Hemoglobin: 13.9 g/dL (ref 13.2–17.1)
Lymphs Abs: 2466 cells/uL (ref 850–3900)
MCH: 30.3 pg (ref 27.0–33.0)
MCHC: 32.4 g/dL (ref 32.0–36.0)
MCV: 93.5 fL (ref 80.0–100.0)
MPV: 10 fL (ref 7.5–12.5)
Monocytes Relative: 5.6 %
Neutro Abs: 5806 cells/uL (ref 1500–7800)
Neutrophils Relative %: 63.8 %
Platelets: 247 10*3/uL (ref 140–400)
RBC: 4.59 10*6/uL (ref 4.20–5.80)
RDW: 14 % (ref 11.0–15.0)
Total Lymphocyte: 27.1 %
WBC: 9.1 10*3/uL (ref 3.8–10.8)

## 2021-11-17 LAB — COMPLETE METABOLIC PANEL WITH GFR
AG Ratio: 1.1 (calc) (ref 1.0–2.5)
ALT: 12 U/L (ref 9–46)
AST: 11 U/L (ref 10–40)
Albumin: 4 g/dL (ref 3.6–5.1)
Alkaline phosphatase (APISO): 81 U/L (ref 36–130)
BUN/Creatinine Ratio: 6 (calc) (ref 6–22)
BUN: 69 mg/dL — ABNORMAL HIGH (ref 7–25)
CO2: 18 mmol/L — ABNORMAL LOW (ref 20–32)
Calcium: 9.5 mg/dL (ref 8.6–10.3)
Chloride: 109 mmol/L (ref 98–110)
Creat: 11.4 mg/dL — ABNORMAL HIGH (ref 0.60–1.26)
Globulin: 3.5 g/dL (calc) (ref 1.9–3.7)
Glucose, Bld: 78 mg/dL (ref 65–99)
Potassium: 5.2 mmol/L (ref 3.5–5.3)
Sodium: 141 mmol/L (ref 135–146)
Total Bilirubin: 0.7 mg/dL (ref 0.2–1.2)
Total Protein: 7.5 g/dL (ref 6.1–8.1)
eGFR: 5 mL/min/{1.73_m2} — ABNORMAL LOW (ref >=60)

## 2021-11-17 LAB — LIPID PANEL
Cholesterol: 121 mg/dL (ref <200)
HDL: 28 mg/dL — ABNORMAL LOW (ref >=40)
LDL Cholesterol (Calc): 79 mg/dL (calc)
Non-HDL Cholesterol (Calc): 93 mg/dL (calc) (ref <130)
Total CHOL/HDL Ratio: 4.3 (calc) (ref <5.0)
Triglycerides: 62 mg/dL (ref <150)

## 2021-11-17 LAB — HEMOGLOBIN A1C
Hgb A1c MFr Bld: 5.4 % of total Hgb (ref <5.7)
Mean Plasma Glucose: 108 mg/dL
eAG (mmol/L): 6 mmol/L

## 2021-11-17 LAB — TSH: TSH: 3.01 mIU/L (ref 0.40–4.50)

## 2021-12-15 ENCOUNTER — Encounter: Payer: Self-pay | Admitting: Medical-Surgical

## 2021-12-15 DIAGNOSIS — R1013 Epigastric pain: Secondary | ICD-10-CM

## 2021-12-15 DIAGNOSIS — I10 Essential (primary) hypertension: Secondary | ICD-10-CM

## 2021-12-15 DIAGNOSIS — M10322 Gout due to renal impairment, left elbow: Secondary | ICD-10-CM

## 2021-12-15 DIAGNOSIS — I5022 Chronic systolic (congestive) heart failure: Secondary | ICD-10-CM

## 2021-12-15 DIAGNOSIS — E785 Hyperlipidemia, unspecified: Secondary | ICD-10-CM

## 2021-12-15 DIAGNOSIS — K219 Gastro-esophageal reflux disease without esophagitis: Secondary | ICD-10-CM

## 2021-12-15 DIAGNOSIS — Z8673 Personal history of transient ischemic attack (TIA), and cerebral infarction without residual deficits: Secondary | ICD-10-CM

## 2021-12-15 MED ORDER — CALCITRIOL 0.25 MCG PO CAPS
0.2500 ug | ORAL_CAPSULE | Freq: Every day | ORAL | 1 refills | Status: DC
Start: 2021-12-15 — End: 2023-01-14

## 2021-12-15 MED ORDER — FAMOTIDINE 40 MG PO TABS: 40.0000 mg | ORAL_TABLET | Freq: Every day | ORAL | 1 refills | Status: DC

## 2021-12-15 MED ORDER — PANTOPRAZOLE SODIUM 40 MG PO TBEC: 40.0000 mg | DELAYED_RELEASE_TABLET | Freq: Every day | ORAL | 0 refills | Status: DC

## 2021-12-15 MED ORDER — DOXAZOSIN MESYLATE 2 MG PO TABS: 2.0000 mg | ORAL_TABLET | Freq: Every day | ORAL | 1 refills | Status: DC

## 2021-12-15 MED ORDER — ATORVASTATIN CALCIUM 20 MG PO TABS: 20.0000 mg | ORAL_TABLET | Freq: Every day | ORAL | 1 refills | Status: DC

## 2021-12-15 MED ORDER — ASPIRIN 81 MG PO TBEC: 81.0000 mg | DELAYED_RELEASE_TABLET | Freq: Every day | ORAL | 1 refills | Status: DC

## 2021-12-15 MED ORDER — ALLOPURINOL 100 MG PO TABS: 100.0000 mg | ORAL_TABLET | Freq: Every day | ORAL | 1 refills | Status: DC

## 2021-12-15 MED ORDER — CARVEDILOL 25 MG PO TABS: 50.0000 mg | ORAL_TABLET | Freq: Two times a day (BID) | ORAL | 1 refills | Status: DC

## 2021-12-15 MED ORDER — AMLODIPINE BESYLATE 10 MG PO TABS: 10.0000 mg | ORAL_TABLET | Freq: Every day | ORAL | 1 refills | Status: DC

## 2022-01-01 ENCOUNTER — Telehealth: Payer: BC Managed Care – PPO | Admitting: Physician Assistant

## 2022-01-01 DIAGNOSIS — M10379 Gout due to renal impairment, unspecified ankle and foot: Secondary | ICD-10-CM | POA: Diagnosis not present

## 2022-01-01 MED ORDER — PREDNISONE 20 MG PO TABS: 40.0000 mg | ORAL_TABLET | Freq: Every day | ORAL | 0 refills | Status: DC

## 2022-01-01 NOTE — Progress Notes (Signed)
E-Visit for Gout Symptoms  We are sorry that you are not feeling well. We are here to help!  Based on what you shared with me it looks like you have a flare of your gout.  Gout is a form of arthritis. It can cause pain and swelling in the joints. At first, it tends to affect only 1 joint - most frequently the big toe. It happens in people who have too much uric acid in the blood. Uric acid is a chemical that is produced when the body breaks down certain foods. Uric acid can form sharp needle-like crystals that build up in the joints and cause pain. Uric acid crystals can also form inside the tubes that carry urine from the kidneys to the bladder. These crystals can turn into "kidney stones" that can cause pain and problems with the flow of urine. People with gout get sudden "flares" or attacks of severe pain, most often the big toe, ankle, or knee. Often the joint also turns red and swells. Usually, only 1 joint is affected, but some people have pain in more than 1 joint. Gout flares tend to happen more often during the night.  The pain from gout can be extreme. The pain and swelling are worst at the beginning of a gout flare. The symptoms then get better within a few days to weeks. It is not clear how the body "turns off" a gout flare.  Do not start any NEW preventative medicine until the gout has cleared completely. However, If you are already on Probenecid or Allopurinol for CHRONIC gout, you may continue taking this during an active flare up  I have prescribed Prednisone 40 mg daily for 7   HOME CARE Losing weight can help relieve gout. It's not clear that following a specific diet plan will help with gout symptoms but eating a balanced diet can help improve your overall health. It can also help you lose weight, if you are overweight. In general, a healthy diet includes plenty of fruits, vegetables, whole grains, and low-fat dairy products (labelled "low fat", skim, 2%). Avoid sugar sweetened  drinks (including sodas, tea, juice and juice blends, coffee drinks and sports drinks) Limit alcohol to 1-2 drinks of beer, spirits or wine daily these can make gout flares worse. Some people with gout also have other health problems, such as heart disease, high blood pressure, kidney disease, or obesity. If you have any of these issues, it's important to work with your doctor to manage them. This can help improve your overall health and might also help with your gout.  GET HELP RIGHT AWAY IF: Your symptoms persist after you have completed your treatment plan You develop severe diarrhea You develop abnormal sensations  You develop vomiting,   You develop weakness  You develop abdominal pain  FOLLOW UP WITH YOUR PRIMARY PROVIDER IF: If your symptoms do not improve within 10 days  MAKE SURE YOU  Understand these instructions. Will watch your condition. Will get help right away if you are not doing well or get worse.  Thank you for choosing an e-visit.  Your e-visit answers were reviewed by a board certified advanced clinical practitioner to complete your personal care plan. Depending upon the condition, your plan could have included both over the counter or prescription medications.  Please review your pharmacy choice. Make sure the pharmacy is open so you can pick up prescription now. If there is a problem, you may contact your provider through MyChart messaging and have   the prescription routed to another pharmacy.  Your safety is important to us. If you have drug allergies check your prescription carefully.   For the next 24 hours you can use MyChart to ask questions about today's visit, request a non-urgent call back, or ask for a work or school excuse. You will get an email in the next two days asking about your experience. I hope that your e-visit has been valuable and will speed your recovery.  I provided 5 minutes of non face-to-face time during this encounter for chart review and  documentation.   

## 2022-02-11 ENCOUNTER — Encounter (INDEPENDENT_AMBULATORY_CARE_PROVIDER_SITE_OTHER): Payer: BC Managed Care – PPO | Admitting: Medical-Surgical

## 2022-02-11 ENCOUNTER — Other Ambulatory Visit: Payer: Self-pay | Admitting: Physician Assistant

## 2022-02-11 DIAGNOSIS — M10379 Gout due to renal impairment, unspecified ankle and foot: Secondary | ICD-10-CM

## 2022-02-11 DIAGNOSIS — M109 Gout, unspecified: Secondary | ICD-10-CM | POA: Diagnosis not present

## 2022-02-11 MED ORDER — PREDNISONE 50 MG PO TABS: 50.0000 mg | ORAL_TABLET | Freq: Every day | ORAL | 0 refills | Status: DC

## 2022-02-11 NOTE — Telephone Encounter (Signed)
Please see the MyChart message reply(ies) for my assessment and plan.    This patient gave consent for this Medical Advice Message and is aware that it may result in a bill to their insurance company, as well as the possibility of receiving a bill for a co-payment or deductible. They are an established patient, but are not seeking medical advice exclusively about a problem treated during an in person or video visit in the last seven days. I did not recommend an in person or video visit within seven days of my reply.    I spent a total of 6 minutes cumulative time within 7 days through MyChart messaging.  Yajayra Feldt, NP   

## 2022-02-11 NOTE — Addendum Note (Signed)
Addended by: Narda Rutherford on: 02/11/2022 03:43 PM   Modules accepted: Orders

## 2022-04-07 ENCOUNTER — Other Ambulatory Visit: Payer: Self-pay | Admitting: Family

## 2022-04-07 ENCOUNTER — Telehealth: Payer: BC Managed Care – PPO | Admitting: Physician Assistant

## 2022-04-07 DIAGNOSIS — M10329 Gout due to renal impairment, unspecified elbow: Secondary | ICD-10-CM | POA: Diagnosis not present

## 2022-04-07 DIAGNOSIS — M109 Gout, unspecified: Secondary | ICD-10-CM

## 2022-04-07 MED ORDER — PREDNISONE 20 MG PO TABS: 40.0000 mg | ORAL_TABLET | Freq: Every day | ORAL | 0 refills | Status: AC

## 2022-04-07 NOTE — Progress Notes (Signed)
I have spent 5 minutes in review of e-visit questionnaire, review and updating patient chart, medical decision making and response to patient.   Brailyn Delman Cody Donovan Gatchel, PA-C    

## 2022-04-07 NOTE — Progress Notes (Signed)
E-Visit for Gout Symptoms  We are sorry that you are not feeling well. We are here to help!  Based on what you shared with me it looks like you have a flare of your gout.  Gout is a form of arthritis. It can cause pain and swelling in the joints. At first, it tends to affect only 1 joint - most frequently the big toe. It happens in people who have too much uric acid in the blood. Uric acid is a chemical that is produced when the body breaks down certain foods. Uric acid can form sharp needle-like crystals that build up in the joints and cause pain. Uric acid crystals can also form inside the tubes that carry urine from the kidneys to the bladder. These crystals can turn into "kidney stones" that can cause pain and problems with the flow of urine. People with gout get sudden "flares" or attacks of severe pain, most often the big toe, ankle, or knee. Often the joint also turns red and swells. Usually, only 1 joint is affected, but some people have pain in more than 1 joint. Gout flares tend to happen more often during the night.  The pain from gout can be extreme. The pain and swelling are worst at the beginning of a gout flare. The symptoms then get better within a few days to weeks. It is not clear how the body "turns off" a gout flare.  Do not start any NEW preventative medicine until the gout has cleared completely. However, If you are already on Probenecid or Allopurinol for CHRONIC gout, you may continue taking this during an active flare up  I have prescribed Prednisone 40 mg daily for 7 days.    HOME CARE Losing weight can help relieve gout. It's not clear that following a specific diet plan will help with gout symptoms but eating a balanced diet can help improve your overall health. It can also help you lose weight, if you are overweight. In general, a healthy diet includes plenty of fruits, vegetables, whole grains, and low-fat dairy products (labelled "low fat", skim, 2%). Avoid sugar  sweetened drinks (including sodas, tea, juice and juice blends, coffee drinks and sports drinks) Limit alcohol to 1-2 drinks of beer, spirits or wine daily these can make gout flares worse. Some people with gout also have other health problems, such as heart disease, high blood pressure, kidney disease, or obesity. If you have any of these issues, it's important to work with your doctor to manage them. This can help improve your overall health and might also help with your gout.  GET HELP RIGHT AWAY IF: Your symptoms persist after you have completed your treatment plan You develop severe diarrhea You develop abnormal sensations  You develop vomiting,   You develop weakness  You develop abdominal pain  FOLLOW UP WITH YOUR PRIMARY PROVIDER IF: If your symptoms do not improve within 10 days  MAKE SURE YOU  Understand these instructions. Will watch your condition. Will get help right away if you are not doing well or get worse.  Thank you for choosing an e-visit.  Your e-visit answers were reviewed by a board certified advanced clinical practitioner to complete your personal care plan. Depending upon the condition, your plan could have included both over the counter or prescription medications.  Please review your pharmacy choice. Make sure the pharmacy is open so you can pick up prescription now. If there is a problem, you may contact your provider through MyChart messaging   and have the prescription routed to another pharmacy.  Your safety is important to us. If you have drug allergies check your prescription carefully.   For the next 24 hours you can use MyChart to ask questions about today's visit, request a non-urgent call back, or ask for a work or school excuse. You will get an email in the next two days asking about your experience. I hope that your e-visit has been valuable and will speed your recovery.  

## 2022-04-26 ENCOUNTER — Telehealth: Payer: Self-pay | Admitting: Physician Assistant

## 2022-04-26 DIAGNOSIS — M10379 Gout due to renal impairment, unspecified ankle and foot: Secondary | ICD-10-CM

## 2022-04-26 MED ORDER — COLCHICINE 0.6 MG PO TABS: ORAL_TABLET | ORAL | 0 refills | Status: DC

## 2022-04-26 NOTE — Progress Notes (Signed)
E-Visit for Gout Symptoms  We are sorry that you are not feeling well. We are here to help!  Based on what you shared with me it looks like you have a flare of your gout.  Gout is a form of arthritis. It can cause pain and swelling in the joints. At first, it tends to affect only 1 joint - most frequently the big toe. It happens in people who have too much uric acid in the blood. Uric acid is a chemical that is produced when the body breaks down certain foods. Uric acid can form sharp needle-like crystals that build up in the joints and cause pain. Uric acid crystals can also form inside the tubes that carry urine from the kidneys to the bladder. These crystals can turn into "kidney stones" that can cause pain and problems with the flow of urine. People with gout get sudden "flares" or attacks of severe pain, most often the big toe, ankle, or knee. Often the joint also turns red and swells. Usually, only 1 joint is affected, but some people have pain in more than 1 joint. Gout flares tend to happen more often during the night.  The pain from gout can be extreme. The pain and swelling are worst at the beginning of a gout flare. The symptoms then get better within a few days to weeks. It is not clear how the body "turns off" a gout flare.  Do not start any NEW preventative medicine until the gout has cleared completely. However, If you are already on Probenecid or Allopurinol for CHRONIC gout, you may continue taking this during an active flare up  I have prescribed Colchicine 0.6 mg tabs - Take 2 tabs immediately, then 1 tab twice per day for the duration of the flare up to a max of 7 days (but discontinue for stomach pains or diarrhea)    HOME CARE Losing weight can help relieve gout. It's not clear that following a specific diet plan will help with gout symptoms but eating a balanced diet can help improve your overall health. It can also help you lose weight, if you are overweight. In general, a  healthy diet includes plenty of fruits, vegetables, whole grains, and low-fat dairy products (labelled "low fat", skim, 2%). Avoid sugar sweetened drinks (including sodas, tea, juice and juice blends, coffee drinks and sports drinks) Limit alcohol to 1-2 drinks of beer, spirits or wine daily these can make gout flares worse. Some people with gout also have other health problems, such as heart disease, high blood pressure, kidney disease, or obesity. If you have any of these issues, it's important to work with your doctor to manage them. This can help improve your overall health and might also help with your gout.  GET HELP RIGHT AWAY IF: Your symptoms persist after you have completed your treatment plan You develop severe diarrhea You develop abnormal sensations  You develop vomiting,   You develop weakness  You develop abdominal pain  FOLLOW UP WITH YOUR PRIMARY PROVIDER IF: If your symptoms do not improve within 10 days  MAKE SURE YOU  Understand these instructions. Will watch your condition. Will get help right away if you are not doing well or get worse.  Thank you for choosing an e-visit.  Your e-visit answers were reviewed by a board certified advanced clinical practitioner to complete your personal care plan. Depending upon the condition, your plan could have included both over the counter or prescription medications.  Please review your   pharmacy choice. Make sure the pharmacy is open so you can pick up prescription now. If there is a problem, you may contact your provider through MyChart messaging and have the prescription routed to another pharmacy.  Your safety is important to us. If you have drug allergies check your prescription carefully.   For the next 24 hours you can use MyChart to ask questions about today's visit, request a non-urgent call back, or ask for a work or school excuse. You will get an email in the next two days asking about your experience. I hope that your  e-visit has been valuable and will speed your recovery.  I provided 5 minutes of non face-to-face time during this encounter for chart review and documentation.   

## 2022-04-27 ENCOUNTER — Telehealth: Payer: BC Managed Care – PPO | Admitting: Physician Assistant

## 2022-04-27 DIAGNOSIS — L739 Follicular disorder, unspecified: Secondary | ICD-10-CM

## 2022-04-27 MED ORDER — DOXYCYCLINE HYCLATE 100 MG PO TABS: 100.0000 mg | ORAL_TABLET | Freq: Two times a day (BID) | ORAL | 0 refills | Status: AC

## 2022-04-27 NOTE — Progress Notes (Signed)
  E Visit for Folliculitis   We are sorry you are not feeling well.  Here is how we plan to help!  Based on what you have shared with me it looks like you have folliculitis.  Folliculitis refers to inflammation of the superficial or deep portio of the hair follicle.  It can be infectious or non-infectious. Various bacteria, fungi, viruses, and parasites can cause infectious folliculis  Based upon what you have shared with me it looks like you have a bacterial follicultits.  Folliculitis is inflammation of the hair follicles that can be caused by a superficial infection of the skin and is treated with an antibiotic. I have prescribed: and Doxycycline 100 mg twice per day for 7 days    HOME CARE: Apply a warm, moist washcloth or compress using a saltwater solution (1 teaspoon of table salt to 2 cups water) Apply over the counter antibiotic cream, gel or wash  Apply soothing lotions such as oatmeal lotion or over the counter hydrocortisone cream Clean the affected skin twice daily with antibacterial soap. Use clean washcloth and towel each time and do not share with anyone.  Wash these items and clothes that have touched the area with hot soapy water. Protect the skin. If possible avoid shaving.  If you must shave, try an Copy.  When done, rinse skin with warm water and apply moisturizer.  GET HELP RIGHT AWAY IF: You have extensive skin involvement or the symptoms return after treatment Symptoms don't go away after treatment. Severe itching that persists. If you rash spreads or swells. If you rash begins to smell. If it blisters and opens or develops a yellow-brown crust. You develop a fever. You have a sore throat. You become short of breath.  MAKE SURE YOU:  Understand these instructions. Will watch your condition. Will get help right away if you are not doing well or get worse.  Thank you for choosing an e-visit.  Your e-visit answers were reviewed by a board certified  advanced clinical practitioner to complete your personal care plan. Depending upon the condition, your plan could have included both over the counter or prescription medications.  Please review your pharmacy choice. Make sure the pharmacy is open so you can pick up prescription now. If there is a problem, you may contact your provider through [employer] and have the prescription routed to another pharmacy.  Your safety is important to [place]. If you have drug allergies check your prescription carefully.   For the next 24 hours you can use MyChart to ask questions about today's visit, request a non-urgent call back, or ask for a work or school excuse. You will get an email in the next two days asking about your experience. I hope that your e-visit has been valuable and will speed your recovery.  I provided 5 minutes of non face-to-face time during this encounter for chart review, medication and order placement, as well as and documentation.

## 2022-05-18 ENCOUNTER — Telehealth: Payer: Self-pay | Admitting: Nurse Practitioner

## 2022-05-18 NOTE — Progress Notes (Signed)
Zachary Lynch,  Thank you for submitting an e-visit request. Because this is your third flare in the past month I feel your condition warrants further evaluation and I recommend that you be seen for a face to face visit.  Please contact your primary care physician practice to be seen. Many offices offer virtual options to be seen via video if you are not comfortable going in person to a medical facility at this time.  If you cannot be seen by your primary care I would advise an urgent care appointment so that labs can be done and you can get better management of your chronic gout.   NOTE: You will NOT be charged for this eVisit.  If you do not have a PCP, [place] offers a free physician referral service available at [phone]. Our trained staff has the experience, knowledge and resources to put you in touch with a physician who is right for you.    If you are having a true medical emergency please call 911.   Your e-visit answers were reviewed by a board certified advanced clinical practitioner to complete your personal care plan.  Thank you for using e-Visits.

## 2022-06-03 ENCOUNTER — Encounter: Payer: Self-pay | Admitting: Medical-Surgical

## 2022-06-03 ENCOUNTER — Ambulatory Visit (INDEPENDENT_AMBULATORY_CARE_PROVIDER_SITE_OTHER): Payer: BC Managed Care – PPO | Admitting: Medical-Surgical

## 2022-06-03 VITALS — BP 132/79 | HR 84 | Resp 20 | Ht 71.0 in | Wt 274.0 lb

## 2022-06-03 DIAGNOSIS — R7303 Prediabetes: Secondary | ICD-10-CM | POA: Diagnosis not present

## 2022-06-03 DIAGNOSIS — Z0000 Encounter for general adult medical examination without abnormal findings: Secondary | ICD-10-CM

## 2022-06-03 DIAGNOSIS — K219 Gastro-esophageal reflux disease without esophagitis: Secondary | ICD-10-CM

## 2022-06-03 DIAGNOSIS — I10 Essential (primary) hypertension: Secondary | ICD-10-CM | POA: Diagnosis not present

## 2022-06-03 DIAGNOSIS — I5022 Chronic systolic (congestive) heart failure: Secondary | ICD-10-CM

## 2022-06-03 DIAGNOSIS — Z23 Encounter for immunization: Secondary | ICD-10-CM

## 2022-06-03 DIAGNOSIS — M10322 Gout due to renal impairment, left elbow: Secondary | ICD-10-CM | POA: Insufficient documentation

## 2022-06-03 DIAGNOSIS — Z1329 Encounter for screening for other suspected endocrine disorder: Secondary | ICD-10-CM

## 2022-06-03 DIAGNOSIS — E785 Hyperlipidemia, unspecified: Secondary | ICD-10-CM | POA: Diagnosis not present

## 2022-06-03 DIAGNOSIS — N185 Chronic kidney disease, stage 5: Secondary | ICD-10-CM | POA: Diagnosis not present

## 2022-06-03 DIAGNOSIS — L700 Acne vulgaris: Secondary | ICD-10-CM

## 2022-06-03 DIAGNOSIS — I120 Hypertensive chronic kidney disease with stage 5 chronic kidney disease or end stage renal disease: Secondary | ICD-10-CM | POA: Diagnosis not present

## 2022-06-03 MED ORDER — ADAPALENE 0.1 % EX GEL: Freq: Every day | CUTANEOUS | 0 refills | Status: DC

## 2022-06-03 MED ORDER — CARVEDILOL 25 MG PO TABS: 50.0000 mg | ORAL_TABLET | Freq: Two times a day (BID) | ORAL | 1 refills | Status: DC

## 2022-06-03 MED ORDER — PREDNISONE 20 MG PO TABS: 20.0000 mg | ORAL_TABLET | Freq: Two times a day (BID) | ORAL | 0 refills | Status: DC | PRN

## 2022-06-03 MED ORDER — PANTOPRAZOLE SODIUM 40 MG PO TBEC: 40.0000 mg | DELAYED_RELEASE_TABLET | Freq: Every day | ORAL | 1 refills | Status: DC

## 2022-06-03 NOTE — Progress Notes (Signed)
Established Patient Office Visit  Subjective   Patient ID: Zachary Lynch, male   DOB: [birth year] Age: 36 y.o. MRN: [id number]   Chief Complaint  Patient presents with   Hypertension   Gout   HPI Pleasant 36 year old male presenting today for the following:  Hypertension/heart failure: Taking amlodipine 10 mg daily, carvedilol 50 mg twice daily, and doxazosin 2 mg nightly as prescribed, tolerating well without side effects.  Checking blood pressures at home with readings at goal.  Low-sodium diet.  No regular intentional exercise. Denies CP, SOB, palpitations, lower extremity edema, dizziness, headaches, or vision changes.  Gout: Frequent attacks of gout that affected several different areas of his body.  Notes that he currently has the initial tingling and discomfort in his elbow that usually precedes a gout attack and is requesting a prescription for prednisone to have on hand if this progresses to a full attack.  Taking allopurinol 100 mg daily.  GERD: Taking Protonix 40 mg daily and famotidine 40 mg daily as prescribed, tolerating well without side effects.  Symptoms well managed.  Hyperlipidemia: Taking Lipitor 20 mg daily, tolerating well without side effects.  Following a low-fat diet.  CKD stage V: Has a follow-up scheduled with his nephrologist Dr. Graylon Gunning.  Will be seeing him in October.  Has not started dialysis and they are monitoring his labs and overall symptoms.  Prediabetes: History of prediabetes several years ago.  Due for hemoglobin A1c recheck.  Pustular acne: Notes that he has had an issue with frequent skin lesions on his face that rupture and drain purulent discharge.  This also affects his posterior neck and hairline.  He is currently only using water to wash the area.  Previously used several different face washes and products but none of them were helpful.  Has an appointment for an E-visit that diagnosed folliculitis.  At that time they gave him doxycycline 100 mg  twice daily x7 days however he reports this made it worse rather than better.   Objective:    Vitals:   06/03/22 1009  BP: 132/79  Pulse: 84  Resp: 20  Height: 5\' 11"  (1.803 m)  Weight: 274 lb (124.3 kg)  SpO2: 100%  BMI (Calculated): 38.23    Physical Exam Vitals reviewed.  Constitutional:      General: He is not in acute distress.    Appearance: Normal appearance. He is obese. He is not ill-appearing.  HENT:     Head: Normocephalic and atraumatic.  Cardiovascular:     Rate and Rhythm: Normal rate and regular rhythm.     Pulses: Normal pulses.     Heart sounds: Normal heart sounds. No murmur heard.    No friction rub. No gallop.  Pulmonary:     Effort: Pulmonary effort is normal. No respiratory distress.     Breath sounds: Normal breath sounds.  Skin:    General: Skin is warm and dry.  Neurological:     Mental Status: He is alert and oriented to person, place, and time.  Psychiatric:        Mood and Affect: Mood normal.        Behavior: Behavior normal.        Thought Content: Thought content normal.        Judgment: Judgment normal.   No results found for this or any previous visit (from the past 24 hour(s)).     The ASCVD Risk score (Arnett DK, et al., 2019) failed to calculate for the  following reasons:   The 2019 ASCVD risk score is only valid for ages 68 to 104   Assessment & Plan:   1. Uncontrolled stage 2 hypertension 2. Chronic systolic heart failure (HCC) Blood pressure actually well controlled at goal today.  Continue amlodipine 10 mg daily and carvedilol 50 mg twice daily as prescribed.  Monitor blood pressure at home with a goal of 130/80 or less.  Low-sodium diet, regular intentional exercise, weight loss to healthy weight recommended. - carvedilol (COREG) 25 MG tablet; Take 2 tablets (50 mg total) by mouth 2 (two) times daily with a meal.  Dispense: 360 tablet; Refill: 1  3. Acute gout due to renal impairment involving left elbow Checking uric acid  and CMP today.  Continue allopurinol 100 mg daily.  Discussed use of steroids and recommendations for limiting them because of systemic effects.  Since he is having the preliminary issues with his elbow today I will go ahead and send in a burst dose of prednisone however he should not start this unless the symptoms progressed to a full gout attack.  Patient verbalized understanding and is agreeable to the plan. - Uric acid - COMPLETE METABOLIC PANEL WITH GFR  4. Gastroesophageal reflux disease, unspecified whether esophagitis present Continue pantoprazole 40 mg daily and famotidine 40 mg daily.  Symptoms well controlled. - pantoprazole (PROTONIX) 40 MG tablet; Take 1 tablet (40 mg total) by mouth daily.  Dispense: 90 tablet; Refill: 1  5. Dyslipidemia, goal LDL below 100 Checking lipid panel. - Lipid panel  6. CKD stage 5 secondary to hypertension ([place]) Checking labs as below.  Follow-up with nephrology as scheduled. - Uric acid - CBC with Differential/Platelet - COMPLETE METABOLIC PANEL WITH GFR - Phosphorus  7. Prediabetes Checking hemoglobin A1c. - Hemoglobin A1c  8. Thyroid disorder screen Checking TSH. - TSH  9. Pustular acne Discussed various options to treat pustular acne.  Unfortunately, his renal function and other conditions do limit our treatment options.  Advised to use a gentle facial cleanser such as Cetaphil or CeraVe.  Also recommended trialing adapalene gel to see if this would make a difference.  Advised that this may make his skin somewhat irritated over the next 2 weeks and he may experience redness and peeling however this usually calms down and his symptoms will clear on their own.  10. Need for Tdap vaccination Tdap given in office today. - Tdap va  Return in about 6 months (around 12/02/2022) for chronic disease follow up or sooner if needed.  ___________________________________________ Clearnce Sorrel, DNP, APRN, FNP-BC [facility] [place]

## 2022-06-04 LAB — COMPLETE METABOLIC PANEL WITH GFR
AG Ratio: 1.2 (calc) (ref 1.0–2.5)
ALT: 14 U/L (ref 9–46)
AST: 15 U/L (ref 10–40)
Albumin: 3.9 g/dL (ref 3.6–5.1)
Alkaline phosphatase (APISO): 108 U/L (ref 36–130)
BUN/Creatinine Ratio: 5 (calc) — ABNORMAL LOW (ref 6–22)
BUN: 60 mg/dL — ABNORMAL HIGH (ref 7–25)
CO2: 18 mmol/L — ABNORMAL LOW (ref 20–32)
Calcium: 9.2 mg/dL (ref 8.6–10.3)
Chloride: 109 mmol/L (ref 98–110)
Creat: 12.5 mg/dL — ABNORMAL HIGH (ref 0.60–1.26)
Globulin: 3.3 g/dL (calc) (ref 1.9–3.7)
Glucose, Bld: 81 mg/dL (ref 65–99)
Potassium: 5.2 mmol/L (ref 3.5–5.3)
Sodium: 139 mmol/L (ref 135–146)
Total Bilirubin: 0.3 mg/dL (ref 0.2–1.2)
Total Protein: 7.2 g/dL (ref 6.1–8.1)
eGFR: 5 mL/min/{1.73_m2} — ABNORMAL LOW (ref >=60)

## 2022-06-04 LAB — HEMOGLOBIN A1C
Hgb A1c MFr Bld: 5.3 % of total Hgb (ref <5.7)
Mean Plasma Glucose: 105 mg/dL
eAG (mmol/L): 5.8 mmol/L

## 2022-06-04 LAB — CBC WITH DIFFERENTIAL/PLATELET
Absolute Monocytes: 514 cells/uL (ref 200–950)
Basophils Absolute: 39 cells/uL (ref 0–200)
Basophils Relative: 0.4 %
Eosinophils Absolute: 243 cells/uL (ref 15–500)
Eosinophils Relative: 2.5 %
HCT: 35.5 % — ABNORMAL LOW (ref 38.5–50.0)
Hemoglobin: 11.8 g/dL — ABNORMAL LOW (ref 13.2–17.1)
Lymphs Abs: 2910 cells/uL (ref 850–3900)
MCH: 30.5 pg (ref 27.0–33.0)
MCHC: 33.2 g/dL (ref 32.0–36.0)
MCV: 91.7 fL (ref 80.0–100.0)
MPV: 10 fL (ref 7.5–12.5)
Monocytes Relative: 5.3 %
Neutro Abs: 5995 cells/uL (ref 1500–7800)
Neutrophils Relative %: 61.8 %
Platelets: 241 10*3/uL (ref 140–400)
RBC: 3.87 10*6/uL — ABNORMAL LOW (ref 4.20–5.80)
RDW: 15.4 % — ABNORMAL HIGH (ref 11.0–15.0)
Total Lymphocyte: 30 %
WBC: 9.7 10*3/uL (ref 3.8–10.8)

## 2022-06-04 LAB — LIPID PANEL
Cholesterol: 97 mg/dL (ref <200)
HDL: 26 mg/dL — ABNORMAL LOW (ref >=40)
LDL Cholesterol (Calc): 55 mg/dL (calc)
Non-HDL Cholesterol (Calc): 71 mg/dL (calc) (ref <130)
Total CHOL/HDL Ratio: 3.7 (calc) (ref <5.0)
Triglycerides: 80 mg/dL (ref <150)

## 2022-06-04 LAB — URIC ACID: Uric Acid, Serum: 9.9 mg/dL — ABNORMAL HIGH (ref 4.0–8.0)

## 2022-06-04 LAB — TSH: TSH: 2.9 mIU/L (ref 0.40–4.50)

## 2022-06-04 LAB — PHOSPHORUS: Phosphorus: 5.8 mg/dL — ABNORMAL HIGH (ref 2.5–4.5)

## 2022-06-14 ENCOUNTER — Encounter: Payer: Self-pay | Admitting: Medical-Surgical

## 2022-06-14 MED ORDER — ADAPALENE 0.1 % EX CREA: TOPICAL_CREAM | Freq: Every day | CUTANEOUS | 0 refills | Status: DC

## 2022-06-14 MED ORDER — ALLOPURINOL 200 MG PO TABS: 200.0000 mg | ORAL_TABLET | Freq: Every day | ORAL | 1 refills | Status: DC

## 2022-06-15 MED ORDER — ADAPALENE 0.1 % EX CREA: TOPICAL_CREAM | Freq: Every day | CUTANEOUS | 0 refills | Status: DC

## 2022-06-15 NOTE — Addendum Note (Signed)
Addended by: Narda Rutherford on: 06/15/2022 08:48 AM   Modules accepted: Orders

## 2022-06-21 ENCOUNTER — Telehealth: Payer: Self-pay

## 2022-06-21 MED ORDER — ALLOPURINOL 200 MG PO TABS: 200.0000 mg | ORAL_TABLET | Freq: Every day | ORAL | 1 refills | Status: DC

## 2022-06-21 NOTE — Telephone Encounter (Signed)
Initiated Prior authorization PTW:[id number] 0.1% cream Via: Covermymeds Case/Key:[id number] Status: Cancelled  as of 06/21/22 Reason:Pt does not have active insurance,pt insurance has termed 06/19/22 Notified Pt via: Mychart

## 2022-06-21 NOTE — Addendum Note (Signed)
Addended by: Narda Rutherford on: 06/21/2022 08:40 AM   Modules accepted: Orders

## 2022-07-07 ENCOUNTER — Other Ambulatory Visit: Payer: Self-pay | Admitting: Medical-Surgical

## 2022-09-02 ENCOUNTER — Encounter: Payer: Self-pay | Admitting: Medical-Surgical

## 2022-09-02 ENCOUNTER — Other Ambulatory Visit: Payer: Self-pay | Admitting: Medical-Surgical

## 2022-09-02 NOTE — Telephone Encounter (Signed)
Last seen in September.

## 2022-09-03 ENCOUNTER — Telehealth: Payer: Self-pay | Admitting: Emergency Medicine

## 2022-09-03 DIAGNOSIS — M109 Gout, unspecified: Secondary | ICD-10-CM

## 2022-09-03 DIAGNOSIS — M10379 Gout due to renal impairment, unspecified ankle and foot: Secondary | ICD-10-CM

## 2022-09-03 MED ORDER — PREDNISONE 10 MG PO TABS: 30.0000 mg | ORAL_TABLET | Freq: Every day | ORAL | 0 refills | Status: AC

## 2022-09-03 MED ORDER — COLCHICINE 0.6 MG PO TABS: ORAL_TABLET | ORAL | 0 refills | Status: DC

## 2022-09-03 NOTE — Progress Notes (Signed)
E-Visit for Gout Symptoms  We are sorry that you are not feeling well. We are here to help!  Based on what you shared with me it looks like you have a flare of your gout.  Gout is a form of arthritis. It can cause pain and swelling in the joints. At first, it tends to affect only 1 joint - most frequently the big toe. It happens in people who have too much uric acid in the blood. Uric acid is a chemical that is produced when the body breaks down certain foods. Uric acid can form sharp needle-like crystals that build up in the joints and cause pain. Uric acid crystals can also form inside the tubes that carry urine from the kidneys to the bladder. These crystals can turn into "kidney stones" that can cause pain and problems with the flow of urine. People with gout get sudden "flares" or attacks of severe pain, most often the big toe, ankle, or knee. Often the joint also turns red and swells. Usually, only 1 joint is affected, but some people have pain in more than 1 joint. Gout flares tend to happen more often during the night.  The pain from gout can be extreme. The pain and swelling are worst at the beginning of a gout flare. The symptoms then get better within a few days to weeks. It is not clear how the body "turns off" a gout flare.  Do not start any NEW preventative medicine until the gout has cleared completely. However, If you are already on Probenecid or Allopurinol for CHRONIC gout, you may continue taking this during an active flare up  I have prescribed Colchicine 0.6 mg tabs - Take 2 tabs immediately, then 1 tab twice per day for the duration of the flare up to a max of 7 days (but discontinue for stomach pains or diarrhea)    HOME CARE Losing weight can help relieve gout. It's not clear that following a specific diet plan will help with gout symptoms but eating a balanced diet can help improve your overall health. It can also help you lose weight, if you are overweight. In general, a  healthy diet includes plenty of fruits, vegetables, whole grains, and low-fat dairy products (labelled "low fat", skim, 2%). Avoid sugar sweetened drinks (including sodas, tea, juice and juice blends, coffee drinks and sports drinks) Limit alcohol to 1-2 drinks of beer, spirits or wine daily these can make gout flares worse. Some people with gout also have other health problems, such as heart disease, high blood pressure, kidney disease, or obesity. If you have any of these issues, it's important to work with your doctor to manage them. This can help improve your overall health and might also help with your gout.  GET HELP RIGHT AWAY IF: Your symptoms persist after you have completed your treatment plan You develop severe diarrhea You develop abnormal sensations  You develop vomiting,   You develop weakness  You develop abdominal pain  FOLLOW UP WITH YOUR PRIMARY PROVIDER IF: If your symptoms do not improve within 10 days  MAKE SURE YOU  Understand these instructions. Will watch your condition. Will get help right away if you are not doing well or get worse.  Thank you for choosing an e-visit.  Your e-visit answers were reviewed by a board certified advanced clinical practitioner to complete your personal care plan. Depending upon the condition, your plan could have included both over the counter or prescription medications.  Please review your  pharmacy choice. Make sure the pharmacy is open so you can pick up prescription now. If there is a problem, you may contact your provider through [employer] and have the prescription routed to another pharmacy.  Your safety is important to [place]. If you have drug allergies check your prescription carefully.   For the next 24 hours you can use MyChart to ask questions about today's visit, request a non-urgent call back, or ask for a work or school excuse. You will get an email in the next two days asking about your experience. I hope that your  e-visit has been valuable and will speed your recovery.  I have spent 5 minutes in review of e-visit questionnaire, review and updating patient chart, medical decision making and response to patient.   Willeen Cass, PhD, FNP-BC

## 2022-09-03 NOTE — Addendum Note (Signed)
Addended by: Carvel Getting on: 09/03/2022 02:18 PM   Modules accepted: Orders

## 2022-11-19 ENCOUNTER — Other Ambulatory Visit (HOSPITAL_COMMUNITY): Payer: Self-pay

## 2022-12-08 ENCOUNTER — Telehealth: Payer: Self-pay | Admitting: Physician Assistant

## 2022-12-08 DIAGNOSIS — M1030 Gout due to renal impairment, unspecified site: Secondary | ICD-10-CM

## 2022-12-09 MED ORDER — PREDNISONE 20 MG PO TABS: 40.0000 mg | ORAL_TABLET | Freq: Every day | ORAL | 0 refills | Status: DC

## 2022-12-09 NOTE — Progress Notes (Signed)
I have spent 5 minutes in review of e-visit questionnaire, review and updating patient chart, medical decision making and response to patient.   Zachary Lynch Cody Argentina Kosch, PA-C    

## 2022-12-09 NOTE — Progress Notes (Signed)
E-Visit for Gout Symptoms  We are sorry that you are not feeling well. We are here to help!  Based on what you shared with me it looks like you have a flare of your gout.  Gout is a form of arthritis. It can cause pain and swelling in the joints. At first, it tends to affect only 1 joint - most frequently the big toe. It happens in people who have too much uric acid in the blood. Uric acid is a chemical that is produced when the body breaks down certain foods. Uric acid can form sharp needle-like crystals that build up in the joints and cause pain. Uric acid crystals can also form inside the tubes that carry urine from the kidneys to the bladder. These crystals can turn into "kidney stones" that can cause pain and problems with the flow of urine. People with gout get sudden "flares" or attacks of severe pain, most often the big toe, ankle, or knee. Often the joint also turns red and swells. Usually, only 1 joint is affected, but some people have pain in more than 1 joint. Gout flares tend to happen more often during the night.  The pain from gout can be extreme. The pain and swelling are worst at the beginning of a gout flare. The symptoms then get better within a few days to weeks. It is not clear how the body "turns off" a gout flare.  Do not start any NEW preventative medicine until the gout has cleared completely. However, If you are already on Probenecid or Allopurinol for CHRONIC gout, you may continue taking this during an active flare up  I have prescribed Prednisone 40 mg daily for 5 days.   HOME CARE Losing weight can help relieve gout. It's not clear that following a specific diet plan will help with gout symptoms but eating a balanced diet can help improve your overall health. It can also help you lose weight, if you are overweight. In general, a healthy diet includes plenty of fruits, vegetables, whole grains, and low-fat dairy products (labelled "low fat", skim, 2%). Avoid sugar  sweetened drinks (including sodas, tea, juice and juice blends, coffee drinks and sports drinks) Limit alcohol to 1-2 drinks of beer, spirits or wine daily these can make gout flares worse. Some people with gout also have other health problems, such as heart disease, high blood pressure, kidney disease, or obesity. If you have any of these issues, it's important to work with your doctor to manage them. This can help improve your overall health and might also help with your gout.  GET HELP RIGHT AWAY IF: Your symptoms persist after you have completed your treatment plan You develop severe diarrhea You develop abnormal sensations  You develop vomiting,   You develop weakness  You develop abdominal pain  FOLLOW UP WITH YOUR PRIMARY PROVIDER IF: If your symptoms do not improve within 10 days  MAKE SURE YOU  Understand these instructions. Will watch your condition. Will get help right away if you are not doing well or get worse.  Thank you for choosing an e-visit.  Your e-visit answers were reviewed by a board certified advanced clinical practitioner to complete your personal care plan. Depending upon the condition, your plan could have included both over the counter or prescription medications.  Please review your pharmacy choice. Make sure the pharmacy is open so you can pick up prescription now. If there is a problem, you may contact your provider through [employer] and  have the prescription routed to another pharmacy.  Your safety is important to [place]. If you have drug allergies check your prescription carefully.   For the next 24 hours you can use MyChart to ask questions about today's visit, request a non-urgent call back, or ask for a work or school excuse. You will get an email in the next two days asking about your experience. I hope that your e-visit has been valuable and will speed your recovery.

## 2023-01-05 ENCOUNTER — Telehealth: Payer: Self-pay | Admitting: Nurse Practitioner

## 2023-01-05 DIAGNOSIS — N5089 Other specified disorders of the male genital organs: Secondary | ICD-10-CM

## 2023-01-05 NOTE — Progress Notes (Signed)
Zachary Lynch,  Thank you for submitting an e-visit request. Since we cannot assess genital regions over video or any virtual platform you will need to go to an Urgent Care or to your primary care for assessment.   I apologize for the inconvenience.  I feel your condition warrants further evaluation and I recommend that you be seen in a face to face visit.   NOTE: There will be NO CHARGE for this eVisit   If you are having a true medical emergency please call 911.      For an urgent face to face visit, [place] has eight urgent care centers for your convenience:   NEW!! [facility] Health Urgent [facility] Center at [facility] Get Driving Directions [phone] [address], [address] [place], [address]    [facility] Health Urgent Care Center at [facility] Get Driving Directions [phone] [address] [place], [place] [address]   [facility] Health Urgent Care Center [facility]) Get Driving Directions [phone] [address] [place], [place] [address]  [facility] Health Urgent Care Center [facility]) Get Driving Directions [phone] [address] [place],  [place]  [address]  [facility] Health Urgent Care Center [facility] - at [employer]  [phone] [address] [place],  [place] [address]   [facility] Care at [facility] Get Driving Directions [phone] [address] [place], [place] [address]   [facility] Health Urgent Care at [facility] Get Driving Directions  [phone] [address] [place], [place] [address]   [facility] Health Urgent Care at [facility] Directions [phone] [address] [place], [place] [address]  Your MyChart E-visit questionnaire answers were reviewed by a board certified advanced clinical practitioner to complete your personal care plan based on your specific symptoms.  Thank you for using  e-Visits.

## 2023-01-14 ENCOUNTER — Encounter: Payer: Self-pay | Admitting: Medical-Surgical

## 2023-01-14 ENCOUNTER — Ambulatory Visit (INDEPENDENT_AMBULATORY_CARE_PROVIDER_SITE_OTHER): Payer: PRIVATE HEALTH INSURANCE | Admitting: Medical-Surgical

## 2023-01-14 ENCOUNTER — Other Ambulatory Visit (HOSPITAL_COMMUNITY): Payer: Self-pay

## 2023-01-14 VITALS — BP 130/86 | HR 96 | Resp 20 | Ht 71.0 in | Wt 250.5 lb

## 2023-01-14 DIAGNOSIS — R112 Nausea with vomiting, unspecified: Secondary | ICD-10-CM

## 2023-01-14 DIAGNOSIS — M1A39X0 Chronic gout due to renal impairment, multiple sites, without tophus (tophi): Secondary | ICD-10-CM

## 2023-01-14 DIAGNOSIS — I5022 Chronic systolic (congestive) heart failure: Secondary | ICD-10-CM

## 2023-01-14 DIAGNOSIS — R0781 Pleurodynia: Secondary | ICD-10-CM

## 2023-01-14 DIAGNOSIS — K219 Gastro-esophageal reflux disease without esophagitis: Secondary | ICD-10-CM

## 2023-01-14 DIAGNOSIS — R1011 Right upper quadrant pain: Secondary | ICD-10-CM

## 2023-01-14 DIAGNOSIS — I10 Essential (primary) hypertension: Secondary | ICD-10-CM | POA: Diagnosis not present

## 2023-01-14 LAB — POC COVID19 BINAXNOW: SARS Coronavirus 2 Ag: NEGATIVE

## 2023-01-14 LAB — CBC WITH DIFFERENTIAL/PLATELET
Absolute Monocytes: 995 cells/uL — ABNORMAL HIGH (ref 200–950)
Lymphs Abs: 1697 cells/uL (ref 850–3900)
Monocytes Relative: 5.1 %
Neutro Abs: 16731 cells/uL — ABNORMAL HIGH (ref 1500–7800)
Neutrophils Relative %: 85.8 %
Total Lymphocyte: 8.7 %
WBC: 19.5 10*3/uL — ABNORMAL HIGH (ref 3.8–10.8)

## 2023-01-14 MED ORDER — CLINDAMYCIN PHOS-BENZOYL PEROX 1.2-5 % EX GEL: 1.0000 | Freq: Two times a day (BID) | CUTANEOUS | 11 refills | Status: DC

## 2023-01-14 MED ORDER — PREDNISONE 50 MG PO TABS: 50.0000 mg | ORAL_TABLET | Freq: Every day | ORAL | 0 refills | Status: DC

## 2023-01-14 MED ORDER — FAMOTIDINE 40 MG PO TABS: 40.0000 mg | ORAL_TABLET | Freq: Every day | ORAL | 1 refills | Status: DC

## 2023-01-14 MED ORDER — AMLODIPINE BESYLATE 10 MG PO TABS: 10.0000 mg | ORAL_TABLET | Freq: Every day | ORAL | 1 refills | Status: DC

## 2023-01-14 MED ORDER — PANTOPRAZOLE SODIUM 40 MG PO TBEC: 40.0000 mg | DELAYED_RELEASE_TABLET | Freq: Every day | ORAL | 1 refills | Status: DC

## 2023-01-14 MED ORDER — ALLOPURINOL 100 MG PO TABS: 200.0000 mg | ORAL_TABLET | Freq: Every day | ORAL | 0 refills | Status: DC

## 2023-01-14 MED ORDER — CARVEDILOL 25 MG PO TABS: 50.0000 mg | ORAL_TABLET | Freq: Two times a day (BID) | ORAL | 1 refills | Status: DC

## 2023-01-14 NOTE — Progress Notes (Signed)
        Established patient visit  History, exam, impression, and plan:  1. Uncontrolled stage 2 hypertension Pleasant 37 year old male with a history of uncontrolled stage II hypertension presenting today for follow-up.  He is currently taking amlodipine 10 mg daily and Coreg 50 mg twice daily as prescribed, tolerating well without side effects.  Checking blood pressure at home and reports readings are usually pretty good.  Denies chest pain, shortness of breath, palpitations, lower extremity edema, headaches, dizziness, vision changes, and syncopal episodes.  HRRR, S1/S2 normal.  Lungs CTA.  Respirations even unlabored.  No peripheral edema.  Blood pressure at goal.  Continue current medications as prescribed.  Checking labs as below. - amLODipine (NORVASC) 10 MG tablet; Take 1 tablet (10 mg total) by mouth daily.  Dispense: 90 tablet; Refill: 1 - CBC with Differential/Platelet - COMPLETE METABOLIC PANEL WITH GFR - Lipid panel  2. Chronic systolic heart failure (HCC) Doing well on Coreg as noted above.  No concerning symptoms today. - carvedilol (COREG) 25 MG tablet; Take 2 tablets (50 mg total) by mouth 2 (two) times daily with a meal.  Dispense: 360 tablet; Refill: 1  3. Gastroesophageal reflux disease, unspecified whether esophagitis present Well-managed with pantoprazole 40 mg daily.  Has been on this long-term and it works to keep his symptoms controlled.  Does note that symptoms return if the medication is missed or stopped. - pantoprazole (PROTONIX) 40 MG tablet; Take 1 tablet (40 mg total) by mouth daily.  Dispense: 90 tablet; Refill: 1  4. Chronic gout due to renal impairment of multiple sites without tophus Chronic issues with gout due to renal impairment.  This affects multiple joints in his body.  NSAIDs and colchicine contraindicated due to renal function.  Checking uric acid today.  Taking allopurinol 100 mg daily however he does increase this to 100 mg daily when he has flares.   Has been using prednisone for flares but unfortunately we are having to do bursts nearly every month.  Discussed risks with chronic prednisone use.  Referring to rheumatology for further evaluation. - allopurinol (ZYLOPRIM) 100 MG tablet; Take 2 tablets (200 mg total) by mouth daily.  Dispense: 180 tablet; Refill: 0 - Ambulatory referral to Rheumatology - Uric acid  5. RUQ pain 6. Nausea and vomiting, unspecified vomiting type 7. Pleuritic chest pain Unclear etiology.  Has had approximately 5-6 days of difficulty tolerating food including right upper quadrant pain, unintentional weight loss, coughing, and mild shortness of breath.  Felt feverish on Monday and Tuesday but has also had chills.  Having regular bowel movements without constipation or diarrhea.  Pain is worse when taking a deep breath and described as sharp and stabbing.  Unable to reproduce pain in the lower ribs.  Abdomen soft, nontender, bowel sounds positive x 4 quadrants.  No mass or hepatomegaly noted.  Checking labs noted above and adding amylase and lipase evaluation today.  Getting a chest x-ray today.  POCT COVID-negative.  With multiple comorbidities, recent unintentional weight loss, and difficulty tolerating food, low threshold for further imaging. - Amylase - Lipase - DG Chest 2 View; Future  Procedures performed this visit: None.  Return if symptoms worsen or fail to improve.  __________________________________ Thayer Ohm, DNP, APRN, FNP-BC Primary Care and Sports Medicine [facility]

## 2023-01-15 ENCOUNTER — Telehealth: Payer: Self-pay | Admitting: Family Medicine

## 2023-01-15 LAB — CBC WITH DIFFERENTIAL/PLATELET
Basophils Absolute: 20 cells/uL (ref 0–200)
Basophils Relative: 0.1 %
Eosinophils Absolute: 59 cells/uL (ref 15–500)
Eosinophils Relative: 0.3 %
HCT: 30.3 % — ABNORMAL LOW (ref 38.5–50.0)
Hemoglobin: 10.1 g/dL — ABNORMAL LOW (ref 13.2–17.1)
MCH: 31.5 pg (ref 27.0–33.0)
MCHC: 33.3 g/dL (ref 32.0–36.0)
MCV: 94.4 fL (ref 80.0–100.0)
MPV: 10.1 fL (ref 7.5–12.5)
Platelets: 248 10*3/uL (ref 140–400)
RBC: 3.21 10*6/uL — ABNORMAL LOW (ref 4.20–5.80)
RDW: 14.4 % (ref 11.0–15.0)

## 2023-01-15 LAB — LIPID PANEL
Cholesterol: 109 mg/dL (ref <200)
HDL: 21 mg/dL — ABNORMAL LOW (ref >=40)
LDL Cholesterol (Calc): 68 mg/dL (calc)
Non-HDL Cholesterol (Calc): 88 mg/dL (calc) (ref <130)
Total CHOL/HDL Ratio: 5.2 (calc) — ABNORMAL HIGH (ref <5.0)
Triglycerides: 118 mg/dL (ref <150)

## 2023-01-15 LAB — COMPLETE METABOLIC PANEL WITH GFR
AG Ratio: 1.1 (calc) (ref 1.0–2.5)
ALT: 14 U/L (ref 9–46)
AST: 13 U/L (ref 10–40)
Albumin: 3.4 g/dL — ABNORMAL LOW (ref 3.6–5.1)
Alkaline phosphatase (APISO): 54 U/L (ref 36–130)
BUN/Creatinine Ratio: 7 (calc) (ref 6–22)
BUN: 115 mg/dL — ABNORMAL HIGH (ref 7–25)
CO2: 14 mmol/L — ABNORMAL LOW (ref 20–32)
Calcium: 8.2 mg/dL — ABNORMAL LOW (ref 8.6–10.3)
Chloride: 105 mmol/L (ref 98–110)
Creat: 16.54 mg/dL — ABNORMAL HIGH (ref 0.60–1.26)
Globulin: 3.2 g/dL (calc) (ref 1.9–3.7)
Glucose, Bld: 77 mg/dL (ref 65–99)
Potassium: 5.5 mmol/L — ABNORMAL HIGH (ref 3.5–5.3)
Sodium: 137 mmol/L (ref 135–146)
Total Bilirubin: 0.4 mg/dL (ref 0.2–1.2)
Total Protein: 6.6 g/dL (ref 6.1–8.1)
eGFR: 3 mL/min/{1.73_m2} — ABNORMAL LOW (ref >=60)

## 2023-01-15 LAB — AMYLASE: Amylase: 170 U/L — ABNORMAL HIGH (ref 21–101)

## 2023-01-15 LAB — URIC ACID: Uric Acid, Serum: 8.8 mg/dL — ABNORMAL HIGH (ref 4.0–8.0)

## 2023-01-15 LAB — LIPASE: Lipase: 343 U/L — ABNORMAL HIGH (ref 7–60)

## 2023-01-15 NOTE — Telephone Encounter (Signed)
Contacted by charge nurse over the weekend. Stating pt had lipase of 219 with creatinine of 16 with baseline ESRD. Unable to reach pt to inform him to go to ER for nausea/vomiting with critical labs. Unable to reach. GFR noted to be 5 or less for at least a year. Appears to have [employer] notes under media but last one in 2021. Vein mapping document from 2022. Assuming he's on dialysis but informing PCP

## 2023-01-17 ENCOUNTER — Encounter: Payer: Self-pay | Admitting: Medical-Surgical

## 2023-01-20 MED ORDER — DOXYCYCLINE HYCLATE 100 MG PO TABS: 100.0000 mg | ORAL_TABLET | Freq: Two times a day (BID) | ORAL | 0 refills | Status: AC

## 2023-03-11 ENCOUNTER — Other Ambulatory Visit: Payer: Self-pay | Admitting: Medical-Surgical

## 2023-03-11 NOTE — Telephone Encounter (Signed)
Requesting predinsone as 50mg  Last written and in chart as 20mg  on 12/09/2022 Last OV 01/14/2023 No upcoming appt scheduled.

## 2023-03-15 ENCOUNTER — Encounter: Payer: Self-pay | Admitting: Medical-Surgical

## 2023-03-15 DIAGNOSIS — M1030 Gout due to renal impairment, unspecified site: Secondary | ICD-10-CM

## 2023-03-15 MED ORDER — PREDNISONE 20 MG PO TABS: 40.0000 mg | ORAL_TABLET | Freq: Every day | ORAL | 0 refills | Status: DC

## 2023-04-11 ENCOUNTER — Encounter: Payer: Self-pay | Admitting: Medical-Surgical

## 2023-04-11 ENCOUNTER — Telehealth: Payer: PRIVATE HEALTH INSURANCE | Admitting: Medical-Surgical

## 2023-04-11 DIAGNOSIS — M1A39X0 Chronic gout due to renal impairment, multiple sites, without tophus (tophi): Secondary | ICD-10-CM | POA: Diagnosis not present

## 2023-04-11 DIAGNOSIS — L739 Follicular disorder, unspecified: Secondary | ICD-10-CM

## 2023-04-11 MED ORDER — PREDNISONE 20 MG PO TABS: 40.0000 mg | ORAL_TABLET | Freq: Every day | ORAL | 0 refills | Status: DC

## 2023-04-11 NOTE — Progress Notes (Signed)
Virtual Visit via Video Note  I connected with Zachary Lynch on 04/11/23 at 10:30 AM EDT by a video enabled telemedicine application and verified that I am speaking with the correct person using two identifiers.   I discussed the limitations of evaluation and management by telemedicine and the availability of in person appointments. The patient expressed understanding and agreed to proceed.  Patient location: home Provider locations: office  Subjective:    CC: Gout, folliculitis  HPI: Pleasant 37 year old male presenting via MyChart video visit with complaints about the following:  Gout: Due to severe renal failure, he has frequent issues with gout flares.  He is currently taking allopurinol 200 mg daily, tolerating well without side effects.  Last uric acid greater than 8 showing poor control.  Has been using prednisone 40 mg daily x 5 days for burst but admits that he takes 40 mg daily as needed at the earliest sign of a gout flare rather than waiting for it to turn into a full flare.  Is requesting to have prednisone called in with refills so he can continue to use this without having an appointment each time.  He does have a rheumatology referral and placed on appointment coming up in early September to discuss gout management.  Folliculitis: He does have a history of folliculitis that affects both the facial region as well as the groin.  Not currently sexually active and is not concerned for sexually transmitted infections.  Has clindamycin-benzyl peroxide gel twice daily however this does not seem to make much of a difference.  Noted an improvement in his symptoms when he took doxycycline orally.  Is interested in seeing dermatology and would like to have a referral to a different person since he missed his appointment with the previous 1.   Past medical history, Surgical history, Family history not pertinant except as noted below, Social history, Allergies, and medications have been  entered into the medical record, reviewed, and corrections made.   Review of Systems: See HPI for pertinent positives and negatives.   Objective:    General: Speaking clearly in complete sentences without any shortness of breath.  Alert and oriented x3.  Normal judgment. No apparent acute distress.  Impression and Recommendations:    1. Chronic gout due to renal impairment of multiple sites without tophus Discussed the importance of avoiding chronic use of steroids due to multiple systemic effects.  Continue allopurinol 200 mg daily for renal dosing.  Unfortunately this is not working well to keep his uric acid suppressed.  Sending in a refill of prednisone 40 mg daily x 5 days.  Has a scheduled appointment with rheumatology in approximately 6 weeks but I would greatly urged him to keep to discuss other options for management of gout. - predniSONE (DELTASONE) 20 MG tablet; Take 2 tablets (40 mg total) by mouth daily with breakfast.  Dispense: 10 tablet; Refill: 0  2. Folliculitis This has been a recurrent issue.  Does respond well to doxycycline however frequency of recurrences remain concerning.  Continue topical clindamycin.  Referring to dermatology for further evaluation. - Ambulatory referral to Dermatology  I discussed the assessment and treatment plan with the patient. The patient was provided an opportunity to ask questions and all were answered. The patient agreed with the plan and demonstrated an understanding of the instructions.   The patient was advised to call back or seek an in-person evaluation if the symptoms worsen or if the condition fails to improve as anticipated.  20  minutes of non-face-to-face time was provided during this encounter.  Return if symptoms worsen or fail to improve.  Thayer Ohm, DNP, APRN, FNP-BC [facility] and Sports Medicine

## 2023-04-13 ENCOUNTER — Encounter: Payer: Self-pay | Admitting: Medical-Surgical

## 2023-04-13 MED ORDER — DOXYCYCLINE HYCLATE 100 MG PO TABS: 100.0000 mg | ORAL_TABLET | Freq: Two times a day (BID) | ORAL | 0 refills | Status: AC

## 2023-04-13 MED ORDER — OFLOXACIN 0.3 % OP SOLN: OPHTHALMIC | 0 refills | Status: DC

## 2023-05-05 ENCOUNTER — Other Ambulatory Visit (HOSPITAL_BASED_OUTPATIENT_CLINIC_OR_DEPARTMENT_OTHER): Payer: Self-pay

## 2023-05-11 ENCOUNTER — Other Ambulatory Visit: Payer: Self-pay | Admitting: Medical-Surgical

## 2023-05-11 DIAGNOSIS — M1A39X0 Chronic gout due to renal impairment, multiple sites, without tophus (tophi): Secondary | ICD-10-CM

## 2023-05-12 ENCOUNTER — Telehealth: Payer: 59 | Admitting: Nurse Practitioner

## 2023-05-12 DIAGNOSIS — M1A9XX0 Chronic gout, unspecified, without tophus (tophi): Secondary | ICD-10-CM

## 2023-05-12 MED ORDER — PREDNISONE 20 MG PO TABS
40.0000 mg | ORAL_TABLET | Freq: Every day | ORAL | 0 refills | Status: DC
Start: 2023-05-12 — End: 2023-05-18

## 2023-05-12 MED ORDER — PREDNISONE 20 MG PO TABS
40.0000 mg | ORAL_TABLET | Freq: Every day | ORAL | 0 refills | Status: DC
Start: 2023-05-12 — End: 2023-05-21

## 2023-05-12 NOTE — Telephone Encounter (Signed)
Refill sent of prednisone burst however once he sees rheumatology, this will need to be managed by them.

## 2023-05-12 NOTE — Progress Notes (Signed)
E-Visit for Gout Symptoms  We are sorry that you are not feeling well. We are here to help!  Based on what you shared with me it looks like you have a flare of your gout.  Gout is a form of arthritis. It can cause pain and swelling in the joints. At first, it tends to affect only 1 joint - most frequently the big toe. It happens in people who have too much uric acid in the blood. Uric acid is a chemical that is produced when the body breaks down certain foods. Uric acid can form sharp needle-like crystals that build up in the joints and cause pain. Uric acid crystals can also form inside the tubes that carry urine from the kidneys to the bladder. These crystals can turn into "kidney stones" that can cause pain and problems with the flow of urine. People with gout get sudden "flares" or attacks of severe pain, most often the big toe, ankle, or knee. Often the joint also turns red and swells. Usually, only 1 joint is affected, but some people have pain in more than 1 joint. Gout flares tend to happen more often during the night.  The pain from gout can be extreme. The pain and swelling are worst at the beginning of a gout flare. The symptoms then get better within a few days to weeks. It is not clear how the body "turns off" a gout flare.  Do not start any NEW preventative medicine until the gout has cleared completely. However, If you are already on Probenecid or Allopurinol for CHRONIC gout, you may continue taking this during an active flare up  I have prescribed Prednisone 40 mg daily for 7   HOME CARE Losing weight can help relieve gout. It's not clear that following a specific diet plan will help with gout symptoms but eating a balanced diet can help improve your overall health. It can also help you lose weight, if you are overweight. In general, a healthy diet includes plenty of fruits, vegetables, whole grains, and low-fat dairy products (labelled "low fat", skim, 2%). Avoid sugar sweetened  drinks (including sodas, tea, juice and juice blends, coffee drinks and sports drinks) Limit alcohol to 1-2 drinks of beer, spirits or wine daily these can make gout flares worse. Some people with gout also have other health problems, such as heart disease, high blood pressure, kidney disease, or obesity. If you have any of these issues, it's important to work with your doctor to manage them. This can help improve your overall health and might also help with your gout.  GET HELP RIGHT AWAY IF: Your symptoms persist after you have completed your treatment plan You develop severe diarrhea You develop abnormal sensations  You develop vomiting,   You develop weakness  You develop abdominal pain  FOLLOW UP WITH YOUR PRIMARY PROVIDER IF: If your symptoms do not improve within 10 days  MAKE SURE YOU  Understand these instructions. Will watch your condition. Will get help right away if you are not doing well or get worse.  Thank you for choosing an e-visit.  Your e-visit answers were reviewed by a board certified advanced clinical practitioner to complete your personal care plan. Depending upon the condition, your plan could have included both over the counter or prescription medications.  Please review your pharmacy choice. Make sure the pharmacy is open so you can pick up prescription now. If there is a problem, you may contact your provider through [employer] and have  the prescription routed to another pharmacy.  Your safety is important to [place]. If you have drug allergies check your prescription carefully.   For the next 24 hours you can use MyChart to ask questions about today's visit, request a non-urgent call back, or ask for a work or school excuse. You will get an email in the next two days asking about your experience. I hope that your e-visit has been valuable and will speed your recovery.   Meds ordered this encounter  Medications   predniSONE (DELTASONE) 20 MG tablet    Sig:  Take 2 tablets (40 mg total) by mouth daily with breakfast for 7 days.    Dispense:  14 tablet    Refill:  0    I spent approximately 5 minutes reviewing the patient's history, current symptoms and coordinating their care today.

## 2023-05-17 ENCOUNTER — Encounter (HOSPITAL_COMMUNITY): Payer: Self-pay | Admitting: Critical Care Medicine

## 2023-05-17 ENCOUNTER — Other Ambulatory Visit (HOSPITAL_COMMUNITY): Payer: 59

## 2023-05-17 ENCOUNTER — Inpatient Hospital Stay (HOSPITAL_COMMUNITY): Payer: Medicaid Other

## 2023-05-17 ENCOUNTER — Other Ambulatory Visit (HOSPITAL_COMMUNITY): Payer: Self-pay

## 2023-05-17 ENCOUNTER — Inpatient Hospital Stay (HOSPITAL_COMMUNITY)
Admission: EM | Admit: 2023-05-17 | Discharge: 2023-05-21 | DRG: 280 | Disposition: A | Discharge status: Home / Self Care | Payer: Medicaid Other | Attending: Internal Medicine | Admitting: Internal Medicine

## 2023-05-17 ENCOUNTER — Other Ambulatory Visit: Payer: Self-pay

## 2023-05-17 ENCOUNTER — Encounter (HOSPITAL_COMMUNITY): Payer: 59

## 2023-05-17 ENCOUNTER — Emergency Department (HOSPITAL_COMMUNITY): Payer: Medicaid Other

## 2023-05-17 DIAGNOSIS — I428 Other cardiomyopathies: Secondary | ICD-10-CM | POA: Diagnosis present

## 2023-05-17 DIAGNOSIS — E872 Acidosis, unspecified: Secondary | ICD-10-CM | POA: Diagnosis present

## 2023-05-17 DIAGNOSIS — R9431 Abnormal electrocardiogram [ECG] [EKG]: Secondary | ICD-10-CM

## 2023-05-17 DIAGNOSIS — I252 Old myocardial infarction: Secondary | ICD-10-CM

## 2023-05-17 DIAGNOSIS — I5041 Acute combined systolic (congestive) and diastolic (congestive) heart failure: Secondary | ICD-10-CM

## 2023-05-17 DIAGNOSIS — Z6830 Body mass index (BMI) 30.0-30.9, adult: Secondary | ICD-10-CM

## 2023-05-17 DIAGNOSIS — N186 End stage renal disease: Secondary | ICD-10-CM | POA: Diagnosis present

## 2023-05-17 DIAGNOSIS — Z885 Allergy status to narcotic agent status: Secondary | ICD-10-CM

## 2023-05-17 DIAGNOSIS — I2510 Atherosclerotic heart disease of native coronary artery without angina pectoris: Secondary | ICD-10-CM | POA: Diagnosis present

## 2023-05-17 DIAGNOSIS — I10 Essential (primary) hypertension: Secondary | ICD-10-CM

## 2023-05-17 DIAGNOSIS — M109 Gout, unspecified: Secondary | ICD-10-CM | POA: Diagnosis present

## 2023-05-17 DIAGNOSIS — J9601 Acute respiratory failure with hypoxia: Secondary | ICD-10-CM | POA: Diagnosis present

## 2023-05-17 DIAGNOSIS — R000 Tachycardia, unspecified: Secondary | ICD-10-CM | POA: Diagnosis present

## 2023-05-17 DIAGNOSIS — E43 Unspecified severe protein-calorie malnutrition: Secondary | ICD-10-CM | POA: Insufficient documentation

## 2023-05-17 DIAGNOSIS — I21A1 Myocardial infarction type 2: Secondary | ICD-10-CM | POA: Diagnosis present

## 2023-05-17 DIAGNOSIS — N179 Acute kidney failure, unspecified: Secondary | ICD-10-CM | POA: Diagnosis present

## 2023-05-17 DIAGNOSIS — Z992 Dependence on renal dialysis: Secondary | ICD-10-CM

## 2023-05-17 DIAGNOSIS — Z597 Insufficient social insurance and welfare support: Secondary | ICD-10-CM

## 2023-05-17 DIAGNOSIS — N2581 Secondary hyperparathyroidism of renal origin: Secondary | ICD-10-CM | POA: Diagnosis present

## 2023-05-17 DIAGNOSIS — Z7982 Long term (current) use of aspirin: Secondary | ICD-10-CM

## 2023-05-17 DIAGNOSIS — E785 Hyperlipidemia, unspecified: Secondary | ICD-10-CM | POA: Diagnosis present

## 2023-05-17 DIAGNOSIS — Z79899 Other long term (current) drug therapy: Secondary | ICD-10-CM

## 2023-05-17 DIAGNOSIS — I214 Non-ST elevation (NSTEMI) myocardial infarction: Secondary | ICD-10-CM | POA: Diagnosis not present

## 2023-05-17 DIAGNOSIS — Z538 Procedure and treatment not carried out for other reasons: Secondary | ICD-10-CM | POA: Diagnosis present

## 2023-05-17 DIAGNOSIS — I160 Hypertensive urgency: Secondary | ICD-10-CM | POA: Diagnosis present

## 2023-05-17 DIAGNOSIS — Z888 Allergy status to other drugs, medicaments and biological substances status: Secondary | ICD-10-CM

## 2023-05-17 DIAGNOSIS — N492 Inflammatory disorders of scrotum: Secondary | ICD-10-CM | POA: Insufficient documentation

## 2023-05-17 DIAGNOSIS — Z8673 Personal history of transient ischemic attack (TIA), and cerebral infarction without residual deficits: Secondary | ICD-10-CM

## 2023-05-17 DIAGNOSIS — I5023 Acute on chronic systolic (congestive) heart failure: Secondary | ICD-10-CM

## 2023-05-17 DIAGNOSIS — R7303 Prediabetes: Secondary | ICD-10-CM | POA: Diagnosis present

## 2023-05-17 DIAGNOSIS — Z8249 Family history of ischemic heart disease and other diseases of the circulatory system: Secondary | ICD-10-CM | POA: Diagnosis not present

## 2023-05-17 DIAGNOSIS — I169 Hypertensive crisis, unspecified: Secondary | ICD-10-CM

## 2023-05-17 DIAGNOSIS — E876 Hypokalemia: Secondary | ICD-10-CM | POA: Diagnosis present

## 2023-05-17 DIAGNOSIS — D631 Anemia in chronic kidney disease: Secondary | ICD-10-CM | POA: Diagnosis present

## 2023-05-17 DIAGNOSIS — Z7952 Long term (current) use of systemic steroids: Secondary | ICD-10-CM

## 2023-05-17 DIAGNOSIS — D72829 Elevated white blood cell count, unspecified: Secondary | ICD-10-CM

## 2023-05-17 DIAGNOSIS — Z823 Family history of stroke: Secondary | ICD-10-CM

## 2023-05-17 DIAGNOSIS — I132 Hypertensive heart and chronic kidney disease with heart failure and with stage 5 chronic kidney disease, or end stage renal disease: Principal | ICD-10-CM | POA: Diagnosis present

## 2023-05-17 DIAGNOSIS — I272 Pulmonary hypertension, unspecified: Secondary | ICD-10-CM | POA: Diagnosis present

## 2023-05-17 DIAGNOSIS — Z91148 Patient's other noncompliance with medication regimen for other reason: Secondary | ICD-10-CM

## 2023-05-17 DIAGNOSIS — Z5986 Financial insecurity: Secondary | ICD-10-CM

## 2023-05-17 DIAGNOSIS — E669 Obesity, unspecified: Secondary | ICD-10-CM | POA: Diagnosis present

## 2023-05-17 DIAGNOSIS — R7989 Other specified abnormal findings of blood chemistry: Secondary | ICD-10-CM

## 2023-05-17 DIAGNOSIS — I2584 Coronary atherosclerosis due to calcified coronary lesion: Secondary | ICD-10-CM | POA: Diagnosis present

## 2023-05-17 DIAGNOSIS — I5020 Unspecified systolic (congestive) heart failure: Secondary | ICD-10-CM

## 2023-05-17 DIAGNOSIS — K219 Gastro-esophageal reflux disease without esophagitis: Secondary | ICD-10-CM | POA: Diagnosis present

## 2023-05-17 DIAGNOSIS — R0609 Other forms of dyspnea: Secondary | ICD-10-CM

## 2023-05-17 DIAGNOSIS — Z91199 Patient's noncompliance with other medical treatment and regimen due to unspecified reason: Secondary | ICD-10-CM

## 2023-05-17 DIAGNOSIS — N185 Chronic kidney disease, stage 5: Secondary | ICD-10-CM

## 2023-05-17 HISTORY — PX: IR US GUIDE VASC ACCESS RIGHT: IMG2390

## 2023-05-17 HISTORY — DX: Follicular disorder, unspecified: L73.9

## 2023-05-17 HISTORY — DX: Unspecified systolic (congestive) heart failure: I50.20

## 2023-05-17 HISTORY — PX: IR FLUORO GUIDE CV LINE RIGHT: IMG2283

## 2023-05-17 LAB — COMPREHENSIVE METABOLIC PANEL
ALT: 14 U/L (ref 0–44)
ALT: 15 U/L (ref 0–44)
AST: 21 U/L (ref 15–41)
AST: 23 U/L (ref 15–41)
Albumin: 3.6 g/dL (ref 3.5–5.0)
Albumin: 3.3 g/dL — ABNORMAL LOW (ref 3.5–5.0)
Alkaline Phosphatase: 56 U/L (ref 38–126)
Alkaline Phosphatase: 57 U/L (ref 38–126)
Anion gap: 20 — ABNORMAL HIGH (ref 5–15)
Anion gap: 19 — ABNORMAL HIGH (ref 5–15)
BUN: 147 mg/dL — ABNORMAL HIGH (ref 6–20)
BUN: 150 mg/dL — ABNORMAL HIGH (ref 6–20)
CO2: 11 mmol/L — ABNORMAL LOW (ref 22–32)
CO2: 12 mmol/L — ABNORMAL LOW (ref 22–32)
Calcium: 7.6 mg/dL — ABNORMAL LOW (ref 8.9–10.3)
Calcium: 7.6 mg/dL — ABNORMAL LOW (ref 8.9–10.3)
Chloride: 106 mmol/L (ref 98–111)
Chloride: 107 mmol/L (ref 98–111)
Creatinine, Ser: 21.61 mg/dL — ABNORMAL HIGH (ref 0.61–1.24)
Creatinine, Ser: 21.88 mg/dL — ABNORMAL HIGH (ref 0.61–1.24)
GFR, Estimated: 2 mL/min — ABNORMAL LOW (ref >60)
GFR, Estimated: 2 mL/min — ABNORMAL LOW (ref >60)
Glucose, Bld: 100 mg/dL — ABNORMAL HIGH (ref 70–99)
Glucose, Bld: 86 mg/dL (ref 70–99)
Potassium: 4 mmol/L (ref 3.5–5.1)
Potassium: 4.3 mmol/L (ref 3.5–5.1)
Sodium: 137 mmol/L (ref 135–145)
Sodium: 138 mmol/L (ref 135–145)
Total Bilirubin: 0.4 mg/dL (ref 0.3–1.2)
Total Bilirubin: 0.4 mg/dL (ref 0.3–1.2)
Total Protein: 7 g/dL (ref 6.5–8.1)
Total Protein: 7 g/dL (ref 6.5–8.1)

## 2023-05-17 LAB — HEPATITIS B SURFACE ANTIGEN: Hepatitis B Surface Ag: NONREACTIVE

## 2023-05-17 LAB — RAPID URINE DRUG SCREEN, HOSP PERFORMED
Amphetamines: NOT DETECTED
Barbiturates: NOT DETECTED
Benzodiazepines: NOT DETECTED
Cocaine: NOT DETECTED
Opiates: NOT DETECTED
Tetrahydrocannabinol: NOT DETECTED

## 2023-05-17 LAB — TYPE AND SCREEN
ABO/RH(D): AB POS
Antibody Screen: NEGATIVE

## 2023-05-17 LAB — LACTIC ACID, PLASMA
Lactic Acid, Venous: 1.2 mmol/L (ref 0.5–1.9)
Lactic Acid, Venous: 1.5 mmol/L (ref 0.5–1.9)

## 2023-05-17 LAB — URINALYSIS, ROUTINE W REFLEX MICROSCOPIC
Bilirubin Urine: NEGATIVE
Glucose, UA: 50 mg/dL — AB
Ketones, ur: NEGATIVE mg/dL
Leukocytes,Ua: NEGATIVE
Nitrite: NEGATIVE
Protein, ur: 100 mg/dL — AB
Specific Gravity, Urine: 1.01 (ref 1.005–1.030)
pH: 5 (ref 5.0–8.0)

## 2023-05-17 LAB — CBC WITH DIFFERENTIAL/PLATELET
Abs Immature Granulocytes: 0.16 10*3/uL — ABNORMAL HIGH (ref 0.00–0.07)
Basophils Absolute: 0 10*3/uL (ref 0.0–0.1)
Basophils Relative: 0 %
Eosinophils Absolute: 0 10*3/uL (ref 0.0–0.5)
Eosinophils Relative: 0 %
HCT: 32.7 % — ABNORMAL LOW (ref 39.0–52.0)
Hemoglobin: 10.7 g/dL — ABNORMAL LOW (ref 13.0–17.0)
Immature Granulocytes: 1 %
Lymphocytes Relative: 14 %
Lymphs Abs: 2.3 10*3/uL (ref 0.7–4.0)
MCH: 30.8 pg (ref 26.0–34.0)
MCHC: 32.7 g/dL (ref 30.0–36.0)
MCV: 94.2 fL (ref 80.0–100.0)
Monocytes Absolute: 0.8 10*3/uL (ref 0.1–1.0)
Monocytes Relative: 5 %
Neutro Abs: 12.8 10*3/uL — ABNORMAL HIGH (ref 1.7–7.7)
Neutrophils Relative %: 80 %
Platelets: 294 10*3/uL (ref 150–400)
RBC: 3.47 MIL/uL — ABNORMAL LOW (ref 4.22–5.81)
RDW: 14.5 % (ref 11.5–15.5)
WBC: 16.1 10*3/uL — ABNORMAL HIGH (ref 4.0–10.5)
nRBC: 0.1 % (ref 0.0–0.2)

## 2023-05-17 LAB — LIPASE, BLOOD: Lipase: 116 U/L — ABNORMAL HIGH (ref 11–51)

## 2023-05-17 LAB — D-DIMER, QUANTITATIVE: D-Dimer, Quant: 2.14 ug{FEU}/mL — ABNORMAL HIGH (ref 0.00–0.50)

## 2023-05-17 LAB — HEMOGLOBIN A1C
Hgb A1c MFr Bld: 5.8 % — ABNORMAL HIGH (ref 4.8–5.6)
Mean Plasma Glucose: 119.76 mg/dL

## 2023-05-17 LAB — IRON AND TIBC
Iron: 55 ug/dL (ref 45–182)
Saturation Ratios: 35 % (ref 17.9–39.5)
TIBC: 156 ug/dL — ABNORMAL LOW (ref 250–450)
UIBC: 101 ug/dL

## 2023-05-17 LAB — GLUCOSE, CAPILLARY
Glucose-Capillary: 110 mg/dL — ABNORMAL HIGH (ref 70–99)
Glucose-Capillary: 65 mg/dL — ABNORMAL LOW (ref 70–99)
Glucose-Capillary: 65 mg/dL — ABNORMAL LOW (ref 70–99)
Glucose-Capillary: 78 mg/dL (ref 70–99)
Glucose-Capillary: 86 mg/dL (ref 70–99)
Glucose-Capillary: 89 mg/dL (ref 70–99)

## 2023-05-17 LAB — BLOOD GAS, VENOUS
Acid-base deficit: 13.6 mmol/L — ABNORMAL HIGH (ref 0.0–2.0)
Bicarbonate: 11.5 mmol/L — ABNORMAL LOW (ref 20.0–28.0)
Drawn by: 53361
O2 Saturation: 91.3 %
Patient temperature: 36.7
pCO2, Ven: 25 mmHg — ABNORMAL LOW (ref 44–60)
pH, Ven: 7.27 (ref 7.25–7.43)
pO2, Ven: 59 mmHg — ABNORMAL HIGH (ref 32–45)

## 2023-05-17 LAB — CBC
HCT: 31.5 % — ABNORMAL LOW (ref 39.0–52.0)
Hemoglobin: 10.4 g/dL — ABNORMAL LOW (ref 13.0–17.0)
MCH: 30.5 pg (ref 26.0–34.0)
MCHC: 33 g/dL (ref 30.0–36.0)
MCV: 92.4 fL (ref 80.0–100.0)
Platelets: 273 10*3/uL (ref 150–400)
RBC: 3.41 MIL/uL — ABNORMAL LOW (ref 4.22–5.81)
RDW: 14.4 % (ref 11.5–15.5)
WBC: 18 10*3/uL — ABNORMAL HIGH (ref 4.0–10.5)
nRBC: 0.1 % (ref 0.0–0.2)

## 2023-05-17 LAB — HEPARIN LEVEL (UNFRACTIONATED)
Heparin Unfractionated: 0.12 [IU]/mL — ABNORMAL LOW (ref 0.30–0.70)
Heparin Unfractionated: 0.11 [IU]/mL — ABNORMAL LOW (ref 0.30–0.70)

## 2023-05-17 LAB — MAGNESIUM
Magnesium: 1.6 mg/dL — ABNORMAL LOW (ref 1.7–2.4)
Magnesium: 2 mg/dL (ref 1.7–2.4)

## 2023-05-17 LAB — ECHOCARDIOGRAM COMPLETE
Est EF: 25
Height: 71 in
MV M vel: 3.71 m/s
MV Peak grad: 55.1 mmHg
S' Lateral: 5.25 cm
Weight: 3520 oz

## 2023-05-17 LAB — BRAIN NATRIURETIC PEPTIDE: B Natriuretic Peptide: 925 pg/mL — ABNORMAL HIGH (ref 0.0–100.0)

## 2023-05-17 LAB — TROPONIN I (HIGH SENSITIVITY)
Troponin I (High Sensitivity): 4584 ng/L (ref <18)
Troponin I (High Sensitivity): 5699 ng/L (ref <18)
Troponin I (High Sensitivity): 5441 ng/L (ref <18)

## 2023-05-17 LAB — MRSA NEXT GEN BY PCR, NASAL: MRSA by PCR Next Gen: NOT DETECTED

## 2023-05-17 LAB — HIV ANTIBODY (ROUTINE TESTING W REFLEX): HIV Screen 4th Generation wRfx: NONREACTIVE

## 2023-05-17 LAB — PROTIME-INR
INR: 1.3 — ABNORMAL HIGH (ref 0.8–1.2)
Prothrombin Time: 16 seconds — ABNORMAL HIGH (ref 11.4–15.2)

## 2023-05-17 LAB — FERRITIN: Ferritin: 47 ng/mL (ref 24–336)

## 2023-05-17 LAB — PHOSPHORUS
Phosphorus: 10.5 mg/dL — ABNORMAL HIGH (ref 2.5–4.6)
Phosphorus: 10.2 mg/dL — ABNORMAL HIGH (ref 2.5–4.6)

## 2023-05-17 LAB — ABO/RH: ABO/RH(D): AB POS

## 2023-05-17 MED ORDER — SODIUM CHLORIDE 0.9 % IV SOLN: INTRAVENOUS | Status: DC | PRN

## 2023-05-17 MED ORDER — POLYETHYLENE GLYCOL 3350 17 G PO PACK: 17.0000 g | PACK | Freq: Every day | ORAL | Status: DC | PRN

## 2023-05-17 MED ORDER — FUROSEMIDE 10 MG/ML IJ SOLN
160.0000 mg | Freq: Once | INTRAVENOUS | Status: AC
  Administered 2023-05-17: 160 mg via INTRAVENOUS
  Filled 2023-05-17: qty 2

## 2023-05-17 MED ORDER — NITROGLYCERIN IN D5W 200-5 MCG/ML-% IV SOLN
0.0000 ug/min | INTRAVENOUS | Status: DC
  Administered 2023-05-17: 5 ug/min via INTRAVENOUS
  Administered 2023-05-18: 110 ug/min via INTRAVENOUS
  Administered 2023-05-18: 155 ug/min via INTRAVENOUS
  Administered 2023-05-18: 160 ug/min via INTRAVENOUS
  Administered 2023-05-18: 135 ug/min via INTRAVENOUS
  Administered 2023-05-19: 90 ug/min via INTRAVENOUS
  Filled 2023-05-17 (×5): qty 250

## 2023-05-17 MED ORDER — DOCUSATE SODIUM 100 MG PO CAPS: 100.0000 mg | ORAL_CAPSULE | Freq: Two times a day (BID) | ORAL | Status: DC | PRN

## 2023-05-17 MED ORDER — SODIUM CHLORIDE 0.9 % IV SOLN
2.0000 g | Freq: Once | INTRAVENOUS | Status: AC
  Administered 2023-05-17: 2 g via INTRAVENOUS
  Filled 2023-05-17: qty 20

## 2023-05-17 MED ORDER — HEPARIN BOLUS VIA INFUSION
4000.0000 [IU] | Freq: Once | INTRAVENOUS | Status: AC
  Administered 2023-05-17: 4000 [IU] via INTRAVENOUS

## 2023-05-17 MED ORDER — OXYCODONE-ACETAMINOPHEN 5-325 MG PO TABS
2.0000 | ORAL_TABLET | Freq: Once | ORAL | Status: AC
  Administered 2023-05-17: 2 via ORAL
  Filled 2023-05-17: qty 2

## 2023-05-17 MED ORDER — IOHEXOL 350 MG/ML SOLN: 75.0000 mL | Freq: Once | INTRAVENOUS | Status: DC | PRN

## 2023-05-17 MED ORDER — ASPIRIN 81 MG PO CHEW
81.0000 mg | CHEWABLE_TABLET | Freq: Every day | ORAL | Status: DC
  Administered 2023-05-19 – 2023-05-21 (×3): 81 mg via ORAL
  Filled 2023-05-17 (×3): qty 1

## 2023-05-17 MED ORDER — ATORVASTATIN CALCIUM 10 MG PO TABS
20.0000 mg | ORAL_TABLET | Freq: Every day | ORAL | Status: DC
  Administered 2023-05-18 – 2023-05-21 (×4): 20 mg via ORAL
  Filled 2023-05-17 (×4): qty 2

## 2023-05-17 MED ORDER — ORAL CARE MOUTH RINSE: 15.0000 mL | OROMUCOSAL | Status: DC | PRN

## 2023-05-17 MED ORDER — LIDOCAINE HCL (PF) 2 % IJ SOLN
INTRAMUSCULAR | Status: AC
  Administered 2023-05-17: 10 mL
  Filled 2023-05-17: qty 10

## 2023-05-17 MED ORDER — DEXTROSE 50 % IV SOLN
INTRAVENOUS | Status: AC
  Filled 2023-05-17: qty 50

## 2023-05-17 MED ORDER — LIDOCAINE-EPINEPHRINE 1 %-1:100000 IJ SOLN
INTRAMUSCULAR | Status: AC
  Filled 2023-05-17: qty 1

## 2023-05-17 MED ORDER — ASPIRIN 81 MG PO CHEW
324.0000 mg | CHEWABLE_TABLET | ORAL | Status: AC
  Administered 2023-05-17: 324 mg via ORAL
  Filled 2023-05-17: qty 4

## 2023-05-17 MED ORDER — FAMOTIDINE IN NACL 20-0.9 MG/50ML-% IV SOLN
20.0000 mg | Freq: Once | INTRAVENOUS | Status: AC
  Administered 2023-05-17: 20 mg via INTRAVENOUS
  Filled 2023-05-17: qty 50

## 2023-05-17 MED ORDER — MIDAZOLAM HCL 2 MG/2ML IJ SOLN
INTRAMUSCULAR | Status: AC
  Filled 2023-05-17: qty 2

## 2023-05-17 MED ORDER — LIDOCAINE HCL (PF) 2 % IJ SOLN: 10.0000 mL | Freq: Once | INTRAMUSCULAR | Status: DC

## 2023-05-17 MED ORDER — LIDOCAINE HCL (PF) 2 % IJ SOLN
10.0000 mL | Freq: Once | INTRAMUSCULAR | Status: AC
  Administered 2023-05-17: 10 mL

## 2023-05-17 MED ORDER — FENTANYL CITRATE (PF) 100 MCG/2ML IJ SOLN
INTRAMUSCULAR | Status: AC | PRN
Start: 2023-05-17 — End: 2023-05-17
  Administered 2023-05-17: 50 ug via INTRAVENOUS

## 2023-05-17 MED ORDER — PIPERACILLIN-TAZOBACTAM IN DEX 2-0.25 GM/50ML IV SOLN
2.2500 g | Freq: Three times a day (TID) | INTRAVENOUS | Status: DC
  Administered 2023-05-17 – 2023-05-21 (×10): 2.25 g via INTRAVENOUS
  Filled 2023-05-17 (×14): qty 50

## 2023-05-17 MED ORDER — MAGNESIUM SULFATE 2 GM/50ML IV SOLN
INTRAVENOUS | Status: AC
  Filled 2023-05-17: qty 50

## 2023-05-17 MED ORDER — NITROGLYCERIN IN D5W 200-5 MCG/ML-% IV SOLN
INTRAVENOUS | Status: AC
  Filled 2023-05-17: qty 250

## 2023-05-17 MED ORDER — HEPARIN (PORCINE) 25000 UT/250ML-% IV SOLN
1450.0000 [IU]/h | INTRAVENOUS | Status: DC
  Administered 2023-05-17: 1300 [IU]/h via INTRAVENOUS
  Administered 2023-05-18: 1450 [IU]/h via INTRAVENOUS
  Filled 2023-05-17 (×2): qty 250

## 2023-05-17 MED ORDER — HEPARIN SODIUM (PORCINE) 1000 UNIT/ML IJ SOLN
INTRAMUSCULAR | Status: AC
  Filled 2023-05-17: qty 10

## 2023-05-17 MED ORDER — SODIUM BICARBONATE 650 MG PO TABS
1300.0000 mg | ORAL_TABLET | Freq: Three times a day (TID) | ORAL | Status: DC
  Administered 2023-05-17 (×2): 1300 mg via ORAL
  Filled 2023-05-17 (×2): qty 2

## 2023-05-17 MED ORDER — MIDAZOLAM HCL 2 MG/2ML IJ SOLN
INTRAMUSCULAR | Status: AC | PRN
  Administered 2023-05-17: 1 mg via INTRAVENOUS

## 2023-05-17 MED ORDER — FENTANYL CITRATE (PF) 100 MCG/2ML IJ SOLN
INTRAMUSCULAR | Status: AC
  Filled 2023-05-17: qty 2

## 2023-05-17 MED ORDER — PERFLUTREN LIPID MICROSPHERE
1.0000 mL | INTRAVENOUS | Status: AC | PRN
  Administered 2023-05-17: 2 mL via INTRAVENOUS

## 2023-05-17 MED ORDER — HEPARIN SODIUM (PORCINE) 1000 UNIT/ML IJ SOLN
10000.0000 [IU] | Freq: Once | INTRAMUSCULAR | Status: AC
  Administered 2023-05-17: 3800 [IU]
  Filled 2023-05-17: qty 10

## 2023-05-17 MED ORDER — DEXTROSE 50 % IV SOLN
12.5000 g | Freq: Once | INTRAVENOUS | Status: AC
  Administered 2023-05-17: 12.5 g via INTRAVENOUS

## 2023-05-17 MED ORDER — CHLORHEXIDINE GLUCONATE CLOTH 2 % EX PADS
6.0000 | MEDICATED_PAD | Freq: Every day | CUTANEOUS | Status: DC
  Administered 2023-05-17 – 2023-05-18 (×2): 6 via TOPICAL

## 2023-05-17 MED ORDER — MAGNESIUM SULFATE 2 GM/50ML IV SOLN
2.0000 g | Freq: Once | INTRAVENOUS | Status: AC
  Administered 2023-05-17: 2 g via INTRAVENOUS

## 2023-05-17 MED ORDER — LIDOCAINE-EPINEPHRINE 1 %-1:100000 IJ SOLN
20.0000 mL | Freq: Once | INTRAMUSCULAR | Status: AC
  Administered 2023-05-17: 20 mL via INTRADERMAL

## 2023-05-17 MED ORDER — IOHEXOL 350 MG/ML SOLN
75.0000 mL | Freq: Once | INTRAVENOUS | Status: AC | PRN
  Administered 2023-05-17: 75 mL via INTRAVENOUS

## 2023-05-17 MED ORDER — LORAZEPAM 2 MG/ML IJ SOLN
INTRAMUSCULAR | Status: AC
  Administered 2023-05-17: 1 mg via INTRAVENOUS
  Filled 2023-05-17: qty 1

## 2023-05-17 MED ORDER — MAGNESIUM SULFATE 2 GM/50ML IV SOLN
2.0000 g | Freq: Once | INTRAVENOUS | Status: AC
  Administered 2023-05-17: 2 g via INTRAVENOUS
  Filled 2023-05-17: qty 50

## 2023-05-17 MED ORDER — INSULIN ASPART 100 UNIT/ML IJ SOLN: 0.0000 [IU] | INTRAMUSCULAR | Status: DC

## 2023-05-17 NOTE — H&P (Signed)
NAME:  Zachary Lynch, MRN:  [id number], DOB:  06-23-1986, LOS: 0 ADMISSION DATE:  05/17/2023 CONSULTATION DATE:  05/17/2023 REFERRING MD:  Eloise Harman - EDP ([facility]) CHIEF COMPLAINT:  NSTEMI, HD needs, scrotal infection   History of Present Illness:  37 year old man who presented to [facility] 8/27 as a transfer from [facility] for NSTEMI and need for new HD start. 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  Presented to [facility] ED 8/27AM for nausea/vomiting, SOB/dyspnea x 1-2 days. Reported he has unfortunately had financial issues with obtaining his antihypertensive medications and has been noncompliant as a result of this; he was also concerned about possible groin abscess with drainage. Denies fever/chills, endorses CP/SOB. Endorses nausea/vomiting, no diarrhea, +history of elevated lipase, no known gallbladder issues/history of pancreatitis. No significant EtOH use. +Weight loss 60lb over several months, unintentional. +Groin swelling/drainage.   On ED arrival, patient was afebrile, tachycardic to 108, hypertensive to 168/138, RR 29, SpO2 94%. Scrotal abscess was noted with +drainage (2 x 2cm). Labs were notable for WBC 18, Hgb 10.4, Plt 273. INR 1.3. Na 137, K 4.0, CO2 11, BUN/Cr 147/21.61. LFTs WNL. BNP 925. Trop 4584 > 5699 > 5441. Concern for NSTEMI. Cardiology [facility]) consulted with recommendation for transfer to [facility] for further cardiac care, HD initiation and management of scrotal abscess.  PCCM consulted for ICU admission/transfer.  Pertinent Medical History:   Past Medical History:  Diagnosis Date   Arthritis    CKD stage 4 secondary to hypertension (HCC) 03/28/2017   Elevated blood uric acid level    Essential hypertension    Folliculitis    Managed with doxycycline, topical  clindamycin/benzoyl peroxide   GERD (gastroesophageal reflux disease)    Gout    HFrEF (heart failure with reduced ejection fraction) (HCC)    Echo 2019 with EF 30-35%, hypokinesis   History of cardiomyopathy    LVEF 15% in 2010 - evaluated by [facility] and Dr. Graciela Husbands   History of stroke 2010   Right MCA distribution infarct, felt to be possibly embolic in the setting of cardiomyopathy - placed on Coumadin at that time   Hyperlipidemia    Nonischemic cardiomyopathy (HCC) 03/28/2017   Obesity    Prediabetes 05/03/2017   Significant Hospital Events: Including procedures, antibiotic start and stop dates in addition to other pertinent events   8/27 - Presented to [facility] for nausea/vomiting, SOB/dyspnea. NSTEMI with trops > 5K. Cardiology recommended transfer to [facility] for ?heart cath. Scrotal abscess with drainage, no c/f Fournier's. Likely new HD start for volume management. PCCM accepted for ICU admission/transfer.  Interim History / Subjective:  Gets dyspneic and coughing spell when laying flat. No chest pain and has not had any chest pain. Intermittent nausea and vomiting, none currently.  Objective:  Blood pressure (!) 159/121, pulse (!) 119, temperature 98.1 F (36.7 C), temperature source Oral, resp. rate (!) 33, height 5\' 11"  (1.803 m), weight 99.8 kg, SpO2 90%.        Intake/Output Summary (Last 24 hours) at 05/17/2023 1037 Last data filed at 05/17/2023 0936 Gross per 24 hour  Intake 50 ml  Output --  Net 50 ml   Filed Weights   05/17/23 0448  Weight: 99.8 kg    Physical Examination: General: Young adult male, resting in bed, in NAD. Neuro: A&O x  3, no deficits. HEENT: /AT. Sclerae anicteric. EOMI. Cardiovascular: Tachy, regular, no M/R/G.  Lungs: Respirations even and unlabored.  CTA bilaterally, No W/R/R. Abdomen: BS x 4, soft, NT/ND.  Musculoskeletal: No gross deformities, no edema.  Skin: Intact, warm, no rashes.   Assessment & Plan:   NSTEMI - Troponins elevated  (4584 > 5699 > 5441) with concern for NSTEMI; EKG without ischemic changes. Prolonged QTc. Hx NICM (EF30-35% 2019), HTN, HLD. - Cardiology consulted, appreciate the assistance. - Continue Heparin gtt. - Defer further troponin checks as starting to downtrend (also clearance affected with ESRD). - Assess echo. - Will need L/RHCbut will wait until after 1 or 2 sessions of HD for volume removal first. Hopeful for tunneled catheter placement by IR today and HD thereafter, awaiting to see if IR can accommodate given the short notice. - 2g Mag. - Keep Mag > 2, K > 4. - Will need to re-establish with cards/heart failure.  Hypertensive urgency. - Start nitroglycerin gtt. - Lasix 160mg  x 1.  Dyspnea - Related to above + CHF + volume/ESRD. Doing well on room air. - Supportive care. - Lasix 160mg  x 1. - Volume removal with HD as soon as possible.  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 AGMA - 2/2 above. Hypomagnesemia. - Nephrology consulted, appreciate the assistance. - IR consulted for assistance with tunneled catheter placement hopefully today so that we can arrange for HD today. - Vascular surgery consulted for fistula placement, appreciate the assistance. - 2g Mag. - Sodium Bicarb pills. - Follow BMP.  Nausea/vomiting - currently controlled. - Ativan PRN given prolonged QTc.  Scrotal abscess - currently draining purulent material. Attempted I&D in ED but unable to lie flat. No overt signs of gas tracking/nec fasc/etc. - Get pelvic CT to ensure no nec fasc/fournier's/etc. - Start Zosyn empirically. - Will need surgical consult for I&D.  Best Practice: (right click and "Reselect all SmartList Selections" daily)   Diet/type: NPO DVT prophylaxis: systemic heparin GI prophylaxis: N/A Lines: N/A - tunneled HD cath pending Foley:  N/A Code Status:  full code Last date of multidisciplinary goals of care discussion: None yet.  Labs:  CBC: Recent Labs  Lab 05/17/23 0506  WBC  18.0*  HGB 10.4*  HCT 31.5*  MCV 92.4  PLT 273   Basic Metabolic Panel: Recent Labs  Lab 05/17/23 0506 05/17/23 0802  NA 137  --   K 4.0  --   CL 106  --   CO2 11*  --   GLUCOSE 100*  --   BUN 147*  --   CREATININE 21.61*  --   CALCIUM 7.6*  --   MG  --  1.6*  PHOS  --  10.5*   GFR: Estimated Creatinine Clearance: 5.5 mL/min (A) (by C-G formula based on SCr of 21.61 mg/dL (H)). Recent Labs  Lab 05/17/23 0506  WBC 18.0*   Liver Function Tests: Recent Labs  Lab 05/17/23 0506  AST 21  ALT 14  ALKPHOS 56  BILITOT 0.4  PROT 7.0  ALBUMIN 3.6   Recent Labs  Lab 05/17/23 0506  LIPASE 116*   No results for input(s): "AMMONIA" in the last 168 hours.  ABG:    Component Value Date/Time   PHART 7.446 09/28/2008 0056   PCO2ART 30.8 (L) 09/28/2008 0056   PO2ART 62.4 (L) 09/28/2008 0056   HCO3 11.5 (L) 05/17/2023 0802   TCO2 21.8 09/28/2008 0056   ACIDBASEDEF 13.6 (H) 05/17/2023 0802   O2SAT 91.3 05/17/2023 0802  Coagulation Profile: Recent Labs  Lab 05/17/23 0802  INR 1.3*   Cardiac Enzymes: No results for input(s): "CKTOTAL", "CKMB", "CKMBINDEX", "TROPONINI" in the last 168 hours.  HbA1C: Hgb A1c MFr Bld  Date/Time Value Ref Range Status  06/03/2022 12:00 AM 5.3 <5.7 % of total Hgb Final    Comment:    For the purpose of screening for the presence of diabetes: . <5.7%       Consistent with the absence of diabetes 5.7-6.4%    Consistent with increased risk for diabetes             (prediabetes) > or =6.5%  Consistent with diabetes . This assay result is consistent with a decreased risk of diabetes. . Currently, no consensus exists regarding use of hemoglobin A1c for diagnosis of diabetes in children. . According to American Diabetes Association (ADA) guidelines, hemoglobin A1c <7.0% represents optimal control in non-pregnant diabetic patients. Different metrics may apply to specific patient populations.  Standards of Medical Care in  Diabetes(ADA). Marland Kitchen   11/16/2021 12:00 AM 5.4 <5.7 % of total Hgb Final    Comment:    For the purpose of screening for the presence of diabetes: . <5.7%       Consistent with the absence of diabetes 5.7-6.4%    Consistent with increased risk for diabetes             (prediabetes) > or =6.5%  Consistent with diabetes . This assay result is consistent with a decreased risk of diabetes. . Currently, no consensus exists regarding use of hemoglobin A1c for diagnosis of diabetes in children. . According to American Diabetes Association (ADA) guidelines, hemoglobin A1c <7.0% represents optimal control in non-pregnant diabetic patients. Different metrics may apply to specific patient populations.  Standards of Medical Care in Diabetes(ADA). .    CBG: No results for input(s): "GLUCAP" in the last 168 hours.  Review of Systems:   All negative; except for those that are bolded, which indicate positives.  Constitutional: weight loss, weight gain, night sweats, fevers, chills, fatigue, weakness.  HEENT: headaches, sore throat, sneezing, nasal congestion, post nasal drip, difficulty swallowing, tooth/dental problems, visual complaints, visual changes, ear aches. Neuro: difficulty with speech, weakness, numbness, ataxia. CV:  chest pain, orthopnea, PND, swelling in lower extremities, dizziness, palpitations, syncope.  Resp: cough, hemoptysis, dyspnea, wheezing. GI: heartburn, indigestion, abdominal pain, nausea, vomiting, diarrhea, constipation, change in bowel habits, loss of appetite, hematemesis, melena, hematochezia.  GU: dysuria, change in color of urine, urgency or frequency, flank pain, hematuria. MSK: joint pain or swelling, decreased range of motion. Psych: change in mood or affect, depression, anxiety, suicidal ideations, homicidal ideations. Skin: rash, itching, bruising.   Past Medical History:  He,  has a past medical history of Arthritis, CKD stage 4 secondary to  hypertension (HCC) (03/28/2017), Elevated blood uric acid level, Essential hypertension, Folliculitis, GERD (gastroesophageal reflux disease), Gout, HFrEF (heart failure with reduced ejection fraction) (HCC), History of cardiomyopathy, History of stroke (2010), Hyperlipidemia, Nonischemic cardiomyopathy (HCC) (03/28/2017), Obesity, and Prediabetes (05/03/2017).   Surgical History:  No past surgical history on file.   Social History:   reports that he has never smoked. He has never used smokeless tobacco. He reports that he does not drink alcohol and does not use drugs.   Family History:  His family history includes Heart attack in his mother; Hypertension in his father and mother; Stroke in his cousin.   Allergies: Allergies  Allergen Reactions   Albuterol Other (See Comments)  Reaction is unknown. Allergy was entered post Stroke "caused him to have a stroke"     Tramadol Nausea Only    Made patient disoriented   Home Medications: Prior to Admission medications   Medication Sig Start Date End Date Taking? Authorizing Provider  allopurinol (ZYLOPRIM) 100 MG tablet Take 2 tablets (200 mg total) by mouth daily. 01/14/23   [name], NP  amLODipine (NORVASC) 10 MG tablet Take 1 tablet (10 mg total) by mouth daily. 01/14/23   [name], NP  aspirin (ASPIRIN LOW DOSE) 81 MG EC tablet Take 1 tablet (81 mg total) by mouth daily. 12/15/21   [name], NP  atorvastatin (LIPITOR) 20 MG tablet Take 1 tablet (20 mg total) by mouth daily. 12/15/21   [name], NP  carvedilol (COREG) 25 MG tablet Take 2 tablets (50 mg total) by mouth 2 (two) times daily with a meal. 01/14/23   [name], NP  Clindamycin-Benzoyl Per, Refr, gel Apply 1 Application topically 2 (two) times daily. 01/14/23   [name], NP  doxazosin (CARDURA) 2 MG tablet Take 1 tablet (2 mg total) by mouth at bedtime. 12/15/21   [name], NP  famotidine (PEPCID) 40 MG tablet Take 1 tablet (40 mg total) by mouth daily. 01/14/23    [name], NP  ofloxacin (OCUFLOX) 0.3 % ophthalmic solution Place 1 drop four times daily in the affected eye for 7 days. If symptoms develop, okay to use in both eyes. 04/13/23   [name], NP  pantoprazole (PROTONIX) 40 MG tablet Take 1 tablet (40 mg total) by mouth daily. 01/14/23   [name], NP  predniSONE (DELTASONE) 20 MG tablet Take 2 tablets (40 mg total) by mouth daily with breakfast. 05/12/23   [name], NP  predniSONE (DELTASONE) 20 MG tablet Take 2 tablets (40 mg total) by mouth daily with breakfast for 7 days. 05/12/23 05/19/23  Viviano Simas, FNP   Critical care time: 50 minutes.     Rutherford Guys, PA - C  Pulmonary & Critical Care Medicine For pager details, please see AMION or use Epic chat  After 1900, please call [facility] for cross coverage needs 05/17/2023, 1:21 PM

## 2023-05-17 NOTE — ED Notes (Signed)
Pt reports being out of his blood pressure medications for a while due to financial obligations. Reports have productive cough causing him to vomit. Also reports potentially having an abscess in his groin that has started draining today.  Denies chest pain

## 2023-05-17 NOTE — Consult Note (Signed)
Zachary Lynch Mar 25, 1986  [id number].    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  When he was in the ED he reported a fluctuant area on his scrotum. An I&D was attempted, but the patient became SOB and the procedure was aborted.  He reports that he has had fluctuant areas in his groin for several weeks.  They typically drain spontaneously.  WBC 16.  He is AF and HDS.   ROS: Review of Systems  Constitutional: Negative.   HENT: Negative.    Eyes: Negative.   Respiratory:  Positive for shortness of breath.   Cardiovascular:  Positive for chest pain and palpitations.  Gastrointestinal: Negative.   Genitourinary: Negative.   Musculoskeletal: Negative.   Skin: Negative.   Neurological: Negative.   Endo/Heme/Allergies: Negative.   Psychiatric/Behavioral: Negative.      Family History  Problem Relation Age of Onset   Stroke Cousin    Hypertension Mother    Heart attack Mother        Died age 55   Hypertension Father     Past Medical History:  Diagnosis Date   Arthritis    CKD stage 4 secondary to hypertension (HCC) 03/28/2017   Elevated blood uric acid level    Essential hypertension    Folliculitis    Managed with doxycycline, topical clindamycin/benzoyl peroxide   GERD (gastroesophageal reflux disease)    Gout    HFrEF (heart failure with reduced ejection fraction) (HCC)    Echo 2019 with EF 30-35%, hypokinesis   History of cardiomyopathy    LVEF 15% in 2010 - evaluated by [facility] and Dr. Graciela Husbands   History of stroke 2010   Right MCA distribution  infarct, felt to be possibly embolic in the setting of cardiomyopathy - placed on Coumadin at that time   Hyperlipidemia    Nonischemic cardiomyopathy (HCC) 03/28/2017   Obesity    Prediabetes 05/03/2017    History reviewed. No pertinent surgical history.  Social History:  reports that he has never smoked. He has never used smokeless tobacco. He reports that he does not drink alcohol and does not use drugs.  Allergies:  Allergies  Allergen Reactions   Albuterol Other (See Comments)    Reaction is unknown. Allergy was entered post Stroke "caused him to have a stroke"     Tramadol Nausea Only    Made patient disoriented    Medications Prior to Admission  Medication Sig Dispense Refill   allopurinol (ZYLOPRIM) 100 MG tablet Take 2 tablets (200 mg total) by mouth daily. 180 tablet 0   amLODipine (NORVASC) 10 MG tablet Take 1 tablet (10 mg total) by mouth daily. 90 tablet 1   aspirin (ASPIRIN LOW DOSE) 81 MG EC tablet Take 1 tablet (81 mg total) by mouth daily. 90 tablet 1   atorvastatin (LIPITOR) 20 MG tablet Take 1 tablet (20 mg total) by mouth daily. 90 tablet 1   carvedilol (COREG) 25 MG tablet Take 2 tablets (50 mg total) by mouth 2 (two) times daily with a meal. 360 tablet 1   Clindamycin-Benzoyl Per, Refr, gel  Apply 1 Application topically 2 (two) times daily. 45 g 11   doxazosin (CARDURA) 2 MG tablet Take 1 tablet (2 mg total) by mouth at bedtime. 90 tablet 1   famotidine (PEPCID) 40 MG tablet Take 1 tablet (40 mg total) by mouth daily. 90 tablet 1   ofloxacin (OCUFLOX) 0.3 % ophthalmic solution Place 1 drop four times daily in the affected eye for 7 days. If symptoms develop, okay to use in both eyes. 5 mL 0   pantoprazole (PROTONIX) 40 MG tablet Take 1 tablet (40 mg total) by mouth daily. 90 tablet 1   predniSONE (DELTASONE) 20 MG tablet Take 2 tablets (40 mg total) by mouth daily with breakfast. 10 tablet 0   predniSONE (DELTASONE) 20 MG tablet Take 2 tablets (40 mg total) by  mouth daily with breakfast for 7 days. 14 tablet 0    Physical Exam: Blood pressure (!) 161/112, pulse (!) 119, temperature 98.3 F (36.8 C), temperature source Oral, resp. rate 20, height 5\' 11"  (1.803 m), weight 99.8 kg, SpO2 97%. Gen: middle aged male, mild distress Resp: increased WOB with nasal cannula in place CV: HR 110-115 on the monitor Abd: soft, non-distended GU: normal external anatomy, fluctuant area on the left side of the scrotum with purulent drainage, no overlying erythema, no crepitus.  There is an indurated area on the left groin w/o fluctuance or drainage. Neuro: moving all extremities  Results for orders placed or performed during the hospital encounter of 05/17/23 (from the past 48 hour(s))  Lipase, blood     Status: Abnormal   Collection Time: 05/17/23  5:06 AM  Result Value Ref Range   Lipase 116 (H) 11 - 51 U/L    Comment: Performed at [facility], [address], [place], [place] [address]  Comprehensive metabolic panel     Status: Abnormal   Collection Time: 05/17/23  5:06 AM  Result Value Ref Range   Sodium 137 135 - 145 mmol/L   Potassium 4.0 3.5 - 5.1 mmol/L   Chloride 106 98 - 111 mmol/L   CO2 11 (L) 22 - 32 mmol/L   Glucose, Bld 100 (H) 70 - 99 mg/dL    Comment: Glucose reference range applies only to samples taken after fasting for at least 8 hours.   BUN 147 (H) 6 - 20 mg/dL    Comment: RESULT CONFIRMED BY MANUAL DILUTION   Creatinine, Ser 21.61 (H) 0.61 - 1.24 mg/dL   Calcium 7.6 (L) 8.9 - 10.3 mg/dL   Total Protein 7.0 6.5 - 8.1 g/dL   Albumin 3.6 3.5 - 5.0 g/dL   AST 21 15 - 41 U/L   ALT 14 0 - 44 U/L   Alkaline Phosphatase 56 38 - 126 U/L   Total Bilirubin 0.4 0.3 - 1.2 mg/dL   GFR, Estimated 2 (L) >60 mL/min    Comment: (NOTE) Calculated using the CKD-EPI Creatinine Equation (2021)    Anion gap 20 (H) 5 - 15    Comment: Performed at [facility], [address], [place], [place] [address]  CBC     Status: Abnormal   Collection Time:  05/17/23  5:06 AM  Result Value Ref Range   WBC 18.0 (H) 4.0 - 10.5 K/uL   RBC 3.41 (L) 4.22 - 5.81 MIL/uL   Hemoglobin 10.4 (L) 13.0 - 17.0 g/dL   HCT 02.6 (L) 37.8 - 58.8 %   MCV 92.4 80.0 - 100.0 fL   MCH 30.5 26.0 - 34.0 pg  MCHC 33.0 30.0 - 36.0 g/dL   RDW 54.0 98.1 - 19.1 %   Platelets 273 150 - 400 K/uL   nRBC 0.1 0.0 - 0.2 %    Comment: Performed at [facility], [address] [place], [place] [address]  Urinalysis, Routine w reflex microscopic -Urine, Clean Catch     Status: Abnormal   Collection Time: 05/17/23  5:06 AM  Result Value Ref Range   Color, Urine STRAW (A) YELLOW   APPearance CLEAR CLEAR   Specific Gravity, Urine 1.010 1.005 - 1.030   pH 5.0 5.0 - 8.0   Glucose, UA 50 (A) NEGATIVE mg/dL   Hgb urine dipstick MODERATE (A) NEGATIVE   Bilirubin Urine NEGATIVE NEGATIVE   Ketones, ur NEGATIVE NEGATIVE mg/dL   Protein, ur 562 (A) NEGATIVE mg/dL   Nitrite NEGATIVE NEGATIVE   Leukocytes,Ua NEGATIVE NEGATIVE   RBC / HPF 0-5 0 - 5 RBC/hpf   WBC, UA 0-5 0 - 5 WBC/hpf   Bacteria, UA RARE (A) NONE SEEN   Squamous Epithelial / HPF 0-5 0 - 5 /HPF   Mucus PRESENT     Comment: Performed at [place] [facility], [address] [place], [place] [address]  Brain natriuretic peptide     Status: Abnormal   Collection Time: 05/17/23  5:06 AM  Result Value Ref Range   B Natriuretic Peptide 925.0 (H) 0.0 - 100.0 pg/mL    Comment: Performed at [facility], [address], [place], [place] [address]  Troponin I (High Sensitivity)     Status: Abnormal   Collection Time: 05/17/23  5:06 AM  Result Value Ref Range   Troponin I (High Sensitivity) 4,584 (HH) <18 ng/L    Comment: CRITICAL RESULT CALLED TO, READ BACK BY AND VERIFIED WITH OLDLAND,B AT 6:25AM ON 05/17/23 BY FESTERMAN,C (NOTE) Elevated high sensitivity troponin I (hsTnI) values and significant  changes across serial measurements may suggest ACS but many other  chronic and acute conditions are known to elevate hsTnI results.   Refer to the "Links" section for chest pain algorithms and additional  guidance. Performed at [facility], [address], [place], [place] [address]   Hepatitis B surface antigen     Status: None   Collection Time: 05/17/23  8:01 AM  Result Value Ref Range   Hepatitis B Surface Ag NON REACTIVE NON REACTIVE    Comment: Performed at [facility], [address], [place], [place] [address]  Protime-INR     Status: Abnormal   Collection Time: 05/17/23  8:02 AM  Result Value Ref Range   Prothrombin Time 16.0 (H) 11.4 - 15.2 seconds   INR 1.3 (H) 0.8 - 1.2    Comment: (NOTE) INR goal varies based on device and disease states. Performed at [facility], [address], [place], [place] [address]   Magnesium     Status: Abnormal   Collection Time: 05/17/23  8:02 AM  Result Value Ref Range   Magnesium 1.6 (L) 1.7 - 2.4 mg/dL    Comment: Performed at [facility], [address], [place], [place] [address]  Phosphorus     Status: Abnormal   Collection Time: 05/17/23  8:02 AM  Result Value Ref Range   Phosphorus 10.5 (H) 2.5 - 4.6 mg/dL    Comment: Performed at [facility], [address] [place], [place] [address]  Troponin I (High Sensitivity)     Status: Abnormal   Collection Time: 05/17/23  8:02 AM  Result Value Ref Range  Troponin I (High Sensitivity) 5,699 (HH) <18 ng/L    Comment: CRITICAL RESULT CALLED TO, READ BACK BY AND VERIFIED WITH TALBOT,T AT 8:30AM ON  05/17/23 BY FESTERMAN,C (NOTE) Elevated high sensitivity troponin I (hsTnI) values and significant  changes across serial measurements may suggest ACS but many other  chronic and acute conditions are known to elevate hsTnI results.  Refer to the "Links" section for chest pain algorithms and additional  guidance. Performed at [facility], [address], [place], [place] [address]   Blood gas, venous (at [facility])     Status: Abnormal   Collection Time: 05/17/23  8:02 AM  Result Value Ref Range   pH, Ven 7.27  7.25 - 7.43   pCO2, Ven 25 (L) 44 - 60 mmHg   pO2, Ven 59 (H) 32 - 45 mmHg   Bicarbonate 11.5 (L) 20.0 - 28.0 mmol/L   Acid-base deficit 13.6 (H) 0.0 - 2.0 mmol/L   O2 Saturation 91.3 %   Patient temperature 36.7    Collection site LEFT ANTECUBITAL    Drawn by [phone]     Comment: Performed at [facility], [address], [place], [place] [address]  D-dimer, quantitative     Status: Abnormal   Collection Time: 05/17/23  8:02 AM  Result Value Ref Range   D-Dimer, Quant 2.14 (H) 0.00 - 0.50 ug/mL-FEU    Comment: (NOTE) At the manufacturer cut-off value of 0.5 g/mL FEU, this assay has a negative predictive value of 95-100%.This assay is intended for use in conjunction with a clinical pretest probability (PTP) assessment model to exclude pulmonary embolism (PE) and deep venous thrombosis (DVT) in outpatients suspected of PE or DVT. Results should be correlated with clinical presentation. Performed at [facility], [address] [place], [place] [address]   Iron and TIBC     Status: Abnormal   Collection Time: 05/17/23  8:02 AM  Result Value Ref Range   Iron 55 45 - 182 ug/dL   TIBC 657 (L) 846 - 962 ug/dL   Saturation Ratios 35 17.9 - 39.5 %   UIBC 101 ug/dL    Comment: Performed at [facility], [address], [place], [place] [address]  Ferritin     Status: None   Collection Time: 05/17/23  8:02 AM  Result Value Ref Range   Ferritin 47 24 - 336 ng/mL    Comment: Performed at [facility], [address], [place], [place] [address]  Troponin I (High Sensitivity)     Status: Abnormal   Collection Time: 05/17/23 10:00 AM  Result Value Ref Range   Troponin I (High Sensitivity) 5,441 (HH) <18 ng/L    Comment: CRITICAL VALUE NOTED. VALUE IS CONSISTENT WITH PREVIOUSLY REPORTED/CALLED VALUE (NOTE) Elevated high sensitivity troponin I (hsTnI) values and significant  changes across serial measurements may suggest ACS but many other  chronic and acute conditions are known to elevate  hsTnI results.  Refer to the "Links" section for chest pain algorithms and additional  guidance. Performed at [facility], [address], [place], [place] [address]   MRSA Next Gen by PCR, Nasal     Status: None   Collection Time: 05/17/23 11:12 AM   Specimen: Urine, Clean Catch; Nasal Swab  Result Value Ref Range   MRSA by PCR Next Gen NOT DETECTED NOT DETECTED    Comment: (NOTE) The GeneXpert MRSA Assay (FDA approved for NASAL specimens only), is one component of a comprehensive MRSA colonization surveillance program. It is not  intended to diagnose MRSA infection nor to guide or monitor treatment for MRSA infections. Test performance is not FDA approved in patients less than 57 years old. Performed at [facility], [address], [place], [place] [address]   Rapid urine drug screen (hospital performed)     Status: None   Collection Time: 05/17/23 11:24 AM  Result Value Ref Range   Opiates NONE DETECTED NONE DETECTED   Cocaine NONE DETECTED NONE DETECTED   Benzodiazepines NONE DETECTED NONE DETECTED   Amphetamines NONE DETECTED NONE DETECTED   Tetrahydrocannabinol NONE DETECTED NONE DETECTED   Barbiturates NONE DETECTED NONE DETECTED    Comment: (NOTE) DRUG SCREEN FOR MEDICAL PURPOSES ONLY.  IF CONFIRMATION IS NEEDED FOR ANY PURPOSE, NOTIFY LAB WITHIN 5 DAYS.  LOWEST DETECTABLE LIMITS FOR URINE DRUG SCREEN Drug Class                     Cutoff (ng/mL) Amphetamine and metabolites    1000 Barbiturate and metabolites    200 Benzodiazepine                 200 Opiates and metabolites        300 Cocaine and metabolites        300 THC                            50 Performed at [facility], [address] [place], [place] [address]   HIV Antibody (routine testing w rflx)     Status: None   Collection Time: 05/17/23 12:01 PM  Result Value Ref Range   HIV Screen 4th Generation wRfx Non Reactive Non Reactive    Comment: Performed at [facility],  [address] [place], [place] [address]  Comprehensive metabolic panel     Status: Abnormal   Collection Time: 05/17/23 12:01 PM  Result Value Ref Range   Sodium 138 135 - 145 mmol/L   Potassium 4.3 3.5 - 5.1 mmol/L   Chloride 107 98 - 111 mmol/L   CO2 12 (L) 22 - 32 mmol/L   Glucose, Bld 86 70 - 99 mg/dL    Comment: Glucose reference range applies only to samples taken after fasting for at least 8 hours.   BUN 150 (H) 6 - 20 mg/dL   Creatinine, Ser 46.96 (H) 0.61 - 1.24 mg/dL   Calcium 7.6 (L) 8.9 - 10.3 mg/dL   Total Protein 7.0 6.5 - 8.1 g/dL   Albumin 3.3 (L) 3.5 - 5.0 g/dL   AST 23 15 - 41 U/L   ALT 15 0 - 44 U/L   Alkaline Phosphatase 57 38 - 126 U/L   Total Bilirubin 0.4 0.3 - 1.2 mg/dL   GFR, Estimated 2 (L) >60 mL/min    Comment: (NOTE) Calculated using the CKD-EPI Creatinine Equation (2021)    Anion gap 19 (H) 5 - 15    Comment: Performed at [facility], [address], [place], [place] [address]  Magnesium     Status: None   Collection Time: 05/17/23 12:01 PM  Result Value Ref Range   Magnesium 2.0 1.7 - 2.4 mg/dL    Comment: Performed at [facility], [address], [place], [place] [address]  Phosphorus     Status: Abnormal   Collection Time: 05/17/23 12:01 PM  Result Value Ref Range   Phosphorus 10.2 (H) 2.5 - 4.6 mg/dL  Comment: Performed at [facility], [address], [place], [place] [address]  Lactic acid, plasma     Status: None   Collection Time: 05/17/23 12:01 PM  Result Value Ref Range   Lactic Acid, Venous 1.2 0.5 - 1.9 mmol/L    Comment: Performed at [facility], [address], [place], [place] [address]  CBC with Differential/Platelet     Status: Abnormal   Collection Time: 05/17/23 12:01 PM  Result Value Ref Range   WBC 16.1 (H) 4.0 - 10.5 K/uL   RBC 3.47 (L) 4.22 - 5.81 MIL/uL   Hemoglobin 10.7 (L) 13.0 - 17.0 g/dL   HCT 91.4 (L) 78.2 - 95.6 %   MCV 94.2 80.0 - 100.0 fL   MCH 30.8 26.0 - 34.0 pg   MCHC 32.7 30.0  - 36.0 g/dL   RDW 21.3 08.6 - 57.8 %   Platelets 294 150 - 400 K/uL   nRBC 0.1 0.0 - 0.2 %   Neutrophils Relative % 80 %   Neutro Abs 12.8 (H) 1.7 - 7.7 K/uL   Lymphocytes Relative 14 %   Lymphs Abs 2.3 0.7 - 4.0 K/uL   Monocytes Relative 5 %   Monocytes Absolute 0.8 0.1 - 1.0 K/uL   Eosinophils Relative 0 %   Eosinophils Absolute 0.0 0.0 - 0.5 K/uL   Basophils Relative 0 %   Basophils Absolute 0.0 0.0 - 0.1 K/uL   Immature Granulocytes 1 %   Abs Immature Granulocytes 0.16 (H) 0.00 - 0.07 K/uL    Comment: Performed at [facility], [address], [place], [place] [address]  Type and screen If need to transfuse blood products please use the blood administration order set     Status: None   Collection Time: 05/17/23 12:01 PM  Result Value Ref Range   ABO/RH(D) AB POS    Antibody Screen NEG    Sample Expiration      05/20/2023,2359 Performed at [facility], [address], [place], [place] [address]   Hemoglobin A1c     Status: Abnormal   Collection Time: 05/17/23 12:01 PM  Result Value Ref Range   Hgb A1c MFr Bld 5.8 (H) 4.8 - 5.6 %    Comment: (NOTE) Pre diabetes:          5.7%-6.4%  Diabetes:              >6.4%  Glycemic control for   <7.0% adults with diabetes    Mean Plasma Glucose 119.76 mg/dL    Comment: Performed at [facility], [address], [place], [place] [address]  ABO/Rh     Status: None   Collection Time: 05/17/23 12:03 PM  Result Value Ref Range   ABO/RH(D)      AB POS Performed at [facility], [address], [place], [place] [address]   Glucose, capillary     Status: Abnormal   Collection Time: 05/17/23 12:29 PM  Result Value Ref Range   Glucose-Capillary 65 (L) 70 - 99 mg/dL    Comment: Glucose reference range applies only to samples taken after fasting for at least 8 hours.  Glucose, capillary     Status: Abnormal   Collection Time: 05/17/23  2:16 PM  Result Value Ref Range   Glucose-Capillary 65 (L) 70 - 99 mg/dL     Comment: Glucose reference range applies only to samples taken after fasting for at least 8 hours.  Glucose, capillary  Status: Abnormal   Collection Time: 05/17/23  2:40 PM  Result Value Ref Range   Glucose-Capillary 110 (H) 70 - 99 mg/dL    Comment: Glucose reference range applies only to samples taken after fasting for at least 8 hours.  Lactic acid, plasma     Status: None   Collection Time: 05/17/23  2:58 PM  Result Value Ref Range   Lactic Acid, Venous 1.5 0.5 - 1.9 mmol/L    Comment: Performed at [facility], [address], [place], [place] [address]  Heparin level (unfractionated)     Status: Abnormal   Collection Time: 05/17/23  2:58 PM  Result Value Ref Range   Heparin Unfractionated 0.12 (L) 0.30 - 0.70 IU/mL    Comment: (NOTE) The clinical reportable range upper limit is being lowered to >1.10 to align with the FDA approved guidance for the current laboratory assay.  If heparin results are below expected values, and patient dosage has  been confirmed, suggest follow up testing of antithrombin III levels. Performed at [facility], [address] [place], [place] [address]   Glucose, capillary     Status: None   Collection Time: 05/17/23  4:19 PM  Result Value Ref Range   Glucose-Capillary 78 70 - 99 mg/dL    Comment: Glucose reference range applies only to samples taken after fasting for at least 8 hours.  Glucose, capillary     Status: None   Collection Time: 05/17/23  7:42 PM  Result Value Ref Range   Glucose-Capillary 89 70 - 99 mg/dL    Comment: Glucose reference range applies only to samples taken after fasting for at least 8 hours.  Heparin level (unfractionated)     Status: Abnormal   Collection Time: 05/17/23  8:59 PM  Result Value Ref Range   Heparin Unfractionated 0.11 (L) 0.30 - 0.70 IU/mL    Comment: (NOTE) The clinical reportable range upper limit is being lowered to >1.10 to align with the FDA approved guidance for the current  laboratory assay.  If heparin results are below expected values, and patient dosage has  been confirmed, suggest follow up testing of antithrombin III levels. Performed at [facility], [address], [place], [place] [address]    ECHOCARDIOGRAM COMPLETE  Result Date: 05/17/2023    ECHOCARDIOGRAM REPORT   Patient Name:   Zachary Lynch [facility] Date of Exam: 05/17/2023 Medical Rec #:  [id number]    Height:       71.0 in Accession #:    [id number]   Weight:       220.0 lb Date of Birth:  01/08/1986     BSA:          2.196 m Patient Age:    37 years     BP:           122/104 mmHg Patient Gender: M            HR:           120 bpm. Exam Location:  Inpatient Procedure: 2D Echo, Cardiac Doppler, Color Doppler and Intracardiac            Opacification Agent Indications:    CHF  History:        Patient has prior history of Echocardiogram examinations, most                 recent 06/06/2018. Cardiomyopathy, Stroke; Risk Factors:Obesity,  Dyslipidemia and Hypertension. CKD.  Sonographer:    [name] Referring Phys: [id number] JASON MESNER IMPRESSIONS  1. Left ventricular ejection fraction, by estimation, is 25%. The left ventricle has severely decreased function. The left ventricle demonstrates regional wall motion abnormalities (see scoring diagram/findings for description). The left ventricular internal cavity size was mildly dilated. There is moderate concentric left ventricular hypertrophy. Left ventricular diastolic parameters are indeterminate. Largely hypokinetic save for the apex.  2. Right ventricular systolic function is hyperdynamic. The right ventricular size is normal.  3. Left atrial size was severely dilated.  4. The mitral valve is grossly normal. Mild mitral valve regurgitation. No evidence of mitral stenosis.  5. The aortic valve was not well visualized. There is mild thickening of the aortic valve. Aortic valve regurgitation is not visualized.  6. The inferior vena cava is normal in  size with greater than 50% respiratory variability, suggesting right atrial pressure of 3 mmHg. Comparison(s): Prior images reviewed side by side. Compared to 2019 report, LVEF is similar. Elevated heart rates and narrow pulse pressure were not reported at prior reports. Conclusion(s)/Recommendation(s): Will reach out to primary team. FINDINGS  Left Ventricle: Left ventricular ejection fraction, by estimation, is 25%. The left ventricle has severely decreased function. The left ventricle demonstrates regional wall motion abnormalities. Definity contrast agent was given IV to delineate the left  ventricular endocardial borders. The left ventricular internal cavity size was mildly dilated. There is moderate concentric left ventricular hypertrophy. Left ventricular diastolic parameters are indeterminate.  LV Wall Scoring: The anterior wall, antero-lateral wall, anterior septum, inferior wall, posterior wall, mid inferoseptal segment, and basal inferoseptal segment are hypokinetic. Largely hypokinetic save for the apex. Right Ventricle: The right ventricular size is normal. No increase in right ventricular wall thickness. Right ventricular systolic function is hyperdynamic. Left Atrium: Left atrial size was severely dilated. Right Atrium: Right atrial size was normal in size. Pericardium: Trivial pericardial effusion is present. The pericardial effusion is circumferential. Mitral Valve: The mitral valve is grossly normal. Mild mitral valve regurgitation. No evidence of mitral valve stenosis. Tricuspid Valve: The tricuspid valve is normal in structure. Tricuspid valve regurgitation is not demonstrated. No evidence of tricuspid stenosis. Aortic Valve: The aortic valve was not well visualized. There is mild thickening of the aortic valve. Aortic valve regurgitation is not visualized. Pulmonic Valve: The pulmonic valve was normal in structure. Pulmonic valve regurgitation is trivial. No evidence of pulmonic stenosis. Aorta:  The aortic root, ascending aorta and aortic arch are all structurally normal, with no evidence of dilitation or obstruction. Venous: The inferior vena cava is normal in size with greater than 50% respiratory variability, suggesting right atrial pressure of 3 mmHg. IAS/Shunts: The interatrial septum was not well visualized.  LEFT VENTRICLE PLAX 2D LVIDd:         6.10 cm   Diastology LVIDs:         5.25 cm   LV e' medial:  10.09 cm/s LV PW:         1.40 cm   LV e' lateral: 8.16 cm/s LV IVS:        1.30 cm LVOT diam:     2.20 cm LVOT Area:     3.80 cm  RIGHT VENTRICLE             IVC RV S prime:     12.30 cm/s  IVC diam: 1.70 cm LEFT ATRIUM              Index  RIGHT ATRIUM           Index LA diam:        4.80 cm  2.19 cm/m   RA Area:     14.60 cm LA Vol (A2C):   124.0 ml 56.47 ml/m  RA Volume:   34.50 ml  15.71 ml/m LA Vol (A4C):   113.0 ml 51.46 ml/m LA Biplane Vol: 123.0 ml 56.02 ml/m   AORTA Ao Root diam: 3.40 cm Ao Asc diam:  3.40 cm MR Peak grad: 55.1 mmHg MR Vmax:      371.00 cm/s SHUNTS                           Systemic Diam: 2.20 cm Riley Lam MD Electronically signed by Riley Lam MD Signature Date/Time: 05/17/2023/4:22:43 PM    Final    CT PELVIS W CONTRAST  Result Date: 05/17/2023 CLINICAL DATA:  Scrotal swelling or edema. Scrotal abscess. Evaluate for Fournier's gangrene. EXAM: CT PELVIS WITH CONTRAST TECHNIQUE: Multidetector CT imaging of the pelvis was performed using the standard protocol following the bolus administration of intravenous contrast. RADIATION DOSE REDUCTION: This exam was performed according to the departmental dose-optimization program which includes automated exposure control, adjustment of the mA and/or kV according to patient size and/or use of iterative reconstruction technique. CONTRAST:  75mL OMNIPAQUE IOHEXOL 350 MG/ML SOLN COMPARISON:  Abdominopelvic CT 07/19/2016. FINDINGS: Urinary Tract: The visualized distal ureters and bladder appear  unremarkable. Bowel: No bowel wall thickening, distention or surrounding inflammation identified within the pelvis. No enteric contrast administered. Vascular/Lymphatic: No enlarged pelvic lymph nodes identified. There are small external iliac and inguinal lymph nodes bilaterally, similar to previous study, likely reactive. Minimal iliac atherosclerosis without evidence of aneurysm. Reproductive: The prostate gland and seminal vesicles appear unremarkable. The visualized penis appears unremarkable. The scrotum is incompletely visualized by this examination. Other: No focal pelvic inflammatory changes or soft tissue emphysema identified. As above, the scrotum and perineum are incompletely visualized. Musculoskeletal: No acute osseous findings. No evidence of femoral head osteonecrosis or acute fracture. Suspected chronic sacroiliitis bilaterally. IMPRESSION: 1. No focal pelvic inflammatory changes or soft tissue emphysema identified. 2. The scrotum and perineum are incompletely visualized by this examination. Consider scrotal ultrasound for further evaluation. Consider urology consult. 3. Suspected chronic sacroiliitis bilaterally. Electronically Signed   By: Carey Bullocks M.D.   On: 05/17/2023 15:51   DG Chest 2 View  Result Date: 05/17/2023 CLINICAL DATA:  Shortness of breath and congestion. Cough and emesis. Recent diagnosis of pancreatitis. EXAM: CHEST - 2 VIEW COMPARISON:  10/18/20 FINDINGS: The heart size and mediastinal contours are within normal limits. Both lungs are clear. The visualized skeletal structures are unremarkable. IMPRESSION: No active cardiopulmonary disease. Electronically Signed   By: Signa Kell M.D.   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 He is not a candidate for the OR given his current clinical picture - The scrotal abscess is draining, but you can consider  a warm compress to promote ongoing drainage - Will defer any imaging to the primary team.  If there is concern for additional abscesses on the scrotum then I would defer to urology for management.   - Please reach out to the on call general surgery team w/ questions or concerns  I reviewed last 24 h vitals and pain scores, last 24 h labs and trends, and last 24 h imaging results.  Tacy Learn Surgery 05/17/2023, 9:33 PM Please see Amion for pager number during day hours 7:00am-4:30pm or 7:00am -11:30am on weekends  Management of this patient required more than 60 minutes of time, including the review of labs and imaging, and focused assessment, and a discussion of the proposed plan and possible course.

## 2023-05-17 NOTE — Plan of Care (Signed)

## 2023-05-17 NOTE — ED Notes (Signed)
Pt given 2 warm blankets

## 2023-05-17 NOTE — ED Notes (Signed)
Pt coughing then started vomiting during I&D procedure. 1mg  IV given per verbal order from Dr.Mesner. New sheets and gown placed.

## 2023-05-17 NOTE — Progress Notes (Addendum)
ANTICOAGULATION CONSULT NOTE   Pharmacy Consult for Heparin Indication:  ACS / STEMI  Allergies  Allergen Reactions   Albuterol Other (See Comments)    Reaction is unknown. Allergy was entered post Stroke "caused him to have a stroke"     Tramadol Nausea Only    Made patient disoriented    Patient Measurements: Height: 5\' 11"  (180.3 cm) Weight: 99.8 kg (220 lb) IBW/kg (Calculated) : 75.3 Heparin Dosing Weight: 95.8 kg  Vital Signs: Temp: 97.8 F (36.6 C) (08/27 1116) Temp Source: Axillary (08/27 1116) BP: 122/104 (08/27 1330) Pulse Rate: 125 (08/27 1330)  Labs: Recent Labs    05/17/23 0506 05/17/23 0802 05/17/23 1000 05/17/23 1201 05/17/23 1458  HGB 10.4*  --   --  10.7*  --   HCT 31.5*  --   --  32.7*  --   PLT 273  --   --  294  --   LABPROT  --  16.0*  --   --   --   INR  --  1.3*  --   --   --   HEPARINUNFRC  --   --   --   --  0.12*  CREATININE 21.61*  --   --  21.88*  --   TROPONINIHS 4,584* 5,699* 5,441*  --   --     Estimated Creatinine Clearance: 5.5 mL/min (A) (by C-G formula based on SCr of 21.88 mg/dL (H)).   Medical History: Past Medical History:  Diagnosis Date   Arthritis    CKD stage 4 secondary to hypertension (HCC) 03/28/2017   Elevated blood uric acid level    Essential hypertension    Folliculitis    Managed with doxycycline, topical clindamycin/benzoyl peroxide   GERD (gastroesophageal reflux disease)    Gout    HFrEF (heart failure with reduced ejection fraction) (HCC)    Echo 2019 with EF 30-35%, hypokinesis   History of cardiomyopathy    LVEF 15% in 2010 - evaluated by [facility] and Dr. Graciela Husbands   History of stroke 2010   Right MCA distribution infarct, felt to be possibly embolic in the setting of cardiomyopathy - placed on Coumadin at that time   Hyperlipidemia    Nonischemic cardiomyopathy (HCC) 03/28/2017   Obesity    Prediabetes 05/03/2017    Assessment: Zachary Lynch is a 37 y.o. year old male admitted on 05/17/2023 with  concern for NSTEMI. No anticoagulation prior to admission. Pharmacy consulted to dose heparin for ACS/STEMI.  Anti-xa 0.12, subtherapeutic  Of note heparin gtt paused for procedure for almost 2 hours and restarted after 1400. Heparin running appropriately and no bleeding concerns at this time.   Goal of Therapy:  Heparin level 0.3-0.7 units/ml Monitor platelets by anticoagulation protocol: Yes   Plan:  Continue heparin infusion at 1300 units/hr 6 heparin level  Daily heparin level, CBC, and monitoring for bleeding F/u plans for [facility] 8/28 AM   Thank you for allowing pharmacy to participate in this patient's care.  Marja Kays, PharmD Emergency Medicine Clinical Pharmacist 05/17/2023,4:20 PM   ----------------------------------  ADDENDUM:  Anti-xa 0.11, subtherapeutic  Heparin running appropriately, no issue per nurse.  Plan: Increase heparin infusion to 1450 units/hr 6 hour heparin level  Thank you for allowing pharmacy to participate in this patient's care.  Marja Kays, PharmD Emergency Medicine Clinical Pharmacist 05/17/2023,9:38 PM

## 2023-05-17 NOTE — Procedures (Signed)
Vascular and Interventional Radiology Procedure Note  Patient: Zachary Lynch DOB: 1986/05/07 Medical Record Number: [id number] Note Date/Time: 05/17/23 2:16 PM   Performing Physician: [name], MD Assistant(s): None  Diagnosis: ESRD requiring Hemodialysis  Procedure: TUNNELED HEMODIALYSIS CATHETER PLACEMENT  Anesthesia: Conscious Sedation Complications: None Estimated Blood Loss: Minimal Specimens:  None  Findings:  Successful placement of right-sided, 23 cm (tip-to-cuff), tunneled hemodialysis catheter with the tip of the catheter in the proximal right atrium.  Plan: Catheter ready for use.  See detailed procedure note with images in PACS. The patient tolerated the procedure well without incident or complication and was returned to Recovery in stable condition.    [name], MD [facility] Inter[facility] Radiology   Pager. [phone] Clinic. [phone]

## 2023-05-17 NOTE — Progress Notes (Addendum)
ANTICOAGULATION CONSULT NOTE - Initial Consult  Pharmacy Consult for Heparin Indication:  ACS / STEMI  Allergies  Allergen Reactions   Albuterol Other (See Comments)    Reaction is unknown. Allergy was entered post Stroke "caused him to have a stroke"     Tramadol Nausea Only    Made patient disoriented    Patient Measurements: Height: 5\' 11"  (180.3 cm) Weight: 99.8 kg (220 lb) IBW/kg (Calculated) : 75.3 Heparin Dosing Weight: 95.8 kg  Vital Signs: Temp: 98.1 F (36.7 C) (08/27 0445) Temp Source: Oral (08/27 0445) BP: 168/138 (08/27 0630) Pulse Rate: 108 (08/27 0630)  Labs: Recent Labs    05/17/23 0506 05/17/23 0802  HGB 10.4*  --   HCT 31.5*  --   PLT 273  --   LABPROT  --  16.0*  INR  --  1.3*  CREATININE 21.61*  --   TROPONINIHS 4,584*  --     Estimated Creatinine Clearance: 5.5 mL/min (A) (by C-G formula based on SCr of 21.61 mg/dL (H)).   Medical History: Past Medical History:  Diagnosis Date   Arthritis    CKD stage 4 secondary to hypertension (HCC) 03/28/2017   Elevated blood uric acid level    Essential hypertension    GERD (gastroesophageal reflux disease)    Gout    History of cardiomyopathy    LVEF 15% in 2010 - evaluated by [facility] and Dr. Graciela Husbands   History of stroke 2010   Right MCA distribution infarct, felt to be possibly embolic in the setting of cardiomyopathy - placed on Coumadin at that time   Hyperlipidemia    Nonischemic cardiomyopathy (HCC) 03/28/2017   Obesity    Prediabetes 05/03/2017    Medications:  See med rec  Assessment: Patient presented to ER with acute dyspnea, nonproductive cough, and posttusive cough. Have been off BP medications due to lack of insurance and money. Not on any anticoagulants PTA. Pharmacy consulted to start heparin for suspect ACS/STEMI.  Goal of Therapy:  Heparin level 0.3-0.7 units/ml Monitor platelets by anticoagulation protocol: Yes   Plan:  Give 4000 units bolus x 1 Start heparin infusion at 1300  units/hr Check anti-Xa level in 6-8 hours and daily while on heparin Continue to monitor H&H and platelets  Tory Emerald, PharmD Candidate 05/17/2023,8:27 AM

## 2023-05-17 NOTE — Consult Note (Signed)
Nephrology Consult   Assessment/Recommendations:  AKI on CKD5 -likely progressive CKD, ESRD at this point. Followed by Dr. Allena Katz OP however lost to follow up since 2022 -patient is moving to [facility], to be admitted under CCM service. At this junction, will need to start dialysis especially given what sounds like chronic uremic symptoms. Discussed this at length with patient and is agreeable -once at [facility], will need HD access once he gets there therefore, will hold off on placing any consults for access as of now. Plan for HD#1 once catheter is in place (today vs tomorrow)--slow start protocol. Will also need permanent access creation (VVS consult) and CLIP for outpatient placement prior to discharge -Avoid nephrotoxic medications including NSAIDs and iodinated intravenous contrast exposure unless the latter is absolutely indicated.  Preferred narcotic agents for pain control are hydromorphone, fentanyl, and methadone. Morphine should not be used. Avoid Baclofen and avoid oral sodium phosphate and magnesium citrate based laxatives / bowel preps. Continue strict Input and Output monitoring. Will monitor the patient closely with you and intervene or adjust therapy as indicated by changes in clinical status/labs   Elevated troponins -cardiology to consult  Scrotal Abscess -urology to consult  HTN -HD start as above. Will UF as tolerated  Anemia of CKD -normocytic -transfuse PRN for Hgb <7 -will check Fe panel  Secondary hyperparathyroidism -will check PO4 and PTH  Metabolic acidosis -HD start as above   Recommendations conveyed to primary service.    Zachary Lynch [facility] 05/17/2023 8:30 AM   _____________________________________________________________________________________   History of Present Illness: Zachary Lynch is a/an 37 y.o. male with a past medical history of CKD 5, chronic systolic CHF, hypertension, GERD, gout, hyperlipidemia who presents to [facility] with  vomiting. Vomiting sometimes assc'd with coughing, has also been having SOB. Has had unintentional weight loss as well. Also found to have a scrotal abscess. Has been nonadherent to medications due to lack of insurance and money. In the ER, found to have a Cr of 21.6 (last Cr from 01/14/23 revealed a Cr of 16.5), BUN 147, K 4, bicarb 11, BNP 4,584, Hgb 10.4, CXR without active cardiopulmonary disease. Scrotal abscess unable to be drained in ER given patient's inability to lay flat. Cardiology and urology consults pending. Followed by Dr. Allena Katz however lost to follow up since 2022. Was supposed to have AVG placed when closer to needing HD however, again, lost to follow up. Patient seen and examined bedside in ER this AM. Patient to be transferred to [facility] for possible heart catheterization. He reports that his nausea/vomiting, lack of appetite has been an ongoing issue for months now. Denies any dysgeusia. He reports that he is peeing a lot. Otherwise denies any chest pain currently.   Medications:  Current Facility-Administered Medications  Medication Dose Route Frequency Provider Last Rate Last Admin   aspirin chewable tablet 324 mg  324 mg Oral STAT Rondel Baton, MD       Current Outpatient Medications  Medication Sig Dispense Refill   allopurinol (ZYLOPRIM) 100 MG tablet Take 2 tablets (200 mg total) by mouth daily. 180 tablet 0   amLODipine (NORVASC) 10 MG tablet Take 1 tablet (10 mg total) by mouth daily. 90 tablet 1   aspirin (ASPIRIN LOW DOSE) 81 MG EC tablet Take 1 tablet (81 mg total) by mouth daily. 90 tablet 1   atorvastatin (LIPITOR) 20 MG tablet Take 1 tablet (20 mg total) by mouth daily. 90 tablet 1   carvedilol (COREG) 25 MG  tablet Take 2 tablets (50 mg total) by mouth 2 (two) times daily with a meal. 360 tablet 1   Clindamycin-Benzoyl Per, Refr, gel Apply 1 Application topically 2 (two) times daily. 45 g 11   doxazosin (CARDURA) 2 MG tablet Take 1 tablet (2 mg total) by mouth at  bedtime. 90 tablet 1   famotidine (PEPCID) 40 MG tablet Take 1 tablet (40 mg total) by mouth daily. 90 tablet 1   ofloxacin (OCUFLOX) 0.3 % ophthalmic solution Place 1 drop four times daily in the affected eye for 7 days. If symptoms develop, okay to use in both eyes. 5 mL 0   pantoprazole (PROTONIX) 40 MG tablet Take 1 tablet (40 mg total) by mouth daily. 90 tablet 1   predniSONE (DELTASONE) 20 MG tablet Take 2 tablets (40 mg total) by mouth daily with breakfast. 10 tablet 0   predniSONE (DELTASONE) 20 MG tablet Take 2 tablets (40 mg total) by mouth daily with breakfast for 7 days. 14 tablet 0     ALLERGIES Albuterol and Tramadol  MEDICAL HISTORY Past Medical History:  Diagnosis Date   Arthritis    CKD stage 4 secondary to hypertension (HCC) 03/28/2017   Elevated blood uric acid level    Essential hypertension    GERD (gastroesophageal reflux disease)    Gout    History of cardiomyopathy    LVEF 15% in 2010 - evaluated by [facility] and Dr. Graciela Husbands   History of stroke 2010   Right MCA distribution infarct, felt to be possibly embolic in the setting of cardiomyopathy - placed on Coumadin at that time   Hyperlipidemia    Nonischemic cardiomyopathy (HCC) 03/28/2017   Obesity    Prediabetes 05/03/2017     SOCIAL HISTORY Social History   Socioeconomic History   Marital status: Single    Spouse name: Not on file   Number of children: Not on file   Years of education: Not on file   Highest education level: Associate degree: occupational, Scientist, product/process development, or vocational program  Occupational History   Not on file  Tobacco Use   Smoking status: Never   Smokeless tobacco: Never  Vaping Use   Vaping status: Never Used  Substance and Sexual Activity   Alcohol use: No    Alcohol/week: 0.0 standard drinks of alcohol   Drug use: No   Sexual activity: Not Currently  Other Topics Concern   Not on file  Social History Narrative   Not on file   Social Determinants of Health   Financial Resource  Strain: Low Risk  (01/13/2023)   Overall Financial Resource Strain (CARDIA)    Difficulty of Paying Living Expenses: Not hard at all  Food Insecurity: No Food Insecurity (01/13/2023)   Hunger Vital Sign    Worried About Running Out of Food in the Last Year: Never true    Ran Out of Food in the Last Year: Never true  Transportation Needs: No Transportation Needs (01/13/2023)   PRAPARE - Administrator, Civil Service (Medical): No    Lack of Transportation (Non-Medical): No  Physical Activity: Unknown (01/13/2023)   Exercise Vital Sign    Days of Exercise per Week: 0 days    Minutes of Exercise per Session: Not on file  Stress: Stress Concern Present (01/13/2023)   [employer] of Occupational Health - Occupational Stress Questionnaire    Feeling of Stress : To some extent  Social Connections: Moderately Integrated (01/13/2023)   Social Connection and Isolation Panel [NHANES]  Frequency of Communication with Friends and Family: More than three times a week    Frequency of Social Gatherings with Friends and Family: Twice a week    Attends Religious Services: More than 4 times per year    Active Member of [employer] or Organizations: Yes    Attends Engineer, structural: More than 4 times per year    Marital Status: Never married  Catering manager Violence: Not on file     FAMILY HISTORY Family History  Problem Relation Age of Onset   Stroke Cousin    Hypertension Mother    Heart attack Mother        Died age 80   Hypertension Father     Review of Systems: 12 systems reviewed Otherwise as per HPI, all other systems reviewed and negative  Physical Exam: Vitals:   05/17/23 0615 05/17/23 0630  BP: (!) 181/134 (!) 168/138  Pulse: (!) 117 (!) 108  Resp: (!) 25 (!) 29  Temp:    SpO2:  94%   No intake/output data recorded. No intake or output data in the 24 hours ending 05/17/23 0830 General: ill-appearing, no acute distress HEENT: anicteric sclera,  oropharynx clear without lesions CV: tachycardic, normal rhythm, no murmurs, no gallops, no rubs Lungs: clear to auscultation bilaterally, normal work of breathing Abd: soft, non-tender, non-distended Skin: no visible lesions or rashes Psych: alert, engaged, appropriate mood and affect Musculoskeletal: trace ankle edema bilaterally Neuro: normal speech, no gross focal deficits, no myoclonic jerking observed  Test Results Reviewed Lab Results  Component Value Date   NA 137 05/17/2023   K 4.0 05/17/2023   CL 106 05/17/2023   CO2 11 (L) 05/17/2023   BUN 147 (H) 05/17/2023   CREATININE 21.61 (H) 05/17/2023   GLU 101 01/30/2018   CALCIUM 7.6 (L) 05/17/2023   ALBUMIN 3.6 05/17/2023   PHOS 10.5 (H) 05/17/2023     I have reviewed all relevant outside healthcare records related to the patient's kidney injury.

## 2023-05-17 NOTE — ED Notes (Signed)
[facility] here for transport.  

## 2023-05-17 NOTE — TOC Initial Note (Addendum)
Transition of Care [facility]) - Initial/Assessment Note    Patient Details  Name: Zachary Lynch MRN: [id number] Date of Birth: 1986-09-17  [facility] CM/SW Contact:    Elliot Cousin, RN Phone Number: [phone] 05/17/2023, 3:34 PM  Clinical Narrative:  Spoke to pt and states he is working full-time and his new insurance will begin Jun 21, 2023. States the insurance he purchased through the market place is not active. Sent referral to [employer] for Medicaid screening. Pt reports he is able to pay his household bills. He drives to his appts. He was independent pta.                Pt states he will continue with his PCP until he finds a provider in [place]. States he has provider to complete his FMLA/disability paperwork.   Expected Discharge Plan: Home/Self Care Barriers to Discharge: Continued Medical Work up   Patient Goals and CMS Choice Patient states their goals for this hospitalization and ongoing recovery are:: wants to get back to work          Expected Discharge Plan and Services   Discharge Planning Services: CM Consult   Living arrangements for the past 2 months: Single Family Home                                      Prior Living Arrangements/Services Living arrangements for the past 2 months: Single Family Home Lives with:: Relatives Patient language and need for interpreter reviewed:: Yes Do you feel safe going back to the place where you live?: Yes      Need for Family Participation in Patient Care: No (Comment) Care giver support system in place?: No (comment)   Criminal Activity/Legal Involvement Pertinent to Current Situation/Hospitalization: No - Comment as needed  Activities of Daily Living      Permission Sought/Granted Permission sought to share information with : Case Manager, Family Supports, PCP Permission granted to share information with : Yes, Verbal Permission Granted  Share Information with NAME: Sean Lupton     Permission granted to share info w Relationship: sister  Permission granted to share info w Contact Information: [phone]  Emotional Assessment Appearance:: Appears stated age Attitude/Demeanor/Rapport: Engaged Affect (typically observed): Accepting Orientation: : Oriented to Self, Oriented to Place, Oriented to  Time, Oriented to Situation   Psych Involvement: No (comment)  Admission diagnosis:  Acidosis [E87.20] Elevated troponin [R79.89] Scrotal abscess [N49.2] NSTEMI (non-ST elevated myocardial infarction) (HCC) [I21.4] Elevated brain natriuretic peptide (BNP) level [R79.89] Acute renal failure superimposed on stage 5 chronic kidney disease, not on chronic dialysis, unspecified acute renal failure type (HCC) [N17.9, N18.5] Hypertension, unspecified type [I10] Patient Active Problem List   Diagnosis Date Noted   NSTEMI (non-ST elevated myocardial infarction) (HCC) 05/17/2023   Benign hypertensive heart and kidney disease with HF and CKD stage V (HCC) 05/17/2023   Dyspnea on exertion 05/17/2023   Prolonged Q-T interval on ECG 05/17/2023   Hypomagnesemia 05/17/2023   Leukocytosis 05/17/2023   Scrotal infection 05/17/2023   History of ischemic right MCA stroke 05/17/2023   Hypertensive crisis 05/17/2023   Acute on chronic HFrEF (heart failure with reduced ejection fraction) (HCC) 05/17/2023   Non-ST elevation (NSTEMI) myocardial infarction (HCC) 05/17/2023   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) 06/03/2022   Acute gout due to renal impairment involving left elbow 06/03/2022   Acute  gout due to renal impairment involving right foot 05/07/2020   Hypertensive kidney disease with CKD stage IV (HCC) 01/23/2019   Noncompliance with treatment plan 10/28/2017   Encounter for monitoring statin therapy 10/28/2017   Chronic systolic heart failure (HCC) 10/28/2017   Acute renal failure superimposed on stage 4 chronic kidney disease (HCC) 05/04/2017   Uncontrolled  stage 2 hypertension 05/04/2017   Prediabetes 05/03/2017   Elevated blood uric acid level 05/03/2017   Dyslipidemia, goal LDL below 100 05/03/2017   History of stroke 03/28/2017   CKD (chronic kidney disease) stage 4, GFR 15-29 ml/min (HCC) 03/28/2017   Gastroesophageal reflux disease 03/28/2017   Nonischemic cardiomyopathy (HCC) 03/28/2017   Class 3 severe obesity due to excess calories with serious comorbidity and body mass index (BMI) of 40.0 to 44.9 in adult (HCC) 03/28/2017   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 [place] [address] Phone: [phone] Fax: [phone]     Social Determinants of Health (SDOH) Social History: SDOH Screenings   Food Insecurity: No Food Insecurity (01/13/2023)  Housing: Low Risk  (01/13/2023)  Transportation Needs: No Transportation Needs (01/13/2023)  Depression (PHQ2-9): Low Risk  (01/14/2023)  Financial Resource Strain: Low Risk  (01/13/2023)  Physical Activity: Unknown (01/13/2023)  Social Connections: Moderately Integrated (01/13/2023)  Stress: Stress Concern Present (01/13/2023)  Tobacco Use: Low Risk  (05/17/2023)   SDOH Interventions:     Readmission Risk Interventions     No data to display

## 2023-05-17 NOTE — ED Provider Notes (Signed)
  Physical Exam  BP (!) 161/108   Pulse (!) 119   Temp 98.1 F (36.7 C) (Oral)   Resp (!) 21   Ht 5\' 11"  (1.803 m)   Wt 99.8 kg   SpO2 96%   BMI 30.68 kg/m   Physical Exam  Procedures  Procedures  ED Course / MDM   Clinical Course as of 05/17/23 2013  Tue May 17, 2023  0730 Assumed care from Dr Clayborne Dana. Has hx of CHF and ESRD not on iHD but still makes some urine. Hasn't followed up in several years with nephro. Yesterday had vomiting and shortness of breath. Has an abscess on his scrotum. Spo2 is normal but is tachypneic with clear lung sounds. Has draining abscess on his scrotum but no concern for fourniers. Has prolonged QT. Troponin is 4,500. Going to talk to cardiology. Bicarb is low. Can't lay flax for scrotal abscess drainage. Will continue to watch and possibly have hospitalist talk to surgery or urology. Nephrology will come by to see the patient. Still awaiting cardiology consult.  Point-of-care echo shows decreased EF of approximately 30%. [RP]  0800 Discussed patient's presentation and elevated troponin with Dr Odis Hollingshead from cardiology who recommends aspirin, heparin, and a urine drug screen and to transfer the patient to [facility] in case he needs heart catheterization.  He is unable to see the patient's EKG at this time due to a technical issue with epic. [RP]  5284 Dr Chestine Spore from critical care medicine to admit.  Patient does have a D-dimer that is pending at this time.  Did discuss with them the concerns for possible PE.  Patient being started on heparin for his NSTEMI and does have an upward trending troponin. [RP]  1044 Pt taken to [facility].  [RP]    Clinical Course User Index [RP] Rondel Baton, MD   Medical Decision Making Amount and/or Complexity of Data Reviewed Labs: ordered. Radiology: ordered. ECG/medicine tests: ordered.  Risk OTC drugs. Prescription drug management. Decision regarding hospitalization.     EMERGENCY DEPARTMENT [place] CARDIAC  EXAM "Study: Limited Ultrasound of the Heart and Pericardium"  INDICATIONS:Dyspnea Multiple views of the heart and pericardium were obtained in real-time with a multi-frequency probe.  PERFORMED XL:KGMWNU IMAGES ARCHIVED?: No LIMITATIONS:  Body habitus VIEWS USED: Subcostal 4 chamber and Apical 4 chamber  INTERPRETATION: Cardiac activity present, Pericardial effusioin absent, and Decreased contractility   CRITICAL CARE Performed by: Rondel Baton   Total critical care time: 45 minutes  Critical care time was exclusive of separately billable procedures and treating other patients.  Critical care was necessary to treat or prevent imminent or life-threatening deterioration.  Critical care was time spent personally by me on the following activities: development of treatment plan with patient and/or surrogate as well as nursing, discussions with consultants, evaluation of patient's response to treatment, examination of patient, obtaining history from patient or surrogate, ordering and performing treatments and interventions, ordering and review of laboratory studies, ordering and review of radiographic studies, pulse oximetry and re-evaluation of patient's condition.     Rondel Baton, MD 05/17/23 2013

## 2023-05-17 NOTE — Consult Note (Signed)
ASSESSMENT & PLAN   END-STAGE RENAL DISEASE: We have been asked to place hemodialysis access.  He is left-handed.  His vein mapping is pending.  Pending the results of his vein map I can make further recommendations.  However currently, I do not think he is stable for surgery.  He is tachycardic with a heart rate of 120.  He has multiple issues which are being addressed.  He does have a functioning catheter which was placed today by IR.  We will follow and when he is more stable medically we can arrange for placement of new access.  REASON FOR CONSULT:    For hemodialysis access  HPI:   Zachary Lynch is a 37 y.o. male who was admitted today.  He was transferred from [facility] where he had presented with chest pain and was noted to have an acute NSTEMI.  He has a history of stage IV chronic kidney disease but this had progressed significantly.  Echocardiogram showed an EF of 30 to 35%.  He was also noted to have scrotal cellulitis.  Thus he presented today critically ill with multiple organ system failure.  He has a right IJ tunneled dialysis catheter that was placed by IR today.  He is left-handed.  Does not have a pacemaker.  Past Medical History:  Diagnosis Date   Arthritis    CKD stage 4 secondary to hypertension (HCC) 03/28/2017   Elevated blood uric acid level    Essential hypertension    Folliculitis    Managed with doxycycline, topical clindamycin/benzoyl peroxide   GERD (gastroesophageal reflux disease)    Gout    HFrEF (heart failure with reduced ejection fraction) (HCC)    Echo 2019 with EF 30-35%, hypokinesis   History of cardiomyopathy    LVEF 15% in 2010 - evaluated by [facility] and Dr. Graciela Husbands   History of stroke 2010   Right MCA distribution infarct, felt to be possibly embolic in the setting of cardiomyopathy - placed on Coumadin at that time   Hyperlipidemia    Nonischemic cardiomyopathy (HCC) 03/28/2017   Obesity    Prediabetes 05/03/2017    Family History   Problem Relation Age of Onset   Stroke Cousin    Hypertension Mother    Heart attack Mother        Died age 56   Hypertension Father     SOCIAL HISTORY: Social History   Tobacco Use   Smoking status: Never   Smokeless tobacco: Never  Substance Use Topics   Alcohol use: No    Alcohol/week: 0.0 standard drinks of alcohol    Allergies  Allergen Reactions   Albuterol Other (See Comments)    Reaction is unknown. Allergy was entered post Stroke "caused him to have a stroke"     Tramadol Nausea Only    Made patient disoriented    Current Facility-Administered Medications  Medication Dose Route Frequency Provider Last Rate Last Admin   0.9 %  sodium chloride infusion   Intravenous PRN Cheri Fowler, MD 10 mL/hr at 05/17/23 1402 New Bag at 05/17/23 1402   [START ON 05/18/2023] aspirin chewable tablet 81 mg  81 mg Oral Daily Tolia, Sunit, DO       [START ON 05/18/2023] atorvastatin (LIPITOR) tablet 20 mg  20 mg Oral Daily Tolia, Sunit, DO       Chlorhexidine Gluconate Cloth 2 % PADS 6 each  6 each Topical Q0600 Anthony Sar, MD   6 each at 05/17/23 1136  docusate sodium (COLACE) capsule 100 mg  100 mg Oral BID PRN Cloyd Stagers M, PA-C       heparin ADULT infusion 100 units/mL (25000 units/271mL)  1,300 Units/hr Intravenous Continuous Rondel Baton, MD 13 mL/hr at 05/17/23 1408 1,300 Units/hr at 05/17/23 1408   insulin aspart (novoLOG) injection 0-9 Units  0-9 Units Subcutaneous Q4H Cloyd Stagers M, PA-C       iohexol (OMNIPAQUE) 350 MG/ML injection 75 mL  75 mL Intravenous Once PRN Celine Mans, Rahul P, PA-C       magnesium sulfate IVPB 2 g 50 mL  2 g Intravenous Once Desai, Rahul P, PA-C       nitroGLYCERIN 50 mg in dextrose 5 % 250 mL (0.2 mg/mL) infusion  0-200 mcg/min Intravenous Titrated Desai, Rahul P, PA-C 1.5 mL/hr at 05/17/23 1211 5 mcg/min at 05/17/23 1211   piperacillin-tazobactam (ZOSYN) IVPB 2.25 g  2.25 g Intravenous Q8H Cheri Fowler, MD       polyethylene glycol  (MIRALAX / GLYCOLAX) packet 17 g  17 g Oral Daily PRN Cloyd Stagers M, PA-C       sodium bicarbonate tablet 1,300 mg  1,300 mg Oral TID Desai, Rahul P, PA-C        REVIEW OF SYSTEMS:  [X]  denotes positive finding, [ ]  denotes negative finding Cardiac  Comments:  Chest pain or chest pressure: x   Shortness of breath upon exertion: x   Short of breath when lying flat:    Irregular heart rhythm:        Vascular    Pain in calf, thigh, or hip brought on by ambulation:    Pain in feet at night that wakes you up from your sleep:     Blood clot in your veins:    Leg swelling:         Pulmonary    Oxygen at home:    Productive cough:     Wheezing:         Neurologic    Sudden weakness in arms or legs:     Sudden numbness in arms or legs:     Sudden onset of difficulty speaking or slurred speech:    Temporary loss of vision in one eye:     Problems with dizziness:         Gastrointestinal    Blood in stool:     Vomited blood:         Genitourinary    Burning when urinating:     Blood in urine:        Psychiatric    Major depression:         Hematologic    Bleeding problems:    Problems with blood clotting too easily:        Skin    Rashes or ulcers:        Constitutional    Fever or chills:    -  PHYSICAL EXAM:   Vitals:   05/17/23 1315 05/17/23 1320 05/17/23 1325 05/17/23 1330  BP: (!) 157/116 (!) 159/111 (!) 157/116 (!) 122/104  Pulse: (!) 120 (!) 120 (!) 123 (!) 125  Resp: (!) 23 (!) 28 (!) 22 (!) 30  Temp:      TempSrc:      SpO2: 98% 98% 100% 99%  Weight:      Height:       Body mass index is 30.68 kg/m. GENERAL: The patient is a well-nourished male, in no acute distress. The vital signs  are documented above. CARDIAC: He is tachycardic. VASCULAR: He has palpable radial pulses bilaterally. He has an IV in his left forearm. Mild bilateral lower extremity swelling. PULMONARY: There is good air exchange bilaterally without wheezing or  rales. ABDOMEN: Soft and non-tender with normal pitched bowel sounds.  MUSCULOSKELETAL: There are no major deformities. NEUROLOGIC: No focal weakness or paresthesias are detected. SKIN: There are no ulcers or rashes noted. PSYCHIATRIC: The patient has a normal affect.  DATA:    VEIN MAPPING: His upper extremity vein mapping is pending.    [place] [facility]

## 2023-05-17 NOTE — H&P (View-Only) (Signed)
CARDIOLOGY CONSULT NOTE  Patient ID: [name] MRN: [id number] DOB/AGE: 39-07-1986 39 y.o.  Admit date: 05/17/2023 Attending physician: Dr. Merrily Pew Primary Physician:  Zachary Butter, NP Outpatient Cardiology Provider: Dr. Lynnea Lynch, Dr. Diona Lynch, Dr. Gala Lynch Inpatient Cardiologist: Zachary Lerner, DO, [facility]  Reason of consultation: Elevated troponins Referring physician: Dr. Jarold Lynch  Chief complaint: Shortness of breath and scrotal abscess  HPI:  Zachary Lynch is a 37 y.o. African-American male who presents with a chief complaint of " shortness of breath and scrotal presents." His past medical history and cardiovascular risk factors include: Uncontrolled hypertension with chronic kidney disease, hyperlipidemia, presumed nonischemic cardiomyopathy, HFrEF (echo 05/2018 LVEF 30-35%), history of right MCA stroke without residual deficits, GERD, gout, obesity.  In the past has been evaluated by multiple cardiology providers as outlined above.  And has been lost to follow-up due to financial/lack of insurance.  He is also not been taking his medications for the last 3 months at least or more according to the patient.  He has developed shortness of breath that is becoming more progressive and now presents to the ED for further evaluation.  Shortness of breath more noticeable with exertion and now with conversational dyspnea.  He experiences orthopnea and lower extremity swelling but denies PND.  Review of systems are also positive for nausea, vomiting, nonproductive cough.  He denies precordial discomfort or anginal chest pain  ER physician reached out earlier this morning as the initial high sensitive troponins was 4584 in the setting of CKD stage V GFR 2 mL/min.  Initial vital signs on arrival 167/125, respiratory rate of 18, pulse 112, temperature 98.1, 100% on room air  Initial EKG illustrates sinus tachycardia with subtle TWI in the high lateral and lateral leads.   Patient was transferred to  [facility] for further evaluation and management.  At the time of the evaluation he does have conversational dyspnea but no chest pain.  He remains hypertensive and tachycardic.  ALLERGIES: Allergies  Allergen Reactions   Albuterol Other (See Comments)    Reaction is unknown. Allergy was entered post Stroke "caused him to have a stroke"     Tramadol Nausea Only    Made patient disoriented    PAST MEDICAL HISTORY: Past Medical History:  Diagnosis Date   Arthritis    CKD stage 4 secondary to hypertension (HCC) 03/28/2017   Elevated blood uric acid level    Essential hypertension    Folliculitis    Managed with doxycycline, topical clindamycin/benzoyl peroxide   GERD (gastroesophageal reflux disease)    Gout    HFrEF (heart failure with reduced ejection fraction) (HCC)    Echo 2019 with EF 30-35%, hypokinesis   History of cardiomyopathy    LVEF 15% in 2010 - evaluated by [facility] and Dr. Graciela Husbands   History of stroke 2010   Right MCA distribution infarct, felt to be possibly embolic in the setting of cardiomyopathy - placed on Coumadin at that time   Hyperlipidemia    Nonischemic cardiomyopathy (HCC) 03/28/2017   Obesity    Prediabetes 05/03/2017    PAST SURGICAL HISTORY: No past surgical history on file.  FAMILY HISTORY: The patient's family history includes Heart attack in his mother; Hypertension in his father and mother; Stroke in his cousin.   SOCIAL HISTORY:  The patient  reports that he has never smoked. He has never used smokeless tobacco. He reports that he does not drink alcohol and does not use drugs.  MEDICATIONS: Current Outpatient Medications  Medication  Instructions   allopurinol (ZYLOPRIM) 200 mg, Oral, Daily   amLODipine (NORVASC) 10 mg, Oral, Daily   aspirin EC (ASPIRIN LOW DOSE) 81 mg, Oral, Daily   atorvastatin (LIPITOR) 20 mg, Oral, Daily   carvedilol (COREG) 50 mg, Oral, 2 times daily with meals   Clindamycin-Benzoyl Per, Refr, gel 1  Application, Topical, 2 times daily   doxazosin (CARDURA) 2 mg, Oral, Daily at bedtime   famotidine (PEPCID) 40 mg, Oral, Daily   ofloxacin (OCUFLOX) 0.3 % ophthalmic solution Place 1 drop four times daily in the affected eye for 7 days. If symptoms develop, okay to use in both eyes.   pantoprazole (PROTONIX) 40 mg, Oral, Daily   predniSONE (DELTASONE) 40 mg, Oral, Daily with breakfast   predniSONE (DELTASONE) 40 mg, Oral, Daily with breakfast    REVIEW OF SYSTEMS: Review of Systems  Constitutional: Positive for malaise/fatigue and weight loss (80 pounds from September 2023 to April 2024, per patient).  Cardiovascular:  Positive for dyspnea on exertion, leg swelling and orthopnea. Negative for chest pain, claudication, irregular heartbeat, near-syncope, palpitations, paroxysmal nocturnal dyspnea and syncope.  Respiratory:  Positive for cough and shortness of breath.   Hematologic/Lymphatic: Negative for bleeding problem.  Musculoskeletal:  Negative for muscle cramps and myalgias.  Gastrointestinal:  Positive for nausea and vomiting.  Genitourinary:        Right-sided scrotal abscess  Neurological:  Negative for dizziness and light-headedness.    PHYSICAL EXAMINATION: PHYSICAL EXAM: Temp:  [97.8 F (36.6 C)-98.1 F (36.7 C)] 97.8 F (36.6 C) (08/27 1116) Pulse Rate:  [108-125] 119 (08/27 1015) Resp:  [18-33] 33 (08/27 1015) BP: (145-181)/(110-138) 159/121 (08/27 1015) SpO2:  [88 %-100 %] 90 % (08/27 1015) Weight:  [99.8 kg] 99.8 kg (08/27 0448)  Intake/Output:  Intake/Output Summary (Last 24 hours) at 05/17/2023 1207 Last data filed at 05/17/2023 1116 Gross per 24 hour  Intake 50 ml  Output 300 ml  Net -250 ml     Net IO Since Admission: -250 mL [05/17/23 1207]  Weights:     05/17/2023    4:48 AM 01/14/2023    1:37 PM 06/03/2022   10:09 AM  Last 3 Weights  Weight (lbs) 220 lb 250 lb 8 oz 274 lb  Weight (kg) 99.791 kg 113.626 kg 124.286 kg     Physical Exam   Constitutional: No distress. He appears chronically ill.  Appears older than stated age, hemodynamically stable.   Neck: JVD present.  Cardiovascular: Regular rhythm, S1 normal and S2 normal. Tachycardia present.  No murmurs rubs or gallops appreciated secondary to tachycardia.  Pulmonary/Chest: Effort normal and breath sounds normal. No stridor. He has no wheezes. He has no rales.  Abdominal: Soft. Bowel sounds are normal. He exhibits no distension. There is no abdominal tenderness.  Musculoskeletal:        General: Edema (Warm to touch, trace bilateral) present.     Cervical back: Neck supple.  Neurological: He is alert and oriented to person, place, and time. He has intact cranial nerves (2-12).  Skin: Skin is warm and moist.  Abscess over the right scrotal sac currently nondraining.  LAB RESULTS: Chemistry Recent Labs  Lab 05/17/23 0506  NA 137  K 4.0  CL 106  CO2 11*  GLUCOSE 100*  BUN 147*  CREATININE 21.61*  CALCIUM 7.6*  PROT 7.0  ALBUMIN 3.6  AST 21  ALT 14  ALKPHOS 56  BILITOT 0.4  GFRNONAA 2*  ANIONGAP 20*    Hematology Recent Labs  Lab 05/17/23 0506  WBC 18.0*  RBC 3.41*  HGB 10.4*  HCT 31.5*  MCV 92.4  MCH 30.5  MCHC 33.0  RDW 14.4  PLT 273   High Sensitivity Troponin:   Recent Labs  Lab 05/17/23 0506 05/17/23 0802 05/17/23 1000  TROPONINIHS 4,584* 5,699* 5,441*     Cardiac EnzymesNo results for input(s): "TROPONINI" in the last 168 hours. No results for input(s): "TROPIPOC" in the last 168 hours.  BNP Recent Labs  Lab 05/17/23 0506  BNP 925.0*    DDimer  Recent Labs  Lab 05/17/23 0802  DDIMER 2.14*    Hemoglobin A1c:  Lab Results  Component Value Date   HGBA1C 5.3 06/03/2022   MPG 105 06/03/2022   TSH  Recent Labs    06/03/22 0000  TSH 2.90   Lipid Panel  Lab Results  Component Value Date   CHOL 109 01/14/2023   HDL 21 (L) 01/14/2023   LDLCALC 68 01/14/2023   TRIG 118 01/14/2023   CHOLHDL 5.2 (H) 01/14/2023    Drugs of Abuse     Component Value Date/Time   LABOPIA POSITIVE (A) 09/27/2008 1343   COCAINSCRNUR NONE DETECTED 09/27/2008 1343   LABBENZ NONE DETECTED 09/27/2008 1343   AMPHETMU NONE DETECTED 09/27/2008 1343   THCU NONE DETECTED 09/27/2008 1343   LABBARB  09/27/2008 1343    NONE DETECTED        DRUG SCREEN FOR MEDICAL PURPOSES ONLY.  IF CONFIRMATION IS NEEDED FOR ANY PURPOSE, NOTIFY LAB WITHIN 5 DAYS.        LOWEST DETECTABLE LIMITS FOR URINE DRUG SCREEN Drug Class       Cutoff (ng/mL) Amphetamine      1000 Barbiturate      200 Benzodiazepine   200 Tricyclics       300 Opiates          300 Cocaine          300 THC              50      CARDIAC DATABASE: EKG: 05/17/2023:. 6:52 AM: Sinus tachycardia, 114 bpm, TWI in the high lateral and lateral leads consider lateral ischemia, prolonged QT. 1157: Sinus tachycardia, 110 bpm, LVH per voltage criteria, ST-T changes likely secondary to LVH.  Echocardiogram: Per EMR: LVEF 15% in 2010. LVEF 25% in 2016. LVEF 30-35% 2019  Echocardiogram pending   Scheduled Meds:  Chlorhexidine Gluconate Cloth  6 each Topical Q0600   insulin aspart  0-9 Units Subcutaneous Q4H    Continuous Infusions:  heparin 1,300 Units/hr (05/17/23 0953)   nitroGLYCERIN      PRN Meds: docusate sodium, polyethylene glycol  IMPRESSION & RECOMMENDATIONS: DONLD LUEBBE is a 37 y.o. African-American male whose past medical history and cardiovascular risk factors include: Uncontrolled hypertension with chronic kidney disease, hyperlipidemia, presumed nonischemic cardiomyopathy, HFrEF (echo 05/2018 LVEF 30-35%), history of right MCA stroke without residual deficits, GERD, gout, obesity.  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 Prolonged QT Hypomagnesemia Hypertensive crisis on admission. Leukocytosis with scrotal infection History of right MCA stroke in 2010 without residual deficits  Plan:  NSTEMI: No active precordial discomfort or anginal chest pain. High sensitive troponins peaked at 5699 (chronic kidney disease stage V with GFR of 2 mL/min) Subtle  TWI in the high lateral and lateral leads concerning for possible ischemia. Start IV nitro drip for blood pressure management/afterload reduction Volume management per nephrology Tentatively scheduled for left and right heart catheterization once started on dialysis and has had treatment and is able to lay flat. Sooner if he develops STEMI or has anginal pain.  Patient is agreeable with the plan of care. Patient would like to remain full code and next of kin is his sister Mostyn Bueter.  Check hemoglobin A1c, fasting lipids, direct LDL, LP(a) Echo ordered.  ASA already given at [facility].  Remain on telemetry and obtain a stat EKG should chest pain return or change in character/quality.  Keep NPO at midnight and will plan for LHC in the AM.  Should symptoms of chest pain continue despite 3 sublingual nitroglycerin tablets, the patient becomes hemodynamically unstable (hypotension, congestive heart failure), or develop electrical instability (VT/VF), please notify cardiology on call for urgent evaluation.  Our service will follow along, please call with questions.    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 Volume management per primary team. Will hold off on GDMT given the degree of uremia and renal function BUN 147 and Cr 21.61 Will uptitrate prior to discharge.  Prolonged QT: Hypomagnesemia: Electrolyte replacement per  primary team.  Chronic kidney disease stage V not on hemodialysis Evaluated by nephrology. Tentatively scheduled for catheter placement by IR later today Once he is dialyzed by and volume status /  uremia / is able to lay flat and cleared by nephrology will proceed with elective left and right catheterization.  Hypertensive crisis on admission: Start IV nitro drip. May use hydralazine as needed for blood pressure management. Avoid beta-blocker therapy Blood pressure should improve with volume management.  History of right MCA stroke in 2010 without residual deficits: Reemphasized the importance of secondary prevention with focus on improving her modifiable cardiovascular risk factors such as glycemic control, lipid management, blood pressure control, weight loss.  CRITICAL CARE Performed by: Zachary Lynch   Total critical care time: 47 minutes   Critical care time was exclusive of separately billable procedures and treating other patients.   Critical care was necessary to treat or prevent imminent or life-threatening deterioration due to  NSTEMI, cardiomyopathy, acute HFrEF, and CKD stage 5 likely needing dialysis.    Critical care was time spent personally by me on the following activities: development of treatment plan with patient and/or surrogate as well as nursing, discussions with consultants, evaluation of patient's response to treatment, examination of patient, obtaining history from patient or surrogate, ordering and performing treatments and interventions, ordering and review of laboratory studies, ordering and review of radiographic studies, pulse oximetry and re-evaluation of patient's condition.  Plan of care discussed with ED physician earlier this morning, intensivist, and critical care team.  Patient's questions and concerns were addressed to his satisfaction. He voices understanding of the instructions provided during this encounter.   This note was created using a voice  recognition software as a result there may be grammatical errors inadvertently enclosed that do not reflect the nature of this encounter. Every attempt is made to correct such errors.  Delilah Shan [facility]  Pager:  [phone] Office: [phone] 05/17/2023, 12:07 PM

## 2023-05-17 NOTE — Progress Notes (Signed)
  Echocardiogram 2D Echocardiogram has been performed.  [name] 05/17/2023, 4:01 PM

## 2023-05-17 NOTE — ED Notes (Signed)
ED TO INPATIENT HANDOFF REPORT  ED Nurse Name and Phone #:  Neldon Mc RN [phone]  S Name/Age/Gender Ramond Dial 37 y.o. male Room/Bed: APA06/APA06  Code Status   Code Status: Not on file  Home/SNF/Other Home Patient oriented to: self, place, time, and situation Is this baseline? Yes   Triage Complete: Triage complete  Chief Complaint NSTEMI (non-ST elevated myocardial infarction) [facility]) [I21.4]  Triage Note Pt c/o emesis x4 weeks, states he was seen at his PCP about a month ago and was told his lipase was elevated and to come to ED to be evaluated. Pt reports he decided to come in tonight because he feels like he is having trouble breathing and is still having episodes of emesis.    Allergies Allergies  Allergen Reactions   Albuterol Other (See Comments)    Reaction is unknown. Allergy was entered post Stroke "caused him to have a stroke"     Tramadol Nausea Only    Made patient disoriented    Level of Care/Admitting Diagnosis ED Disposition     ED Disposition  Admit   Condition  --   Comment  Hospital Area: [facility] [[id number]]  Level of Care: ICU [6]  May admit patient to [facility] or [facility] if equivalent level of care is available:: No  Interfacility transfer: Yes  Covid Evaluation: Confirmed COVID Negative  Diagnosis: NSTEMI (non-ST elevated myocardial infarction) [facility]) [[id number]]  Admitting Physician: Steffanie Dunn [[id number]]  Attending Physician: Rondel Baton [[id number]]  Certification:: I certify this patient will need inpatient services for at least 2 midnights  Expected Medical Readiness: 05/25/2023          B Medical/Surgery History Past Medical History:  Diagnosis Date   Arthritis    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 Graciela Husbands   History of stroke 2010   Right MCA distribution infarct, felt to be possibly embolic in the setting of cardiomyopathy - placed on Coumadin at that time   Hyperlipidemia    Nonischemic cardiomyopathy (HCC) 03/28/2017   Obesity    Prediabetes 05/03/2017   No past surgical history on file.   A IV Location/Drains/Wounds Patient Lines/Drains/Airways Status     Active Line/Drains/Airways     Name Placement date Placement time Site Days   Peripheral IV 05/17/23 22 G Posterior;Right Hand 05/17/23  0517  Hand  less than 1   Peripheral IV 05/17/23 Anterior;Proximal;Right Forearm 05/17/23  0941  Forearm  less than 1            Intake/Output Last 24 hours  Intake/Output Summary (Last 24 hours) at 05/17/2023 1030 Last data filed at 05/17/2023 0936 Gross per 24 hour  Intake 50 ml  Output --  Net 50 ml    Labs/Imaging Results for orders placed or performed during the hospital encounter of 05/17/23 (from the past 48 hour(s))  Lipase, blood     Status: Abnormal   Collection Time: 05/17/23  5:06 AM  Result Value Ref Range   Lipase 116 (H) 11 - 51 U/L    Comment: Performed at [facility], [address] [place], [place] [address]  Comprehensive metabolic panel     Status: Abnormal   Collection Time: 05/17/23  5:06  AM  Result Value Ref Range   Sodium 137 135 - 145 mmol/L   Potassium 4.0 3.5 - 5.1 mmol/L   Chloride 106 98 - 111 mmol/L   CO2 11 (L) 22 - 32 mmol/L   Glucose, Bld 100 (H) 70 - 99 mg/dL    Comment: Glucose reference range applies only to samples taken after fasting for at least 8 hours.   BUN 147 (H) 6 - 20 mg/dL    Comment: RESULT CONFIRMED BY MANUAL DILUTION   Creatinine, Ser 21.61 (H) 0.61 - 1.24 mg/dL   Calcium 7.6 (L) 8.9 - 10.3 mg/dL   Total Protein 7.0 6.5 - 8.1 g/dL   Albumin 3.6 3.5 - 5.0 g/dL   AST 21 15 - 41 U/L   ALT 14 0 - 44 U/L   Alkaline Phosphatase 56 38 - 126 U/L   Total Bilirubin 0.4 0.3 - 1.2 mg/dL   GFR, Estimated 2 (L) >60  mL/min    Comment: (NOTE) Calculated using the CKD-EPI Creatinine Equation (2021)    Anion gap 20 (H) 5 - 15    Comment: Performed at [facility], [address], [place], [place] [address]  CBC     Status: Abnormal   Collection Time: 05/17/23  5:06 AM  Result Value Ref Range   WBC 18.0 (H) 4.0 - 10.5 K/uL   RBC 3.41 (L) 4.22 - 5.81 MIL/uL   Hemoglobin 10.4 (L) 13.0 - 17.0 g/dL   HCT 19.1 (L) 47.8 - 29.5 %   MCV 92.4 80.0 - 100.0 fL   MCH 30.5 26.0 - 34.0 pg   MCHC 33.0 30.0 - 36.0 g/dL   RDW 62.1 30.8 - 65.7 %   Platelets 273 150 - 400 K/uL   nRBC 0.1 0.0 - 0.2 %    Comment: Performed at [facility], [address], [place], [place] [address]  Urinalysis, Routine w reflex microscopic -Urine, Clean Catch     Status: Abnormal   Collection Time: 05/17/23  5:06 AM  Result Value Ref Range   Color, Urine STRAW (A) YELLOW   APPearance CLEAR CLEAR   Specific Gravity, Urine 1.010 1.005 - 1.030   pH 5.0 5.0 - 8.0   Glucose, UA 50 (A) NEGATIVE mg/dL   Hgb urine dipstick MODERATE (A) NEGATIVE   Bilirubin Urine NEGATIVE NEGATIVE   Ketones, ur NEGATIVE NEGATIVE mg/dL   Protein, ur 295 (A) NEGATIVE mg/dL   Nitrite NEGATIVE NEGATIVE   Leukocytes,Ua NEGATIVE NEGATIVE   RBC / HPF 0-5 0 - 5 RBC/hpf   WBC, UA 0-5 0 - 5 WBC/hpf   Bacteria, UA RARE (A) NONE SEEN   Squamous Epithelial / HPF 0-5 0 - 5 /HPF   Mucus PRESENT     Comment: Performed at [facility], [address] [place], [place] [address]  Brain natriuretic peptide     Status: Abnormal   Collection Time: 05/17/23  5:06 AM  Result Value Ref Range   B Natriuretic Peptide 925.0 (H) 0.0 - 100.0 pg/mL    Comment: Performed at [facility], [address], [place], [place] [address]  Troponin I (High Sensitivity)     Status: Abnormal   Collection Time: 05/17/23  5:06 AM  Result Value Ref Range   Troponin I (High Sensitivity) 4,584 (HH) <18 ng/L    Comment: CRITICAL RESULT CALLED TO, READ BACK BY AND VERIFIED WITH OLDLAND,B AT 6:25AM  ON 05/17/23 BY FESTERMAN,C (NOTE) Elevated high sensitivity troponin I (hsTnI) values and significant  changes  across serial measurements may suggest ACS but many other  chronic and acute conditions are known to elevate hsTnI results.  Refer to the "Links" section for chest pain algorithms and additional  guidance. Performed at [facility], [address], [place], [place] [address]   Protime-INR     Status: Abnormal   Collection Time: 05/17/23  8:02 AM  Result Value Ref Range   Prothrombin Time 16.0 (H) 11.4 - 15.2 seconds   INR 1.3 (H) 0.8 - 1.2    Comment: (NOTE) INR goal varies based on device and disease states. Performed at [facility], [address], [place], [place] [address]   Magnesium     Status: Abnormal   Collection Time: 05/17/23  8:02 AM  Result Value Ref Range   Magnesium 1.6 (L) 1.7 - 2.4 mg/dL    Comment: Performed at [facility], [address], [place], [place] [address]  Phosphorus     Status: Abnormal   Collection Time: 05/17/23  8:02 AM  Result Value Ref Range   Phosphorus 10.5 (H) 2.5 - 4.6 mg/dL    Comment: Performed at [facility], [address], [place], [place] [address]  Troponin I (High Sensitivity)     Status: Abnormal   Collection Time: 05/17/23  8:02 AM  Result Value Ref Range   Troponin I (High Sensitivity) 5,699 (HH) <18 ng/L    Comment: CRITICAL RESULT CALLED TO, READ BACK BY AND VERIFIED WITH TALBOT,T AT 8:30AM ON  05/17/23 BY FESTERMAN,C (NOTE) Elevated high sensitivity troponin I (hsTnI) values and significant  changes across serial measurements may suggest ACS but many other  chronic and acute conditions are known to elevate hsTnI results.  Refer to the "Links" section for chest pain algorithms and additional  guidance. Performed at [facility], [address] [place], [place] [address]   Blood gas, venous (at [facility] and [facility])     Status: Abnormal   Collection Time: 05/17/23  8:02 AM  Result Value Ref Range   pH, Ven 7.27 7.25 - 7.43    pCO2, Ven 25 (L) 44 - 60 mmHg   pO2, Ven 59 (H) 32 - 45 mmHg   Bicarbonate 11.5 (L) 20.0 - 28.0 mmol/L   Acid-base deficit 13.6 (H) 0.0 - 2.0 mmol/L   O2 Saturation 91.3 %   Patient temperature 36.7    Collection site LEFT ANTECUBITAL    Drawn by [phone]     Comment: Performed at [facility], [address], [place], [place] [address]  D-dimer, quantitative     Status: Abnormal   Collection Time: 05/17/23  8:02 AM  Result Value Ref Range   D-Dimer, Quant 2.14 (H) 0.00 - 0.50 ug/mL-FEU    Comment: (NOTE) At the manufacturer cut-off value of 0.5 g/mL FEU, this assay has a negative predictive value of 95-100%.This assay is intended for use in conjunction with a clinical pretest probability (PTP) assessment model to exclude pulmonary embolism (PE) and deep venous thrombosis (DVT) in outpatients suspected of PE or DVT. Results should be correlated with clinical presentation. Performed at [facility], [address], [place], [place] [address]   Iron and TIBC     Status: Abnormal   Collection Time: 05/17/23  8:02 AM  Result Value Ref Range   Iron 55 45 - 182 ug/dL   TIBC 102 (L) 725 - 366 ug/dL   Saturation Ratios 35 17.9 - 39.5 %   UIBC 101 ug/dL    Comment: Performed at [facility], [address] [place], [place] [address]  Ferritin     Status: None   Collection Time: 05/17/23  8:02 AM  Result Value Ref Range   Ferritin 47 24 - 336 ng/mL    Comment: Performed at [facility], [address] [place], [place] [address]   DG Chest 2 View  Result Date: 05/17/2023 CLINICAL DATA:  Shortness of breath and congestion. Cough and emesis. Recent diagnosis of pancreatitis. EXAM: CHEST - 2 VIEW COMPARISON:  10/18/20 FINDINGS: The heart size and mediastinal contours are within normal limits. Both lungs are clear. The visualized skeletal structures are unremarkable. IMPRESSION: No active cardiopulmonary disease. Electronically Signed   By: Signa Kell M.D.   On: 05/17/2023 06:28    Pending  Labs Unresulted Labs (From admission, onward)     Start     Ordered   05/18/23 0500  Heparin level (unfractionated)  Daily,   R      05/17/23 0858   05/18/23 0500  CBC  Daily,   R      05/17/23 0858   05/17/23 1600  Heparin level (unfractionated)  Once-Timed,   URGENT        05/17/23 0858   05/17/23 0953  Hepatitis B surface antigen  (New Admission Hemo Labs (Hepatitis B))  Once,   R        05/17/23 0954   05/17/23 0953  Hepatitis B surface antibody,quantitative  (New Admission Hemo Labs (Hepatitis B))  Once,   R        05/17/23 0954   05/17/23 0843  Parathyroid hormone, intact (no Ca)  Once,   URGENT        05/17/23 0842   05/17/23 0981  Rapid urine drug screen (hospital performed)  ONCE - STAT,   STAT        05/17/23 1914   Signed and Held  HIV Antibody (routine testing w rflx)  (HIV Antibody (Routine testing w reflex) panel)  Once,   R        Signed and Held   Signed and Held  Comprehensive metabolic panel  Once,   R        Signed and Held   Medical illustrator and Held  Magnesium  Once,   R        Signed and Held   Signed and Held  Phosphorus  Once,   R        Signed and Held   Signed and Held  Lactic acid, plasma  (Lactic Acid)  STAT Now then every 3 hours,   R      Signed and Held   Signed and Held  CBC with Differential/Platelet  Once,   R        Signed and Held   Signed and Held  Type and screen If need to transfuse blood products please use the blood administration order set  Once,   R       Comments: If need to transfuse blood products please use the blood administration order set    Signed and Held   Signed and Held  CBC  Tomorrow morning,   R        Signed and Held   Signed and Held  Basic metabolic panel  Tomorrow morning,   R        Signed and Held   Signed and Held  Magnesium  Tomorrow morning,   R        Signed and Held   Signed and Held  Phosphorus  Tomorrow morning,   R        Signed and Held   Signed and Held  Hemoglobin A1c  (Glycemic Control (SSI)  Q 4 Hours /  Glycemic Control (SSI)  AC +/- HS)  Once,   R       Comments: To assess prior glycemic control    Signed and Held   Signed and Held  Renal function panel  Once,   R        Signed and Held   Signed and Held  CBC  Once,   R        Signed and Held            Vitals/Pain Today's Vitals   05/17/23 0900 05/17/23 0915 05/17/23 1000 05/17/23 1015  BP: (!) 168/124 (!) 164/117 (!) 145/110 (!) 159/121  Pulse: (!) 111 (!) 117 (!) 125 (!) 119  Resp: (!) 22 (!) 29 (!) 30 (!) 33  Temp:      TempSrc:      SpO2: 95% 95% 94% 90%  Weight:      Height:      PainSc:        Isolation Precautions No active isolations  Medications Medications  magnesium sulfate IVPB 2 g 50 mL (2 g Intravenous New Bag/Given 05/17/23 0951)  heparin ADULT infusion 100 units/mL (25000 units/27mL) (1,300 Units/hr Intravenous New Bag/Given 05/17/23 0953)  Chlorhexidine Gluconate Cloth 2 % PADS 6 each (has no administration in time range)  oxyCODONE-acetaminophen (PERCOCET/ROXICET) 5-325 MG per tablet 2 tablet (2 tablets Oral Given 05/17/23 0637)  cefTRIAXone (ROCEPHIN) 2 g in sodium chloride 0.9 % 100 mL IVPB (0 g Intravenous Stopped 05/17/23 0724)  lidocaine HCl (PF) (XYLOCAINE) 2 % injection 10 mL (10 mLs Infiltration Given by Other 05/17/23 4098)  LORazepam (ATIVAN) 2 MG/ML injection (1 mg Intravenous Given 05/17/23 0711)  famotidine (PEPCID) IVPB 20 mg premix (0 mg Intravenous Stopped 05/17/23 0936)  aspirin chewable tablet 324 mg (324 mg Oral Given 05/17/23 0946)  heparin bolus via infusion 4,000 Units (4,000 Units Intravenous Bolus from Bag 05/17/23 0953)    Mobility walks with person assist     Focused Assessments    R Recommendations: See Admitting Provider Note  Report given to:  Cornelius Moras RN  Additional Notes:

## 2023-05-17 NOTE — Progress Notes (Signed)
   PCCM transfer request    Sending physician: Eloise Harman  Sending facility: [facility]  Reason for transfer: needs to start HD, NSTEMI, scrotal infection  Brief case summary: SOB, increasing troponin, cannot lay flat to get HD catheter placed. Has known CKD/ ESRD, but had not yet started HD. Still makes urine. Dr. Odis Hollingshead recommended admission to [facility].   Recommendations made prior to transfer:   Transfer accepted: yes    Steffanie Dunn 05/17/23 9:18 AM [facility]  For contact information, see Amion. If no response to pager, please call PCCM consult pager. After hours, 7PM- 7AM, please call Elink.

## 2023-05-17 NOTE — ED Triage Notes (Addendum)
Pt c/o emesis x4 weeks, states he was seen at his PCP about a month ago and was told his lipase was elevated and to come to ED to be evaluated. Pt reports he decided to come in tonight because he feels like he is having trouble breathing and is still having episodes of emesis.

## 2023-05-17 NOTE — Consult Note (Addendum)
Chief Complaint: Patient was seen in consultation today for ESRD  Referring Physician(s): Desai,Rahul P  Supervising Physician: [name]  Patient Status: [facility] - In-pt  History of Present Illness: Zachary Lynch is a 37 y.o. male with past medical history of GERD, HLD, CKD 4 who presented to [facility] with acute shortness of breath, emesis, scrotal infection, non-compliance with medications due to lack of insurance.  Patient found to have ESRD in need of HD. Also with NSTEMI and rising troponins. Patient with urgent need for HD for likely cardiac catheterization. Patient cannot lie flat, not likely to tolerate mutliple procedures in current condition.  Therefore CCM/Nephrology requesting tunn HD cath.   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 Graciela Husbands   History of stroke 2010   Right MCA distribution infarct, felt to be possibly embolic in the setting of cardiomyopathy - placed on Coumadin at that time   Hyperlipidemia    Nonischemic cardiomyopathy (HCC) 03/28/2017   Obesity    Prediabetes 05/03/2017    History reviewed. No pertinent surgical history.  Allergies: Albuterol and Tramadol  Medications: Prior to Admission medications   Medication Sig Start Date End Date Taking? Authorizing Provider  allopurinol (ZYLOPRIM) 100 MG tablet Take 2 tablets (200 mg total) by mouth daily. 01/14/23   [name], NP  amLODipine (NORVASC) 10 MG tablet Take 1 tablet (10 mg total) by mouth daily. 01/14/23   [name], NP  aspirin (ASPIRIN LOW DOSE) 81 MG EC  tablet Take 1 tablet (81 mg total) by mouth daily. 12/15/21   [name], NP  atorvastatin (LIPITOR) 20 MG tablet Take 1 tablet (20 mg total) by mouth daily. 12/15/21   [name], NP  carvedilol (COREG) 25 MG tablet Take 2 tablets (50 mg total) by mouth 2 (two) times daily with a meal. 01/14/23   [name], NP  Clindamycin-Benzoyl Per, Refr, gel Apply 1 Application topically 2 (two) times daily. 01/14/23   [name], NP  doxazosin (CARDURA) 2 MG tablet Take 1 tablet (2 mg total) by mouth at bedtime. 12/15/21   [name], NP  famotidine (PEPCID) 40 MG tablet Take 1 tablet (40 mg total) by mouth daily. 01/14/23   [name], NP  ofloxacin (OCUFLOX) 0.3 % ophthalmic solution Place 1 drop four times daily in the affected eye for 7 days. If symptoms develop, okay to use in both eyes. 04/13/23   [name], NP  pantoprazole (PROTONIX) 40 MG tablet Take 1 tablet (40 mg total) by mouth daily. 01/14/23   [name], NP  predniSONE (DELTASONE) 20 MG tablet Take 2 tablets (40 mg total) by mouth daily with breakfast. 05/12/23   [name], NP  predniSONE (DELTASONE) 20 MG tablet Take 2 tablets (40 mg total) by mouth daily with breakfast for 7 days. 05/12/23 05/19/23  Viviano Simas, FNP     Family History  Problem Relation Age of Onset   Stroke Cousin    Hypertension Mother    Heart attack Mother  Died age 56   Hypertension Father     Social History   Socioeconomic History   Marital status: Single    Spouse name: Not on file   Number of children: Not on file   Years of education: Not on file   Highest education level: Associate degree: occupational, Scientist, product/process development, or vocational program  Occupational History   Not on file  Tobacco Use   Smoking status: Never   Smokeless tobacco: Never  Vaping Use   Vaping status: Never Used  Substance and Sexual Activity   Alcohol use: No    Alcohol/week: 0.0 standard drinks of alcohol   Drug use: No   Sexual activity: Not Currently  Other Topics  Concern   Not on file  Social History Narrative   Not on file   Social Determinants of Health   Financial Resource Strain: Low Risk  (01/13/2023)   Overall Financial Resource Strain (CARDIA)    Difficulty of Paying Living Expenses: Not hard at all  Food Insecurity: No Food Insecurity (01/13/2023)   Hunger Vital Sign    Worried About Running Out of Food in the Last Year: Never true    Ran Out of Food in the Last Year: Never true  Transportation Needs: No Transportation Needs (01/13/2023)   PRAPARE - Administrator, Civil Service (Medical): No    Lack of Transportation (Non-Medical): No  Physical Activity: Unknown (01/13/2023)   Exercise Vital Sign    Days of Exercise per Week: 0 days    Minutes of Exercise per Session: Not on file  Stress: Stress Concern Present (01/13/2023)   [employer] of Occupational Health - Occupational Stress Questionnaire    Feeling of Stress : To some extent  Social Connections: Moderately Integrated (01/13/2023)   Social Connection and Isolation Panel [NHANES]    Frequency of Communication with Friends and Family: More than three times a week    Frequency of Social Gatherings with Friends and Family: Twice a week    Attends Religious Services: More than 4 times per year    Active Member of [employer] or Organizations: Yes    Attends Engineer, structural: More than 4 times per year    Marital Status: Never married     Review of Systems: A 12 point ROS discussed and pertinent positives are indicated in the HPI above.  All other systems are negative.  Review of Systems  Constitutional:  Negative for fatigue and fever.  Respiratory:  Positive for cough and shortness of breath.   Cardiovascular:  Positive for chest pain.  Gastrointestinal:  Negative for abdominal pain, nausea and vomiting.  Genitourinary:  Positive for scrotal swelling and testicular pain.  Musculoskeletal:  Negative for back pain.  Psychiatric/Behavioral:  Negative for  behavioral problems and confusion.     Vital Signs: BP (!) 159/121   Pulse (!) 119   Temp 97.8 F (36.6 C) (Axillary)   Resp (!) 33   Ht 5\' 11"  (1.803 m)   Wt 220 lb (99.8 kg)   SpO2 90%   BMI 30.68 kg/m   Physical Exam Vitals and nursing note reviewed.  Constitutional:      General: He is not in acute distress.    Appearance: Normal appearance. He is not ill-appearing.  HENT:     Mouth/Throat:     Mouth: Mucous membranes are moist.     Pharynx: Oropharynx is clear.  Cardiovascular:     Rate and Rhythm: Tachycardia present.  Pulses: Normal pulses.  Pulmonary:     Effort: Respiratory distress present.  Abdominal:     General: Abdomen is flat.     Palpations: Abdomen is soft.  Musculoskeletal:     Cervical back: Normal range of motion.  Skin:    General: Skin is warm and dry.  Neurological:     General: No focal deficit present.     Mental Status: He is alert and oriented to person, place, and time. Mental status is at baseline.  Psychiatric:        Mood and Affect: Mood normal.        Behavior: Behavior normal.        Thought Content: Thought content normal.        Judgment: Judgment normal.      MD Evaluation Airway: WNL Heart: Other (comments) Heart  comments: NSTEMI, elevated troponins Abdomen: WNL Chest/ Lungs: WNL ASA  Classification: 3 Mallampati/Airway Score: Two   Imaging: DG Chest 2 View  Result Date: 05/17/2023 CLINICAL DATA:  Shortness of breath and congestion. Cough and emesis. Recent diagnosis of pancreatitis. EXAM: CHEST - 2 VIEW COMPARISON:  10/18/20 FINDINGS: The heart size and mediastinal contours are within normal limits. Both lungs are clear. The visualized skeletal structures are unremarkable. IMPRESSION: No active cardiopulmonary disease. Electronically Signed   By: Signa Kell M.D.   On: 05/17/2023 06:28    Labs:  CBC: Recent Labs    06/03/22 0000 01/14/23 1419 05/17/23 0506  WBC 9.7 19.5* 18.0*  HGB 11.8* 10.1* 10.4*   HCT 35.5* 30.3* 31.5*  PLT 241 248 273    COAGS: Recent Labs    05/17/23 0802  INR 1.3*    BMP: Recent Labs    06/03/22 0000 01/14/23 1419 05/17/23 0506  NA 139 137 137  K 5.2 5.5* 4.0  CL 109 105 106  CO2 18* 14* 11*  GLUCOSE 81 77 100*  BUN 60* 115* 147*  CALCIUM 9.2 8.2* 7.6*  CREATININE 12.5* 16.54* 21.61*  GFRNONAA  --   --  2*    LIVER FUNCTION TESTS: Recent Labs    06/03/22 0000 01/14/23 1419 05/17/23 0506  BILITOT 0.3 0.4 0.4  AST 15 13 21   ALT 14 14 14   ALKPHOS  --   --  56  PROT 7.2 6.6 7.0  ALBUMIN  --   --  3.6    TUMOR MARKERS: No results for input(s): "AFPTM", "CEA", "CA199", "CHROMGRNA" in the last 8760 hours.  Assessment and Plan: ESRD in need of urgent dialysis Patient admitted from [facility] with ESRD, scrotal infection.  Also found to have NSTEMI in need of cardiac catheterization.  Urgent dialysis needed.  Tunneled dialysis catheter requested.  Discussed with CCM, bedside RNs.  NPO today  Light vs. No sedation secondary to ACS  Patient aware of his acute illness and high risk.  Agreeable to proceed.  Risks and benefits discussed with the patient including, but not limited to bleeding, infection, vascular injury, pneumothorax which may require chest tube placement, air embolism or even death  All of the patient's questions were answered, patient is agreeable to proceed. Consent signed and in chart.   Thank you for this interesting consult.  I greatly enjoyed meeting YASHUA GANESAN and look forward to participating in their care.  A copy of this report was sent to the requesting provider on this date.  Electronically Signed: Hoyt Koch, PA 05/17/2023, 12:35 PM   I spent a total of 40 Minutes  in face to face in clinical consultation, greater than 50% of which was counseling/coordinating care for ESRD, NSTEMI

## 2023-05-17 NOTE — Progress Notes (Signed)
ON-CALL CARDIOLOGY 05/17/23  Patient's name: Zachary Lynch.   MRN: [id number].    DOB: 20-Oct-1985 Primary care provider: [name], NP. Primary cardiologist: None  Referring physician: Dr. Rolly Pancake  Interaction regarding this patient's care today: Patient presents to the hospital due to shortness of breath, nausea, vomiting.  He has chronic kidney disease stage V not on dialysis as of yet and has been lost to follow-up with  multiple disciplines of medicine due to financial limitations over the last several years.  Cardiology was consulted due to elevated troponins.  Patient has not been experiencing chest pain but dyspnea over the last 24 to 48 hours.  Initial EKG shows sinus tachycardia with TWI in the lateral and high lateral leads.  Vital signs on arrival 167/125, respiratory rate 18, pulse 112, temperature 98.1, 100% on room air.  Initial hs troponin 4584 GFR 2 mL/min  ED physician did point-of-care echo at bedside which notes reduced LVEF   Impression: NSTEMI Elevated troponins: Likely secondary to progressive chronic kidney disease stage V, supply/demand ischemia, hypertensive crisis on arrival, tachycardia, etc. Dyspnea on exertion: Differential diagnosis includes but not limited to: ESRD not on hemodialysis, cardiac substrate, hypertensive crisis, etc. Chronic kidney disease stage V not on hemodialysis. Hypertensive crisis Leukocytosis. 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   EKG does not meet STEMI criteria and therefore we will hold off on emergent left heart catheterization.  Given his history of nonischemic cardiomyopathy, uncontrolled hypertension, and young age he  likely has worsening of his nonischemic cardiomyopathy.  However, ischemic substrate cannot be entirely ruled out.   Given initial troponin of 4584 w/ GFR 2 ml/min, subtle TWI in the lateral/high lateral leads, recommend IV heparin per ACS protocol if no contraindications for bleeding.  Continue to trend troponins.  Formal echocardiogram to evaluate for LVEF, regional wall motion abnormalities.  Patient warrants a consult with nephrology given his CKD stage V/ ESRD and discuss the need /timing of hemodialysis.  Once he is on hemodialysis we will discuss the timing of left and right heart catheterization with the patient and care team.  Start IV nitroglycerin drip for afterload reduction and help w/ pulmonary edema.   Volume management per nephrology.   Will follow once he arrives to [facility]. Full consult forthcoming.   If patient is going to be started on hemodialysis during this hospitalization recommend left and right heart catheterization   Delilah Shan [facility]  Pager:  [phone] Office: [phone]

## 2023-05-17 NOTE — Progress Notes (Addendum)
Brief nephrology note:  Patient was transferred from [facility] to [facility].  The nephrology consult was already done by Dr. Thedore Mins this morning.  Please see the consult note for details.  I have seen and examined the patient in [facility] [place].  I discussed with the cardiology and critical care team.  The uremic symptoms is chronic in nature.  We will contact IR to see if they can put tunneled HD catheter today versus tomorrow.  Plan to do dialysis after tunneled HD catheter.  I informed vascular surgery team for permanent access placement.  Vein mapping ordered. Also informed renal navigator to arrange outpatient dialysis.  I will continue to follow.

## 2023-05-17 NOTE — Consult Note (Signed)
CARDIOLOGY CONSULT NOTE  Patient ID: Zachary Lynch MRN: [id number] DOB/AGE: 39-07-1986 39 y.o.  Admit date: 05/17/2023 Attending physician: Dr. Merrily Lynch Primary Physician:  Zachary Butter, NP Outpatient Cardiology Provider: Dr. Lynnea Lynch, Dr. Diona Lynch, Dr. Gala Lynch Inpatient Cardiologist: Zachary Lerner, DO, [facility]  Reason of consultation: Elevated troponins Referring physician: Dr. Jarold Lynch  Chief complaint: Shortness of breath and scrotal abscess  HPI:  Zachary Lynch is a 37 y.o. African-American male who presents with a chief complaint of " shortness of breath and scrotal presents." His past medical history and cardiovascular risk factors include: Uncontrolled hypertension with chronic kidney disease, hyperlipidemia, presumed nonischemic cardiomyopathy, HFrEF (echo 05/2018 LVEF 30-35%), history of right MCA stroke without residual deficits, GERD, gout, obesity.  In the past has been evaluated by multiple cardiology providers as outlined above.  And has been lost to follow-up due to financial/lack of insurance.  He is also not been taking his medications for the last 3 months at least or more according to the patient.  He has developed shortness of breath that is becoming more progressive and now presents to the ED for further evaluation.  Shortness of breath more noticeable with exertion and now with conversational dyspnea.  He experiences orthopnea and lower extremity swelling but denies PND.  Review of systems are also positive for nausea, vomiting, nonproductive cough.  He denies precordial discomfort or anginal chest pain  ER physician reached out earlier this morning as the initial high sensitive troponins was 4584 in the setting of CKD stage V GFR 2 mL/min.  Initial vital signs on arrival 167/125, respiratory rate of 18, pulse 112, temperature 98.1, 100% on room air  Initial EKG illustrates sinus tachycardia with subtle TWI in the high lateral and lateral leads.   Patient was transferred to  [facility] for further evaluation and management.  At the time of the evaluation he does have conversational dyspnea but no chest pain.  He remains hypertensive and tachycardic.  ALLERGIES: Allergies  Allergen Reactions   Albuterol Other (See Comments)    Reaction is unknown. Allergy was entered post Stroke "caused him to have a stroke"     Tramadol Nausea Only    Made patient disoriented    PAST MEDICAL HISTORY: Past Medical History:  Diagnosis Date   Arthritis    CKD stage 4 secondary to hypertension (HCC) 03/28/2017   Elevated blood uric acid level    Essential hypertension    Folliculitis    Managed with doxycycline, topical clindamycin/benzoyl peroxide   GERD (gastroesophageal reflux disease)    Gout    HFrEF (heart failure with reduced ejection fraction) (HCC)    Echo 2019 with EF 30-35%, hypokinesis   History of cardiomyopathy    LVEF 15% in 2010 - evaluated by [facility] and Dr. Graciela Husbands   History of stroke 2010   Right MCA distribution infarct, felt to be possibly embolic in the setting of cardiomyopathy - placed on Coumadin at that time   Hyperlipidemia    Nonischemic cardiomyopathy (HCC) 03/28/2017   Obesity    Prediabetes 05/03/2017    PAST SURGICAL HISTORY: No past surgical history on file.  FAMILY HISTORY: The patient's family history includes Heart attack in his mother; Hypertension in his father and mother; Stroke in his cousin.   SOCIAL HISTORY:  The patient  reports that he has never smoked. He has never used smokeless tobacco. He reports that he does not drink alcohol and does not use drugs.  MEDICATIONS: Current Outpatient Medications  Medication  Instructions   allopurinol (ZYLOPRIM) 200 mg, Oral, Daily   amLODipine (NORVASC) 10 mg, Oral, Daily   aspirin EC (ASPIRIN LOW DOSE) 81 mg, Oral, Daily   atorvastatin (LIPITOR) 20 mg, Oral, Daily   carvedilol (COREG) 50 mg, Oral, 2 times daily with meals   Clindamycin-Benzoyl Per, Refr, gel 1  Application, Topical, 2 times daily   doxazosin (CARDURA) 2 mg, Oral, Daily at bedtime   famotidine (PEPCID) 40 mg, Oral, Daily   ofloxacin (OCUFLOX) 0.3 % ophthalmic solution Place 1 drop four times daily in the affected eye for 7 days. If symptoms develop, okay to use in both eyes.   pantoprazole (PROTONIX) 40 mg, Oral, Daily   predniSONE (DELTASONE) 40 mg, Oral, Daily with breakfast   predniSONE (DELTASONE) 40 mg, Oral, Daily with breakfast    REVIEW OF SYSTEMS: Review of Systems  Constitutional: Positive for malaise/fatigue and weight loss (80 pounds from September 2023 to April 2024, per patient).  Cardiovascular:  Positive for dyspnea on exertion, leg swelling and orthopnea. Negative for chest pain, claudication, irregular heartbeat, near-syncope, palpitations, paroxysmal nocturnal dyspnea and syncope.  Respiratory:  Positive for cough and shortness of breath.   Hematologic/Lymphatic: Negative for bleeding problem.  Musculoskeletal:  Negative for muscle cramps and myalgias.  Gastrointestinal:  Positive for nausea and vomiting.  Genitourinary:        Right-sided scrotal abscess  Neurological:  Negative for dizziness and light-headedness.    PHYSICAL EXAMINATION: PHYSICAL EXAM: Temp:  [97.8 F (36.6 C)-98.1 F (36.7 C)] 97.8 F (36.6 C) (08/27 1116) Pulse Rate:  [108-125] 119 (08/27 1015) Resp:  [18-33] 33 (08/27 1015) BP: (145-181)/(110-138) 159/121 (08/27 1015) SpO2:  [88 %-100 %] 90 % (08/27 1015) Weight:  [99.8 kg] 99.8 kg (08/27 0448)  Intake/Output:  Intake/Output Summary (Last 24 hours) at 05/17/2023 1207 Last data filed at 05/17/2023 1116 Gross per 24 hour  Intake 50 ml  Output 300 ml  Net -250 ml     Net IO Since Admission: -250 mL [05/17/23 1207]  Weights:     05/17/2023    4:48 AM 01/14/2023    1:37 PM 06/03/2022   10:09 AM  Last 3 Weights  Weight (lbs) 220 lb 250 lb 8 oz 274 lb  Weight (kg) 99.791 kg 113.626 kg 124.286 kg     Physical Exam   Constitutional: No distress. He appears chronically ill.  Appears older than stated age, hemodynamically stable.   Neck: JVD present.  Cardiovascular: Regular rhythm, S1 normal and S2 normal. Tachycardia present.  No murmurs rubs or gallops appreciated secondary to tachycardia.  Pulmonary/Chest: Effort normal and breath sounds normal. No stridor. He has no wheezes. He has no rales.  Abdominal: Soft. Bowel sounds are normal. He exhibits no distension. There is no abdominal tenderness.  Musculoskeletal:        General: Edema (Warm to touch, trace bilateral) present.     Cervical back: Neck supple.  Neurological: He is alert and oriented to person, place, and time. He has intact cranial nerves (2-12).  Skin: Skin is warm and moist.  Abscess over the right scrotal sac currently nondraining.  LAB RESULTS: Chemistry Recent Labs  Lab 05/17/23 0506  NA 137  K 4.0  CL 106  CO2 11*  GLUCOSE 100*  BUN 147*  CREATININE 21.61*  CALCIUM 7.6*  PROT 7.0  ALBUMIN 3.6  AST 21  ALT 14  ALKPHOS 56  BILITOT 0.4  GFRNONAA 2*  ANIONGAP 20*    Hematology Recent Labs  Lab 05/17/23 0506  WBC 18.0*  RBC 3.41*  HGB 10.4*  HCT 31.5*  MCV 92.4  MCH 30.5  MCHC 33.0  RDW 14.4  PLT 273   High Sensitivity Troponin:   Recent Labs  Lab 05/17/23 0506 05/17/23 0802 05/17/23 1000  TROPONINIHS 4,584* 5,699* 5,441*     Cardiac EnzymesNo results for input(s): "TROPONINI" in the last 168 hours. No results for input(s): "TROPIPOC" in the last 168 hours.  BNP Recent Labs  Lab 05/17/23 0506  BNP 925.0*    DDimer  Recent Labs  Lab 05/17/23 0802  DDIMER 2.14*    Hemoglobin A1c:  Lab Results  Component Value Date   HGBA1C 5.3 06/03/2022   MPG 105 06/03/2022   TSH  Recent Labs    06/03/22 0000  TSH 2.90   Lipid Panel  Lab Results  Component Value Date   CHOL 109 01/14/2023   HDL 21 (L) 01/14/2023   LDLCALC 68 01/14/2023   TRIG 118 01/14/2023   CHOLHDL 5.2 (H) 01/14/2023    Drugs of Abuse     Component Value Date/Time   LABOPIA POSITIVE (A) 09/27/2008 1343   COCAINSCRNUR NONE DETECTED 09/27/2008 1343   LABBENZ NONE DETECTED 09/27/2008 1343   AMPHETMU NONE DETECTED 09/27/2008 1343   THCU NONE DETECTED 09/27/2008 1343   LABBARB  09/27/2008 1343    NONE DETECTED        DRUG SCREEN FOR MEDICAL PURPOSES ONLY.  IF CONFIRMATION IS NEEDED FOR ANY PURPOSE, NOTIFY LAB WITHIN 5 DAYS.        LOWEST DETECTABLE LIMITS FOR URINE DRUG SCREEN Drug Class       Cutoff (ng/mL) Amphetamine      1000 Barbiturate      200 Benzodiazepine   200 Tricyclics       300 Opiates          300 Cocaine          300 THC              50      CARDIAC DATABASE: EKG: 05/17/2023:. 6:52 AM: Sinus tachycardia, 114 bpm, TWI in the high lateral and lateral leads consider lateral ischemia, prolonged QT. 1157: Sinus tachycardia, 110 bpm, LVH per voltage criteria, ST-T changes likely secondary to LVH.  Echocardiogram: Per EMR: LVEF 15% in 2010. LVEF 25% in 2016. LVEF 30-35% 2019  Echocardiogram pending   Scheduled Meds:  Chlorhexidine Gluconate Cloth  6 each Topical Q0600   insulin aspart  0-9 Units Subcutaneous Q4H    Continuous Infusions:  heparin 1,300 Units/hr (05/17/23 0953)   nitroGLYCERIN      PRN Meds: docusate sodium, polyethylene glycol  IMPRESSION & RECOMMENDATIONS: Zachary Lynch is a 37 y.o. African-American male whose past medical history and cardiovascular risk factors include: Uncontrolled hypertension with chronic kidney disease, hyperlipidemia, presumed nonischemic cardiomyopathy, HFrEF (echo 05/2018 LVEF 30-35%), history of right MCA stroke without residual deficits, GERD, gout, obesity.  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 Prolonged QT Hypomagnesemia Hypertensive crisis on admission. Leukocytosis with scrotal infection History of right MCA stroke in 2010 without residual deficits  Plan:  NSTEMI: No active precordial discomfort or anginal chest pain. High sensitive troponins peaked at 5699 (chronic kidney disease stage V with GFR of 2 mL/min) Subtle  TWI in the high lateral and lateral leads concerning for possible ischemia. Start IV nitro drip for blood pressure management/afterload reduction Volume management per nephrology Tentatively scheduled for left and right heart catheterization once started on dialysis and has had treatment and is able to lay flat. Sooner if he develops STEMI or has anginal pain.  Patient is agreeable with the plan of care. Patient would like to remain full code and next of kin is his sister Zachary Lynch.  Check hemoglobin A1c, fasting lipids, direct LDL, LP(a) Echo ordered.  ASA already given at [facility].  Remain on telemetry and obtain a stat EKG should chest pain return or change in character/quality.  Keep NPO at midnight and will plan for LHC in the AM.  Should symptoms of chest pain continue despite 3 sublingual nitroglycerin tablets, the patient becomes hemodynamically unstable (hypotension, congestive heart failure), or develop electrical instability (VT/VF), please notify cardiology on call for urgent evaluation.  Our service will follow along, please call with questions.    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 Volume management per primary team. Will hold off on GDMT given the degree of uremia and renal function BUN 147 and Cr 21.61 Will uptitrate prior to discharge.  Prolonged QT: Hypomagnesemia: Electrolyte replacement per  primary team.  Chronic kidney disease stage V not on hemodialysis Evaluated by nephrology. Tentatively scheduled for catheter placement by IR later today Once he is dialyzed by and volume status /  uremia / is able to lay flat and cleared by nephrology will proceed with elective left and right catheterization.  Hypertensive crisis on admission: Start IV nitro drip. May use hydralazine as needed for blood pressure management. Avoid beta-blocker therapy Blood pressure should improve with volume management.  History of right MCA stroke in 2010 without residual deficits: Reemphasized the importance of secondary prevention with focus on improving her modifiable cardiovascular risk factors such as glycemic control, lipid management, blood pressure control, weight loss.  CRITICAL CARE Performed by: Zachary Lynch   Total critical care time: 47 minutes   Critical care time was exclusive of separately billable procedures and treating other patients.   Critical care was necessary to treat or prevent imminent or life-threatening deterioration due to  NSTEMI, cardiomyopathy, acute HFrEF, and CKD stage 5 likely needing dialysis.    Critical care was time spent personally by me on the following activities: development of treatment plan with patient and/or surrogate as well as nursing, discussions with consultants, evaluation of patient's response to treatment, examination of patient, obtaining history from patient or surrogate, ordering and performing treatments and interventions, ordering and review of laboratory studies, ordering and review of radiographic studies, pulse oximetry and re-evaluation of patient's condition.  Plan of care discussed with ED physician earlier this morning, intensivist, and critical care team.  Patient's questions and concerns were addressed to his satisfaction. He voices understanding of the instructions provided during this encounter.   This note was created using a voice  recognition software as a result there may be grammatical errors inadvertently enclosed that do not reflect the nature of this encounter. Every attempt is made to correct such errors.  Delilah Shan [facility]  Pager:  [phone] Office: [phone] 05/17/2023, 12:07 PM

## 2023-05-17 NOTE — ED Provider Notes (Signed)
EMERGENCY DEPARTMENT AT [facility] Provider Note   CSN: [id number] Arrival date & time: 05/17/23  0865  History  Chief Complaint  Patient presents with   Emesis    Zachary Lynch is a 37 y.o. male.  37 year old male presents ER today with a few symptoms.  Patient is been noncompliant with medication secondary to lack of insurance or money and not supposed to kick back in till October.  Main reason he came in today as he has acute dyspnea.  Patient states that started in the last 24 to 48 hours.  He also has an associated nonproductive cough that causes posttussive emesis as well.  No fevers.  States he has a history of heart failure but once again has not been on his diuretics for who knows how long.  Some mild lower extremity edema but nothing significant.  No pain in his legs.  Secondly he saw his doctor sometime in the past and was told that his lipase was elevated might have pancreatitis he come the ER immediately.  He did not.  He states that his symptoms improved over time and then he went at least a week without any symptoms but then today started having emesis again.  States that a lot of times is posttussive but sometimes he will have emesis without coughing.  He does not drink or have gallbladder problems that he knows of.  No history of pancreatitis in the past.  No known cancer.  He does endorse a 60 pound weight loss that was unintentional over many months.  Thirdly patient has an abscess on his scrotum that it has been coming and going for a little while now.  Patient is worried he might be bacteremic because of it.  It is currently draining but sometimes will stop draining and then slowly build back up until start draining again.  He is never had it drained.   Emesis      Home Medications Prior to Admission medications   Medication Sig Start Date End Date Taking? Authorizing Provider  allopurinol (ZYLOPRIM) 100 MG tablet Take 2 tablets (200 mg total) by  mouth daily. 01/14/23   [name], NP  amLODipine (NORVASC) 10 MG tablet Take 1 tablet (10 mg total) by mouth daily. 01/14/23   [name], NP  aspirin (ASPIRIN LOW DOSE) 81 MG EC tablet Take 1 tablet (81 mg total) by mouth daily. 12/15/21   [name], NP  atorvastatin (LIPITOR) 20 MG tablet Take 1 tablet (20 mg total) by mouth daily. 12/15/21   [name], NP  carvedilol (COREG) 25 MG tablet Take 2 tablets (50 mg total) by mouth 2 (two) times daily with a meal. 01/14/23   [name], NP  Clindamycin-Benzoyl Per, Refr, gel Apply 1 Application topically 2 (two) times daily. 01/14/23   [name], NP  doxazosin (CARDURA) 2 MG tablet Take 1 tablet (2 mg total) by mouth at bedtime. 12/15/21   [name], NP  famotidine (PEPCID) 40 MG tablet Take 1 tablet (40 mg total) by mouth daily. 01/14/23   [name], NP  ofloxacin (OCUFLOX) 0.3 % ophthalmic solution Place 1 drop four times daily in the affected eye for 7 days. If symptoms develop, okay to use in both eyes. 04/13/23   [name], NP  pantoprazole (PROTONIX) 40 MG tablet Take 1 tablet (40 mg total) by mouth daily. 01/14/23   [name], NP  predniSONE (DELTASONE) 20 MG tablet Take 2 tablets (40 mg total) by  mouth daily with breakfast. 05/12/23   [name], NP  predniSONE (DELTASONE) 20 MG tablet Take 2 tablets (40 mg total) by mouth daily with breakfast for 7 days. 05/12/23 05/19/23  Viviano Simas, FNP      Allergies    Albuterol and Tramadol    Review of Systems   Review of Systems  Gastrointestinal:  Positive for vomiting.    Physical Exam Updated Vital Signs BP (!) 168/138   Pulse (!) 108   Temp 98.1 F (36.7 C) (Oral)   Resp (!) 29   Ht 5\' 11"  (1.803 m)   Wt 99.8 kg   SpO2 94%   BMI 30.68 kg/m  Physical Exam Vitals and nursing note reviewed.  Constitutional:      Appearance: He is well-developed.  HENT:     Head: Normocephalic and atraumatic.     Nose: No congestion or rhinorrhea.  Eyes:     Pupils: Pupils are equal,  round, and reactive to light.  Cardiovascular:     Rate and Rhythm: Tachycardia present.  Pulmonary:     Effort: Pulmonary effort is normal. Tachypnea present. No respiratory distress.  Abdominal:     General: There is no distension.  Genitourinary:    Comments: Approximately 2 x 2 cm abscess on his scrotum is currently draining purulent fluid with some surrounding erythema and induration. Musculoskeletal:        General: Normal range of motion.     Cervical back: Normal range of motion.  Skin:    General: Skin is warm and dry.  Neurological:     General: No focal deficit present.     Mental Status: He is alert.     ED Results / Procedures / Treatments   Labs (all labs ordered are listed, but only abnormal results are displayed) Labs Reviewed  LIPASE, BLOOD - Abnormal; Notable for the following components:      Result Value   Lipase 116 (*)    All other components within normal limits  COMPREHENSIVE METABOLIC PANEL - Abnormal; Notable for the following components:   CO2 11 (*)    Glucose, Bld 100 (*)    BUN 147 (*)    Creatinine, Ser 21.61 (*)    Calcium 7.6 (*)    GFR, Estimated 2 (*)    Anion gap 20 (*)    All other components within normal limits  CBC - Abnormal; Notable for the following components:   WBC 18.0 (*)    RBC 3.41 (*)    Hemoglobin 10.4 (*)    HCT 31.5 (*)    All other components within normal limits  URINALYSIS, ROUTINE W REFLEX MICROSCOPIC - Abnormal; Notable for the following components:   Color, Urine STRAW (*)    Glucose, UA 50 (*)    Hgb urine dipstick MODERATE (*)    Protein, ur 100 (*)    Bacteria, UA RARE (*)    All other components within normal limits  BRAIN NATRIURETIC PEPTIDE - Abnormal; Notable for the following components:   B Natriuretic Peptide 925.0 (*)    All other components within normal limits  TROPONIN I (HIGH SENSITIVITY) - Abnormal; Notable for the following components:   Troponin I (High Sensitivity) 4,584 (*)    All  other components within normal limits  PROTIME-INR  MAGNESIUM  PHOSPHORUS  BLOOD GAS, VENOUS  TROPONIN I (HIGH SENSITIVITY)    EKG None  Radiology DG Chest 2 View  Result Date: 05/17/2023 CLINICAL DATA:  Shortness of breath  and congestion. Cough and emesis. Recent diagnosis of pancreatitis. EXAM: CHEST - 2 VIEW COMPARISON:  10/18/20 FINDINGS: The heart size and mediastinal contours are within normal limits. Both lungs are clear. The visualized skeletal structures are unremarkable. IMPRESSION: No active cardiopulmonary disease. Electronically Signed   By: Signa Kell M.D.   On: 05/17/2023 06:28    Procedures .Critical Care  Performed by: Marily Memos, MD Authorized by: Marily Memos, MD   Critical care provider statement:    Critical care time (minutes):  75   Critical care was necessary to treat or prevent imminent or life-threatening deterioration of the following conditions:  Cardiac failure, renal failure and circulatory failure   Critical care was time spent personally by me on the following activities:  Development of treatment plan with patient or surrogate, discussions with consultants, evaluation of patient's response to treatment, examination of patient, ordering and review of laboratory studies, ordering and review of radiographic studies, ordering and performing treatments and interventions, pulse oximetry, re-evaluation of patient's condition and review of old charts .Marland KitchenIncision and Drainage  Date/Time: 05/17/2023 7:21 AM  Performed by: Marily Memos, MD Authorized by: Marily Memos, MD   Consent:    Consent obtained:  Verbal   Risks discussed:  Bleeding, incomplete drainage, pain, infection and damage to other organs   Alternatives discussed:  Delayed treatment Universal protocol:    Procedure explained and questions answered to patient or proxy's satisfaction: yes     Relevant documents present and verified: yes     Test results available : yes     Imaging  studies available: no     Patient identity confirmed:  Verbally with patient and arm band Location:    Type:  Abscess   Size:  2x2   Location:  Anogenital   Anogenital location:  Scrotal space Pre-procedure details:    Skin preparation:  Povidone-iodine Sedation:    Sedation type:  None Anesthesia:    Anesthesia method:  Local infiltration   Local anesthetic:  Lidocaine 1% w/o epi Procedure type:    Complexity:  Simple Comments:     Patient with worsening dyspnea, cough and emesis when lying at all flat so procedure terminated after lidocaine. Is draining purulent material from area of lidocaine injection and another preexisting pin-sized hole, will defer to inpatient team/surgery when patient is more stable and able to tolerate the procedure for drainage.    Medications Ordered in ED Medications  famotidine (PEPCID) IVPB 20 mg premix (20 mg Intravenous New Bag/Given 05/17/23 0751)  oxyCODONE-acetaminophen (PERCOCET/ROXICET) 5-325 MG per tablet 2 tablet (2 tablets Oral Given 05/17/23 0637)  cefTRIAXone (ROCEPHIN) 2 g in sodium chloride 0.9 % 100 mL IVPB (0 g Intravenous Stopped 05/17/23 0724)  lidocaine HCl (PF) (XYLOCAINE) 2 % injection 10 mL (10 mLs Infiltration Given by Other 05/17/23 4332)  LORazepam (ATIVAN) 2 MG/ML injection (1 mg Intravenous Given 05/17/23 9518)    ED Course/ Medical Decision Making/ A&P Clinical Course as of 05/17/23 0758  Tue May 17, 2023  0730 Assumed care from Dr Clayborne Dana. Has hx of CHF and ESRD not on iHD but still makes some urine. Hasn't followed up in several years with nephro. Yesterday had vomiting and shortness of breath. Has an abscess on his scrotum. Spo2 is normal but is tachypneic with clear lung sounds. Has draining abscess on his scrotum but no concern for fourniers. Has prolonged QT. Troponin is 4,500. Going to talk to cardiology. Bicarb is low. Can't lay flax for scrotal abscess drainage. Will  continue to watch and possibly have hospitalist talk to  surgery or urology. Nephrology will come by to see the patient. Still awaiting cardiology consult.  [RP]    Clinical Course User Index [RP] Rondel Baton, MD                                 Medical Decision Making Amount and/or Complexity of Data Reviewed Labs: ordered. Radiology: ordered. ECG/medicine tests: ordered.  Risk Prescription drug management. Decision regarding hospitalization.   Will need to drain abscess on his scrotum.  Will start antibiotics.  Suspect possible pulmonary edema as a cause for his coughing and shortness of breath he is not hypoxic or any distress right now we will wait for x-ray and labs before treatment.  Also consider possible PE however is not really hypoxic.  No fever or productive cough to suggest pneumonia. Patient did have a lipase over 300 at his doctor's office previously.  With the weight loss I do worry about cancer since he does not have a lot of risk factors for pancreatitis otherwise.  Could be biliary as well of course.  Will evaluate initially with a lipase.  Will hold on any fluids significant stuff for now until I make sure he is not in heart failure.  Will likely need imaging at some point but that will depend on what his labs look like and what other extra evaluations might need to be done.  Lipase is elevated but not to the level of pancreatitis.  I suspect that his emesis is probably coming from either uremia or indigestion or posttussive cough which also could be related to uremia I would think.  His chest x-ray is clear and his lungs are clear however his BNP is around the thousand he has cough and dyspnea but no hypoxia.  His troponin is above 4000.  His EKG shows some lateral depressions but no elevations anywhere and does not show evidence of STEMI.  Of course his troponin could be somewhat elevated related to retention but he does still make urine and I would expect this level.  No chest pain but does have shortness of breath.   Wondering if maybe he has some type of pericardial effusion however he is not hypotensive and does not have JVD necessarily.  Will consult cardiology for further recommendations I suspect he needs an echo but will hold on heparin for the time being especially if there is a possibility of start dialysis.  Secondary to level of uremia we will hold on his aspirin until talking to cardiology.  Attempted to drain his abscess.  However anytime the patient lays flat his coughing gets a lot worse and he starts throwing up.  It is already draining some purulent fluid.  Inserted antibiotics.  After discussion with him we will hold off on trying to I&D until he is little bit more stable.  His QT is prolonged and his EKG so was not able to use any traditional nausea medicines.  Alcohol swabs did not work so I ordered Ativan.  Discussed with Dr. Thedore Mins with nephrology.  They will see the patient and determine best course of action as far as kidneys go.  Pending callback from cardiology.  Attempted to drain abscess however the Patient had worsening dyspnea, cough and emesis when lying at all flat so procedure terminated after lidocaine for patient safety concern. Is draining purulent material from area of lidocaine injection  and another preexisting pin-sized hole, will defer to inpatient team/surgery/urology when patient is more stable and able to tolerate the procedure for drainage. Echo ordered, mg, phos, vbg, pt inr added on to labs.   Care transferred to Dr. Jarold Motto pending cardiology callback, nephro consult, fu labs and ultimate level of care disposition and appropriate hospital.  Final Clinical Impression(s) / ED Diagnoses Final diagnoses:  Acute renal failure superimposed on stage 5 chronic kidney disease, not on chronic dialysis, unspecified acute renal failure type (HCC)  Elevated troponin  Elevated brain natriuretic peptide (BNP) level  Acidosis  Scrotal abscess  Hypertension, unspecified type     Rx / DC Orders ED Discharge Orders     None         Dione Mccombie, Barbara Cower, MD 05/17/23 [phone]

## 2023-05-18 ENCOUNTER — Inpatient Hospital Stay (HOSPITAL_COMMUNITY): Payer: Medicaid Other

## 2023-05-18 ENCOUNTER — Encounter (HOSPITAL_COMMUNITY)
Admission: EM | Disposition: A | Discharge status: Home / Self Care | Payer: Self-pay | Source: Home / Self Care | Attending: Internal Medicine

## 2023-05-18 DIAGNOSIS — I5041 Acute combined systolic (congestive) and diastolic (congestive) heart failure: Secondary | ICD-10-CM | POA: Diagnosis not present

## 2023-05-18 DIAGNOSIS — N492 Inflammatory disorders of scrotum: Secondary | ICD-10-CM | POA: Diagnosis not present

## 2023-05-18 DIAGNOSIS — N186 End stage renal disease: Secondary | ICD-10-CM

## 2023-05-18 DIAGNOSIS — I5020 Unspecified systolic (congestive) heart failure: Secondary | ICD-10-CM

## 2023-05-18 DIAGNOSIS — N185 Chronic kidney disease, stage 5: Secondary | ICD-10-CM | POA: Diagnosis not present

## 2023-05-18 DIAGNOSIS — N179 Acute kidney failure, unspecified: Secondary | ICD-10-CM

## 2023-05-18 HISTORY — PX: RIGHT/LEFT HEART CATH AND CORONARY ANGIOGRAPHY: CATH118266

## 2023-05-18 LAB — POCT I-STAT EG7
Acid-base deficit: 6 mmol/L — ABNORMAL HIGH (ref 0.0–2.0)
Acid-base deficit: 7 mmol/L — ABNORMAL HIGH (ref 0.0–2.0)
Bicarbonate: 18 mmol/L — ABNORMAL LOW (ref 20.0–28.0)
Bicarbonate: 18.8 mmol/L — ABNORMAL LOW (ref 20.0–28.0)
Calcium, Ion: 1 mmol/L — ABNORMAL LOW (ref 1.15–1.40)
Calcium, Ion: 1.04 mmol/L — ABNORMAL LOW (ref 1.15–1.40)
HCT: 25 % — ABNORMAL LOW (ref 39.0–52.0)
HCT: 26 % — ABNORMAL LOW (ref 39.0–52.0)
Hemoglobin: 8.5 g/dL — ABNORMAL LOW (ref 13.0–17.0)
Hemoglobin: 8.8 g/dL — ABNORMAL LOW (ref 13.0–17.0)
O2 Saturation: 70 %
O2 Saturation: 70 %
Potassium: 3.7 mmol/L (ref 3.5–5.1)
Potassium: 3.8 mmol/L (ref 3.5–5.1)
Sodium: 137 mmol/L (ref 135–145)
Sodium: 138 mmol/L (ref 135–145)
TCO2: 19 mmol/L — ABNORMAL LOW (ref 22–32)
TCO2: 20 mmol/L — ABNORMAL LOW (ref 22–32)
pCO2, Ven: 31.5 mmHg — ABNORMAL LOW (ref 44–60)
pCO2, Ven: 32.7 mmHg — ABNORMAL LOW (ref 44–60)
pH, Ven: 7.365 (ref 7.25–7.43)
pH, Ven: 7.367 (ref 7.25–7.43)
pO2, Ven: 37 mmHg (ref 32–45)
pO2, Ven: 37 mmHg (ref 32–45)

## 2023-05-18 LAB — POCT I-STAT 7, (LYTES, BLD GAS, ICA,H+H)
Acid-base deficit: 7 mmol/L — ABNORMAL HIGH (ref 0.0–2.0)
Bicarbonate: 17.1 mmol/L — ABNORMAL LOW (ref 20.0–28.0)
Calcium, Ion: 0.97 mmol/L — ABNORMAL LOW (ref 1.15–1.40)
HCT: 25 % — ABNORMAL LOW (ref 39.0–52.0)
Hemoglobin: 8.5 g/dL — ABNORMAL LOW (ref 13.0–17.0)
O2 Saturation: 97 %
Potassium: 3.7 mmol/L (ref 3.5–5.1)
Sodium: 138 mmol/L (ref 135–145)
TCO2: 18 mmol/L — ABNORMAL LOW (ref 22–32)
pCO2 arterial: 29.1 mmHg — ABNORMAL LOW (ref 32–48)
pH, Arterial: 7.379 (ref 7.35–7.45)
pO2, Arterial: 90 mmHg (ref 83–108)

## 2023-05-18 LAB — CBC
HCT: 24.8 % — ABNORMAL LOW (ref 39.0–52.0)
Hemoglobin: 8.4 g/dL — ABNORMAL LOW (ref 13.0–17.0)
MCH: 29.8 pg (ref 26.0–34.0)
MCHC: 33.9 g/dL (ref 30.0–36.0)
MCV: 87.9 fL (ref 80.0–100.0)
Platelets: 239 10*3/uL (ref 150–400)
RBC: 2.82 MIL/uL — ABNORMAL LOW (ref 4.22–5.81)
RDW: 14.3 % (ref 11.5–15.5)
WBC: 14.3 10*3/uL — ABNORMAL HIGH (ref 4.0–10.5)
nRBC: 0.1 % (ref 0.0–0.2)

## 2023-05-18 LAB — RENAL FUNCTION PANEL
Albumin: 2.8 g/dL — ABNORMAL LOW (ref 3.5–5.0)
Albumin: 2.7 g/dL — ABNORMAL LOW (ref 3.5–5.0)
Anion gap: 19 — ABNORMAL HIGH (ref 5–15)
Anion gap: 17 — ABNORMAL HIGH (ref 5–15)
BUN: 113 mg/dL — ABNORMAL HIGH (ref 6–20)
BUN: 115 mg/dL — ABNORMAL HIGH (ref 6–20)
CO2: 17 mmol/L — ABNORMAL LOW (ref 22–32)
CO2: 17 mmol/L — ABNORMAL LOW (ref 22–32)
Calcium: 7.8 mg/dL — ABNORMAL LOW (ref 8.9–10.3)
Calcium: 7.9 mg/dL — ABNORMAL LOW (ref 8.9–10.3)
Chloride: 101 mmol/L (ref 98–111)
Chloride: 102 mmol/L (ref 98–111)
Creatinine, Ser: 16.96 mg/dL — ABNORMAL HIGH (ref 0.61–1.24)
Creatinine, Ser: 17.42 mg/dL — ABNORMAL HIGH (ref 0.61–1.24)
GFR, Estimated: 3 mL/min — ABNORMAL LOW (ref >60)
GFR, Estimated: 3 mL/min — ABNORMAL LOW (ref >60)
Glucose, Bld: 85 mg/dL (ref 70–99)
Glucose, Bld: 115 mg/dL — ABNORMAL HIGH (ref 70–99)
Phosphorus: 7 mg/dL — ABNORMAL HIGH (ref 2.5–4.6)
Phosphorus: 7.7 mg/dL — ABNORMAL HIGH (ref 2.5–4.6)
Potassium: 3.7 mmol/L (ref 3.5–5.1)
Potassium: 3.8 mmol/L (ref 3.5–5.1)
Sodium: 137 mmol/L (ref 135–145)
Sodium: 136 mmol/L (ref 135–145)

## 2023-05-18 LAB — HEPARIN LEVEL (UNFRACTIONATED): Heparin Unfractionated: 0.25 [IU]/mL — ABNORMAL LOW (ref 0.30–0.70)

## 2023-05-18 LAB — LDL CHOLESTEROL, DIRECT: Direct LDL: 60 mg/dL (ref 0–99)

## 2023-05-18 LAB — MAGNESIUM: Magnesium: 1.8 mg/dL (ref 1.7–2.4)

## 2023-05-18 LAB — LIPID PANEL
Cholesterol: 102 mg/dL (ref 0–200)
HDL: 26 mg/dL — ABNORMAL LOW (ref >40)
LDL Cholesterol: 62 mg/dL (ref 0–99)
Total CHOL/HDL Ratio: 3.9 ratio
Triglycerides: 70 mg/dL (ref <150)
VLDL: 14 mg/dL (ref 0–40)

## 2023-05-18 LAB — HEPATITIS B SURFACE ANTIBODY, QUANTITATIVE: Hep B S AB Quant (Post): 55.2 m[IU]/mL

## 2023-05-18 LAB — GLUCOSE, CAPILLARY
Glucose-Capillary: 86 mg/dL (ref 70–99)
Glucose-Capillary: 82 mg/dL (ref 70–99)
Glucose-Capillary: 78 mg/dL (ref 70–99)
Glucose-Capillary: 117 mg/dL — ABNORMAL HIGH (ref 70–99)
Glucose-Capillary: 91 mg/dL (ref 70–99)

## 2023-05-18 LAB — PARATHYROID HORMONE, INTACT (NO CA): PTH: 1290 pg/mL — ABNORMAL HIGH (ref 15–65)

## 2023-05-18 LAB — POCT ACTIVATED CLOTTING TIME: Activated Clotting Time: 159 seconds

## 2023-05-18 LAB — PHOSPHORUS: Phosphorus: 7.1 mg/dL — ABNORMAL HIGH (ref 2.5–4.6)

## 2023-05-18 SURGERY — RIGHT/LEFT HEART CATH AND CORONARY ANGIOGRAPHY
Anesthesia: LOCAL

## 2023-05-18 MED ORDER — LABETALOL HCL 5 MG/ML IV SOLN: 10.0000 mg | INTRAVENOUS | Status: AC | PRN

## 2023-05-18 MED ORDER — HEPARIN SODIUM (PORCINE) 1000 UNIT/ML DIALYSIS
1000.0000 [IU] | INTRAMUSCULAR | Status: DC | PRN
  Administered 2023-05-20: 3800 [IU]
  Filled 2023-05-18: qty 1

## 2023-05-18 MED ORDER — ANTICOAGULANT SODIUM CITRATE 4% (200MG/5ML) IV SOLN: 5.0000 mL | Status: DC | PRN

## 2023-05-18 MED ORDER — SODIUM CHLORIDE 0.9 % IV SOLN: INTRAVENOUS | Status: DC

## 2023-05-18 MED ORDER — DOXAZOSIN MESYLATE 2 MG PO TABS
2.0000 mg | ORAL_TABLET | Freq: Every day | ORAL | Status: DC
  Filled 2023-05-18: qty 1

## 2023-05-18 MED ORDER — MAGNESIUM SULFATE 2 GM/50ML IV SOLN
2.0000 g | Freq: Once | INTRAVENOUS | Status: AC
  Administered 2023-05-18: 2 g via INTRAVENOUS
  Filled 2023-05-18: qty 50

## 2023-05-18 MED ORDER — FENTANYL CITRATE (PF) 100 MCG/2ML IJ SOLN
INTRAMUSCULAR | Status: DC | PRN
  Administered 2023-05-18: 25 ug via INTRAVENOUS

## 2023-05-18 MED ORDER — LIDOCAINE HCL (PF) 1 % IJ SOLN
INTRAMUSCULAR | Status: DC | PRN
  Administered 2023-05-18: 5 mL via INTRADERMAL

## 2023-05-18 MED ORDER — RENA-VITE PO TABS
1.0000 | ORAL_TABLET | Freq: Every day | ORAL | Status: DC
  Administered 2023-05-18 – 2023-05-20 (×3): 1 via ORAL
  Filled 2023-05-18 (×3): qty 1

## 2023-05-18 MED ORDER — FENTANYL CITRATE (PF) 100 MCG/2ML IJ SOLN
INTRAMUSCULAR | Status: AC
  Filled 2023-05-18: qty 2

## 2023-05-18 MED ORDER — LIDOCAINE-PRILOCAINE 2.5-2.5 % EX CREA: 1.0000 | TOPICAL_CREAM | CUTANEOUS | Status: DC | PRN

## 2023-05-18 MED ORDER — ALTEPLASE 2 MG IJ SOLR: 2.0000 mg | Freq: Once | INTRAMUSCULAR | Status: DC | PRN

## 2023-05-18 MED ORDER — VITAMIN C 500 MG PO TABS
250.0000 mg | ORAL_TABLET | Freq: Two times a day (BID) | ORAL | Status: DC
  Administered 2023-05-18 – 2023-05-21 (×7): 250 mg via ORAL
  Filled 2023-05-18 (×7): qty 1

## 2023-05-18 MED ORDER — HYDRALAZINE HCL 50 MG PO TABS
50.0000 mg | ORAL_TABLET | Freq: Three times a day (TID) | ORAL | Status: DC
  Administered 2023-05-18 – 2023-05-19 (×5): 50 mg via ORAL
  Filled 2023-05-18 (×5): qty 1

## 2023-05-18 MED ORDER — HEPARIN SODIUM (PORCINE) 1000 UNIT/ML IJ SOLN
INTRAMUSCULAR | Status: AC
  Filled 2023-05-18: qty 4

## 2023-05-18 MED ORDER — HEPARIN SODIUM (PORCINE) 1000 UNIT/ML IJ SOLN
4000.0000 [IU] | Freq: Once | INTRAMUSCULAR | Status: AC
  Administered 2023-05-19: 4000 [IU]

## 2023-05-18 MED ORDER — LIDOCAINE HCL (PF) 1 % IJ SOLN: 5.0000 mL | INTRAMUSCULAR | Status: DC | PRN

## 2023-05-18 MED ORDER — ONDANSETRON HCL 4 MG/2ML IJ SOLN
4.0000 mg | Freq: Four times a day (QID) | INTRAMUSCULAR | Status: DC | PRN
  Administered 2023-05-19: 4 mg via INTRAVENOUS
  Filled 2023-05-18 (×3): qty 2

## 2023-05-18 MED ORDER — IOHEXOL 350 MG/ML SOLN
INTRAVENOUS | Status: DC | PRN
  Administered 2023-05-18: 34 mL

## 2023-05-18 MED ORDER — HEPARIN SODIUM (PORCINE) 1000 UNIT/ML DIALYSIS: 20.0000 [IU]/kg | INTRAMUSCULAR | Status: DC | PRN

## 2023-05-18 MED ORDER — CARVEDILOL 25 MG PO TABS: 50.0000 mg | ORAL_TABLET | Freq: Two times a day (BID) | ORAL | Status: DC

## 2023-05-18 MED ORDER — THIAMINE MONONITRATE 100 MG PO TABS
100.0000 mg | ORAL_TABLET | Freq: Every day | ORAL | Status: DC
  Administered 2023-05-18 – 2023-05-21 (×4): 100 mg via ORAL
  Filled 2023-05-18 (×4): qty 1

## 2023-05-18 MED ORDER — ISOSORBIDE MONONITRATE ER 60 MG PO TB24
60.0000 mg | ORAL_TABLET | Freq: Every day | ORAL | Status: DC
  Administered 2023-05-18 – 2023-05-19 (×2): 60 mg via ORAL
  Filled 2023-05-18 (×2): qty 2

## 2023-05-18 MED ORDER — LIDOCAINE HCL (PF) 1 % IJ SOLN
INTRAMUSCULAR | Status: AC
  Filled 2023-05-18: qty 30

## 2023-05-18 MED ORDER — MIDAZOLAM HCL 2 MG/2ML IJ SOLN
INTRAMUSCULAR | Status: DC | PRN
  Administered 2023-05-18: 1 mg via INTRAVENOUS

## 2023-05-18 MED ORDER — CHLORHEXIDINE GLUCONATE CLOTH 2 % EX PADS
6.0000 | MEDICATED_PAD | Freq: Every day | CUTANEOUS | Status: DC
  Administered 2023-05-18: 6 via TOPICAL

## 2023-05-18 MED ORDER — HEPARIN (PORCINE) IN NACL 1000-0.9 UT/500ML-% IV SOLN
INTRAVENOUS | Status: DC | PRN
  Administered 2023-05-18 (×2): 500 mL

## 2023-05-18 MED ORDER — SODIUM CHLORIDE 0.9 % IV SOLN
250.0000 mg | Freq: Every day | INTRAVENOUS | Status: AC
  Administered 2023-05-18 – 2023-05-19 (×2): 250 mg via INTRAVENOUS
  Filled 2023-05-18 (×2): qty 20

## 2023-05-18 MED ORDER — PENTAFLUOROPROP-TETRAFLUOROETH EX AERO: 1.0000 | INHALATION_SPRAY | CUTANEOUS | Status: DC | PRN

## 2023-05-18 MED ORDER — AMLODIPINE BESYLATE 10 MG PO TABS: 10.0000 mg | ORAL_TABLET | Freq: Every day | ORAL | Status: DC

## 2023-05-18 MED ORDER — ASPIRIN 81 MG PO CHEW
81.0000 mg | CHEWABLE_TABLET | ORAL | Status: AC
  Administered 2023-05-18: 81 mg via ORAL
  Filled 2023-05-18: qty 1

## 2023-05-18 MED ORDER — SODIUM CHLORIDE 0.9 % IV SOLN: 250.0000 mL | INTRAVENOUS | Status: DC | PRN

## 2023-05-18 MED ORDER — FERRIC CITRATE 1 GM 210 MG(FE) PO TABS
420.0000 mg | ORAL_TABLET | Freq: Three times a day (TID) | ORAL | Status: DC
  Administered 2023-05-18 – 2023-05-21 (×8): 420 mg via ORAL
  Filled 2023-05-18 (×12): qty 2

## 2023-05-18 MED ORDER — HYDRALAZINE HCL 20 MG/ML IJ SOLN: 10.0000 mg | INTRAMUSCULAR | Status: AC | PRN

## 2023-05-18 MED ORDER — ACETAMINOPHEN 325 MG PO TABS: 650.0000 mg | ORAL_TABLET | ORAL | Status: DC | PRN

## 2023-05-18 MED ORDER — HEPARIN SODIUM (PORCINE) 1000 UNIT/ML DIALYSIS: 1000.0000 [IU] | INTRAMUSCULAR | Status: DC | PRN

## 2023-05-18 MED ORDER — HEPARIN SODIUM (PORCINE) 5000 UNIT/ML IJ SOLN
5000.0000 [IU] | Freq: Three times a day (TID) | INTRAMUSCULAR | Status: DC
  Administered 2023-05-18 – 2023-05-21 (×7): 5000 [IU] via SUBCUTANEOUS
  Filled 2023-05-18 (×7): qty 1

## 2023-05-18 MED ORDER — MIDAZOLAM HCL 2 MG/2ML IJ SOLN
INTRAMUSCULAR | Status: AC
  Filled 2023-05-18: qty 2

## 2023-05-18 MED ORDER — SODIUM CHLORIDE 0.9% FLUSH
3.0000 mL | Freq: Two times a day (BID) | INTRAVENOUS | Status: DC
  Administered 2023-05-18 – 2023-05-20 (×5): 3 mL via INTRAVENOUS

## 2023-05-18 MED ORDER — SODIUM CHLORIDE 0.9% FLUSH: 3.0000 mL | INTRAVENOUS | Status: DC | PRN

## 2023-05-18 SURGICAL SUPPLY — 12 items
CATH INFINITI 5FR MULTPACK ANG (CATHETERS) IMPLANT
CATH SWAN GANZ 7F STRAIGHT (CATHETERS) IMPLANT
KIT MICROPUNCTURE NIT STIFF (SHEATH) IMPLANT
KIT SYRINGE INJ CVI SPIKEX1 (MISCELLANEOUS) IMPLANT
MAT PREVALON FULL STRYKER (MISCELLANEOUS) IMPLANT
PACK CARDIAC CATHETERIZATION (CUSTOM PROCEDURE TRAY) ×2 IMPLANT
SET ATX-X65L (MISCELLANEOUS) IMPLANT
SHEATH PINNACLE 5F 10CM (SHEATH) IMPLANT
SHEATH PINNACLE 7F 10CM (SHEATH) IMPLANT
SHEATH PROBE COVER 6X72 (BAG) IMPLANT
WIRE EMERALD 3MM-J .025X260CM (WIRE) IMPLANT
WIRE EMERALD 3MM-J .035X150CM (WIRE) IMPLANT

## 2023-05-18 NOTE — Progress Notes (Addendum)
[facility] NEPHROLOGY PROGRESS NOTE  Assessment/ Plan: Pt is a 37 y.o. 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 Status post tunneled HD catheter placed by IR on 8/27 and received first HD and tolerated well. Plan for second dialysis today after cardiac cath. Renal navigator is already aware of arranging outpatient dialysis. Seen by vascular surgeon for permanent access, not stable for surgery at this time.  # NSTEMI: Seen by cardiologist and now going to cath.  Continue current cardiac medications.  # Acute on chronic systolic CHF/cardiorenal syndrome: EF of 25%.  Cath today.  Volume management with dialysis.  # Scrotal abscess: Seen by surgeon, no surgical intervention, getting Zosyn.  # Anemia of CKD: Hemoglobin low, iron saturation 35 with low ferritin level.  I will order IV iron.  # HTN/volume: Going to cath today, volume management with UF/dialysis.  Continue cardiac medication.  # CKD-MBD, hyperphosphatemia: Start Auryxia with meals.  Check PTH level.  I have discussed with ICU team and with family member who was presented with him at the bedside.  Subjective: Seen and examined at bedside.  Patient tolerated dialysis well yesterday.  Plan for cardiac cath.  No other major health event. Objective Vital signs in last 24 hours: Vitals:   05/18/23 0645 05/18/23 0700 05/18/23 0800 05/18/23 0840  BP: 125/85 (!) 140/92 (!) 135/96   Pulse: (!) 109 (!) 107 (!) 106   Resp: 18 20 14    Temp:   98.5 F (36.9 C)   TempSrc:      SpO2: 99% 99% 100% 97%  Weight:      Height:       Weight change: -0.791 kg  Intake/Output Summary (Last 24 hours) at 05/18/2023 0927 Last data filed at 05/18/2023 0800 Gross per 24 hour  Intake 1373.49 ml  Output 1850 ml  Net -476.51 ml        Labs: RENAL PANEL Recent Labs  Lab 05/17/23 0506 05/17/23 0802 05/17/23 1201 05/18/23 0632  NA 137  --  138 137  K 4.0  --  4.3 3.7  CL 106  --  107 101  CO2 11*  --  12* 17*  GLUCOSE 100*  --  86 85  BUN 147*  --  150* 113*  CREATININE 21.61*  --  21.88* 16.96*  CALCIUM 7.6*  --  7.6* 7.8*  MG  --  1.6* 2.0 1.8  PHOS  --  10.5* 10.2* 7.1*  7.0*  ALBUMIN 3.6  --  3.3* 2.8*    Liver Function Tests: Recent Labs  Lab 05/17/23 0506 05/17/23 1201 05/18/23 0632  AST 21 23  --   ALT 14 15  --   ALKPHOS 56 57  --   BILITOT 0.4 0.4  --   PROT 7.0 7.0  --   ALBUMIN 3.6 3.3* 2.8*   Recent Labs  Lab 05/17/23 0506  LIPASE 116*   No results for input(s): "AMMONIA" in the last 168 hours. CBC: Recent Labs    06/03/22 0000 01/14/23 1419 05/17/23 0506 05/17/23 0802 05/17/23 1201 05/18/23 0632  HGB 11.8* 10.1* 10.4*  --  10.7* 8.4*  MCV 91.7 94.4 92.4  --  94.2 87.9  FERRITIN  --   --   --  47  --   --   TIBC  --   --   --  156*  --   --   IRON  --   --   --  55  --   --     Cardiac Enzymes: No results for input(s): "CKTOTAL", "CKMB", "CKMBINDEX", "TROPONINI" in the last 168 hours. CBG: Recent Labs  Lab 05/17/23 1619 05/17/23 1942 05/17/23 2342 05/18/23 0403 05/18/23 0815  GLUCAP 78 89 86 86 82    Iron Studies:  Recent Labs    05/17/23 0802  IRON 55  TIBC 156*  FERRITIN 47   Studies/Results: DG Chest Port 1 View  Result Date: 05/18/2023 CLINICAL DATA:  Pulmonary edema. EXAM: PORTABLE CHEST 1 VIEW COMPARISON:  May 17, 2023. FINDINGS: Stable cardiomediastinal silhouette. Right internal jugular catheter is noted with tip in expected position of cavoatrial junction. Both lungs are clear. Subacute to old left clavicular fracture is noted. IMPRESSION: No active disease. Electronically Signed   By: Lupita Raider M.D.   On: 05/18/2023 07:50   ECHOCARDIOGRAM COMPLETE  Result Date: 05/17/2023    ECHOCARDIOGRAM REPORT   Patient Name:   Zachary Lynch  [facility] Date of Exam: 05/17/2023 Medical Rec #:  [id number]    Height:       71.0 in Accession #:    [id number]   Weight:       220.0 lb Date of Birth:  04-Nov-1985     BSA:          2.196 m Patient Age:    37 years     BP:           122/104 mmHg Patient Gender: M            HR:           120 bpm. Exam Location:  Inpatient Procedure: 2D Echo, Cardiac Doppler, Color Doppler and Intracardiac            Opacification Agent Indications:    CHF  History:        Patient has prior history of Echocardiogram examinations, most                 recent 06/06/2018. Cardiomyopathy, Stroke; Risk Factors:Obesity,                 Dyslipidemia and Hypertension. CKD.  Sonographer:    [name] Referring Phys: [id number] JASON MESNER IMPRESSIONS  1. Left ventricular ejection fraction, by estimation, is 25%. The left ventricle has severely decreased function. The left ventricle demonstrates regional wall motion abnormalities (see scoring diagram/findings for description). The left ventricular internal cavity size was mildly dilated. There is moderate concentric left ventricular hypertrophy. Left ventricular diastolic parameters are indeterminate. Largely hypokinetic save for the apex.  2. Right ventricular systolic function is hyperdynamic. The right ventricular size is normal.  3. Left atrial size was severely dilated.  4. The mitral valve is grossly normal. Mild mitral valve regurgitation. No evidence of mitral stenosis.  5. The aortic valve was not well visualized. There is mild thickening of the aortic valve. Aortic valve regurgitation is not visualized.  6. The inferior vena cava is normal in size with greater than 50% respiratory variability, suggesting right atrial pressure of 3 mmHg. Comparison(s): Prior images reviewed side by side. Compared to 2019 report, LVEF is similar. Elevated heart rates and narrow pulse pressure were not reported at prior reports. Conclusion(s)/Recommendation(s): Will reach out to primary team. FINDINGS   Left Ventricle: Left ventricular ejection fraction, by estimation, is 25%. The left ventricle has severely decreased function. The left ventricle demonstrates  regional wall motion abnormalities. Definity contrast agent was given IV to delineate the left  ventricular endocardial borders. The left ventricular internal cavity size was mildly dilated. There is moderate concentric left ventricular hypertrophy. Left ventricular diastolic parameters are indeterminate.  LV Wall Scoring: The anterior wall, antero-lateral wall, anterior septum, inferior wall, posterior wall, mid inferoseptal segment, and basal inferoseptal segment are hypokinetic. Largely hypokinetic save for the apex. Right Ventricle: The right ventricular size is normal. No increase in right ventricular wall thickness. Right ventricular systolic function is hyperdynamic. Left Atrium: Left atrial size was severely dilated. Right Atrium: Right atrial size was normal in size. Pericardium: Trivial pericardial effusion is present. The pericardial effusion is circumferential. Mitral Valve: The mitral valve is grossly normal. Mild mitral valve regurgitation. No evidence of mitral valve stenosis. Tricuspid Valve: The tricuspid valve is normal in structure. Tricuspid valve regurgitation is not demonstrated. No evidence of tricuspid stenosis. Aortic Valve: The aortic valve was not well visualized. There is mild thickening of the aortic valve. Aortic valve regurgitation is not visualized. Pulmonic Valve: The pulmonic valve was normal in structure. Pulmonic valve regurgitation is trivial. No evidence of pulmonic stenosis. Aorta: The aortic root, ascending aorta and aortic arch are all structurally normal, with no evidence of dilitation or obstruction. Venous: The inferior vena cava is normal in size with greater than 50% respiratory variability, suggesting right atrial pressure of 3 mmHg. IAS/Shunts: The interatrial septum was not well visualized.  LEFT VENTRICLE PLAX  2D LVIDd:         6.10 cm   Diastology LVIDs:         5.25 cm   LV e' medial:  10.09 cm/s LV PW:         1.40 cm   LV e' lateral: 8.16 cm/s LV IVS:        1.30 cm LVOT diam:     2.20 cm LVOT Area:     3.80 cm  RIGHT VENTRICLE             IVC RV S prime:     12.30 cm/s  IVC diam: 1.70 cm LEFT ATRIUM              Index        RIGHT ATRIUM           Index LA diam:        4.80 cm  2.19 cm/m   RA Area:     14.60 cm LA Vol (A2C):   124.0 ml 56.47 ml/m  RA Volume:   34.50 ml  15.71 ml/m LA Vol (A4C):   113.0 ml 51.46 ml/m LA Biplane Vol: 123.0 ml 56.02 ml/m   AORTA Ao Root diam: 3.40 cm Ao Asc diam:  3.40 cm MR Peak grad: 55.1 mmHg MR Vmax:      371.00 cm/s SHUNTS                           Systemic Diam: 2.20 cm Riley Lam MD Electronically signed by Riley Lam MD Signature Date/Time: 05/17/2023/4:22:43 PM    Final    CT PELVIS W CONTRAST  Result Date: 05/17/2023 CLINICAL DATA:  Scrotal swelling or edema. Scrotal abscess. Evaluate for Fournier's gangrene. EXAM: CT PELVIS WITH CONTRAST TECHNIQUE: Multidetector CT imaging of the pelvis was performed using the standard protocol following the bolus administration of intravenous contrast. RADIATION DOSE REDUCTION: This exam was performed according to the departmental dose-optimization program which includes automated  exposure control, adjustment of the mA and/or kV according to patient size and/or use of iterative reconstruction technique. CONTRAST:  75mL OMNIPAQUE IOHEXOL 350 MG/ML SOLN COMPARISON:  Abdominopelvic CT 07/19/2016. FINDINGS: Urinary Tract: The visualized distal ureters and bladder appear unremarkable. Bowel: No bowel wall thickening, distention or surrounding inflammation identified within the pelvis. No enteric contrast administered. Vascular/Lymphatic: No enlarged pelvic lymph nodes identified. There are small external iliac and inguinal lymph nodes bilaterally, similar to previous study, likely reactive. Minimal iliac  atherosclerosis without evidence of aneurysm. Reproductive: The prostate gland and seminal vesicles appear unremarkable. The visualized penis appears unremarkable. The scrotum is incompletely visualized by this examination. Other: No focal pelvic inflammatory changes or soft tissue emphysema identified. As above, the scrotum and perineum are incompletely visualized. Musculoskeletal: No acute osseous findings. No evidence of femoral head osteonecrosis or acute fracture. Suspected chronic sacroiliitis bilaterally. IMPRESSION: 1. No focal pelvic inflammatory changes or soft tissue emphysema identified. 2. The scrotum and perineum are incompletely visualized by this examination. Consider scrotal ultrasound for further evaluation. Consider urology consult. 3. Suspected chronic sacroiliitis bilaterally. Electronically Signed   By: Carey Bullocks M.D.   On: 05/17/2023 15:51   DG Chest 2 View  Result Date: 05/17/2023 CLINICAL DATA:  Shortness of breath and congestion. Cough and emesis. Recent diagnosis of pancreatitis. EXAM: CHEST - 2 VIEW COMPARISON:  10/18/20 FINDINGS: The heart size and mediastinal contours are within normal limits. Both lungs are clear. The visualized skeletal structures are unremarkable. IMPRESSION: No active cardiopulmonary disease. Electronically Signed   By: Signa Kell M.D.   On: 05/17/2023 06:28    Medications: Infusions:  [MAR Hold] sodium chloride Stopped (05/18/23 0733)   sodium chloride 10 mL/hr at 05/18/23 0800   [MAR Hold] anticoagulant sodium citrate     heparin Stopped (05/18/23 0747)   [MAR Hold] nitroGLYCERIN 135 mcg/min (05/18/23 0800)   [MAR Hold] piperacillin-tazobactam (ZOSYN)  IV Stopped (05/18/23 2130)    Scheduled Medications:  [MAR Hold] aspirin  81 mg Oral Daily   [MAR Hold] atorvastatin  20 mg Oral Daily   [MAR Hold] Chlorhexidine Gluconate Cloth  6 each Topical Q0600   [MAR Hold] Chlorhexidine Gluconate Cloth  6 each Topical Q0600   heparin sodium  (porcine)       [MAR Hold] heparin sodium (porcine)  4,000 Units Intracatheter Once   [MAR Hold] insulin aspart  0-9 Units Subcutaneous Q4H    have reviewed scheduled and prn medications.  Physical Exam: General:NAD, comfortable Heart:RRR, s1s2 nl Lungs:clear b/l, no crackle Abdomen:soft, Non-tender, non-distended Extremities:No edema Dialysis Access: Right IJ TDC in place.  Mariaclara Spear Prasad Kaine Mcquillen 05/18/2023,9:27 AM  LOS: 1 day

## 2023-05-18 NOTE — Progress Notes (Signed)
Progress Note  Patient Name: Zachary Lynch MRN: [id number] DOB: 1985/12/18 Date of Encounter: 05/18/2023  Attending physician: Cheri Fowler, MD Primary care provider: [name], NP  Subjective: Zachary Lynch is a 37 y.o. African-American male who was seen and examined at holding area post heart catheterization Denies anginal chest pain. Shortness of breath is improved significantly-able to lay flat. Did have a tunnel catheter placed over the right anterior chest wall status post dialysis session x 1  Objective: Vital Signs in the last 24 hours: Temp:  [97.4 F (36.3 C)-98.5 F (36.9 C)] 98.2 F (36.8 C) (08/28 1130) Pulse Rate:  [0-125] 106 (08/28 1130) Resp:  [7-39] 24 (08/28 1130) BP: (122-176)/(81-127) 133/83 (08/28 1130) SpO2:  [90 %-100 %] 96 % (08/28 1130) Weight:  [99 kg] 99 kg (08/28 0430)  Intake/Output:  Intake/Output Summary (Last 24 hours) at 05/18/2023 1212 Last data filed at 05/18/2023 1100 Gross per 24 hour  Intake 1341.66 ml  Output 2050 ml  Net -708.34 ml    Net IO Since Admission: -891.57 mL [05/18/23 1212]  Weights:     05/18/2023    4:30 AM 05/17/2023    4:48 AM 01/14/2023    1:37 PM  Last 3 Weights  Weight (lbs) 218 lb 4.1 oz 220 lb 250 lb 8 oz  Weight (kg) 99 kg 99.791 kg 113.626 kg      Physical examination: PHYSICAL EXAM: Vitals:   05/18/23 1035 05/18/23 1040 05/18/23 1100 05/18/23 1130  BP: (!) 133/95 136/87 (!) 134/104 133/83  Pulse: (!) 101 100 (!) 102 (!) 106  Resp: (!) 22 (!) 21 (!) 22 (!) 24  Temp:    98.2 F (36.8 C)  TempSrc:    Axillary  SpO2: 97% 93% 98% 96%  Weight:      Height:        Physical Exam  Constitutional: No distress. He appears chronically ill.  Appears older than stated age, hemodynamically stable.   Neck: No JVD present.  Cardiovascular: Regular rhythm, S1 normal and S2 normal. Tachycardia present. Exam reveals no gallop, no S3 and no S4.  No murmur heard. Pulmonary/Chest: Effort normal and breath sounds  normal. No stridor. He has no wheezes. He has no rales.  Dialysis catheter present over the right anterior chest wall  Abdominal: Soft. Bowel sounds are normal. He exhibits no distension. There is no abdominal tenderness.  Musculoskeletal:        General: Edema (Trace) present.     Cervical back: Neck supple.  Neurological: He is alert and oriented to person, place, and time. He has intact cranial nerves (2-12).  Skin: Skin is warm and moist.  Abscess over the right scrotal sac currently nondraining.  Lab Results: Chemistry Recent Labs  Lab 05/17/23 0506 05/17/23 1201 05/18/23 0632  NA 137 138 137  K 4.0 4.3 3.7  CL 106 107 101  CO2 11* 12* 17*  GLUCOSE 100* 86 85  BUN 147* 150* 113*  CREATININE 21.61* 21.88* 16.96*  CALCIUM 7.6* 7.6* 7.8*  PROT 7.0 7.0  --   ALBUMIN 3.6 3.3* 2.8*  AST 21 23  --   ALT 14 15  --   ALKPHOS 56 57  --   BILITOT 0.4 0.4  --   GFRNONAA 2* 2* 3*  ANIONGAP 20* 19* 19*    Hematology Recent Labs  Lab 05/17/23 0506 05/17/23 1201 05/18/23 0632  WBC 18.0* 16.1* 14.3*  RBC 3.41* 3.47* 2.82*  HGB 10.4* 10.7* 8.4*  HCT 31.5*  32.7* 24.8*  MCV 92.4 94.2 87.9  MCH 30.5 30.8 29.8  MCHC 33.0 32.7 33.9  RDW 14.4 14.5 14.3  PLT 273 294 239   High Sensitivity Troponin:   Recent Labs  Lab 05/17/23 0506 05/17/23 0802 05/17/23 1000  TROPONINIHS 4,584* 5,699* 5,441*     Cardiac EnzymesNo results for input(s): "TROPONINI" in the last 168 hours. No results for input(s): "TROPIPOC" in the last 168 hours.  BNP Recent Labs  Lab 05/17/23 0506  BNP 925.0*    DDimer  Recent Labs  Lab 05/17/23 0802  DDIMER 2.14*    Hemoglobin A1c:  Lab Results  Component Value Date   HGBA1C 5.8 (H) 05/17/2023   MPG 119.76 05/17/2023   TSH  Recent Labs    06/03/22 0000  TSH 2.90   Lipid Panel  Lab Results  Component Value Date   CHOL 102 05/18/2023   HDL 26 (L) 05/18/2023   LDLCALC 62 05/18/2023   LDLDIRECT 60 05/18/2023   TRIG 70 05/18/2023    CHOLHDL 3.9 05/18/2023   Drugs of Abuse     Component Value Date/Time   LABOPIA NONE DETECTED 05/17/2023 1124   COCAINSCRNUR NONE DETECTED 05/17/2023 1124   LABBENZ NONE DETECTED 05/17/2023 1124   AMPHETMU NONE DETECTED 05/17/2023 1124   THCU NONE DETECTED 05/17/2023 1124   LABBARB NONE DETECTED 05/17/2023 1124      Imaging: IR Fluoro Guide CV Line Right  Result Date: 05/18/2023 INDICATION: ESRD requiring HD. Pulmonary edema. Tunnelled HD cath placement, sooner rather then later per nephro if possible EXAM: TUNNELED CENTRAL VENOUS HEMODIALYSIS CATHETER PLACEMENT WITH ULTRASOUND AND FLUOROSCOPIC GUIDANCE MEDICATIONS: None. ANESTHESIA/SEDATION: Moderate (conscious) sedation was employed during this procedure. A total of Versed 1 mg and Fentanyl 50 mcg was administered intravenously. Moderate Sedation Time: 18 minutes. The patient's level of consciousness and vital signs were monitored continuously by radiology nursing throughout the procedure under my direct supervision. FLUOROSCOPY TIME:  Fluoroscopic dose; 4 mGy COMPLICATIONS: None immediate. PROCEDURE: Informed written consent was obtained from the patient and/or patient's representative after a discussion of the risks, benefits, and alternatives to treatment. Questions regarding the procedure were encouraged and answered. The RIGHT neck and chest were prepped with chlorhexidine in a sterile fashion, and a sterile drape was applied covering the operative field. Maximum barrier sterile technique with sterile gowns and gloves were used for the procedure. A timeout was performed prior to the initiation of the procedure. After creating a small venotomy incision, a micropuncture kit was utilized to access the internal jugular vein. Real-time ultrasound guidance was utilized for vascular access including the acquisition of a permanent ultrasound image documenting patency of the accessed vessel. The microwire was utilized to measure appropriate catheter  length. A stiff Glidewire was advanced to the level of the IVC and the micropuncture sheath was exchanged for a peel-away sheath. A palindrome tunneled hemodialysis catheter measuring 23 cm from tip to cuff was tunneled in a retrograde fashion from the anterior chest wall to the venotomy incision. The catheter was then placed through the peel-away sheath with tips ultimately positioned within the superior aspect of the right atrium. Final catheter positioning was confirmed and documented with a spot radiographic image. The catheter aspirates and flushes normally. The catheter was flushed with appropriate volume heparin dwells. The catheter exit site was secured with a 0-Prolene retention suture. The venotomy incision was closed with Dermabond. Dressings were applied. The patient tolerated the procedure well without immediate post procedural complication. IMPRESSION: Successful placement of 23  cm tip to cuff tunneled hemodialysis catheter via the RIGHT internal jugular vein The tip of the catheter is positioned within the proximal RIGHT atrium. The catheter is ready for immediate use. [name], MD [facility] Radiology Electronically Signed   By: [name] M.D.   On: 05/18/2023 10:16   IR US Guide Vasc Access Right  Result Date: 05/18/2023 INDICATION: ESRD requiring HD. Pulmonary edema. Tunnelled HD cath placement, sooner rather then later per nephro if possible EXAM: TUNNELED CENTRAL VENOUS HEMODIALYSIS CATHETER PLACEMENT WITH ULTRASOUND AND FLUOROSCOPIC GUIDANCE MEDICATIONS: None. ANESTHESIA/SEDATION: Moderate (conscious) sedation was employed during this procedure. A total of Versed 1 mg and Fentanyl 50 mcg was administered intravenously. Moderate Sedation Time: 18 minutes. The patient's level of consciousness and vital signs were monitored continuously by radiology nursing throughout the procedure under my direct supervision. FLUOROSCOPY TIME:   Fluoroscopic dose; 4 mGy COMPLICATIONS: None immediate. PROCEDURE: Informed written consent was obtained from the patient and/or patient's representative after a discussion of the risks, benefits, and alternatives to treatment. Questions regarding the procedure were encouraged and answered. The RIGHT neck and chest were prepped with chlorhexidine in a sterile fashion, and a sterile drape was applied covering the operative field. Maximum barrier sterile technique with sterile gowns and gloves were used for the procedure. A timeout was performed prior to the initiation of the procedure. After creating a small venotomy incision, a micropuncture kit was utilized to access the internal jugular vein. Real-time ultrasound guidance was utilized for vascular access including the acquisition of a permanent ultrasound image documenting patency of the accessed vessel. The microwire was utilized to measure appropriate catheter length. A stiff Glidewire was advanced to the level of the IVC and the micropuncture sheath was exchanged for a peel-away sheath. A palindrome tunneled hemodialysis catheter measuring 23 cm from tip to cuff was tunneled in a retrograde fashion from the anterior chest wall to the venotomy incision. The catheter was then placed through the peel-away sheath with tips ultimately positioned within the superior aspect of the right atrium. Final catheter positioning was confirmed and documented with a spot radiographic image. The catheter aspirates and flushes normally. The catheter was flushed with appropriate volume heparin dwells. The catheter exit site was secured with a 0-Prolene retention suture. The venotomy incision was closed with Dermabond. Dressings were applied. The patient tolerated the procedure well without immediate post procedural complication. IMPRESSION: Successful placement of 23 cm tip to cuff tunneled hemodialysis catheter via the RIGHT internal jugular vein The tip of the catheter is  positioned within the proximal RIGHT atrium. The catheter is ready for immediate use. [name], MD [facility] Radiology Electronically Signed   By: [name] M.D.   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 Left and right heart catheterization 05/18/2023: Right dominant circulation Calcified and ectatic vessels with minimal irregularities No obstructive coronary artery disease RA: 5 mmHg RV: 39/5 mmHg PA: 29/19 mmHg, mPAP 23 mmHg PCW: 17  mmHg LV: 139/1 mmHg, LVEDP 29 mmHg CO: 9.4 L/min CI: 4.3 L/min/m2 [name], MD Pager: [phone] Office: [phone]  DG Chest Port 1 View  Result Date: 05/18/2023 CLINICAL DATA:  Pulmonary edema. EXAM: PORTABLE CHEST 1 VIEW COMPARISON:  May 17, 2023. FINDINGS: Stable cardiomediastinal silhouette. Right internal jugular catheter is noted with tip in expected position of cavoatrial junction. Both lungs are clear. Subacute to old left clavicular fracture is noted. IMPRESSION: No active disease. Electronically Signed   By: Lupita Raider M.D.   On: 05/18/2023 07:50   ECHOCARDIOGRAM COMPLETE  Result Date: 05/17/2023    ECHOCARDIOGRAM REPORT   Patient Name:   Zachary Lynch [facility] Date of Exam: 05/17/2023 Medical Rec #:  [id number]    Height:       71.0 in Accession #:    [id number]   Weight:       220.0 lb Date of Birth:  [birth year]     BSA:           2.196 m Patient Age:    37 years     BP:           122/104 mmHg Patient Gender: M            HR:           120 bpm. Exam Location:  Inpatient Procedure: 2D Echo, Cardiac Doppler, Color Doppler and Intracardiac            Opacification Agent Indications:    CHF  History:        Patient has prior history of Echocardiogram examinations, most                 recent 06/06/2018. Cardiomyopathy, Stroke; Risk Factors:Obesity,                 Dyslipidemia and Hypertension. CKD.  Sonographer:    [name] Referring Phys: [id number] JASON MESNER IMPRESSIONS  1. Left ventricular ejection fraction, by estimation, is 25%. The left ventricle has severely decreased function. The left ventricle demonstrates regional wall motion abnormalities (see scoring diagram/findings for description). The left ventricular internal cavity size was mildly dilated. There is moderate concentric left ventricular hypertrophy. Left ventricular diastolic parameters are indeterminate. Largely hypokinetic save for the apex.  2. Right ventricular systolic function is hyperdynamic. The right ventricular size is normal.  3. Left atrial size was severely dilated.  4. The mitral valve is grossly normal. Mild mitral valve regurgitation. No evidence of mitral stenosis.  5. The aortic valve was not well visualized. There is mild thickening of the aortic valve. Aortic valve regurgitation is not visualized.  6. The inferior vena cava is normal in size with greater than 50% respiratory variability, suggesting right atrial pressure of 3 mmHg. Comparison(s): Prior images reviewed side by side. Compared to 2019 report, LVEF is similar. Elevated heart rates and narrow pulse pressure were not reported at prior reports. Conclusion(s)/Recommendation(s): Will reach out to primary team. FINDINGS  Left Ventricle: Left ventricular ejection fraction, by estimation, is 25%. The left ventricle has severely decreased function. The left ventricle demonstrates regional wall  motion abnormalities. Definity contrast agent was given IV to delineate the left  ventricular endocardial borders. The left ventricular internal cavity size was mildly dilated. There is moderate concentric left ventricular hypertrophy. Left ventricular diastolic parameters are indeterminate.  LV Wall Scoring: The anterior wall, antero-lateral wall, anterior septum, inferior wall, posterior wall, mid inferoseptal segment, and basal inferoseptal segment are hypokinetic. Largely hypokinetic save for  the apex. Right Ventricle: The right ventricular size is normal. No increase in right ventricular wall thickness. Right ventricular systolic function is hyperdynamic. Left Atrium: Left atrial size was severely dilated. Right Atrium: Right atrial size was normal in size. Pericardium: Trivial pericardial effusion is present. The pericardial effusion is circumferential. Mitral Valve: The mitral valve is grossly normal. Mild mitral valve regurgitation. No evidence of mitral valve stenosis. Tricuspid Valve: The tricuspid valve is normal in structure. Tricuspid valve regurgitation is not demonstrated. No evidence of tricuspid stenosis. Aortic Valve: The aortic valve was not well visualized. There is mild thickening of the aortic valve. Aortic valve regurgitation is not visualized. Pulmonic Valve: The pulmonic valve was normal in structure. Pulmonic valve regurgitation is trivial. No evidence of pulmonic stenosis. Aorta: The aortic root, ascending aorta and aortic arch are all structurally normal, with no evidence of dilitation or obstruction. Venous: The inferior vena cava is normal in size with greater than 50% respiratory variability, suggesting right atrial pressure of 3 mmHg. IAS/Shunts: The interatrial septum was not well visualized.  LEFT VENTRICLE PLAX 2D LVIDd:         6.10 cm   Diastology LVIDs:         5.25 cm   LV e' medial:  10.09 cm/s LV PW:         1.40 cm   LV e' lateral: 8.16 cm/s LV IVS:        1.30 cm LVOT diam:      2.20 cm LVOT Area:     3.80 cm  RIGHT VENTRICLE             IVC RV S prime:     12.30 cm/s  IVC diam: 1.70 cm LEFT ATRIUM              Index        RIGHT ATRIUM           Index LA diam:        4.80 cm  2.19 cm/m   RA Area:     14.60 cm LA Vol (A2C):   124.0 ml 56.47 ml/m  RA Volume:   34.50 ml  15.71 ml/m LA Vol (A4C):   113.0 ml 51.46 ml/m LA Biplane Vol: 123.0 ml 56.02 ml/m   AORTA Ao Root diam: 3.40 cm Ao Asc diam:  3.40 cm MR Peak grad: 55.1 mmHg MR Vmax:      371.00 cm/s SHUNTS                           Systemic Diam: 2.20 cm Riley Lam MD Electronically signed by Riley Lam MD Signature Date/Time: 05/17/2023/4:22:43 PM    Final    CT PELVIS W CONTRAST  Result Date: 05/17/2023 CLINICAL DATA:  Scrotal swelling or edema. Scrotal abscess. Evaluate for Fournier's gangrene. EXAM: CT PELVIS WITH CONTRAST TECHNIQUE: Multidetector CT imaging of the pelvis was performed using the standard protocol following the bolus administration of intravenous contrast. RADIATION DOSE REDUCTION: This exam was performed according to the departmental dose-optimization program which includes automated exposure control, adjustment of the mA and/or kV according to patient size and/or use of iterative reconstruction technique. CONTRAST:  75mL OMNIPAQUE IOHEXOL 350 MG/ML SOLN COMPARISON:  Abdominopelvic CT 07/19/2016. FINDINGS: Urinary Tract: The visualized distal ureters and bladder appear unremarkable. Bowel: No bowel wall thickening, distention or surrounding inflammation identified within the pelvis. No enteric contrast administered. Vascular/Lymphatic: No enlarged pelvic lymph nodes identified. There are  small external iliac and inguinal lymph nodes bilaterally, similar to previous study, likely reactive. Minimal iliac atherosclerosis without evidence of aneurysm. Reproductive: The prostate gland and seminal vesicles appear unremarkable. The visualized penis appears unremarkable. The scrotum is  incompletely visualized by this examination. Other: No focal pelvic inflammatory changes or soft tissue emphysema identified. As above, the scrotum and perineum are incompletely visualized. Musculoskeletal: No acute osseous findings. No evidence of femoral head osteonecrosis or acute fracture. Suspected chronic sacroiliitis bilaterally. IMPRESSION: 1. No focal pelvic inflammatory changes or soft tissue emphysema identified. 2. The scrotum and perineum are incompletely visualized by this examination. Consider scrotal ultrasound for further evaluation. Consider urology consult. 3. Suspected chronic sacroiliitis bilaterally. Electronically Signed   By: Carey Bullocks M.D.   On: 05/17/2023 15:51   DG Chest 2 View  Result Date: 05/17/2023 CLINICAL DATA:  Shortness of breath and congestion. Cough and emesis. Recent diagnosis of pancreatitis. EXAM: CHEST - 2 VIEW COMPARISON:  10/18/20 FINDINGS: The heart size and mediastinal contours are within normal limits. Both lungs are clear. The visualized skeletal structures are unremarkable. IMPRESSION: No active cardiopulmonary disease. Electronically Signed   By: Signa Kell M.D.   On: 05/17/2023 06:28    CARDIAC DATABASE: EKG: 05/17/2023:. 6:52 AM: Sinus tachycardia, 114 bpm, TWI in the high lateral and lateral leads consider lateral ischemia, prolonged QT. 1157: Sinus tachycardia, 110 bpm, LVH per voltage criteria, ST-T changes likely secondary to LVH.   Echocardiogram: Per EMR: LVEF 15% in 2010. LVEF 25% in 2016. LVEF 30-35% 2019   05/17/2023  1. Left ventricular ejection fraction, by estimation, is 25%. The left  ventricle has severely decreased function. The left ventricle demonstrates  regional wall motion abnormalities (see scoring diagram/findings for  description). The left ventricular  internal cavity size was mildly dilated. There is moderate concentric left  ventricular hypertrophy. Left ventricular diastolic parameters are  indeterminate.  Largely hypokinetic save for the apex.   2. Right ventricular systolic function is hyperdynamic. The right  ventricular size is normal.   3. Left atrial size was severely dilated.   4. The mitral valve is grossly normal. Mild mitral valve regurgitation.  No evidence of mitral stenosis.   5. The aortic valve was not well visualized. There is mild thickening of  the aortic valve. Aortic valve regurgitation is not visualized.   6. The inferior vena cava is normal in size with greater than 50%  respiratory variability, suggesting right atrial pressure of 3 mmHg.   Heart catheterization: 05/18/2023 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)  Scheduled Meds:  aspirin  81 mg Oral Daily   atorvastatin  20 mg Oral Daily   Chlorhexidine Gluconate Cloth  6 each Topical Q0600   Chlorhexidine Gluconate Cloth  6 each Topical Q0600   doxazosin  2 mg Oral QHS   ferric citrate  420 mg Oral TID WC   heparin  5,000 Units Subcutaneous Q8H   heparin sodium (porcine)       heparin sodium (porcine)  4,000 Units Intracatheter Once   hydrALAZINE  50 mg Oral Q6H   insulin aspart  0-9 Units Subcutaneous Q4H   isosorbide mononitrate  60 mg Oral Daily   sodium chloride flush  3 mL Intravenous Q12H    Continuous Infusions:  sodium chloride Stopped (05/18/23 0733)   sodium chloride     anticoagulant sodium citrate     anticoagulant sodium citrate     ferric gluconate (FERRLECIT) IVPB 250 mg (05/18/23 1155)   magnesium sulfate bolus IVPB 2 g (05/18/23 1157)   nitroGLYCERIN 145 mcg/min (05/18/23 1100)   piperacillin-tazobactam (ZOSYN)  IV  2.25 g (05/18/23 1103)    PRN Meds: sodium chloride, sodium chloride, acetaminophen, alteplase, alteplase, anticoagulant sodium citrate, anticoagulant sodium citrate, docusate sodium, heparin, heparin, heparin, heparin sodium (porcine), hydrALAZINE, iohexol, labetalol, lidocaine (PF), lidocaine (PF), lidocaine-prilocaine, lidocaine-prilocaine, ondansetron (ZOFRAN) IV, mouth rinse, pentafluoroprop-tetrafluoroeth, pentafluoroprop-tetrafluoroeth, polyethylene glycol, sodium chloride flush   IMPRESSION & RECOMMENDATIONS: Zachary Lynch is a 37 y.o. 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   Impression: NSTEMI-type II. Acute on chronic exacerbation of systolic and diastolic heart failure, stage C, NYHA class II/III. Nonischemic cardiomyopathy Chronic kidney disease stage V - initiated on dialysis 05/17/2023 Hypertensive crisis on admission-blood pressures improving. 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 Underwent left and right heart catheterization earlier this morning which noted findings of a nonischemic cardiomyopathy. Discontinue IV heparin drip Wean off nitro drip. Will start hydralazine and imdur. Hold beta blocker for now due reduced LVEF and HF.  Patient was started on amlodipine 10 mg p.o. daily and carvedilol 50 mg p.o. twice daily.  Discontinued both orders. Spoke to ordering provider.  Volume management per nephrology Patient was being followed by Dr.  Gala Romney from advanced heart failure several years ago given his nonischemic cardiomyopathy which had improved with medical management.  However, given his young age and severely reduced LVEF we will reach out advanced heart failure to reestablish care. I am very hopeful that with reinitiation of his insurance he will follow-up and if he shows compliance could benefit from advanced therapy in the future. Patient still requires medication titration and volume management prior to discharge.  Chronic kidney disease stage V: Status post tunneled catheter on 05/17/2023. Status post first hemodialysis session on 05/17/2023. Nephrology following  Hypertensive crisis on admission: Hypertension with CKD stage V with heart failure Slowly improving. Medication changes as discussed above.  History of right MCA stroke in 2010: Reemphasized the importance of secondary prevention with focus on improving her modifiable cardiovascular risk factors such as glycemic control, lipid management, blood pressure control, weight loss. Recommend aspirin 81 mg p.o. daily and statin therapy  Scrotal infection: Management per primary team and surgery.  Patient's questions and concerns were addressed to his satisfaction. He voices understanding of the instructions provided during this encounter.   This note was created using a voice recognition software as a result there may be grammatical errors inadvertently enclosed that do not reflect the nature of this encounter. Every attempt is made to correct such errors.  Delilah Shan [name]  Pager:  [phone] Office: [phone] 05/18/2023, 12:12 PM

## 2023-05-18 NOTE — Progress Notes (Addendum)
   05/18/23 0345  Vitals  Temp (!) 97.4 F (36.3 C)  Temp Source Oral  BP (!) 129/99  MAP (mmHg) 110  BP Location Left Arm  BP Method Automatic  Patient Position (if appropriate) Lying  Pulse Rate (!) 116  Pulse Rate Source Monitor  ECG Heart Rate (!) 115  Resp 17  Oxygen Therapy  SpO2 99 %  During Treatment Monitoring  Blood Flow Rate (mL/min) 200 mL/min  Arterial Pressure (mmHg) -70 mmHg  Venous Pressure (mmHg) 70 mmHg  TMP (mmHg) 5 mmHg  Ultrafiltration Rate (mL/min) 400 mL/min  Dialysate Flow Rate (mL/min) 200 ml/min  Dialysate Potassium Concentration 3  Dialysate Calcium Concentration 2.5  Duration of HD Treatment -hour(s) 2 hour(s)  HD Safety Checks Performed Yes  Intra-Hemodialysis Comments Tx completed  Post Treatment  Dialyzer Clearance Lightly streaked  Liters Processed 24  Fluid Removed (mL) 500 mL  Tolerated HD Treatment Yes  Post-Hemodialysis Comments Pt. tolerated treatm,ent well  Hemodialysis Catheter Right Internal jugular Double lumen Permanent (Tunneled)  Placement Date/Time: 05/17/23 1323   Serial / Lot #: [id number]  Expiration Date: 11/18/27  Time Out: Correct patient;Correct site;Correct procedure  Maximum sterile barrier precautions: Hand hygiene;Cap;Mask;Sterile gown;Sterile gloves;Large sterile s...  Site Condition No complications  Blue Lumen Status Heparin locked  Red Lumen Status Heparin locked  Purple Lumen Status Heparin locked  Catheter fill solution Heparin 1000 units/ml  Catheter fill volume (Arterial) 1.9 cc  Catheter fill volume (Venous) 1.9  Dressing Type Transparent  Dressing Status Antimicrobial disc in place;Clean, Dry, Intact  Drainage Description None  Post treatment catheter status Capped and Clamped   Post treatment Pt stable net UF Fidela Juneau RN.

## 2023-05-18 NOTE — Progress Notes (Signed)
Case discussed with nephrologist. Requested to see pt for out-pt HD needs at d/c. Met with pt at bedside. Pt gave permission to speak in front of visitors. Introduced self and explained role. Pt lives in [place], [place] and was working F/T prior to admission. Pt states he is currently uninsured and new insurance through employer is supposed to start Oct 1. Discussed clinic options and pt prefers [facility] if possible. Referral submitted to [facility] admissions today for review. Pt's referral will require financial clearance due to pt being uninsured and this normally takes some time to be approved. Pt plans to drive self to HD appts at d/c but visitor at bedside said she could assist if needed. Will follow and assist.   Olivia Canter Renal Navigator [phone]

## 2023-05-18 NOTE — Progress Notes (Signed)
Upper extremity venous mapping completed. Please see CV Procedures for preliminary results.  Shona Simpson, RVT 05/18/23 1:32 PM

## 2023-05-18 NOTE — Progress Notes (Signed)
VASCULAR SURGERY:  His vein map is pending.  Currently he is not medically stable for placement of new access.  He has a functioning catheter.  He has a scrotal abscess and had a recent NSTEMI.  I do not think that he will be ready for new access anytime soon.  Cari Caraway, MD 6:51 AM

## 2023-05-18 NOTE — Interval H&P Note (Signed)
History and Physical Interval Note:  05/18/2023 8:39 AM  Zachary Lynch  has presented today for surgery, with the diagnosis of nstemi.  The various methods of treatment have been discussed with the patient and family. After consideration of risks, benefits and other options for treatment, the patient has consented to  Procedure(s): RIGHT/LEFT HEART CATH AND CORONARY ANGIOGRAPHY (N/A) as a surgical intervention.  The patient's history has been reviewed, patient examined, no change in status, stable for surgery.  I have reviewed the patient's chart and labs.  Questions were answered to the patient's satisfaction.      Libbi Towner J Roel Douthat

## 2023-05-18 NOTE — Progress Notes (Signed)
Initial Nutrition Assessment  DOCUMENTATION CODES:   Severe malnutrition in context of chronic illness  INTERVENTION:   If po intake inadequate on follow-up, recommend liberalizing diet to Regular. Pt non-oliguric and do not recommend strict fluid restriction at this time.   All double protein/entree at meals.   Small Frequent Meals. RD added scheduled snacks TID between meals   Offered oral nutrition supplements at this time but pt declined stating he does not like them  Add Renal MVI daily, add Thiamine 100 mg daily x 10 days, add Vitamin C 250 mg BID x 30 days  NUTRITION DIAGNOSIS:   Severe Malnutrition related to chronic illness as evidenced by moderate fat depletion, energy intake < 75% for > or equal to 1 month, percent weight loss.  GOAL:   Patient will meet greater than or equal to 90% of their needs  MONITOR:   PO intake, Labs, Weight trends  REASON FOR ASSESSMENT:   Consult Assessment of nutrition requirement/status, Diet education  ASSESSMENT:   37 yo male admitted with acute NSTEMI with acute on chronic HFrEF, CKD with progression to ESRD requiring emergent RRT, scrotal cellulitis vs abscess. PMH includes HTN, HLD, HFeEF with NICM, CVA, CKD IV, GERD, gout, obesity  8/27 Admitted to [facility], transferred to [facility], tunneled HD cath placed, 1st iHD completed, surgery evaluated for scrotal abscess-no surgical intervention needed  Cardiac cath today. Pt states he feels much better today than he did yesterday. Noted plan for iHD again today. Non-oliguric with UOP 1350 mL in 24 hours, 500 mL thus far today  Phosphorus down to 7 this AM, potassium 3.7 (wdl). Noted iPTH pending  Noted hypoglcycemia yesterday afternoon, CBGs 80s overnight, 82 this AM  Current wt 99 kg; significant wt loss trend per weight encounters. Unsure if dry weights  UBW around 300-310 pounds; pt reports he weighed 260 pounds in April (250 on 01/14/23) and current wt 218 pounds. 13-16% wt loss in 4  months; >/= 27% wt loss in 1 year. Wt loss was all unintentional per pt. Pt had been vomiting off and on, sometimes daily, sometimes after every meal for the past 1 year. Pt reports sometimes he only eats a very small amount as he is afraid he will throw up. When feeling well, he typically eats 1-2 meals per day. He does not eat breakfast,  He does not like "green vegetables", eats potatoes and corn (and not many other veggies per family). Pt does like fruit. Meals vary by size, sometimes he just grabs a few slices of [place], other times he may eat a roast with potatoes, etc.   NPO this AM for procedure, diet re-advanced and waiting on meal tray. Noted +emesis yesterday, type 5 BM as well  Pt denies trouble chewing or swallowing; he does note one tooth has been feeling loose lately and noted bleeding gums.   Labs: BUN 113, Creatinine 16.96, HGbA1c 5.8 Meds: ayurxia with meals, ferric gluconate, ss novolog  NUTRITION - FOCUSED PHYSICAL EXAM:   RD able to note significant loss on exam; pt with lots of loose skin in addition to muscle depletions, some subcutaneous fat loss  Flowsheet Row Most Recent Value  Orbital Region No depletion  Upper Arm Region Moderate depletion  Thoracic and Lumbar Region Moderate depletion  Buccal Region No depletion  Temple Region Moderate depletion  Clavicle Bone Region Severe depletion  Clavicle and Acromion Bone Region Severe depletion  Scapular Bone Region Severe depletion  Dorsal Hand Mild depletion  Patellar Region Moderate  depletion  Anterior Thigh Region Moderate depletion  Posterior Calf Region Unable to assess  Edema (RD Assessment) Mild       Diet Order:   Diet Order             Diet Heart Room service appropriate? Yes; Fluid consistency: Thin  Diet effective now                   EDUCATION NEEDS:   Education needs have been addressed  Skin:  Skin Assessment: Skin Integrity Issues: Skin Integrity Issues:: Other (Comment) Other:  scrotal abscess  Last BM:  8/27  Height:   Ht Readings from Last 1 Encounters:  05/17/23 5\' 11"  (1.803 m)    Weight:   Wt Readings from Last 1 Encounters:  05/18/23 99 kg     BMI:  Body mass index is 30.44 kg/m.  Estimated Nutritional Needs:   Kcal:  2200-2400 kcals  Protein:  120-140 g  Fluid:  1L plus UOP (currently non-oliguric)   Romelle Starcher MS, RDN, LDN, CNSC Registered Dietitian 3 Clinical Nutrition RD Pager and On-Call Pager Number Located in [place]

## 2023-05-18 NOTE — Progress Notes (Signed)
Heart Failure Navigator Progress Note  Assessed for Heart & Vascular TOC clinic readiness.  Patient does not meet criteria due to [facility] cardiology been consulted.   Navigator will sign off at this time.  Lloyd Cullinan,RN, BSN,MSN Heart Failure Nurse Navigator. Contact by secure chat only.

## 2023-05-18 NOTE — Progress Notes (Signed)
Site area: R groin Site Prior to Removal:  Level 0  Pressure Applied For: 25 min arterial, 20 min. venous Manual: yes    Patient Status During Pull:  stable Post Pull Site:  Level 0 Post Pull Instructions Given: yes   Post Pull Pulses Present: +2 DP & +2 PT Dressing Applied:  gauze & tegaderm  Bedrest begins @ 1027 Comments:

## 2023-05-18 NOTE — H&P (Addendum)
NAME:  Zachary Lynch, MRN:  [id number], DOB:  03-01-1986, LOS: 1 ADMISSION DATE:  05/17/2023 CONSULTATION DATE:  05/17/2023 REFERRING MD:  Eloise Harman - EDP ([facility]) CHIEF COMPLAINT:  NSTEMI, HD needs, scrotal infection   History of Present Illness:  37 year old man who presented to [facility] 8/27 as a transfer from [facility] for NSTEMI and need for new HD start. 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  Presented to [facility] ED 8/27AM for nausea/vomiting, SOB/dyspnea x 1-2 days. Reported he has unfortunately had financial issues with obtaining his antihypertensive medications and has been noncompliant as a result of this; he was also concerned about possible groin abscess with drainage. Denies fever/chills, endorses CP/SOB. Endorses nausea/vomiting, no diarrhea, +history of elevated lipase, no known gallbladder issues/history of pancreatitis. No significant EtOH use. +Weight loss 60lb over several months, unintentional. +Groin swelling/drainage.   On ED arrival, patient was afebrile, tachycardic to 108, hypertensive to 168/138, RR 29, SpO2 94%. Scrotal abscess was noted with +drainage (2 x 2cm). Labs were notable for WBC 18, Hgb 10.4, Plt 273. INR 1.3. Na 137, K 4.0, CO2 11, BUN/Cr 147/21.61. LFTs WNL. BNP 925. Trop 4584 > 5699 > 5441. Concern for NSTEMI. Cardiology [facility]) consulted with recommendation for transfer to [facility] for further cardiac care, HD initiation and management of scrotal abscess.  PCCM consulted for ICU admission/transfer.  Pertinent Medical History:   Past Medical History:  Diagnosis Date   Arthritis    CKD stage 4 secondary to hypertension (HCC) 03/28/2017   Elevated blood uric acid level    Essential hypertension    Folliculitis    Managed with doxycycline, topical  clindamycin/benzoyl peroxide   GERD (gastroesophageal reflux disease)    Gout    HFrEF (heart failure with reduced ejection fraction) (HCC)    Echo 2019 with EF 30-35%, hypokinesis   History of cardiomyopathy    LVEF 15% in 2010 - evaluated by [facility] and Dr. Graciela Husbands   History of stroke 2010   Right MCA distribution infarct, felt to be possibly embolic in the setting of cardiomyopathy - placed on Coumadin at that time   Hyperlipidemia    Nonischemic cardiomyopathy (HCC) 03/28/2017   Obesity    Prediabetes 05/03/2017   Significant Hospital Events: Including procedures, antibiotic start and stop dates in addition to other pertinent events   8/27 - Presented to [facility] for nausea/vomiting, SOB/dyspnea. NSTEMI with trops > 5K. Cardiology recommended transfer to [facility] for ?heart cath. Scrotal abscess with drainage, no c/f Fournier's. Likely new HD start for volume management. PCCM accepted for ICU admission/transfer. 8/27 tunneled cath placed by IR, 1st session of HD done.  Seen by general surgery for scrotal abscess, no surgical intervention currently needed. 8/28 L/RHC > NICM, mild decompensation, borderline PAH (LVEDP 29).    Interim History / Subjective:  Just back from cardiac cath. Feels a lot better. Able to lie flat now. Planning for 2nd HD treatment today.  Objective:  Blood pressure (!) 136/98, pulse (!) 102, temperature 98.5 F (36.9 C), resp. rate (!) 24, height 5\' 11"  (1.803 m), weight 99 kg, SpO2 95%.        Intake/Output Summary (Last 24 hours) at 05/18/2023 1017 Last data filed at 05/18/2023 0800 Gross per 24 hour  Intake 1323.49 ml  Output 1850 ml  Net -526.51  ml   Filed Weights   05/17/23 0448 05/18/23 0430  Weight: 99.8 kg 99 kg    Physical Examination:  General: Young adult male, resting in bed, in NAD. Neuro: A&O x 3, no deficits. HEENT: [place]/AT. Sclerae anicteric. EOMI. Cardiovascular: RRR, no M/R/G.  Lungs: Respirations even and unlabored.  CTA bilaterally, No  W/R/R. Abdomen: BS x 4, soft, NT/ND.  Musculoskeletal: No gross deformities, no edema.  Skin: Intact, warm, no rashes.   Assessment & Plan:   NSTEMI - Troponins elevated (4584 > 5699 > 5441) with concern for NSTEMI; EKG without ischemic changes. [facility] 8/28 with no obstruction/NICM. Prolonged QTc. Hx NICM (EF30-35% 2019), HTN, HLD. - Cardiology following, appreciate the assistance. - ASA/Statin. - Keep Mag > 2, K > 4. - Will need to re-establish with cards/heart failure.  Hypertensive urgency - resolved. - Wean nitroglycerin gtt to off. - Volume removal per HD. - Resume home Doxazosin. - Add Bidil. - Hold home Amlodipine, Carvedilol after discussion with cards given his low EF.  Dyspnea - improved. Related to above + CHF + volume/ESRD. Doing well on room air. - Supportive care. - Volume removal per HD.  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 AGMA - 2/2 above. Hypomagnesemia. - Nephrology following, appreciate the assistance. - Vascular surgery consulted for fistula placement, appreciate the assistance. Not ready for surgical procedure yet, will need to wait until over acute issues/stabilized. - 2g Mag. - Sodium Bicarb pills. - Follow BMP.  Nausea/vomiting - currently controlled. - Ativan PRN given prolonged QTc.  Scrotal abscess - currently draining purulent material. Attempted I&D in ED but unable to lie flat. No overt signs of gas tracking/nec fasc/etc. No tracking infection per CT pelvis but limited study/limited visualization. Seen by general surgery 8/27 and no acute surgical interventions required, recommended supportive care and if needs further management then consult urology. - Continue Zosyn. - Apply warm compresses qshift to encourage drainage. - Consider urology consult if not draining much and needs I&D or other intervention.  Best Practice: (right click and "Reselect all SmartList Selections" daily)   Diet/type: Regular consistency (see orders) DVT  prophylaxis: prophylactic heparin  GI prophylaxis: N/A Lines: Dialysis Catheter Foley:  N/A Code Status:  full code Last date of multidisciplinary goals of care discussion: None yet.   Critical care time: 30 minutes.     Rutherford Guys, PA - [facility] [place] [facility] For pager details, please see AMION or use Epic chat  After 1900, please call [facility] for cross coverage needs 05/18/2023, 10:17 AM

## 2023-05-19 ENCOUNTER — Encounter (HOSPITAL_COMMUNITY): Payer: Self-pay | Admitting: Cardiology

## 2023-05-19 DIAGNOSIS — E43 Unspecified severe protein-calorie malnutrition: Secondary | ICD-10-CM | POA: Insufficient documentation

## 2023-05-19 LAB — CBC
HCT: 24.2 % — ABNORMAL LOW (ref 39.0–52.0)
Hemoglobin: 8.3 g/dL — ABNORMAL LOW (ref 13.0–17.0)
MCH: 30.2 pg (ref 26.0–34.0)
MCHC: 34.3 g/dL (ref 30.0–36.0)
MCV: 88 fL (ref 80.0–100.0)
Platelets: 223 10*3/uL (ref 150–400)
RBC: 2.75 MIL/uL — ABNORMAL LOW (ref 4.22–5.81)
RDW: 14.3 % (ref 11.5–15.5)
WBC: 12.8 10*3/uL — ABNORMAL HIGH (ref 4.0–10.5)
nRBC: 0.2 % (ref 0.0–0.2)

## 2023-05-19 LAB — MAGNESIUM: Magnesium: 2.1 mg/dL (ref 1.7–2.4)

## 2023-05-19 LAB — RENAL FUNCTION PANEL
Albumin: 2.7 g/dL — ABNORMAL LOW (ref 3.5–5.0)
Anion gap: 15 (ref 5–15)
BUN: 71 mg/dL — ABNORMAL HIGH (ref 6–20)
CO2: 22 mmol/L (ref 22–32)
Calcium: 7.9 mg/dL — ABNORMAL LOW (ref 8.9–10.3)
Chloride: 97 mmol/L — ABNORMAL LOW (ref 98–111)
Creatinine, Ser: 12.21 mg/dL — ABNORMAL HIGH (ref 0.61–1.24)
GFR, Estimated: 5 mL/min — ABNORMAL LOW (ref >60)
Glucose, Bld: 89 mg/dL (ref 70–99)
Phosphorus: 5 mg/dL — ABNORMAL HIGH (ref 2.5–4.6)
Potassium: 3.3 mmol/L — ABNORMAL LOW (ref 3.5–5.1)
Sodium: 134 mmol/L — ABNORMAL LOW (ref 135–145)

## 2023-05-19 LAB — BASIC METABOLIC PANEL
Anion gap: 14 (ref 5–15)
BUN: 73 mg/dL — ABNORMAL HIGH (ref 6–20)
CO2: 21 mmol/L — ABNORMAL LOW (ref 22–32)
Calcium: 7.8 mg/dL — ABNORMAL LOW (ref 8.9–10.3)
Chloride: 97 mmol/L — ABNORMAL LOW (ref 98–111)
Creatinine, Ser: 12.36 mg/dL — ABNORMAL HIGH (ref 0.61–1.24)
GFR, Estimated: 5 mL/min — ABNORMAL LOW (ref >60)
Glucose, Bld: 83 mg/dL (ref 70–99)
Potassium: 3.3 mmol/L — ABNORMAL LOW (ref 3.5–5.1)
Sodium: 132 mmol/L — ABNORMAL LOW (ref 135–145)

## 2023-05-19 LAB — HEPATITIS B CORE ANTIBODY, TOTAL: Hep B Core Total Ab: NONREACTIVE

## 2023-05-19 LAB — LIPOPROTEIN A (LPA): Lipoprotein (a): 128.5 nmol/L — ABNORMAL HIGH (ref <75.0)

## 2023-05-19 LAB — GLUCOSE, CAPILLARY
Glucose-Capillary: 97 mg/dL (ref 70–99)
Glucose-Capillary: 79 mg/dL (ref 70–99)
Glucose-Capillary: 93 mg/dL (ref 70–99)
Glucose-Capillary: 88 mg/dL (ref 70–99)
Glucose-Capillary: 75 mg/dL (ref 70–99)

## 2023-05-19 MED ORDER — ALUM & MAG HYDROXIDE-SIMETH 200-200-20 MG/5ML PO SUSP
30.0000 mL | ORAL | Status: DC | PRN
  Administered 2023-05-19: 30 mL via ORAL
  Filled 2023-05-19: qty 30

## 2023-05-19 MED ORDER — CHLORHEXIDINE GLUCONATE CLOTH 2 % EX PADS
6.0000 | MEDICATED_PAD | Freq: Every day | CUTANEOUS | Status: DC
  Administered 2023-05-19 – 2023-05-20 (×2): 6 via TOPICAL

## 2023-05-19 NOTE — Progress Notes (Addendum)
NAME:  Zachary Lynch, MRN:  [id number], DOB:  15-Feb-1986, LOS: 2 ADMISSION DATE:  05/17/2023 CONSULTATION DATE:  05/17/2023 REFERRING MD:  Eloise Harman - EDP ([facility]) CHIEF COMPLAINT:  NSTEMI, HD needs, scrotal infection   History of Present Illness:  37 year old man who presented to [facility] 8/27 as a transfer from [facility] for NSTEMI and need for new HD start. 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  Presented to [facility] ED 8/27AM for nausea/vomiting, SOB/dyspnea x 1-2 days. Reported he has unfortunately had financial issues with obtaining his antihypertensive medications and has been noncompliant as a result of this; he was also concerned about possible groin abscess with drainage. Denies fever/chills, endorses CP/SOB. Endorses nausea/vomiting, no diarrhea, +history of elevated lipase, no known gallbladder issues/history of pancreatitis. No significant EtOH use. +Weight loss 60lb over several months, unintentional. +Groin swelling/drainage.   On ED arrival, patient was afebrile, tachycardic to 108, hypertensive to 168/138, RR 29, SpO2 94%. Scrotal abscess was noted with +drainage (2 x 2cm). Labs were notable for WBC 18, Hgb 10.4, Plt 273. INR 1.3. Na 137, K 4.0, CO2 11, BUN/Cr 147/21.61. LFTs WNL. BNP 925. Trop 4584 > 5699 > 5441. Concern for NSTEMI. Cardiology [facility]) consulted with recommendation for transfer to [facility] for further cardiac care, HD initiation and management of scrotal abscess.  PCCM consulted for ICU admission/transfer.  Pertinent Medical History:   Past Medical History:  Diagnosis Date   Arthritis    CKD stage 4 secondary to hypertension (HCC) 03/28/2017   Elevated blood uric acid level    Essential hypertension    Folliculitis    Managed with doxycycline, topical  clindamycin/benzoyl peroxide   GERD (gastroesophageal reflux disease)    Gout    HFrEF (heart failure with reduced ejection fraction) (HCC)    Echo 2019 with EF 30-35%, hypokinesis   History of cardiomyopathy    LVEF 15% in 2010 - evaluated by [facility] and Dr. Graciela Husbands   History of stroke 2010   Right MCA distribution infarct, felt to be possibly embolic in the setting of cardiomyopathy - placed on Coumadin at that time   Hyperlipidemia    Nonischemic cardiomyopathy (HCC) 03/28/2017   Obesity    Prediabetes 05/03/2017   Significant Hospital Events: Including procedures, antibiotic start and stop dates in addition to other pertinent events   8/27 - Presented to [facility] for nausea/vomiting, SOB/dyspnea. NSTEMI with trops > 5K. Cardiology recommended transfer to [facility] for ?heart cath. Scrotal abscess with drainage, no c/f Fournier's. Likely new HD start for volume management. PCCM accepted for ICU admission/transfer. 8/27 tunneled cath placed by IR, 1st session of HD done.  Seen by general surgery for scrotal abscess, no surgical intervention currently needed. 8/28 L/RHC > NICM, mild decompensation, borderline PAH (LVEDP 29).    Interim History / Subjective:  Received second session of hemodialysis overnight Remain on room air, stated breathing is better Continue to make about 1.5 L of urine Nitroglycerin was titrated off  Objective:  Blood pressure 118/73, pulse (!) 103, temperature 98.7 F (37.1 C), temperature source Oral, resp. rate 18, height 5\' 11"  (1.803 m), weight 104.7 kg, SpO2 99%.        Intake/Output Summary (Last 24 hours) at 05/19/2023 0911 Last data filed at 05/19/2023 0600 Gross per 24 hour  Intake 1439 ml  Output 2300 ml  Net -861 ml   Filed Weights   05/17/23 0448 05/18/23 0430 05/19/23 0500  Weight: 99.8 kg 99 kg 104.7 kg    Physical Examination:  General: Middle-age African-American male, lying on the bed HEENT: [place]/AT, eyes anicteric.  moist mucus membranes Neuro:  Alert, awake following commands Chest: Coarse breath sounds, no wheezes or rhonchi Heart: Regular rate and rhythm, no murmurs or gallops Abdomen: Soft, nontender, nondistended, bowel sounds present Skin: No rash  Labs and images were reviewed   Resolved Hospital problems  Uremia  Assessment & Plan:  Type II NSTEMI- Troponins elevated (4584 > 5699 > 5441)  Prolonged QTc, improved Acute on chronic HFrEF Appreciate cardiology follow-up Underwent [facility] 8/28 with no obstruction/NICM. Echo gram showed EF 35%. ASA/Statin. 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  Scrotal abscess Currently draining purulent material. Attempted I&D in ED but unable to lie flat. No overt signs of gas tracking/nec fasc/etc. No tracking infection per CT pelvis but limited study/limited visualization. Seen by general surgery 8/27 and no acute surgical interventions required, recommended supportive care and if needs further management then consult urology. Continue Zosyn. Apply warm compresses qshift to encourage drainage.  Severe malnutrition Patient lost about 80 pound weight, unintentional Continue dietary supplements  Best Practice: (right click and "Reselect all SmartList Selections" daily)   Diet/type: Regular consistency (see  orders) DVT prophylaxis: prophylactic heparin  GI prophylaxis: N/A Lines: Dialysis Catheter Foley:  N/A Code Status:  full code Last date of multidisciplinary goals of care discussion: 8/28: Patient was updated at bedside, decision was to continue full scope of care      Cheri Fowler, MD [place] [facility] See Amion for pager If no response to pager, please call [phone] until 7pm After 7pm, Please call E-link [phone]

## 2023-05-19 NOTE — Progress Notes (Signed)
   05/19/23 0130  Vitals  Temp 98.7 F (37.1 C)  Temp Source Axillary  BP 111/70  MAP (mmHg) 79  BP Location Left Arm  BP Method Automatic  Patient Position (if appropriate) Lying  Pulse Rate (!) 109  Pulse Rate Source Monitor  ECG Heart Rate (!) 110  Resp 16  Oxygen Therapy  SpO2 98 %  O2 Device Room Air  During Treatment Monitoring  Blood Flow Rate (mL/min) 199 mL/min  Arterial Pressure (mmHg) -78.38 mmHg  Venous Pressure (mmHg) 55.35 mmHg  TMP (mmHg) 7.27 mmHg  Ultrafiltration Rate (mL/min) 306 mL/min  Dialysate Flow Rate (mL/min) 299 ml/min  Dialysate Potassium Concentration 3  Dialysate Calcium Concentration 2.5  Duration of HD Treatment -hour(s) 2.5 hour(s)  Cumulative Fluid Removed (mL) per Treatment  850.05  HD Safety Checks Performed Yes  Intra-Hemodialysis Comments Tx completed  Post Treatment  Dialyzer Clearance Lightly streaked  Liters Processed 37.5  Fluid Removed (mL) 900 mL  Tolerated HD Treatment Yes  Post-Hemodialysis Comments PT stable  Hemodialysis Catheter Right Internal jugular Double lumen Permanent (Tunneled)  Placement Date/Time: 05/17/23 1323   Serial / Lot #: [id number]  Expiration Date: 11/18/27  Time Out: Correct patient;Correct site;Correct procedure  Maximum sterile barrier precautions: Hand hygiene;Cap;Mask;Sterile gown;Sterile gloves;Large sterile s...  Site Condition No complications  Blue Lumen Status Heparin locked  Red Lumen Status Heparin locked  Catheter fill solution Heparin 1000 units/ml  Catheter fill volume (Arterial) 1.9 cc  Catheter fill volume (Venous) 1.9  Dressing Type Transparent;Gauze/Drain sponge  Dressing Status Antimicrobial disc in place;Clean, Dry, Intact  Drainage Description None  Post treatment catheter status Capped and Clamped   Due to sudden decrease in blood pressure during dialysis, fluid removal goal reduced. Net UF . Pt stable post treatment. Hand off to [facility] RN.

## 2023-05-19 NOTE — Plan of Care (Signed)
  Problem: Education: Goal: Knowledge of General Education information will improve Description: Including pain rating scale, medication(s)/side effects and non-pharmacologic comfort measures Outcome: Progressing   Problem: Health Behavior/Discharge Planning: Goal: Ability to manage health-related needs will improve Outcome: Progressing   Problem: Clinical Measurements: Goal: Ability to maintain clinical measurements within normal limits will improve Outcome: Progressing Goal: Will remain free from infection Outcome: Progressing Goal: Diagnostic test results will improve Outcome: Progressing Goal: Respiratory complications will improve Outcome: Progressing Goal: Cardiovascular complication will be avoided Outcome: Progressing   Problem: Cardiovascular: Goal: Ability to achieve and maintain adequate cardiovascular perfusion will improve Outcome: Progressing Goal: Vascular access site(s) Level 0-1 will be maintained Outcome: Progressing

## 2023-05-19 NOTE — Progress Notes (Signed)
Pt had 1 episode of vomiting. States he was trying to eat dinner and thinks that's what caused it. Zofran given. Pts brushed teeth. Pt states he is feeling better.

## 2023-05-19 NOTE — Progress Notes (Signed)
Spoke to [facility] admissions this morning. Once pt's Hep B total core antibody is received then pt's referral will be reviewed by clinic. Pt's Hep B total core result faxed to admissions this morning. Will await determination and schedule. Will assist as needed.   Olivia Canter Renal Navigator [phone]

## 2023-05-19 NOTE — Progress Notes (Signed)
[facility] NEPHROLOGY PROGRESS NOTE  Assessment/ Plan: Pt is a 37 y.o. 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 Status post tunneled HD catheter placed by IR on 8/27 and received first HD and just completed second HD this morning.  We will plan for another dialysis tomorrow. Renal navigator is  arranging outpatient dialysis. Seen by vascular surgeon for permanent access, not stable for surgery at this time.  # NSTEMI: Seen by cardiologist and cath showed nonischemic cardiomyopathy.  Continue current cardiac medications.  # Acute on chronic systolic CHF/cardiorenal syndrome: EF of 25%.   Volume management with dialysis.  # Scrotal abscess: Seen by surgeon, no surgical intervention, getting Zosyn.  # Anemia of CKD: Hemoglobin low, iron saturation 35 with low ferritin level.  Ordered IV iron.  # HTN/volume: Continue cardiac medication and volume management dialysis.  Continue cardiac medication.  # CKD-MBD, hyperphosphatemia: Started Auryxia with meals.  Pending PTH level.  # Hypokalemia: Unsure if it was done immediately after dialysis.  Follow lab. Discussed with ICU team.  Subjective: Seen and examined at bedside.  He feels much better.  Denies nausea, vomiting, chest pain, shortness of breath.  Completed second dialysis earlier this morning.  Objective Vital signs in last 24 hours: Vitals:   05/19/23 0545 05/19/23 0600 05/19/23 0700 05/19/23 0915  BP: 107/73 103/74 118/73 133/80  Pulse: (!) 103 (!) 101 (!) 103   Resp: (!) 0 (!) 0 18   Temp:      TempSrc:      SpO2: 99% 99% 99%   Weight:      Height:       Weight change: 5.7 kg  Intake/Output Summary (Last 24 hours) at 05/19/2023 0941 Last data filed at 05/19/2023 0600 Gross per 24 hour  Intake 1439 ml  Output 2300 ml   Net -861 ml       Labs: RENAL PANEL Recent Labs  Lab 05/17/23 0506 05/17/23 0802 05/17/23 1201 05/18/23 0632 05/18/23 0906 05/18/23 0907 05/18/23 0910 05/18/23 1741 05/19/23 0209  NA 137  --  138 137 137 138 138 136 134*  K 4.0  --  4.3 3.7 3.8 3.7 3.7 3.8 3.3*  CL 106  --  107 101  --   --   --  102 97*  CO2 11*  --  12* 17*  --   --   --  17* 22  GLUCOSE 100*  --  86 85  --   --   --  115* 89  BUN 147*  --  150* 113*  --   --   --  115* 71*  CREATININE 21.61*  --  21.88* 16.96*  --   --   --  17.42* 12.21*  CALCIUM 7.6*  --  7.6* 7.8*  --   --   --  7.9* 7.9*  MG  --  1.6* 2.0 1.8  --   --   --   --  2.1  PHOS  --  10.5* 10.2* 7.1*  7.0*  --   --   --  7.7* 5.0*  ALBUMIN 3.6  --  3.3* 2.8*  --   --   --  2.7* 2.7*    Liver Function Tests: Recent Labs  Lab 05/17/23 0506 05/17/23 1201 05/18/23 4098 05/18/23 1741  05/19/23 0209  AST 21 23  --   --   --   ALT 14 15  --   --   --   ALKPHOS 56 57  --   --   --   BILITOT 0.4 0.4  --   --   --   PROT 7.0 7.0  --   --   --   ALBUMIN 3.6 3.3* 2.8* 2.7* 2.7*   Recent Labs  Lab 05/17/23 0506  LIPASE 116*   No results for input(s): "AMMONIA" in the last 168 hours. CBC: Recent Labs    05/17/23 0802 05/17/23 1201 05/18/23 0632 05/18/23 0906 05/18/23 0907 05/18/23 0910 05/19/23 0209  HGB  --    < > 8.4* 8.8* 8.5* 8.5* 8.3*  MCV  --    < > 87.9  --   --   --  88.0  FERRITIN 47  --   --   --   --   --   --   TIBC 156*  --   --   --   --   --   --   IRON 55  --   --   --   --   --   --    < > = values in this interval not displayed.    Cardiac Enzymes: No results for input(s): "CKTOTAL", "CKMB", "CKMBINDEX", "TROPONINI" in the last 168 hours. CBG: Recent Labs  Lab 05/18/23 1133 05/18/23 1610 05/18/23 2013 05/19/23 0448 05/19/23 0802  GLUCAP 78 117* 91 97 79    Iron Studies:  Recent Labs    05/17/23 0802  IRON 55  TIBC 156*  FERRITIN 47   Studies/Results: VAS [place] UPPER EXT VEIN MAPPING  (PRE-OP AVF)  Result Date: 05/18/2023 UPPER EXTREMITY VEIN MAPPING Patient Name:  JULEON BOSLEY  Date of Exam:   05/18/2023 Medical Rec #: [id number]     Accession #:    [id number] Date of Birth: 1985-10-05      Patient Gender: M Patient Age:   58 years Exam Location:  [facility] Procedure:      VAS [place] UPPER EXT VEIN MAPPING (PRE-OP AVF) Referring Phys: Crista Elliot --------------------------------------------------------------------------------  Indications: Pre-access. History: CKD Stage 4.  Performing Technologist: Shona Simpson  Examination Guidelines: A complete evaluation includes B-mode imaging, spectral Doppler, color Doppler, and power Doppler as needed of all accessible portions of each vessel. Bilateral testing is considered an integral part of a complete examination. Limited examinations for reoccurring indications may be performed as noted. +-----------------+-------------+----------+---------+ Right Cephalic   Diameter (cm)Depth (cm)Findings  +-----------------+-------------+----------+---------+ Shoulder             0.26        0.78             +-----------------+-------------+----------+---------+ Prox upper arm       0.39        0.58             +-----------------+-------------+----------+---------+ Mid upper arm        0.31        0.23             +-----------------+-------------+----------+---------+ Dist upper arm       0.23        0.37             +-----------------+-------------+----------+---------+ Antecubital fossa    0.25        0.23             +-----------------+-------------+----------+---------+  Prox forearm         0.34        0.48             +-----------------+-------------+----------+---------+ Mid forearm          0.31        0.39   branching +-----------------+-------------+----------+---------+ Dist forearm         0.27        0.24             +-----------------+-------------+----------+---------+ Wrist                0.27         0.21             +-----------------+-------------+----------+---------+ +-----------------+-------------+----------+--------------+ Right Basilic    Diameter (cm)Depth (cm)   Findings    +-----------------+-------------+----------+--------------+ Shoulder                                not visualized +-----------------+-------------+----------+--------------+ Prox upper arm       0.40        1.44                  +-----------------+-------------+----------+--------------+ Mid upper arm        0.51        1.33                  +-----------------+-------------+----------+--------------+ Dist upper arm       0.55        1.00                  +-----------------+-------------+----------+--------------+ Antecubital fossa    0.58        0.46                  +-----------------+-------------+----------+--------------+ Prox forearm         0.37        0.30     branching    +-----------------+-------------+----------+--------------+ Mid forearm                             not visualized +-----------------+-------------+----------+--------------+ Distal forearm                          not visualized +-----------------+-------------+----------+--------------+ Wrist                                   not visualized +-----------------+-------------+----------+--------------+ +-----------------+-------------+----------+--------------+ Left Cephalic    Diameter (cm)Depth (cm)   Findings    +-----------------+-------------+----------+--------------+ Shoulder             0.29        0.67                  +-----------------+-------------+----------+--------------+ Prox upper arm       0.26        0.55                  +-----------------+-------------+----------+--------------+ Mid upper arm        0.27        0.52                  +-----------------+-------------+----------+--------------+ Dist upper arm       0.21        0.32                   +-----------------+-------------+----------+--------------+  Antecubital fossa    0.21        0.19                  +-----------------+-------------+----------+--------------+ Prox forearm         0.24        0.48                  +-----------------+-------------+----------+--------------+ Mid forearm          0.24        0.42   not visualized +-----------------+-------------+----------+--------------+ Dist forearm         0.46        0.22                  +-----------------+-------------+----------+--------------+ Wrist                0.15        0.19                  +-----------------+-------------+----------+--------------+ +-----------------+-------------+----------+--------------+ Left Basilic     Diameter (cm)Depth (cm)   Findings    +-----------------+-------------+----------+--------------+ Shoulder                                not visualized +-----------------+-------------+----------+--------------+ Prox upper arm       0.47        1.04                  +-----------------+-------------+----------+--------------+ Mid upper arm        0.41        1.03                  +-----------------+-------------+----------+--------------+ Dist upper arm       0.43        0.92                  +-----------------+-------------+----------+--------------+ Antecubital fossa    0.43        0.65     branching    +-----------------+-------------+----------+--------------+ Prox forearm         0.25        0.34                  +-----------------+-------------+----------+--------------+ Mid forearm          0.19        0.20                  +-----------------+-------------+----------+--------------+ Distal forearm       0.15        0.20     branching    +-----------------+-------------+----------+--------------+ Wrist                0.12        0.16                  +-----------------+-------------+----------+--------------+ *See table(s) above for  measurements and observations.  Diagnosing physician: Sherald Hess MD Electronically signed by Sherald Hess MD on 05/18/2023 at 4:19:09 PM.    Final    IR Fluoro Guide CV Line Right  Result Date: 05/18/2023 INDICATION: ESRD requiring HD. Pulmonary edema. Tunnelled HD cath placement, sooner rather then later per nephro if possible EXAM: TUNNELED CENTRAL VENOUS HEMODIALYSIS CATHETER PLACEMENT WITH ULTRASOUND AND FLUOROSCOPIC GUIDANCE MEDICATIONS: None. ANESTHESIA/SEDATION: Moderate (conscious) sedation was employed during this procedure. A total of Versed 1 mg and Fentanyl 50 mcg was administered intravenously. Moderate Sedation  Time: 18 minutes. The patient's level of consciousness and vital signs were monitored continuously by radiology nursing throughout the procedure under my direct supervision. FLUOROSCOPY TIME:  Fluoroscopic dose; 4 mGy COMPLICATIONS: None immediate. PROCEDURE: Informed written consent was obtained from the patient and/or patient's representative after a discussion of the risks, benefits, and alternatives to treatment. Questions regarding the procedure were encouraged and answered. The RIGHT neck and chest were prepped with chlorhexidine in a sterile fashion, and a sterile drape was applied covering the operative field. Maximum barrier sterile technique with sterile gowns and gloves were used for the procedure. A timeout was performed prior to the initiation of the procedure. After creating a small venotomy incision, a micropuncture kit was utilized to access the internal jugular vein. Real-time ultrasound guidance was utilized for vascular access including the acquisition of a permanent ultrasound image documenting patency of the accessed vessel. The microwire was utilized to measure appropriate catheter length. A stiff Glidewire was advanced to the level of the IVC and the micropuncture sheath was exchanged for a peel-away sheath. A palindrome tunneled hemodialysis catheter  measuring 23 cm from tip to cuff was tunneled in a retrograde fashion from the anterior chest wall to the venotomy incision. The catheter was then placed through the peel-away sheath with tips ultimately positioned within the superior aspect of the right atrium. Final catheter positioning was confirmed and documented with a spot radiographic image. The catheter aspirates and flushes normally. The catheter was flushed with appropriate volume heparin dwells. The catheter exit site was secured with a 0-Prolene retention suture. The venotomy incision was closed with Dermabond. Dressings were applied. The patient tolerated the procedure well without immediate post procedural complication. IMPRESSION: Successful placement of 23 cm tip to cuff tunneled hemodialysis catheter via the RIGHT internal jugular vein The tip of the catheter is positioned within the proximal RIGHT atrium. The catheter is ready for immediate use. [name], MD [facility] Radiology Electronically Signed   By: [name] M.D.   On: 05/18/2023 10:16   IR US Guide Vasc Access Right  Result Date: 05/18/2023 INDICATION: ESRD requiring HD. Pulmonary edema. Tunnelled HD cath placement, sooner rather then later per nephro if possible EXAM: TUNNELED CENTRAL VENOUS HEMODIALYSIS CATHETER PLACEMENT WITH ULTRASOUND AND FLUOROSCOPIC GUIDANCE MEDICATIONS: None. ANESTHESIA/SEDATION: Moderate (conscious) sedation was employed during this procedure. A total of Versed 1 mg and Fentanyl 50 mcg was administered intravenously. Moderate Sedation Time: 18 minutes. The patient's level of consciousness and vital signs were monitored continuously by radiology nursing throughout the procedure under my direct supervision. FLUOROSCOPY TIME:  Fluoroscopic dose; 4 mGy COMPLICATIONS: None immediate. PROCEDURE: Informed written consent was obtained from the patient and/or patient's representative after a discussion of the  risks, benefits, and alternatives to treatment. Questions regarding the procedure were encouraged and answered. The RIGHT neck and chest were prepped with chlorhexidine in a sterile fashion, and a sterile drape was applied covering the operative field. Maximum barrier sterile technique with sterile gowns and gloves were used for the procedure. A timeout was performed prior to the initiation of the procedure. After creating a small venotomy incision, a micropuncture kit was utilized to access the internal jugular vein. Real-time ultrasound guidance was utilized for vascular access including the acquisition of a permanent ultrasound image documenting patency of the accessed vessel. The microwire was utilized to measure appropriate catheter length. A stiff Glidewire was advanced to the level of the IVC and the micropuncture sheath was exchanged for a  peel-away sheath. A palindrome tunneled hemodialysis catheter measuring 23 cm from tip to cuff was tunneled in a retrograde fashion from the anterior chest wall to the venotomy incision. The catheter was then placed through the peel-away sheath with tips ultimately positioned within the superior aspect of the right atrium. Final catheter positioning was confirmed and documented with a spot radiographic image. The catheter aspirates and flushes normally. The catheter was flushed with appropriate volume heparin dwells. The catheter exit site was secured with a 0-Prolene retention suture. The venotomy incision was closed with Dermabond. Dressings were applied. The patient tolerated the procedure well without immediate post procedural complication. IMPRESSION: Successful placement of 23 cm tip to cuff tunneled hemodialysis catheter via the RIGHT internal jugular vein The tip of the catheter is positioned within the proximal RIGHT atrium. The catheter is ready for immediate use. [name], MD [facility] Radiology  Electronically Signed   By: [name] M.D.   On: 05/18/2023 10:16   CARDIAC CATHETERIZATION  Addendum Date: 05/18/2023   Left and right heart catheterization 05/18/2023: Right dominant circulation Calcified and ectatic vessels with minimal irregularities No obstructive coronary artery disease RA: 5 mmHg RV: 39/5 mmHg PA: 29/19 mmHg, mPAP 23 mmHg PCW: 17 mmHg LV: 139/1 mmHg, LVEDP 29 mmHg CO: 9.4 L/min CI: 4.3 L/min/m2 Nonischemic cardiomyopathy Mild decompensation Borderline pulmonary hypertension (WHO Grp II) [name], MD Pager: [phone] Office: [phone]   Result Date: 05/18/2023 Images from the original result were not included. Left and right heart catheterization 05/18/2023: Right dominant circulation Calcified and ectatic vessels with minimal irregularities No obstructive coronary artery disease RA: 5 mmHg RV: 39/5 mmHg PA: 29/19 mmHg, mPAP 23 mmHg PCW: 17 mmHg LV: 139/1 mmHg, LVEDP 29 mmHg CO: 9.4 L/min CI: 4.3 L/min/m2 [name], MD Pager: [phone] Office: [phone]  DG Chest Port 1 View  Result Date: 05/18/2023 CLINICAL DATA:  Pulmonary edema. EXAM: PORTABLE CHEST 1 VIEW COMPARISON:  May 17, 2023. FINDINGS: Stable cardiomediastinal silhouette. Right internal jugular catheter is noted with tip in expected position of cavoatrial junction. Both lungs are clear. Subacute to old left clavicular fracture is noted. IMPRESSION: No active disease. Electronically Signed   By: Lupita Raider M.D.   On: 05/18/2023 07:50   ECHOCARDIOGRAM COMPLETE  Result Date: 05/17/2023    ECHOCARDIOGRAM REPORT   Patient Name:   KEONNE COPPING [facility] Date of Exam: 05/17/2023 Medical Rec #:  [id number]    Height:       71.0 in Accession #:    [id number]   Weight:       220.0 lb Date of Birth:  03-Apr-1986     BSA:          2.196 m Patient Age:    37 years     BP:           122/104 mmHg Patient Gender: M            HR:           120 bpm. Exam Location:  Inpatient Procedure: 2D Echo, Cardiac  Doppler, Color Doppler and Intracardiac            Opacification Agent Indications:    CHF  History:        Patient has prior history of Echocardiogram examinations, most                 recent 06/06/2018. Cardiomyopathy, Stroke; Risk Factors:Obesity,  Dyslipidemia and Hypertension. CKD.  Sonographer:    [name] Referring Phys: [id number] JASON MESNER IMPRESSIONS  1. Left ventricular ejection fraction, by estimation, is 25%. The left ventricle has severely decreased function. The left ventricle demonstrates regional wall motion abnormalities (see scoring diagram/findings for description). The left ventricular internal cavity size was mildly dilated. There is moderate concentric left ventricular hypertrophy. Left ventricular diastolic parameters are indeterminate. Largely hypokinetic save for the apex.  2. Right ventricular systolic function is hyperdynamic. The right ventricular size is normal.  3. Left atrial size was severely dilated.  4. The mitral valve is grossly normal. Mild mitral valve regurgitation. No evidence of mitral stenosis.  5. The aortic valve was not well visualized. There is mild thickening of the aortic valve. Aortic valve regurgitation is not visualized.  6. The inferior vena cava is normal in size with greater than 50% respiratory variability, suggesting right atrial pressure of 3 mmHg. Comparison(s): Prior images reviewed side by side. Compared to 2019 report, LVEF is similar. Elevated heart rates and narrow pulse pressure were not reported at prior reports. Conclusion(s)/Recommendation(s): Will reach out to primary team. FINDINGS  Left Ventricle: Left ventricular ejection fraction, by estimation, is 25%. The left ventricle has severely decreased function. The left ventricle demonstrates regional wall motion abnormalities. Definity contrast agent was given IV to delineate the left  ventricular endocardial borders. The left ventricular internal cavity size was mildly dilated.  There is moderate concentric left ventricular hypertrophy. Left ventricular diastolic parameters are indeterminate.  LV Wall Scoring: The anterior wall, antero-lateral wall, anterior septum, inferior wall, posterior wall, mid inferoseptal segment, and basal inferoseptal segment are hypokinetic. Largely hypokinetic save for the apex. Right Ventricle: The right ventricular size is normal. No increase in right ventricular wall thickness. Right ventricular systolic function is hyperdynamic. Left Atrium: Left atrial size was severely dilated. Right Atrium: Right atrial size was normal in size. Pericardium: Trivial pericardial effusion is present. The pericardial effusion is circumferential. Mitral Valve: The mitral valve is grossly normal. Mild mitral valve regurgitation. No evidence of mitral valve stenosis. Tricuspid Valve: The tricuspid valve is normal in structure. Tricuspid valve regurgitation is not demonstrated. No evidence of tricuspid stenosis. Aortic Valve: The aortic valve was not well visualized. There is mild thickening of the aortic valve. Aortic valve regurgitation is not visualized. Pulmonic Valve: The pulmonic valve was normal in structure. Pulmonic valve regurgitation is trivial. No evidence of pulmonic stenosis. Aorta: The aortic root, ascending aorta and aortic arch are all structurally normal, with no evidence of dilitation or obstruction. Venous: The inferior vena cava is normal in size with greater than 50% respiratory variability, suggesting right atrial pressure of 3 mmHg. IAS/Shunts: The interatrial septum was not well visualized.  LEFT VENTRICLE PLAX 2D LVIDd:         6.10 cm   Diastology LVIDs:         5.25 cm   LV e' medial:  10.09 cm/s LV PW:         1.40 cm   LV e' lateral: 8.16 cm/s LV IVS:        1.30 cm LVOT diam:     2.20 cm LVOT Area:     3.80 cm  RIGHT VENTRICLE             IVC RV S prime:     12.30 cm/s  IVC diam: 1.70 cm LEFT ATRIUM              Index  RIGHT ATRIUM            Index LA diam:        4.80 cm  2.19 cm/m   RA Area:     14.60 cm LA Vol (A2C):   124.0 ml 56.47 ml/m  RA Volume:   34.50 ml  15.71 ml/m LA Vol (A4C):   113.0 ml 51.46 ml/m LA Biplane Vol: 123.0 ml 56.02 ml/m   AORTA Ao Root diam: 3.40 cm Ao Asc diam:  3.40 cm MR Peak grad: 55.1 mmHg MR Vmax:      371.00 cm/s SHUNTS                           Systemic Diam: 2.20 cm Riley Lam MD Electronically signed by Riley Lam MD Signature Date/Time: 05/17/2023/4:22:43 PM    Final    CT PELVIS W CONTRAST  Result Date: 05/17/2023 CLINICAL DATA:  Scrotal swelling or edema. Scrotal abscess. Evaluate for Fournier's gangrene. EXAM: CT PELVIS WITH CONTRAST TECHNIQUE: Multidetector CT imaging of the pelvis was performed using the standard protocol following the bolus administration of intravenous contrast. RADIATION DOSE REDUCTION: This exam was performed according to the departmental dose-optimization program which includes automated exposure control, adjustment of the mA and/or kV according to patient size and/or use of iterative reconstruction technique. CONTRAST:  75mL OMNIPAQUE IOHEXOL 350 MG/ML SOLN COMPARISON:  Abdominopelvic CT 07/19/2016. FINDINGS: Urinary Tract: The visualized distal ureters and bladder appear unremarkable. Bowel: No bowel wall thickening, distention or surrounding inflammation identified within the pelvis. No enteric contrast administered. Vascular/Lymphatic: No enlarged pelvic lymph nodes identified. There are small external iliac and inguinal lymph nodes bilaterally, similar to previous study, likely reactive. Minimal iliac atherosclerosis without evidence of aneurysm. Reproductive: The prostate gland and seminal vesicles appear unremarkable. The visualized penis appears unremarkable. The scrotum is incompletely visualized by this examination. Other: No focal pelvic inflammatory changes or soft tissue emphysema identified. As above, the scrotum and perineum are incompletely  visualized. Musculoskeletal: No acute osseous findings. No evidence of femoral head osteonecrosis or acute fracture. Suspected chronic sacroiliitis bilaterally. IMPRESSION: 1. No focal pelvic inflammatory changes or soft tissue emphysema identified. 2. The scrotum and perineum are incompletely visualized by this examination. Consider scrotal ultrasound for further evaluation. Consider urology consult. 3. Suspected chronic sacroiliitis bilaterally. Electronically Signed   By: Carey Bullocks M.D.   On: 05/17/2023 15:51    Medications: Infusions:  sodium chloride Stopped (05/18/23 0733)   sodium chloride     ferric gluconate (FERRLECIT) IVPB Stopped (05/18/23 1355)   nitroGLYCERIN Stopped (05/19/23 0354)   piperacillin-tazobactam (ZOSYN)  IV Stopped (05/19/23 0241)    Scheduled Medications:  ascorbic acid  250 mg Oral BID   aspirin  81 mg Oral Daily   atorvastatin  20 mg Oral Daily   Chlorhexidine Gluconate Cloth  6 each Topical Q0600   Chlorhexidine Gluconate Cloth  6 each Topical Q0600   ferric citrate  420 mg Oral TID WC   heparin  5,000 Units Subcutaneous Q8H   hydrALAZINE  50 mg Oral TID   insulin aspart  0-9 Units Subcutaneous Q4H   isosorbide mononitrate  60 mg Oral Daily   multivitamin  1 tablet Oral QHS   sodium chloride flush  3 mL Intravenous Q12H   thiamine  100 mg Oral Daily    have reviewed scheduled and prn medications.  Physical Exam: General:NAD, comfortable Heart:RRR, s1s2 nl Lungs: Clear b/l, no crackle Abdomen:soft, Non-tender, non-distended  Extremities:No edema Dialysis Access: Right IJ TDC in place.  Jaydah Stahle Prasad Matsue Strom 05/19/2023,9:41 AM  LOS: 2 days

## 2023-05-19 NOTE — Progress Notes (Signed)
Heart Failure Nurse Navigator Progress Note  PCP: [name], NP PCP-Cardiologist: none Admission Diagnosis: Acute renal failure superimposed on stage 5, elevated troponin, elevated BNP, acidosis, scrotal abscess, hypertension. Admitted from: Home  Presentation:   Ramond Dial presented with shortness of breath and emesis x 4 weeks  ECHO/ LVEF: 20-25%  Clinical Course:  Past Medical History:  Diagnosis Date   Arthritis    CKD stage 4 secondary to hypertension (HCC) 03/28/2017   Elevated blood uric acid level    Essential hypertension    Folliculitis    Managed with doxycycline, topical clindamycin/benzoyl peroxide   GERD (gastroesophageal reflux disease)    Gout    HFrEF (heart failure with reduced ejection fraction) (HCC)    Echo 2019 with EF 30-35%, hypokinesis   History of cardiomyopathy    LVEF 15% in 2010 - evaluated by [facility] and Dr. Graciela Husbands   History of stroke 2010   Right MCA distribution infarct, felt to be possibly embolic in the setting of cardiomyopathy - placed on Coumadin at that time   Hyperlipidemia    Nonischemic cardiomyopathy (HCC) 03/28/2017   Obesity    Prediabetes 05/03/2017     Social History   Socioeconomic History   Marital status: Single    Spouse name: Not on file   Number of children: Not on file   Years of education: Not on file   Highest education level: Associate degree: occupational, Scientist, product/process development, or vocational program  Occupational History   Not on file  Tobacco Use   Smoking status: Never   Smokeless tobacco: Never  Vaping Use   Vaping status: Never Used  Substance and Sexual Activity   Alcohol use: No    Alcohol/week: 0.0 standard drinks of alcohol   Drug use: No   Sexual activity: Not Currently  Other Topics Concern   Not on file  Social History Narrative   Not on file   Social Determinants of Health   Financial Resource Strain: Low Risk  (01/13/2023)   Overall Financial Resource Strain (CARDIA)    Difficulty of Paying  Living Expenses: Not hard at all  Food Insecurity: No Food Insecurity (01/13/2023)   Hunger Vital Sign    Worried About Running Out of Food in the Last Year: Never true    Ran Out of Food in the Last Year: Never true  Transportation Needs: No Transportation Needs (01/13/2023)   PRAPARE - Administrator, Civil Service (Medical): No    Lack of Transportation (Non-Medical): No  Physical Activity: Unknown (01/13/2023)   Exercise Vital Sign    Days of Exercise per Week: 0 days    Minutes of Exercise per Session: Not on file  Stress: Stress Concern Present (01/13/2023)   [employer] of Occupational Health - Occupational Stress Questionnaire    Feeling of Stress : To some extent  Social Connections: Moderately Integrated (01/13/2023)   Social Connection and Isolation Panel [NHANES]    Frequency of Communication with Friends and Family: More than three times a week    Frequency of Social Gatherings with Friends and Family: Twice a week    Attends Religious Services: More than 4 times per year    Active Member of [employer] or Organizations: Yes    Attends Engineer, structural: More than 4 times per year    Marital Status: Never married    Education Assessment and Provision:  Detailed education and instructions provided on heart failure disease management including the following:  Signs and  symptoms of Heart Failure When to call the physician Importance of daily weights Low sodium diet Fluid restriction Medication management Anticipated future follow-up appointments  Patient education given on each of the above topics.  Patient acknowledges understanding via teach back method and acceptance of all instructions.  Education Materials:  "Living Better With Heart Failure" Booklet, HF zone tool, & Daily Weight Tracker Tool.  Patient has scale at home: yes Patient has pill box at home: yes    High Risk Criteria for Readmission and/or Poor Patient Outcomes: Heart  failure hospital admissions (last 6 months): 0  No Show rate: 22% Difficult social situation: no Demonstrates medication adherence:  Primary Language: Englidh Literacy level: comprehension and writing   Barriers of Care:   Diet and fluid restriction  Considerations/Referrals:   Referral made to Heart Failure Pharmacist Stewardship: Yes Referral made to Heart Failure CSW/NCM TOC: no Referral made to Heart & Vascular TOC clinic: Yes 9/9  Items for Follow-up on DC/TOC: Diet and fluid restriction   Elon Lomeli,RN, BSN,MSN Heart Failure Nurse Navigator. Contact by secure chat only.

## 2023-05-19 NOTE — Plan of Care (Signed)

## 2023-05-20 DIAGNOSIS — I214 Non-ST elevation (NSTEMI) myocardial infarction: Secondary | ICD-10-CM

## 2023-05-20 LAB — CBC
HCT: 25.4 % — ABNORMAL LOW (ref 39.0–52.0)
HCT: 24.7 % — ABNORMAL LOW (ref 39.0–52.0)
Hemoglobin: 8.4 g/dL — ABNORMAL LOW (ref 13.0–17.0)
Hemoglobin: 8.3 g/dL — ABNORMAL LOW (ref 13.0–17.0)
MCH: 30 pg (ref 26.0–34.0)
MCH: 30.2 pg (ref 26.0–34.0)
MCHC: 33.1 g/dL (ref 30.0–36.0)
MCHC: 33.6 g/dL (ref 30.0–36.0)
MCV: 90.7 fL (ref 80.0–100.0)
MCV: 89.8 fL (ref 80.0–100.0)
Platelets: 196 10*3/uL (ref 150–400)
Platelets: 192 10*3/uL (ref 150–400)
RBC: 2.8 MIL/uL — ABNORMAL LOW (ref 4.22–5.81)
RBC: 2.75 MIL/uL — ABNORMAL LOW (ref 4.22–5.81)
RDW: 14.2 % (ref 11.5–15.5)
RDW: 14.6 % (ref 11.5–15.5)
WBC: 14.2 10*3/uL — ABNORMAL HIGH (ref 4.0–10.5)
WBC: 14 10*3/uL — ABNORMAL HIGH (ref 4.0–10.5)
nRBC: 0.2 % (ref 0.0–0.2)
nRBC: 0.1 % (ref 0.0–0.2)

## 2023-05-20 LAB — RENAL FUNCTION PANEL
Albumin: 2.7 g/dL — ABNORMAL LOW (ref 3.5–5.0)
Anion gap: 15 (ref 5–15)
BUN: 83 mg/dL — ABNORMAL HIGH (ref 6–20)
CO2: 21 mmol/L — ABNORMAL LOW (ref 22–32)
Calcium: 8.3 mg/dL — ABNORMAL LOW (ref 8.9–10.3)
Chloride: 101 mmol/L (ref 98–111)
Creatinine, Ser: 15.09 mg/dL — ABNORMAL HIGH (ref 0.61–1.24)
GFR, Estimated: 4 mL/min — ABNORMAL LOW (ref >60)
Glucose, Bld: 101 mg/dL — ABNORMAL HIGH (ref 70–99)
Phosphorus: 6.9 mg/dL — ABNORMAL HIGH (ref 2.5–4.6)
Potassium: 3.3 mmol/L — ABNORMAL LOW (ref 3.5–5.1)
Sodium: 137 mmol/L (ref 135–145)

## 2023-05-20 LAB — GLUCOSE, CAPILLARY
Glucose-Capillary: 70 mg/dL (ref 70–99)
Glucose-Capillary: 73 mg/dL (ref 70–99)
Glucose-Capillary: 83 mg/dL (ref 70–99)
Glucose-Capillary: 98 mg/dL (ref 70–99)

## 2023-05-20 LAB — MAGNESIUM: Magnesium: 2.3 mg/dL (ref 1.7–2.4)

## 2023-05-20 LAB — LIPASE, BLOOD: Lipase: 90 U/L — ABNORMAL HIGH (ref 11–51)

## 2023-05-20 LAB — AMYLASE: Amylase: 248 U/L — ABNORMAL HIGH (ref 28–100)

## 2023-05-20 MED ORDER — CHLORHEXIDINE GLUCONATE CLOTH 2 % EX PADS
6.0000 | MEDICATED_PAD | Freq: Every day | CUTANEOUS | Status: DC
  Administered 2023-05-21: 6 via TOPICAL

## 2023-05-20 MED ORDER — PENTAFLUOROPROP-TETRAFLUOROETH EX AERO: 1.0000 | INHALATION_SPRAY | CUTANEOUS | Status: DC | PRN

## 2023-05-20 MED ORDER — CARMEX CLASSIC LIP BALM EX OINT
TOPICAL_OINTMENT | CUTANEOUS | Status: DC | PRN
  Filled 2023-05-20: qty 10

## 2023-05-20 MED ORDER — HEPARIN SODIUM (PORCINE) 1000 UNIT/ML DIALYSIS: 1000.0000 [IU] | INTRAMUSCULAR | Status: DC | PRN

## 2023-05-20 MED ORDER — ANTICOAGULANT SODIUM CITRATE 4% (200MG/5ML) IV SOLN: 5.0000 mL | Status: DC | PRN

## 2023-05-20 MED ORDER — DARBEPOETIN ALFA 100 MCG/0.5ML IJ SOSY
100.0000 ug | PREFILLED_SYRINGE | INTRAMUSCULAR | Status: DC
  Administered 2023-05-20: 100 ug via SUBCUTANEOUS
  Filled 2023-05-20: qty 0.5

## 2023-05-20 MED ORDER — SACUBITRIL-VALSARTAN 24-26 MG PO TABS
1.0000 | ORAL_TABLET | Freq: Two times a day (BID) | ORAL | Status: DC
  Administered 2023-05-20 – 2023-05-21 (×3): 1 via ORAL
  Filled 2023-05-20 (×3): qty 1

## 2023-05-20 MED ORDER — LIDOCAINE-PRILOCAINE 2.5-2.5 % EX CREA: 1.0000 | TOPICAL_CREAM | CUTANEOUS | Status: DC | PRN

## 2023-05-20 MED ORDER — CARVEDILOL 12.5 MG PO TABS
12.5000 mg | ORAL_TABLET | Freq: Two times a day (BID) | ORAL | Status: DC
  Administered 2023-05-20 – 2023-05-21 (×3): 12.5 mg via ORAL
  Filled 2023-05-20 (×3): qty 1

## 2023-05-20 MED ORDER — ALTEPLASE 2 MG IJ SOLR: 2.0000 mg | Freq: Once | INTRAMUSCULAR | Status: DC | PRN

## 2023-05-20 MED ORDER — LOSARTAN POTASSIUM 25 MG PO TABS: 25.0000 mg | ORAL_TABLET | Freq: Every day | ORAL | Status: DC

## 2023-05-20 MED ORDER — LIDOCAINE HCL (PF) 1 % IJ SOLN: 5.0000 mL | INTRAMUSCULAR | Status: DC | PRN

## 2023-05-20 MED ORDER — FAMOTIDINE 20 MG PO TABS
10.0000 mg | ORAL_TABLET | Freq: Two times a day (BID) | ORAL | Status: DC | PRN
  Administered 2023-05-20: 10 mg via ORAL
  Filled 2023-05-20: qty 1

## 2023-05-20 MED ORDER — HEPARIN SODIUM (PORCINE) 1000 UNIT/ML DIALYSIS
20.0000 [IU]/kg | INTRAMUSCULAR | Status: DC | PRN
  Administered 2023-05-20: 2100 [IU] via INTRAVENOUS_CENTRAL
  Filled 2023-05-20: qty 3

## 2023-05-20 NOTE — Plan of Care (Signed)

## 2023-05-20 NOTE — Progress Notes (Signed)
   05/20/23 1131  Vitals  Temp 97.9 F (36.6 C)  Pulse Rate (!) 104  Resp 19  BP (!) 144/81  SpO2 100 %  O2 Device Room Air  Weight 103.4 kg  Type of Weight Post-Dialysis  Oxygen Therapy  Patient Activity (if Appropriate) In bed  Pulse Oximetry Type Continuous  Oximetry Probe Site Changed No  Post Treatment  Dialyzer Clearance Lightly streaked  Hemodialysis Intake (mL) 0 mL  Liters Processed 63  Fluid Removed (mL) 2000 mL  Tolerated HD Treatment Yes     05/20/23 1131  Vitals  Temp 97.9 F (36.6 C)  Pulse Rate (!) 104  Resp 19  BP (!) 144/81  SpO2 100 %  O2 Device Room Air  Weight 103.4 kg  Type of Weight Post-Dialysis  Oxygen Therapy  Patient Activity (if Appropriate) In bed  Pulse Oximetry Type Continuous  Oximetry Probe Site Changed No  Post Treatment  Dialyzer Clearance Lightly streaked  Hemodialysis Intake (mL) 0 mL  Liters Processed 63  Fluid Removed (mL) 2000 mL  Tolerated HD Treatment Yes     05/20/23 1131  Vitals  Temp 97.9 F (36.6 C)  Pulse Rate (!) 104  Resp 19  BP (!) 144/81  SpO2 100 %  O2 Device Room Air  Weight 103.4 kg  Type of Weight Post-Dialysis  Oxygen Therapy  Patient Activity (if Appropriate) In bed  Pulse Oximetry Type Continuous  Oximetry Probe Site Changed No  Post Treatment  Dialyzer Clearance Lightly streaked  Hemodialysis Intake (mL) 0 mL  Liters Processed 63  Fluid Removed (mL) 2000 mL  Tolerated HD Treatment Yes   Received patient in bed to unit.  Alert and oriented.  Informed consent signed and in chart.   TX duration:3.0  Patient tolerated well.  Transported back to the room  Alert, without acute distress.  Hand-off given to patient's nurse.   Access used: [facility] Access issues: no complications  Total UF removed: 2000 Medication(s) given: none   [facility]

## 2023-05-20 NOTE — Progress Notes (Signed)
Pt has been accepted at [facility] on TTS 10:00 chair time. Pt can start on Tuesday (9/3) and will need to arrive at 9:15 to complete paperwork prior to treatment. Met with pt at bedside to discuss above details. Information sheet provided to pt with clinic details noted. Pt agreeable to plan. Update provided to attending, nephrologist, pt's RN, and RN CM. Arrangements added to AVS as well. Will assist as needed.   Olivia Canter Renal Navigator [phone]

## 2023-05-20 NOTE — Progress Notes (Signed)
Zachary Lynch  ZHY:[id number] DOB: 1986/04/12 DOA: 05/17/2023 PCP: [name], NP    Brief Narrative:  37 year old with a history of HTN, HLD, chronic systolic CHF, cardioembolic CVA, obesity, gout, and previous CKD stage IV who was transferred to [facility] from [facility] 8/27 where he was diagnosed with NSTEMI as well as the need to newly start hemodialysis.  He initially presented to [facility] ED that same day with refractory nausea vomiting and dyspnea, as well as a scrotal abscess.  Significant Events:  8/27 presented to [facility] ED - volume overloaded w/ NSTEMI - transfer to [facility] ICU 8/27 tunneled HD cath placed in IR at [facility] [place] - 1st HD session 8/27 Gen Surg evaluated scrotal abscess >  no need for intervention  8/28 L/R heart cath - NICM - borderline PAH - 2nd HD session 8/30 TR[facility] assumed care  Goals of Care:   Code Status: Full Code   DVT prophylaxis: heparin injection 5,000 Units Start: 05/18/23 2200 SCD's Start: 05/18/23 0947 SCDs Start: 05/17/23 1107  Interim Hx: Afebrile.  Some tachycardia with heart rates 105-111.  Blood pressure stable.  Saturation 100% room air.  Resting comfortably in a bedside chair.  No new complaints today.  States he is feeling much better after his recent dialysis treatments.  States that his scrotum lesion is much improved.  Assessment & Plan:  Type II NSTEMI Troponin peaked at approximately 5700 -care per cardiology -no obstructive coronary disease per left heart cath 8/28 -TTE noted EF 35% with workup consistent with NICM -continue aspirin plus statin -to be followed by the advanced heart failure team as an outpatient -continue diuresis as patient does produce urine  Acute exacerbation of chronic systolic CHF/NICM Care per cardiology with medications currently being titrated  Hypertensive urgency Has responded well to ongoing dialysis treatment -required nitroglycerin drip initiated which has been subsequently titrated off -continue BiDil -  home amlodipine and Coreg discontinued in setting of acutely decompensated CHF  Acute hypoxic respiratory failure due to pulmonary edema Corrected with diuresis and volume removal per dialysis with patient now on room air  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 Ongoing dialysis management per Nephrology -tunneled hemodialysis catheter placed in IR during this admission -Vascular Surgery has evaluated for more definitive long-term dialysis access but given his current scrotal abscess and recent non-STEMI it is not felt that he will be a candidate for vascular access surgery for at least a couple of weeks  Scrotal abscess No longer draining purulent fluid and is quite benign on exam at this time - ER attempted I&D but patient unable to tolerate -no overt signs of gas/necrotizing fasciitis -CT pelvis without evidence of tracking -evaluated by General Surgery 8/27 with no acute surgical interventions indicated -continue broad antibiotic coverage   Nutrition Status: Nutrition Problem: Severe Malnutrition Etiology: chronic illness Signs/Symptoms: moderate fat depletion, energy intake < 75% for > or equal to 1 month, percent weight loss   Family Communication: No family present at time of exam Disposition: Anticipate discharge early next week when outpatient dialysis confirmed   Objective: Blood pressure (!) 144/81, pulse (!) 104, temperature 97.9 F (36.6 C), resp. rate 19, height 5\' 11"  (1.803 m), weight 103.4 kg, SpO2 100%.  Intake/Output Summary (Last 24 hours) at 05/20/2023 1142 Last data filed at 05/20/2023 1131 Gross per 24 hour  Intake 566.15 ml  Output 2000 ml  Net -1433.85 ml   Filed Weights   05/20/23 0607 05/20/23 0811 05/20/23 1131  Weight: 102.7 kg 105.4 kg 103.4 kg    Examination: General: No acute respiratory distress Lungs: Clear to auscultation bilaterally without wheezes or crackles Cardiovascular: Regular rate and rhythm without murmur gallop or rub normal S1 and  S2 Abdomen: Nontender, nondistended, soft, bowel sounds positive, no rebound, no ascites, no appreciable mass Extremities: No significant cyanosis, clubbing, or edema bilateral lower extremities GU: No open drainage from scrotal skin at this time with no significant induration or erythema  CBC: Recent Labs  Lab 05/17/23 1201 05/18/23 0632 05/19/23 0209 05/20/23 0229 05/20/23 0659  WBC 16.1*   < > 12.8* 14.2* 14.0*  NEUTROABS 12.8*  --   --   --   --   HGB 10.7*   < > 8.3* 8.4* 8.3*  HCT 32.7*   < > 24.2* 25.4* 24.7*  MCV 94.2   < > 88.0 90.7 89.8  PLT 294   < > 223 196 192   < > = values in this interval not displayed.   Basic Metabolic Panel: Recent Labs  Lab 05/18/23 0632 05/18/23 0906 05/18/23 1741 05/19/23 0209 05/19/23 0553 05/20/23 0229  NA 137   < > 136 134* 132* 137  K 3.7   < > 3.8 3.3* 3.3* 3.3*  CL 101  --  102 97* 97* 101  CO2 17*  --  17* 22 21* 21*  GLUCOSE 85  --  115* 89 83 101*  BUN 113*  --  115* 71* 73* 83*  CREATININE 16.96*  --  17.42* 12.21* 12.36* 15.09*  CALCIUM 7.8*  --  7.9* 7.9* 7.8* 8.3*  MG 1.8  --   --  2.1  --  2.3  PHOS 7.1*  7.0*  --  7.7* 5.0*  --  6.9*   < > = values in this interval not displayed.   GFR: Estimated Creatinine Clearance: 8 mL/min (A) (by C-G formula based on SCr of 15.09 mg/dL (H)).   Scheduled Meds:  ascorbic acid  250 mg Oral BID   aspirin  81 mg Oral Daily   atorvastatin  20 mg Oral Daily   Chlorhexidine Gluconate Cloth  6 each Topical Q0600   ferric citrate  420 mg Oral TID WC   heparin  5,000 Units Subcutaneous Q8H   insulin aspart  0-9 Units Subcutaneous Q4H   isosorbide mononitrate  60 mg Oral Daily   losartan  25 mg Oral QHS   multivitamin  1 tablet Oral QHS   sodium chloride flush  3 mL Intravenous Q12H   thiamine  100 mg Oral Daily   Continuous Infusions:  sodium chloride Stopped (05/18/23 0733)   sodium chloride     anticoagulant sodium citrate     nitroGLYCERIN Stopped (05/19/23 0913)    piperacillin-tazobactam (ZOSYN)  IV 2.25 g (05/20/23 0332)     LOS: 3 days   Lonia Blood, MD [facility] Office  [phone] Pager - Text Page per Loretha Stapler  If 7PM-7AM, please contact night-coverage per Amion 05/20/2023, 11:42 AM

## 2023-05-20 NOTE — Plan of Care (Signed)

## 2023-05-20 NOTE — Progress Notes (Signed)
New Dialysis Start    Patient identified as new dialysis start. Kidney Education packet assembled and given. Discussed the following items with patient:     Current medications and possible changes once started:  Discussed that patient's medications may change over time.  Ex; hypertension medications and diabetes medication.  Nephrologists will adjust as needed.   Fluid restrictions reviewed:  32 oz daily goal:  All liquids count; soups, ice, jello, fruits. Will also refer dietitian.   Phosphorus and potassium: Handout given showing high potassium and phosphorus foods.  Alternative food and drink options given. Will also refer dietitian.   Family support:  Family at bedside.   Outpatient Clinic Resources:  Discussed roles of Outpatient clinic staff and advised to make a list of needs, if any, to talk with outpatient staff if needed.   Care plan schedule: Informed patient of Care Plans in outpatient setting and to participate in the care plan.  An invitation would be given from outpatient clinic.    Dialysis Access Options:  Reviewed access options with patients. Discussed in detail about care at home with new AVG & AVF. Reviewed checking bruit and thrill. If dialysis catheter present, educated that patient could not take showers.  Catheter dressing changes were to be done by outpatient clinic staff only.   Home therapy options:  Educated patient about home therapy options:  PD vs home hemo.    Educated patient on transplant option as well. Patient verbalized understanding of all information given and discussed. Will continue to round on patient during admission.    Jean Rosenthal Dialysis Nurse Coordinator [phone]

## 2023-05-20 NOTE — Progress Notes (Addendum)
Subjective:  patient with longstanding history of hypertension, discontinued all his medications due to loss of insurance, has had stage III chronic kidney disease since 2018, hyperlipidemia, morbid obesity now has lost weight recently unintentionally over time.  Now admitted to the hospital with worsening shortness of breath and found to have new onset cardiomyopathy with severe LV systolic dysfunction.  Patient states that his dyspnea has improved.  He was also diagnosed with pancreatitis in the outpatient basis, still continues to have some nausea as well after eating.  Intake/Output from previous day:  I/O last 3 completed shifts: In: 1275.6 [P.O.:480; I.V.:370.9; IV Piggyback:424.7] Out: 1800 [Urine:900; Other:900] Total I/O In: -  Out: 2000 [Other:2000] Net IO Since Admission: -2,771.36 mL [05/20/23 1220]  Blood pressure (!) 144/81, pulse (!) 104, temperature 97.9 F (36.6 C), resp. rate 19, height 5\' 11"  (1.803 m), weight 103.4 kg, SpO2 100%. Physical Exam Neck:     Vascular: No carotid bruit or JVD.  Cardiovascular:     Rate and Rhythm: Normal rate and regular rhythm.     Pulses: Intact distal pulses.     Heart sounds: Normal heart sounds. No murmur heard.    No gallop.  Pulmonary:     Effort: Pulmonary effort is normal.     Breath sounds: Normal breath sounds.  Abdominal:     General: Bowel sounds are normal.     Palpations: Abdomen is soft.  Musculoskeletal:     Right lower leg: No edema.     Left lower leg: No edema.     Lab Results: Lab Results  Component Value Date   NA 137 05/20/2023   K 3.3 (L) 05/20/2023   CO2 21 (L) 05/20/2023   GLUCOSE 101 (H) 05/20/2023   BUN 83 (H) 05/20/2023   CREATININE 15.09 (H) 05/20/2023   CALCIUM 8.3 (L) 05/20/2023   EGFR 3 (L) 01/14/2023   GFRNONAA 4 (L) 05/20/2023    BNP (last 3 results) Recent Labs    05/17/23 0506  BNP 925.0*    ProBNP (last 3 results) No results for input(s): "PROBNP" in the last 8760 hours.     Latest Ref Rng & Units 05/20/2023    2:29 AM 05/19/2023    5:53 AM 05/19/2023    2:09 AM  BMP  Glucose 70 - 99 mg/dL 657  83  89   BUN 6 - 20 mg/dL 83  73  71   Creatinine 0.61 - 1.24 mg/dL 84.69  62.95  28.41   Sodium 135 - 145 mmol/L 137  132  134   Potassium 3.5 - 5.1 mmol/L 3.3  3.3  3.3   Chloride 98 - 111 mmol/L 101  97  97   CO2 22 - 32 mmol/L 21  21  22    Calcium 8.9 - 10.3 mg/dL 8.3  7.8  7.9       Latest Ref Rng & Units 05/20/2023    2:29 AM 05/19/2023    2:09 AM 05/18/2023    5:41 PM  Hepatic Function  Albumin 3.5 - 5.0 g/dL 2.7  2.7  2.7       Latest Ref Rng & Units 05/20/2023    6:59 AM 05/20/2023    2:29 AM 05/19/2023    2:09 AM  CBC  WBC 4.0 - 10.5 K/uL 14.0  14.2  12.8   Hemoglobin 13.0 - 17.0 g/dL 8.3  8.4  8.3   Hematocrit 39.0 - 52.0 % 24.7  25.4  24.2   Platelets 150 -  400 K/uL 192  196  223    Lipid Panel     Component Value Date/Time   CHOL 102 05/18/2023 0632   TRIG 70 05/18/2023 0632   HDL 26 (L) 05/18/2023 0632   CHOLHDL 3.9 05/18/2023 0632   VLDL 14 05/18/2023 0632   LDLCALC 62 05/18/2023 0632   LDLCALC 68 01/14/2023 1419   LDLDIRECT 60 05/18/2023 0632   HEMOGLOBIN A1C Lab Results  Component Value Date   HGBA1C 5.8 (H) 05/17/2023   MPG 119.76 05/17/2023   TSH Lab Results  Component Value Date   TSH 2.90 06/03/2022    Recent Labs    06/03/22 0000  TSH 2.90    Radiology:   Reviewed  Cardiac Studies:    EKG:  EKG 05/19/2023: Sinus tachycardia at rate of 1 1 2  bpm, leftward axis, LVH with repolarization abnormality.  Echocardiogram 05/17/2023: 1. Left ventricular ejection fraction, by estimation, is 25%. The left ventricle has severely decreased function. The left ventricle demonstrates regional wall motion abnormalities (see scoring diagram/findings for description). The left ventricular  internal cavity size was mildly dilated. There is moderate concentric left ventricular hypertrophy. Left ventricular diastolic parameters are  indeterminate. Largely hypokinetic save for the apex.  2. Right ventricular systolic function is hyperdynamic. The right ventricular size is normal.  3. Left atrial size was severely dilated.  4. The mitral valve is grossly normal. Mild mitral valve regurgitation. No evidence of mitral stenosis.  5. The aortic valve was not well visualized. There is mild thickening of the aortic valve. Aortic valve regurgitation is not visualized.  6. The inferior vena cava is normal in size with greater than 50% respiratory variability, suggesting right atrial pressure of 3 mmHg.   Left & Right Heart Catheterization 05/18/23:    Right dominant circulation Calcified and ectatic vessels with minimal irregularities No obstructive coronary artery disease   RA: 5 mmHg RV: 39/5 mmHg PA: 29/19 mmHg, mPAP 23 mmHg PCW: 17 mmHg   LV: 139/1 mmHg, LVEDP 29 mmHg CO: 9.4 L/min CI: 4.3 L/min/m2   Nonischemic cardiomyopathy Mild decompensation Borderline pulmonary hypertension (WHO Grp II)     Scheduled Meds:  ascorbic acid  250 mg Oral BID   aspirin  81 mg Oral Daily   atorvastatin  20 mg Oral Daily   carvedilol  12.5 mg Oral BID WC   Chlorhexidine Gluconate Cloth  6 each Topical Q0600   ferric citrate  420 mg Oral TID WC   heparin  5,000 Units Subcutaneous Q8H   multivitamin  1 tablet Oral QHS   sacubitril-valsartan  1 tablet Oral BID   sodium chloride flush  3 mL Intravenous Q12H   thiamine  100 mg Oral Daily   Continuous Infusions:  sodium chloride Stopped (05/18/23 0733)   sodium chloride     piperacillin-tazobactam (ZOSYN)  IV 2.25 g (05/20/23 0332)   PRN Meds:.sodium chloride, sodium chloride, acetaminophen, docusate sodium, iohexol, ondansetron (ZOFRAN) IV, mouth rinse, polyethylene glycol, sodium chloride flush  Assessment  Zachary Lynch is a 37 y.o. male patient with longstanding history of hypertension, discontinued all his medications due to loss of insurance, has had stage III chronic  kidney disease since 2018, hyperlipidemia, morbid obesity now has lost weight recently unintentionally over time.  Now admitted to the hospital with worsening shortness of breath and found to have new onset cardiomyopathy with severe LV systolic dysfunction.  1.  Nonischemic cardiomyopathy secondary to burned-out hypertension 2.  Hypertension with hypertensive heart disease with heart  failure 3.  Acute on chronic systolic heart failure 4.  End-stage renal disease now on hemodialysis, he has nonoliguric renal failure 5.  Nausea and abdominal discomfort  Plan:   Will try to optimize him medically, best option is to aggressively control his blood pressure, start him on beta-blocker therapy and also will try Entresto and discontinue losartan and keeping close eye on hyperkalemia.  Fortunately patient has an-uric renal failure hence is not obviously volume overloaded hence risk of hyperkalemia may be low.  Depending upon his response, we could also then consider addition of Aldactone from cardiac standpoint in view of help with remodeling.  Will follow-up on potassium levels in the morning after he has received at least 1 dose of Entresto tonight.  He can be started on higher dose of carvedilol as his reserve is fairly good with preserved cardiac output and index and is hypertensive as well.  If indeed he tolerates the above 2 medications, will consider addition of BiDil from heart failure standpoint as well for mortality and heart failure hospitalization.  Patient has been diagnosed with pancreatitis as well in the past, in April 2024, had elevated amylase and lipase, will repeat the levels as well.  Although he has nonischemic cardiomyopathy, he is presently on aspirin, in view of presence of hemodialysis catheter in his right subclavian vein and plans for placement of AV shunt, would probably consider continuing aspirin indefinitely.    Yates Decamp, MD, [facility] 05/20/2023, 12:20 PM Office:  [phone] Fax: [phone] Pager: [phone]

## 2023-05-20 NOTE — Progress Notes (Addendum)
[facility] NEPHROLOGY PROGRESS NOTE  Assessment/ Plan: Pt is a 37 y.o. 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 Status post tunneled HD catheter by IR on 8/27 and received first HD and  second HD 8/29, third today. Renal navigator is  arranging outpatient dialysis with [facility]-  now has been accepted on TTS schedule -  could not start there til Tuesday-  SO I will do a short HD treatment on Saturday ( tomorrow) so that he could be discharged over the weekend if other services desire. Seen by vascular surgeon for permanent access, not stable for surgery at this time.   pt interested in home dialysis options  # NSTEMI: Seen by cardiologist and cath showed nonischemic cardiomyopathy.  Continue current cardiac medications-  coreg, entresto.now  # Acute on chronic systolic CHF/cardiorenal syndrome: EF of 25%.   Volume management with dialysis.  # Scrotal abscess: Seen by surgeon, no surgical intervention, getting Zosyn.  May keep him here as inpatient   # Anemia of CKD: Hemoglobin low, iron saturation 35 with low ferritin level.  Ordered IV iron. Will add ESA  # HTN/volume: Continue cardiac medication and volume management dialysis.  BP Is great today -  caution with coreg and entresto if he will tolerate  # CKD-MBD, hyperphosphatemia: Started Auryxia with meals.  Pending PTH level.  # Hypokalemia:  Remains low today -   will run on high K bath    Subjective: Seen in dialysis-  is asking about working on dialysis   Objective Vital signs in last 24 hours: Vitals:   05/19/23 1900 05/19/23 2304 05/20/23 0449 05/20/23 0607  BP: 135/86 136/84 124/68   Pulse: 100 (!) 107 (!) 105   Resp: 20 20 20    Temp: 97.9 F (36.6 C) 98.8 F (37.1 C) 98.5 F (36.9 C)   TempSrc: Oral Oral Oral   SpO2: 96% 98% 96%   Weight:     102.7 kg  Height:       Weight change: -2 kg  Intake/Output Summary (Last 24 hours) at 05/20/2023 0750 Last data filed at 05/20/2023 0100 Gross per 24 hour  Intake 566.15 ml  Output --  Net 566.15 ml       Labs: RENAL PANEL Recent Labs  Lab 05/17/23 0802 05/17/23 1201 05/18/23 1610 05/18/23 0906 05/18/23 0910 05/18/23 1741 05/19/23 0209 05/19/23 0553 05/20/23 0229  NA  --  138 137   < > 138 136 134* 132* 137  K  --  4.3 3.7   < > 3.7 3.8 3.3* 3.3* 3.3*  CL  --  107 101  --   --  102 97* 97* 101  CO2  --  12* 17*  --   --  17* 22 21* 21*  GLUCOSE  --  86 85  --   --  115* 89 83 101*  BUN  --  150* 113*  --   --  115* 71* 73* 83*  CREATININE  --  21.88* 16.96*  --   --  17.42* 12.21* 12.36* 15.09*  CALCIUM  --  7.6* 7.8*  --   --  7.9* 7.9* 7.8* 8.3*  MG 1.6* 2.0 1.8  --   --   --  2.1  --  2.3  PHOS 10.5* 10.2* 7.1*  7.0*  --   --  7.7* 5.0*  --  6.9*  ALBUMIN  --  3.3* 2.8*  --   --  2.7* 2.7*  --  2.7*   < > = values in this interval not displayed.    Liver Function Tests: Recent Labs  Lab 05/17/23 0506 05/17/23 1201 05/18/23 0632 05/18/23 1741 05/19/23 0209 05/20/23 0229  AST 21 23  --   --   --   --   ALT 14 15  --   --   --   --   ALKPHOS 56 57  --   --   --   --   BILITOT 0.4 0.4  --   --   --   --   PROT 7.0 7.0  --   --   --   --   ALBUMIN 3.6 3.3*   < > 2.7* 2.7* 2.7*   < > = values in this interval not displayed.   Recent Labs  Lab 05/17/23 0506  LIPASE 116*   No results for input(s): "AMMONIA" in the last 168 hours. CBC: Recent Labs    05/17/23 0802 05/17/23 1201 05/18/23 0907 05/18/23 0910 05/19/23 0209 05/20/23 0229 05/20/23 0659  HGB  --    < > 8.5* 8.5* 8.3* 8.4* 8.3*  MCV  --    < >  --   --  88.0 90.7 89.8  FERRITIN 47  --   --   --   --   --   --   TIBC 156*  --   --   --   --   --   --   IRON 55  --   --   --   --   --   --    < > = values in this interval not displayed.    Cardiac Enzymes: No results for input(s):  "CKTOTAL", "CKMB", "CKMBINDEX", "TROPONINI" in the last 168 hours. CBG: Recent Labs  Lab 05/19/23 0802 05/19/23 1122 05/19/23 1515 05/19/23 2308 05/20/23 0556  GLUCAP 79 93 88 75 73    Iron Studies:  Recent Labs    05/17/23 0802  IRON 55  TIBC 156*  FERRITIN 47   Studies/Results: VAS [place] UPPER EXT VEIN MAPPING (PRE-OP AVF)  Result Date: 05/18/2023 UPPER EXTREMITY VEIN MAPPING Patient Name:  Zachary Lynch  Date of Exam:   05/18/2023 Medical Rec #: [id number]     Accession #:    [id number] Date of Birth: [birth year]      Patient Gender: M Patient Age:   31 years Exam Location:  [facility] Procedure:      VAS [place] UPPER EXT VEIN MAPPING (PRE-OP AVF) Referring Phys: Crista Elliot --------------------------------------------------------------------------------  Indications: Pre-access. History: CKD Stage 4.  Performing Technologist: Shona Simpson  Examination Guidelines: A complete evaluation includes B-mode imaging, spectral Doppler, color Doppler, and power Doppler as needed of all accessible portions of each vessel. Bilateral testing is considered an integral part of a complete examination. Limited examinations for reoccurring indications may be performed as noted. +-----------------+-------------+----------+---------+ Right Cephalic   Diameter (cm)Depth (cm)Findings  +-----------------+-------------+----------+---------+ Shoulder             0.26        0.78             +-----------------+-------------+----------+---------+ Prox upper arm       0.39        0.58             +-----------------+-------------+----------+---------+ Mid upper arm  0.31        0.23             +-----------------+-------------+----------+---------+ Dist upper arm       0.23        0.37             +-----------------+-------------+----------+---------+ Antecubital fossa    0.25        0.23             +-----------------+-------------+----------+---------+ Prox forearm         0.34         0.48             +-----------------+-------------+----------+---------+ Mid forearm          0.31        0.39   branching +-----------------+-------------+----------+---------+ Dist forearm         0.27        0.24             +-----------------+-------------+----------+---------+ Wrist                0.27        0.21             +-----------------+-------------+----------+---------+ +-----------------+-------------+----------+--------------+ Right Basilic    Diameter (cm)Depth (cm)   Findings    +-----------------+-------------+----------+--------------+ Shoulder                                not visualized +-----------------+-------------+----------+--------------+ Prox upper arm       0.40        1.44                  +-----------------+-------------+----------+--------------+ Mid upper arm        0.51        1.33                  +-----------------+-------------+----------+--------------+ Dist upper arm       0.55        1.00                  +-----------------+-------------+----------+--------------+ Antecubital fossa    0.58        0.46                  +-----------------+-------------+----------+--------------+ Prox forearm         0.37        0.30     branching    +-----------------+-------------+----------+--------------+ Mid forearm                             not visualized +-----------------+-------------+----------+--------------+ Distal forearm                          not visualized +-----------------+-------------+----------+--------------+ Wrist                                   not visualized +-----------------+-------------+----------+--------------+ +-----------------+-------------+----------+--------------+ Left Cephalic    Diameter (cm)Depth (cm)   Findings    +-----------------+-------------+----------+--------------+ Shoulder             0.29        0.67                   +-----------------+-------------+----------+--------------+ Prox upper arm       0.26  0.55                  +-----------------+-------------+----------+--------------+ Mid upper arm        0.27        0.52                  +-----------------+-------------+----------+--------------+ Dist upper arm       0.21        0.32                  +-----------------+-------------+----------+--------------+ Antecubital fossa    0.21        0.19                  +-----------------+-------------+----------+--------------+ Prox forearm         0.24        0.48                  +-----------------+-------------+----------+--------------+ Mid forearm          0.24        0.42   not visualized +-----------------+-------------+----------+--------------+ Dist forearm         0.46        0.22                  +-----------------+-------------+----------+--------------+ Wrist                0.15        0.19                  +-----------------+-------------+----------+--------------+ +-----------------+-------------+----------+--------------+ Left Basilic     Diameter (cm)Depth (cm)   Findings    +-----------------+-------------+----------+--------------+ Shoulder                                not visualized +-----------------+-------------+----------+--------------+ Prox upper arm       0.47        1.04                  +-----------------+-------------+----------+--------------+ Mid upper arm        0.41        1.03                  +-----------------+-------------+----------+--------------+ Dist upper arm       0.43        0.92                  +-----------------+-------------+----------+--------------+ Antecubital fossa    0.43        0.65     branching    +-----------------+-------------+----------+--------------+ Prox forearm         0.25        0.34                  +-----------------+-------------+----------+--------------+ Mid forearm          0.19         0.20                  +-----------------+-------------+----------+--------------+ Distal forearm       0.15        0.20     branching    +-----------------+-------------+----------+--------------+ Wrist                0.12        0.16                  +-----------------+-------------+----------+--------------+ *See table(s) above for measurements  and observations.  Diagnosing physician: Sherald Hess MD Electronically signed by Sherald Hess MD on 05/18/2023 at 4:19:09 PM.    Final    CARDIAC CATHETERIZATION  Addendum Date: 05/18/2023   Left and right heart catheterization 05/18/2023: Right dominant circulation Calcified and ectatic vessels with minimal irregularities No obstructive coronary artery disease RA: 5 mmHg RV: 39/5 mmHg PA: 29/19 mmHg, mPAP 23 mmHg PCW: 17 mmHg LV: 139/1 mmHg, LVEDP 29 mmHg CO: 9.4 L/min CI: 4.3 L/min/m2 Nonischemic cardiomyopathy Mild decompensation Borderline pulmonary hypertension (WHO Grp II) [name], MD Pager: [phone] Office: [phone]   Result Date: 05/18/2023 Images from the original result were not included. Left and right heart catheterization 05/18/2023: Right dominant circulation Calcified and ectatic vessels with minimal irregularities No obstructive coronary artery disease RA: 5 mmHg RV: 39/5 mmHg PA: 29/19 mmHg, mPAP 23 mmHg PCW: 17 mmHg LV: 139/1 mmHg, LVEDP 29 mmHg CO: 9.4 L/min CI: 4.3 L/min/m2 [name], MD Pager: [phone] Office: [phone]   Medications: Infusions:  sodium chloride Stopped (05/18/23 0733)   sodium chloride     anticoagulant sodium citrate     nitroGLYCERIN Stopped (05/19/23 0913)   piperacillin-tazobactam (ZOSYN)  IV 2.25 g (05/20/23 0332)    Scheduled Medications:  ascorbic acid  250 mg Oral BID   aspirin  81 mg Oral Daily   atorvastatin  20 mg Oral Daily   Chlorhexidine Gluconate Cloth  6 each Topical Q0600   ferric citrate  420 mg Oral TID WC   heparin  5,000 Units  Subcutaneous Q8H   hydrALAZINE  50 mg Oral TID   insulin aspart  0-9 Units Subcutaneous Q4H   isosorbide mononitrate  60 mg Oral Daily   multivitamin  1 tablet Oral QHS   sodium chloride flush  3 mL Intravenous Q12H   thiamine  100 mg Oral Daily    have reviewed scheduled and prn medications.  Physical Exam: General:NAD, comfortable Heart:RRR, s1s2 nl Lungs: Clear b/l, no crackle Abdomen:soft, Non-tender, non-distended Extremities:No edema Dialysis Access: Right IJ TDC in place.  Netty Sullivant A Keyly Baldonado 05/20/2023,7:50 AM  LOS: 3 days

## 2023-05-20 NOTE — Progress Notes (Signed)
VASCULAR SURGERY:  The patient has a scrotal abscess.  He had a recent NSTEMI.  He has a functioning dialysis catheter.  I do not think he will be ready for new access in his arm for at least a couple of weeks given the scrotal abscess.  Please reconsult [place] when he is medically ready for placement of access in his arm.  We could also arrange this as an outpatient once he is discharged.  Vascular surgery will be available as needed.  Cari Caraway, MD 9:21 AM

## 2023-05-21 DIAGNOSIS — I214 Non-ST elevation (NSTEMI) myocardial infarction: Secondary | ICD-10-CM | POA: Diagnosis not present

## 2023-05-21 LAB — CBC
HCT: 27.8 % — ABNORMAL LOW (ref 39.0–52.0)
Hemoglobin: 9.1 g/dL — ABNORMAL LOW (ref 13.0–17.0)
MCH: 31 pg (ref 26.0–34.0)
MCHC: 32.7 g/dL (ref 30.0–36.0)
MCV: 94.6 fL (ref 80.0–100.0)
Platelets: 195 10*3/uL (ref 150–400)
RBC: 2.94 MIL/uL — ABNORMAL LOW (ref 4.22–5.81)
RDW: 14.3 % (ref 11.5–15.5)
WBC: 13.1 10*3/uL — ABNORMAL HIGH (ref 4.0–10.5)
nRBC: 0.2 % (ref 0.0–0.2)

## 2023-05-21 LAB — GLUCOSE, CAPILLARY
Glucose-Capillary: 88 mg/dL (ref 70–99)
Glucose-Capillary: 130 mg/dL — ABNORMAL HIGH (ref 70–99)

## 2023-05-21 LAB — RENAL FUNCTION PANEL
Albumin: 2.8 g/dL — ABNORMAL LOW (ref 3.5–5.0)
Anion gap: 14 (ref 5–15)
BUN: 49 mg/dL — ABNORMAL HIGH (ref 6–20)
CO2: 25 mmol/L (ref 22–32)
Calcium: 8.3 mg/dL — ABNORMAL LOW (ref 8.9–10.3)
Chloride: 99 mmol/L (ref 98–111)
Creatinine, Ser: 11.51 mg/dL — ABNORMAL HIGH (ref 0.61–1.24)
GFR, Estimated: 5 mL/min — ABNORMAL LOW (ref >60)
Glucose, Bld: 107 mg/dL — ABNORMAL HIGH (ref 70–99)
Phosphorus: 5.6 mg/dL — ABNORMAL HIGH (ref 2.5–4.6)
Potassium: 3.4 mmol/L — ABNORMAL LOW (ref 3.5–5.1)
Sodium: 138 mmol/L (ref 135–145)

## 2023-05-21 LAB — MAGNESIUM: Magnesium: 1.9 mg/dL (ref 1.7–2.4)

## 2023-05-21 LAB — PARATHYROID HORMONE, INTACT (NO CA): PTH: 1090 pg/mL — ABNORMAL HIGH (ref 15–65)

## 2023-05-21 MED ORDER — DARBEPOETIN ALFA 100 MCG/0.5ML IJ SOSY: 100.0000 ug | PREFILLED_SYRINGE | INTRAMUSCULAR | Status: DC

## 2023-05-21 MED ORDER — RENA-VITE PO TABS: 1.0000 | ORAL_TABLET | Freq: Every day | ORAL | 0 refills | Status: DC

## 2023-05-21 MED ORDER — ASPIRIN 81 MG PO TBEC: 81.0000 mg | DELAYED_RELEASE_TABLET | Freq: Every day | ORAL | Status: DC

## 2023-05-21 MED ORDER — AMOXICILLIN-POT CLAVULANATE 500-125 MG PO TABS: 1.0000 | ORAL_TABLET | ORAL | 0 refills | Status: DC

## 2023-05-21 MED ORDER — FERRIC CITRATE 1 GM 210 MG(FE) PO TABS: 420.0000 mg | ORAL_TABLET | Freq: Three times a day (TID) | ORAL | 0 refills | Status: DC

## 2023-05-21 MED ORDER — FERRIC CITRATE 1 GM 210 MG(FE) PO TABS: 420.0000 mg | ORAL_TABLET | Freq: Three times a day (TID) | ORAL | 0 refills | Status: AC

## 2023-05-21 MED ORDER — CARVEDILOL 12.5 MG PO TABS: 12.5000 mg | ORAL_TABLET | Freq: Two times a day (BID) | ORAL | 0 refills | Status: DC

## 2023-05-21 MED ORDER — ISOSORB DINITRATE-HYDRALAZINE 20-37.5 MG PO TABS: 1.0000 | ORAL_TABLET | Freq: Three times a day (TID) | ORAL | 0 refills | Status: DC

## 2023-05-21 MED ORDER — ATORVASTATIN CALCIUM 20 MG PO TABS: 20.0000 mg | ORAL_TABLET | Freq: Every day | ORAL | 0 refills | Status: DC

## 2023-05-21 MED ORDER — AMOXICILLIN-POT CLAVULANATE 500-125 MG PO TABS
1.0000 | ORAL_TABLET | ORAL | Status: DC
  Filled 2023-05-21: qty 1

## 2023-05-21 MED ORDER — HEPARIN SODIUM (PORCINE) 1000 UNIT/ML IJ SOLN
INTRAMUSCULAR | Status: AC
  Filled 2023-05-21: qty 4

## 2023-05-21 MED ORDER — ACETAMINOPHEN 325 MG PO TABS: 650.0000 mg | ORAL_TABLET | ORAL | Status: DC | PRN

## 2023-05-21 MED ORDER — SACUBITRIL-VALSARTAN 24-26 MG PO TABS: 1.0000 | ORAL_TABLET | Freq: Two times a day (BID) | ORAL | 0 refills | Status: DC

## 2023-05-21 NOTE — Procedures (Signed)
HD Note:  Some information was entered later than the data was gathered due to patient care needs. The stated time with the data is accurate.  Received patient in bed to unit.   Alert and oriented.   Informed consent signed and in chart.   Access used: Upper right chest HD catheter Access issues: no issues  Patient tolerated treatment well.   TX duration: 2.5  Alert, without acute distress.  Patient orders were to keep fluid volume even.  Patient was kept even  Hand-off given to patient's nurse.   Transported back to the room   Lawrance Wiedemann L. Dareen Piano, RN Kid[facility] Dialysis Unit.

## 2023-05-21 NOTE — Consult Note (Signed)
Pharmacy Antibiotic Note  Zachary Lynch is a 37 y.o. male admitted on 05/17/2023 with scrotal abscess.  Pharmacy has been consulted for Augmentin dosing.  WBC 13.1, down slightly.  Afebrile.  Plan: Stop Zosyn Start Augmentin 500-125 mg Q24h after HD x6 days (10 days abx total)  Height: 5\' 11"  (180.3 cm) Weight: 100.1 kg (220 lb 10.9 oz) IBW/kg (Calculated) : 75.3  Temp (24hrs), Avg:98.3 F (36.8 C), Min:97.9 F (36.6 C), Max:98.8 F (37.1 C)  Recent Labs  Lab 05/17/23 1201 05/17/23 1458 05/18/23 5284 05/18/23 1741 05/19/23 0209 05/19/23 0553 05/20/23 0229 05/20/23 0659 05/21/23 0246  WBC 16.1*  --  14.3*  --  12.8*  --  14.2* 14.0* 13.1*  CREATININE 21.88*  --  16.96* 17.42* 12.21* 12.36* 15.09*  --  11.51*  LATICACIDVEN 1.2 1.5  --   --   --   --   --   --   --     Estimated Creatinine Clearance: 10.4 mL/min (A) (by C-G formula based on SCr of 11.51 mg/dL (H)).    Allergies  Allergen Reactions   Ultram [Tramadol] Nausea Only    Disorientation    Ventolin [Albuterol] Other (See Comments)    Reaction is unknown. Allergy was entered post Stroke "caused him to have a stroke"      Antimicrobials this admission: Zosyn 8/27 >> 8/31  Microbiology results: 8/27 MRSA PCR: negative  Thank you for allowing pharmacy to be a part of this patient's care.  Kirk Ruths Paytes 05/21/2023 10:20 AM

## 2023-05-21 NOTE — Progress Notes (Signed)
[facility] NEPHROLOG[facility] PROGRESS NOTE  Assessment/ Plan: Pt is a 37 y.o. 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 Status post tunneled HD catheter by IR on 8/27 and received first HD and  second HD 8/29, third 8/30 Renal navigator is  arranging outpatient dialysis with [facility]-  now has been accepted on TTS schedule [facility] -  could not start there til Tuesday-  SO I will do a short HD treatment  today so that he could be discharged over the weekend if other services desire. Seen by vascular surgeon for permanent access, not stable for surgery at this time.   Pt is interested in home dialysis options  # NSTEMI: Seen by cardiologist and cath showed nonischemic cardiomyopathy.  Continue current cardiac medications-  coreg, entresto.now  # Acute on chronic systolic CHF/cardiorenal syndrome: EF of 25%.   Volume management with dialysis.  # Scrotal abscess: Seen by surgeon, no surgical intervention, getting Zosyn.  May keep him here as inpatient   # Anemia of CKD: Hemoglobin low, iron saturation 35 with low ferritin level.  Ordered IV iron. Will add ESA  # HTN/volume: Continue cardiac medication and volume management dialysis.  BP Is great today -  caution with coreg and entresto if he will tolerate  # CKD-MBD, hyperphosphatemia: Started Auryxia with meals.  Pending PTH level.  # Hypokalemia:  Remains low today -   run on high K bath    Subjective:  no new complaints-  sleepy-  understands that his OP HD has been arranged-  agrees to run short HD today and is hopeful for discharge after that   Objective Vital signs in last 24 hours: Vitals:   05/20/23 1958 05/20/23 2310 05/21/23 0328 05/21/23 0417  BP: 131/87 (!) 132/92 130/87   Pulse: 87 94 91   Resp: 17  20   Temp: 98.5 F (36.9 C) 98.8 F (37.1 C) 98.1 F (36.7 C)    TempSrc: Oral Oral Oral   SpO2: 98% 97% 98%   Weight:    100.1 kg  Height:       Weight change: 2.7 kg  Intake/Output Summary (Last 24 hours) at 05/21/2023 0830 Last data filed at 05/20/2023 2000 Gross per 24 hour  Intake 240 ml  Output 2000 ml  Net -1760 ml       Labs: RENAL PANEL Recent Labs  Lab 05/17/23 1201 05/18/23 0981 05/18/23 0906 05/18/23 1741 05/19/23 0209 05/19/23 0553 05/20/23 0229 05/21/23 0246  NA 138 137   < > 136 134* 132* 137 138  K 4.3 3.7   < > 3.8 3.3* 3.3* 3.3* 3.4*  CL 107 101  --  102 97* 97* 101 99  CO2 12* 17*  --  17* 22 21* 21* 25  GLUCOSE 86 85  --  115* 89 83 101* 107*  BUN 150* 113*  --  115* 71* 73* 83* 49*  CREATININE 21.88* 16.96*  --  17.42* 12.21* 12.36* 15.09* 11.51*  CALCIUM 7.6* 7.8*  --  7.9* 7.9* 7.8* 8.3* 8.3*  MG 2.0 1.8  --   --  2.1  --  2.3 1.9  PHOS 10.2* 7.1*  7.0*  --  7.7* 5.0*  --  6.9* 5.6*  ALBUMIN 3.3* 2.8*  --  2.7* 2.7*  --  2.7* 2.8*   < > = values in  this interval not displayed.    Liver Function Tests: Recent Labs  Lab 05/17/23 0506 05/17/23 1201 05/18/23 0632 05/19/23 0209 05/20/23 0229 05/21/23 0246  AST 21 23  --   --   --   --   ALT 14 15  --   --   --   --   ALKPHOS 56 57  --   --   --   --   BILITOT 0.4 0.4  --   --   --   --   PROT 7.0 7.0  --   --   --   --   ALBUMIN 3.6 3.3*   < > 2.7* 2.7* 2.8*   < > = values in this interval not displayed.   Recent Labs  Lab 05/17/23 0506 05/20/23 0229  LIPASE 116* 90*  AMYLASE  --  248*   No results for input(s): "AMMONIA" in the last 168 hours. CBC: Recent Labs    05/17/23 0802 05/17/23 1201 05/18/23 0910 05/19/23 0209 05/20/23 0229 05/20/23 0659 05/21/23 0246  HGB  --    < > 8.5* 8.3* 8.4* 8.3* 9.1*  MCV  --    < >  --  88.0 90.7 89.8 94.6  FERRITIN 47  --   --   --   --   --   --   TIBC 156*  --   --   --   --   --   --   IRON 55  --   --   --   --   --   --    < > = values in this interval not displayed.    Cardiac  Enzymes: No results for input(s): "CKTOTAL", "CKMB", "CKMBINDEX", "TROPONINI" in the last 168 hours. CBG: Recent Labs  Lab 05/20/23 0556 05/20/23 1215 05/20/23 1812 05/20/23 2346 05/21/23 0639  GLUCAP 73 70 98 83 88    Iron Studies:  No results for input(s): "IRON", "TIBC", "TRANSFERRIN", "FERRITIN" in the last 72 hours.  Studies/Results: No results found.  Medications: Infusions:  sodium chloride Stopped (05/18/23 0733)   piperacillin-tazobactam (ZOSYN)  IV 2.25 g (05/21/23 0356)    Scheduled Medications:  ascorbic acid  250 mg Oral BID   aspirin  81 mg Oral Daily   atorvastatin  20 mg Oral Daily   carvedilol  12.5 mg Oral BID WC   Chlorhexidine Gluconate Cloth  6 each Topical Q0600   darbepoetin (ARANESP) injection - DIALYSIS  100 mcg Subcutaneous Q Fri-1800   ferric citrate  420 mg Oral TID WC   heparin  5,000 Units Subcutaneous Q8H   multivitamin  1 tablet Oral QHS   sacubitril-valsartan  1 tablet Oral BID   thiamine  100 mg Oral Daily    have reviewed scheduled and prn medications.  Physical Exam: General:NAD, comfortable Heart:RRR, s1s2 nl Lungs: Clear b/l, no crackle Abdomen:soft, Non-tender, non-distended Extremities:No edema Dialysis Access: Right IJ TDC   Etan Vasudevan A Tonae Livolsi 05/21/2023,8:30 AM  LOS: 4 days

## 2023-05-21 NOTE — Plan of Care (Signed)

## 2023-05-21 NOTE — TOC Transition Note (Signed)
Transition of Care [facility]) - CM/SW Discharge Note   Patient Details  Name: Zachary Lynch MRN: [id number] Date of Birth: [birth year]  [facility] CM/SW Contact:  Ronny Bacon, RN Phone Number: 05/21/2023, 4:24 PM   Clinical Narrative:   Patient is being discharged today. Met with patient in dialysis and was given 30 day free Entresto Card along with [facility] Co-Pay card with instructions on use.    Final next level of care: Home/Self Care Barriers to Discharge: No Barriers Identified   Patient Goals and CMS Choice      Discharge Placement                         Discharge Plan and Services Additional resources added to the After Visit Summary for     Discharge Planning Services: CM Consult                                 Social Determinants of Health (SDOH) Interventions SDOH Screenings   Food Insecurity: No Food Insecurity (05/19/2023)  Housing: Low Risk  (05/19/2023)  Transportation Needs: No Transportation Needs (05/19/2023)  Utilities: Not At Risk (05/19/2023)  Alcohol Screen: Low Risk  (05/19/2023)  Depression (PHQ2-9): Low Risk  (01/14/2023)  Financial Resource Strain: Low Risk  (05/19/2023)  Physical Activity: Unknown (01/13/2023)  Social Connections: Moderately Integrated (01/13/2023)  Stress: Stress Concern Present (01/13/2023)  Tobacco Use: Low Risk  (05/17/2023)     Readmission Risk Interventions     No data to display

## 2023-05-21 NOTE — Progress Notes (Signed)
Progress Note  Patient Name: Zachary Lynch MRN: [id number] DOB: 10/23/1985 Date of Encounter: 05/21/2023  Attending physician: Lonia Blood, MD Primary care provider: [name], NP  Subjective: Zachary Lynch is a 37 y.o. African-American male who was seen and examined in dialysis Denies anginal chest pain or heart failure symptoms. States that he feels significantly better compared to when he came in. Still makes urine.  Objective: Vital Signs in the last 24 hours: Temp:  [97.9 F (36.6 C)-98.8 F (37.1 C)] 98 F (36.7 C) (08/31 1420) Pulse Rate:  [86-109] 93 (08/31 1621) Resp:  [14-24] 21 (08/31 1621) BP: (130-154)/(87-109) 154/109 (08/31 1621) SpO2:  [97 %-100 %] 100 % (08/31 1621) Weight:  [100.1 kg] 100.1 kg (08/31 0417)  Intake/Output:  Intake/Output Summary (Last 24 hours) at 05/21/2023 1633 Last data filed at 05/21/2023 1000 Gross per 24 hour  Intake 477 ml  Output --  Net 477 ml    Net IO Since Admission: -2,294.36 mL [05/21/23 1633]  Weights:     05/21/2023    4:17 AM 05/20/2023   11:31 AM 05/20/2023    8:11 AM  Last 3 Weights  Weight (lbs) 220 lb 10.9 oz 227 lb 15.3 oz 232 lb 5.8 oz  Weight (kg) 100.1 kg 103.4 kg 105.4 kg      Physical examination: PHYSICAL EXAM: Vitals:   05/21/23 1526 05/21/23 1556 05/21/23 1606 05/21/23 1621  BP: (!) 145/105 (!) 152/103 (!) 154/109 (!) 154/109  Pulse: 94 86 91 93  Resp: (!) 21 16 14  (!) 21  Temp:      TempSrc:      SpO2: 99% 100% 100% 100%  Weight:      Height:        Physical Exam  Constitutional: No distress. He appears chronically ill.  Appears older than stated age, hemodynamically stable.   Neck: No JVD present.  Cardiovascular: Normal rate, regular rhythm, S1 normal and S2 normal. Exam reveals no gallop, no S3 and no S4.  No murmur heard. Pulmonary/Chest: Effort normal and breath sounds normal. No stridor. He has no wheezes. He has no rales.  Dialysis catheter present over the right anterior chest  wall  Abdominal: Soft. Bowel sounds are normal. He exhibits no distension. There is no abdominal tenderness.  Musculoskeletal:        General: No edema.     Cervical back: Neck supple.  Neurological: He is alert and oriented to person, place, and time. He has intact cranial nerves (2-12).  Skin: Skin is warm and moist.  Abscess over the right scrotal sac currently nondraining.  Lab Results: Chemistry Recent Labs  Lab 05/17/23 0506 05/17/23 1201 05/18/23 4098 05/19/23 0209 05/19/23 0553 05/20/23 0229 05/21/23 0246  NA 137 138   < > 134* 132* 137 138  K 4.0 4.3   < > 3.3* 3.3* 3.3* 3.4*  CL 106 107   < > 97* 97* 101 99  CO2 11* 12*   < > 22 21* 21* 25  GLUCOSE 100* 86   < > 89 83 101* 107*  BUN 147* 150*   < > 71* 73* 83* 49*  CREATININE 21.61* 21.88*   < > 12.21* 12.36* 15.09* 11.51*  CALCIUM 7.6* 7.6*   < > 7.9* 7.8* 8.3* 8.3*  PROT 7.0 7.0  --   --   --   --   --   ALBUMIN 3.6 3.3*   < > 2.7*  --  2.7* 2.8*  AST 21  23  --   --   --   --   --   ALT 14 15  --   --   --   --   --   ALKPHOS 56 57  --   --   --   --   --   BILITOT 0.4 0.4  --   --   --   --   --   GFRNONAA 2* 2*   < > 5* 5* 4* 5*  ANIONGAP 20* 19*   < > 15 14 15 14    < > = values in this interval not displayed.    Hematology Recent Labs  Lab 05/20/23 0229 05/20/23 0659 05/21/23 0246  WBC 14.2* 14.0* 13.1*  RBC 2.80* 2.75* 2.94*  HGB 8.4* 8.3* 9.1*  HCT 25.4* 24.7* 27.8*  MCV 90.7 89.8 94.6  MCH 30.0 30.2 31.0  MCHC 33.1 33.6 32.7  RDW 14.2 14.6 14.3  PLT 196 192 195   High Sensitivity Troponin:   Recent Labs  Lab 05/17/23 0506 05/17/23 0802 05/17/23 1000  TROPONINIHS 4,584* 5,699* 5,441*     Cardiac EnzymesNo results for input(s): "TROPONINI" in the last 168 hours. No results for input(s): "TROPIPOC" in the last 168 hours.  BNP Recent Labs  Lab 05/17/23 0506  BNP 925.0*    DDimer  Recent Labs  Lab 05/17/23 0802  DDIMER 2.14*    Hemoglobin A1c:  Lab Results  Component Value Date    HGBA1C 5.8 (H) 05/17/2023   MPG 119.76 05/17/2023   TSH  Recent Labs    06/03/22 0000  TSH 2.90   Lipid Panel  Lab Results  Component Value Date   CHOL 102 05/18/2023   HDL 26 (L) 05/18/2023   LDLCALC 62 05/18/2023   LDLDIRECT 60 05/18/2023   TRIG 70 05/18/2023   CHOLHDL 3.9 05/18/2023   Drugs of Abuse     Component Value Date/Time   LABOPIA NONE DETECTED 05/17/2023 1124   COCAINSCRNUR NONE DETECTED 05/17/2023 1124   LABBENZ NONE DETECTED 05/17/2023 1124   AMPHETMU NONE DETECTED 05/17/2023 1124   THCU NONE DETECTED 05/17/2023 1124   LABBARB NONE DETECTED 05/17/2023 1124      Imaging: No results found.  CARDIAC DATABASE: EKG: 05/17/2023:. 6:52 AM: Sinus tachycardia, 114 bpm, TWI in the high lateral and lateral leads consider lateral ischemia, prolonged QT. 1157: Sinus tachycardia, 110 bpm, LVH per voltage criteria, ST-T changes likely secondary to LVH.   Echocardiogram: Per EMR: LVEF 15% in 2010. LVEF 25% in 2016. LVEF 30-35% 2019   05/17/2023  1. Left ventricular ejection fraction, by estimation, is 25%. The left  ventricle has severely decreased function. The left ventricle demonstrates  regional wall motion abnormalities (see scoring diagram/findings for  description). The left ventricular  internal cavity size was mildly dilated. There is moderate concentric left  ventricular hypertrophy. Left ventricular diastolic parameters are  indeterminate. Largely hypokinetic save for the apex.   2. Right ventricular systolic function is hyperdynamic. The right  ventricular size is normal.   3. Left atrial size was severely dilated.   4. The mitral valve is grossly normal. Mild mitral valve regurgitation.  No evidence of mitral stenosis.   5. The aortic valve was not well visualized. There is mild thickening of  the aortic valve. Aortic valve regurgitation is not visualized.   6. The inferior vena cava is normal in size with greater than 50%  respiratory  variability, suggesting right atrial pressure of 3 mmHg.  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)  Scheduled Meds:  amoxicillin-clavulanate  1 tablet Oral Q24H   ascorbic acid  250 mg Oral BID   aspirin  81 mg Oral Daily   atorvastatin  20 mg Oral Daily   carvedilol  12.5 mg Oral BID WC   Chlorhexidine Gluconate Cloth  6 each Topical Q0600   darbepoetin (ARANESP) injection - DIALYSIS  100 mcg Subcutaneous Q Fri-1800   ferric citrate  420 mg Oral TID WC   heparin  5,000 Units Subcutaneous Q8H   heparin sodium (porcine)       multivitamin  1 tablet Oral QHS   sacubitril-valsartan  1 tablet Oral BID   thiamine  100 mg Oral Daily    Continuous Infusions:  sodium chloride Stopped (05/18/23 0733)    PRN Meds: sodium chloride, acetaminophen, docusate sodium, famotidine, heparin sodium (porcine), iohexol, lip balm, ondansetron (ZOFRAN) IV, mouth rinse, polyethylene glycol   IMPRESSION & RECOMMENDATIONS: Zachary Lynch is a 37 y.o. 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   Impression: Nonischemic cardiomyopathy. NSTEMI-type II. Acute on chronic exacerbation of systolic and diastolic heart failure, stage C, NYHA class II/III. Chronic kidney disease stage V - initiated on dialysis 05/17/2023 Hypertensive crisis on admission-blood pressures improving. Hypertension  with CKD stage V with heart failure History of right MCA stroke in 2010 without residual deficits Scrotal infection  Recommendations: NSTEMI Acute on chronic exacerbation of systolic and diastolic heart failure, stage C, NYHA class II/III Nonischemic cardiomyopathy No longer experiences anginal discomfort and shortness of breath is essentially resolved. Currently on carvedilol, low-dose Entresto. Despite initiating Entresto patient is not hyperkalemic. Will start BiDil 20/37.5 mg p.o. 3 times daily - conveyed to primary team  Will arrange outpatient follow-up. He has an appointment scheduled with advanced heart failure clinic in the coming days to be establish care. Patient is aware that if and when he stops making urine medications like Entresto needs to be discontinued due to the risk of hyperkalemia.  He verbalized understanding. Remaining of the volume management per nephrology/dialysis.  Chronic kidney disease stage V: Status post tunneled catheter on 05/17/2023. Status post first hemodialysis session on 05/17/2023. Will be discharged after his current dialysis session. Nephrology following Net IO Since Admission: -2,294.36 mL [05/21/23 1633]  Hypertensive crisis on admission: Hypertension with CKD stage V with heart failure Blood pressures are slowly improving. Will add BiDil.  Medication profile discussed with the patient.   Medication changes as discussed above.  History of right MCA stroke in 2010: Reemphasized the importance of secondary prevention with focus on improving her modifiable cardiovascular risk factors such as glycemic control, lipid management, blood pressure control, weight loss. Recommend aspirin 81 mg p.o. daily and statin therapy  Scrotal infection: Management per primary team and surgery.  Patient's questions and concerns were addressed to his satisfaction. He voices understanding of the instructions provided during this encounter.   This note was  created using a voice recognition software  as a result there may be grammatical errors inadvertently enclosed that do not reflect the nature of this encounter. Every attempt is made to correct such errors.  Delilah Shan [facility]  Pager:  [phone] Office: [phone] 05/21/2023, 4:33 PM

## 2023-05-21 NOTE — Discharge Summary (Addendum)
DISCHARGE SUMMARY  Zachary Lynch  MR#: [id number]  DOB:11-05-1985  Date of Admission: 05/17/2023 Date of Discharge: 05/21/2023  Attending Physician:Kamri Gotsch Silvestre Gunner, MD  Patient's DGL:OVFIEP, Joy, NP  Disposition: D/C home   Follow-up Appts:  Follow-up Information     [facility]. Go in 5 day(s).   Specialty: Cardiology Why: hospital follow up please bring a list of current medications free valet parking, entrance C off [facility] manager information: [address] [place] [address] [phone]        Dialysis, [facility]. Go on 05/24/2023.   Why: Schedule is Tuesday, Thursday, Saturday with 10:00 chair time.  On Tuesday, please arrive at 9:15 to complete paperwork prior to treatment. Contact information: [address] [place] [facility] [address] [phone]         [name], NP Follow up in 7 day(s).   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  He initially presented to [facility] ED that same day with refractory nausea vomiting and dyspnea, as well as a scrotal abscess.   Significant  Events:  8/27 presented to [facility] ED - volume overloaded w/ NSTEMI - transfered to [facility] ICU 8/27 tunneled HD cath placed in IR at [facility] - 1st HD session 8/27 Gen Surg evaluated scrotal abscess >  no need for intervention  8/28 L/R heart cath - NICM - borderline PAH - 2nd HD session 8/30 [facility] assumed care 8/31 3rd HD session - cleared for d/c home   Hospital Course:  Type II NSTEMI Troponin peaked at approximately 5700 -care per Cardiology -no obstructive coronary disease per left heart cath 8/28 -TTE noted EF 35% with workup consistent with NICM -continue aspirin plus statin -to be followed by the advanced heart failure team as an outpatient   Acute exacerbation of chronic systolic CHF/NICM Care per Cardiology with medications currently being titrated - will need oupt f/u of meds/goal directed medical management    Hypertensive urgency Has responded well to ongoing dialysis treatment -required nitroglycerin drip initially which has been subsequently titrated off -continue BiDil - home amlodipine discontinued in setting of acutely decompensated CHF   Acute hypoxic respiratory failure due to pulmonary edema Corrected with diuresis and volume removal per dialysis with patient on room air at time of d/c home    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 Ongoing dialysis management per Nephrology - tunneled hemodialysis catheter placed in IR during this admission - Vascular Surgery has evaluated for more definitive long-term dialysis access but given his initial scrotal abscess and recent non-STEMI it is not felt that he will be a candidate for vascular access surgery for at least a  couple of weeks - to follow-up as outpatient under the guidance of Nephrology   Scrotal abscess No longer draining purulent fluid and is quite benign on exam at time of d/c  - ER attempted I&D at presentation but patient unable to tolerate -no overt signs of gas/necrotizing fasciitis -CT pelvis without evidence of  tracking -evaluated by General Surgery 8/27 with no acute surgical interventions indicated -continue broad antibiotic coverage to complete 10 days of tx    Nutrition Status: Nutrition Problem: Severe Malnutrition Etiology: chronic illness Signs/Symptoms: moderate fat depletion, energy intake < 75% for > or equal to 1 month, percent weight loss    Allergies as of 05/21/2023       Reactions   Ultram [tramadol] Nausea Only   Disorientation    Ventolin [albuterol] Other (See Comments)   Reaction is unknown. Allergy was entered post Stroke "caused him to have a stroke"         Medication List     STOP taking these medications    allopurinol 100 MG tablet Commonly known as: ZYLOPRIM   amLODipine 10 MG tablet Commonly known as: NORVASC   Clindamycin-Benzoyl Per (Refr) gel   doxazosin 2 MG tablet Commonly known as: CARDURA   famotidine 40 MG tablet Commonly known as: PEPCID   pantoprazole 40 MG tablet Commonly known as: PROTONIX   predniSONE 20 MG tablet Commonly known as: DELTASONE       TAKE these medications    acetaminophen 325 MG tablet Commonly known as: TYLENOL Take 2 tablets (650 mg total) by mouth every 4 (four) hours as needed for headache or mild pain.   amoxicillin-clavulanate 500-125 MG tablet Commonly known as: AUGMENTIN Take 1 tablet by mouth daily. On dialysis days, it is important that you take this medication AFTER your dialysis treatment (not before)   aspirin EC 81 MG tablet Commonly known as: Aspirin Low Dose Take 1 tablet (81 mg total) by mouth daily.   atorvastatin 20 MG tablet Commonly known as: LIPITOR Take 1 tablet (20 mg total) by mouth daily.   carvedilol 12.5 MG tablet Commonly known as: COREG Take 1 tablet (12.5 mg total) by mouth 2 (two) times daily with a meal. What changed:  medication strength how much to take   Darbepoetin Alfa 100 MCG/0.5ML Sosy injection Commonly known as: ARANESP Inject 0.5 mLs (100 mcg total)  into the skin every Friday at 6 PM. Start taking on: May 27, 2023   ferric citrate 1 GM 210 MG(Fe) tablet Commonly known as: AURYXIA Take 2 tablets (420 mg total) by mouth 3 (three) times daily with meals.   isosorbide-hydrALAZINE 20-37.5 MG tablet Commonly known as: BiDil Take 1 tablet by mouth 3 (three) times daily. DO NOT TAKE if your systolic blood pressure is < 100 or your heart rate is < 50   multivitamin Tabs tablet Take 1 tablet by mouth at bedtime.   sacubitril-valsartan 24-26 MG Commonly known as: ENTRESTO Take 1 tablet by mouth 2 (two) times daily.        Day of Discharge BP (!) 154/109   Pulse 93   Temp 98 F (36.7 C) (Oral)   Resp (!) 21   Ht 5\' 11"  (1.803 m)   Wt 100.1 kg   SpO2 100%   BMI 30.78 kg/m   Physical Exam: General: No acute respiratory distress Lungs: Clear to auscultation bilaterally without wheezes or crackles Cardiovascular: Regular rate and rhythm without murmur gallop or rub normal S1 and S2 Abdomen: Nontender, nondistended,  soft, bowel sounds positive, no rebound, no ascites, no appreciable mass Extremities: No significant cyanosis, clubbing, or edema bilateral lower extremities  Basic Metabolic Panel: Recent Labs  Lab 05/17/23 1201 05/18/23 0632 05/18/23 0906 05/18/23 1741 05/19/23 0209 05/19/23 0553 05/20/23 0229 05/21/23 0246  NA 138 137   < > 136 134* 132* 137 138  K 4.3 3.7   < > 3.8 3.3* 3.3* 3.3* 3.4*  CL 107 101  --  102 97* 97* 101 99  CO2 12* 17*  --  17* 22 21* 21* 25  GLUCOSE 86 85  --  115* 89 83 101* 107*  BUN 150* 113*  --  115* 71* 73* 83* 49*  CREATININE 21.88* 16.96*  --  17.42* 12.21* 12.36* 15.09* 11.51*  CALCIUM 7.6* 7.8*  --  7.9* 7.9* 7.8* 8.3* 8.3*  MG 2.0 1.8  --   --  2.1  --  2.3 1.9  PHOS 10.2* 7.1*  7.0*  --  7.7* 5.0*  --  6.9* 5.6*   < > = values in this interval not displayed.    CBC: Recent Labs  Lab 05/17/23 1201 05/18/23 4098 05/18/23 0906 05/18/23 0910 05/19/23 0209  05/20/23 0229 05/20/23 0659 05/21/23 0246  WBC 16.1* 14.3*  --   --  12.8* 14.2* 14.0* 13.1*  NEUTROABS 12.8*  --   --   --   --   --   --   --   HGB 10.7* 8.4*   < > 8.5* 8.3* 8.4* 8.3* 9.1*  HCT 32.7* 24.8*   < > 25.0* 24.2* 25.4* 24.7* 27.8*  MCV 94.2 87.9  --   --  88.0 90.7 89.8 94.6  PLT 294 239  --   --  223 196 192 195   < > = values in this interval not displayed.    Time spent in discharge (includes decision making & examination of pt): 35 minutes  05/21/2023, 4:31 PM   Lonia Blood, MD [facility] Office  [phone]

## 2023-05-23 ENCOUNTER — Telehealth: Payer: Self-pay

## 2023-05-23 NOTE — Transitions of Care (Post Inpatient/ED Visit) (Signed)
05/23/2023  Name: Zachary Lynch MRN: [id number] DOB: Jun 29, 1986  Today's TOC FU Call Status: Today's TOC FU Call Status:: Successful TOC FU Call Completed TOC FU Call Complete Date: 05/23/23 Patient's Name and Date of Birth confirmed.  Transition Care Management Follow-up Telephone Call Date of Discharge: 05/21/23 Discharge Facility: [facility]) Type of Discharge: Inpatient Admission Primary Inpatient Discharge Diagnosis:: acute kidney failure How have you been since you were released from the hospital?: Better Any questions or concerns?: No  Items Reviewed: Did you receive and understand the discharge instructions provided?: No Medications obtained,verified, and reconciled?: Yes (Medications Reviewed) Any new allergies since your discharge?: Yes Dietary orders reviewed?: Yes Do you have support at home?: Yes People in Home: spouse  Medications Reviewed Today: Medications Reviewed Today     Reviewed by Karena Addison, LPN (Licensed Practical Nurse) on 05/23/23 at 1517  Med List Status: <None>   Medication Order Taking? Sig Documenting Provider Last Dose Status Informant  acetaminophen (TYLENOL) 325 MG tablet [id number]  Take 2 tablets (650 mg total) by mouth every 4 (four) hours as needed for headache or mild pain. Lonia Blood, MD  Active   amoxicillin-clavulanate (AUGMENTIN) 500-125 MG tablet [id number]  Take 1 tablet by mouth daily. On dialysis days, it is important that you take this medication AFTER your dialysis treatment (not before) Lonia Blood, MD  Active   aspirin EC (ASPIRIN LOW DOSE) 81 MG tablet [id number]  Take 1 tablet (81 mg total) by mouth daily. Lonia Blood, MD  Active   atorvastatin (LIPITOR) 20 MG tablet [id number]  Take 1 tablet (20 mg total) by mouth daily. Lonia Blood, MD  Active   carvedilol (COREG) 12.5 MG tablet [id number]  Take 1 tablet (12.5 mg total) by mouth 2 (two) times daily with a meal. Lonia Blood, MD  Active    Darbepoetin Alfa (ARANESP) 100 MCG/0.5ML SOSY injection [id number]  Inject 0.5 mLs (100 mcg total) into the skin every Friday at 6 PM. Lonia Blood, MD  Active   ferric citrate (AURYXIA) 1 GM 210 MG(Fe) tablet [id number]  Take 2 tablets (420 mg total) by mouth 3 (three) times daily with meals. Lonia Blood, MD  Active   isosorbide-hydrALAZINE (BIDIL) 20-37.5 MG tablet [id number]  Take 1 tablet by mouth 3 (three) times daily. DO NOT TAKE if your systolic blood pressure is < 100 or your heart rate is < 50 Lonia Blood, MD  Active   multivitamin (RENA-VIT) TABS tablet [id number]  Take 1 tablet by mouth at bedtime. Lonia Blood, MD  Active   sacubitril-valsartan (ENTRESTO) 24-26 [place] [id number]  Take 1 tablet by mouth 2 (two) times daily. Lonia Blood, MD  Active             Home Care and Equipment/Supplies: Were Home Health Services Ordered?: NA Any new equipment or medical supplies ordered?: NA  Functional Questionnaire: Do you need assistance with bathing/showering or dressing?: No Do you need assistance with meal preparation?: No Do you need assistance with eating?: No Do you have difficulty maintaining continence: No Do you need assistance with getting out of bed/getting out of a chair/moving?: No Do you have difficulty managing or taking your medications?: No  Follow up appointments reviewed: PCP Follow-up appointment confirmed?: Yes Date of PCP follow-up appointment?: 05/30/23 Follow-up Provider: [facility] Follow-up appointment confirmed?: NA Do you need transportation to your follow-up appointment?: No Do you understand care options if your condition(s) worsen?:  Yes-patient verbalized understanding    SIGNATURE Karena Addison, LPN [facility] Nurse Health Advisor Direct Dial [phone]

## 2023-05-24 NOTE — Progress Notes (Signed)
Late Note Entry- May 24, 2023  Pt was d/c on Saturday. Contacted [facility] this morning to advise clinic of pt's d/c date and that pt should start today as planned. D/C summary and last renal note faxed to clinic for continuation of care.   Olivia Canter Renal Navigator  [phone]

## 2023-05-26 NOTE — Progress Notes (Signed)
HEART & VASCULAR TRANSITION OF CARE CONSULT NOTE    Referring Physician: Dr. Odis Hollingshead Primary Care: [name], NP Primary Cardiologist: Dr. Odis Hollingshead HF Cardiologist: assign to Dr. Elwyn Lade.  HPI: Referred to clinic by Dr. Odis Hollingshead for heart failure consultation.   Zachary Lynch is a 37 y.o. 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 Initially followed Dr. Lynnea Ferrier at [facility], transiently wore a LifeVest and then saw Dr. Diona Browner in 2016. Has not seen a cardiologist since. Referred back to [facility] by Gena Fray PA-C for follow-up of his HF in 2019.      Echo (2010): EF 15%.   Echo (2016): EF 25%.   Initial visit Dr. Gala Romney 05/2018. GDMT titrated. Echo (9/19): EF 30-35%. Lost to follow up due to financial concerns.  Admitted 8/24 with a/c HF, off all meds x 3 months due to finances. HsTroponins > 5k (no CP, in setting of CKD IV) and creatinine 21.8. Had not seen Nephrology since 2022. ECG showed ST with subtle TWI. Echo showed EF 25%, moderate eccentric LVH, RV hyperdynamic. Underwent TDC placement, received dialysis on 05/17/23, and then underwent R/LHC showing mildly elevated filling pressures, borderline pulmonary hypertension, and no obstructive CAD. He continued to make some urine and was continued on Entresto. Hospitalization c/b scrotal abscess, general surgery consulted and recommended supportive care. Outpatient iHD arranged and he was discharged home, weight 220 lbs.  Today he presents to [facility] for post hospital follow up. Overall feeling fine. No SOB with activity. Pulled too much at dialysis this past week, and BP dropped, recovered with fluids and rest. Otherwise no issues with BP. Occasional positional dizziness. Denies palpitations, CP, edema, or PND/Orthopnea. Chronically sleeps reclined due to GERD.  Appetite ok. No fever or chills. Weight at home 203-204 lbs. Taking all  medications. 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 No tobacco/drugs/ETOH.  Family Hx: Mother with MI (died at 95), HTN, father with HTN  Cardiac Testing   - R/LHC (8/24): Right dominant circulation, calcified and ectatic vessels with minimal irregularities, no obstructive CAD RA 5, PA 29/19 (23), PCWP 17, CO/CI (Fick) 9.4/4.3  - Echo (8/24): EF 25%, moderate eccentric LVH, RV hyperdynamic.   - Echo (2016): EF 25%  - Echo (2010): EF 15%  Review of Systems: [y] = yes, [ ]  = no   General: Weight gain [ ] ; Weight loss [ ] ; Anorexia [ ] ; Fatigue [ ] ; Fever [ ] ; Chills [ ] ; Weakness [ ]   Cardiac: Chest pain/pressure [ ] ; Resting SOB [ ] ; Exertional SOB [ ] ; Orthopnea [ ] ; Pedal Edema [ ] ; Palpitations [ ] ; Syncope [ ] ; Presyncope [ ] ; Paroxysmal nocturnal dyspnea[ ]   Pulmonary: Cough [ ] ; Wheezing[ ] ; Hemoptysis[ ] ; Sputum [ ] ; Snoring [ ]   GI: Vomiting[ ] ; Dysphagia[ ] ; Melena[ ] ; Hematochezia [ ] ; Heartburn[ ] ; Abdominal pain [ ] ; Constipation [ ] ; Diarrhea [ ] ; BRBPR [ ]   GU: Hematuria[ ] ; Dysuria [ ] ; Nocturia[ ]   Vascular: Pain in legs with walking [ ] ; Pain in feet with lying flat [ ] ; Non-healing sores [ ] ; Stroke [ ] ; TIA [ ] ; Slurred speech [ ] ;  Neuro: Headaches[ ] ; Vertigo[ ] ; Seizures[ ] ; Paresthesias[ ] ;Blurred vision [ ] ; Diplopia [ ] ; Vision changes [ ]   Ortho/Skin: Arthritis [ ] ;  Joint pain [ ] ; Muscle pain [ ] ; Joint swelling [ ] ; Back Pain [ ] ; Rash [ ]   Psych: Depression[ ] ; Anxiety[ ]   Heme: Bleeding problems [ ] ; Clotting disorders [ ] ; Anemia [ ]   Endocrine: Diabetes [ ] ; Thyroid dysfunction[ ]   Past Medical History:  Diagnosis Date   Arthritis    CKD stage 4 secondary to hypertension (HCC) 03/28/2017   Elevated blood uric acid level    Essential hypertension    Folliculitis    Managed with doxycycline, topical clindamycin/benzoyl peroxide   GERD (gastroesophageal reflux disease)     Gout    HFrEF (heart failure with reduced ejection fraction) (HCC)    Echo 2019 with EF 30-35%, hypokinesis   History of cardiomyopathy    LVEF 15% in 2010 - evaluated by [facility] and Dr. Graciela Husbands   History of stroke 2010   Right MCA distribution infarct, felt to be possibly embolic in the setting of cardiomyopathy - placed on Coumadin at that time   Hyperlipidemia    Nonischemic cardiomyopathy (HCC) 03/28/2017   Obesity    Prediabetes 05/03/2017   Current Outpatient Medications  Medication Sig Dispense Refill   acetaminophen (TYLENOL) 325 MG tablet Take 2 tablets (650 mg total) by mouth every 4 (four) hours as needed for headache or mild pain.     amoxicillin-clavulanate (AUGMENTIN) 500-125 MG tablet Take 1 tablet by mouth daily. On dialysis days, it is important that you take this medication AFTER your dialysis treatment (not before) 6 tablet 0   aspirin EC (ASPIRIN LOW DOSE) 81 MG tablet Take 1 tablet (81 mg total) by mouth daily.     atorvastatin (LIPITOR) 20 MG tablet Take 1 tablet (20 mg total) by mouth daily. 90 tablet 0   carvedilol (COREG) 12.5 MG tablet Take 1 tablet (12.5 mg total) by mouth 2 (two) times daily with a meal. 60 tablet 0   Darbepoetin Alfa (ARANESP) 100 MCG/0.5ML SOSY injection Inject 0.5 mLs (100 mcg total) into the skin every Friday at 6 PM.     ferric citrate (AURYXIA) 1 GM 210 MG(Fe) tablet Take 2 tablets (420 mg total) by mouth 3 (three) times daily with meals. 180 tablet 0   isosorbide-hydrALAZINE (BIDIL) 20-37.5 MG tablet Take 1 tablet by mouth 3 (three) times daily. DO NOT TAKE if your systolic blood pressure is < 100 or your heart rate is < 50 90 tablet 0   multivitamin (RENA-VIT) TABS tablet Take 1 tablet by mouth at bedtime. 30 tablet 0   sacubitril-valsartan (ENTRESTO) 24-26 MG Take 1 tablet by mouth 2 (two) times daily. 60 tablet 0   No current facility-administered medications for this encounter.    Allergies  Allergen Reactions   Ultram [Tramadol]  Nausea Only    Disorientation    Ventolin [Albuterol] Other (See Comments)    Reaction is unknown. Allergy was entered post Stroke "caused him to have a stroke"     Social History   Socioeconomic History   Marital status: Single    Spouse name: Not on file   Number of children: Not on file   Years of education: Not on file   Highest education level: Associate degree: occupational, Scientist, product/process development, or vocational program  Occupational History   Occupation: Print production planner  Tobacco Use   Smoking status: Never   Smokeless tobacco: Never  Vaping Use   Vaping status: Never Used  Substance and Sexual Activity   Alcohol use: No    Alcohol/week: 0.0 standard drinks of alcohol  Drug use: No   Sexual activity: Not Currently  Other Topics Concern   Not on file  Social History Narrative   Not on file   Social Determinants of Health   Financial Resource Strain: Low Risk  (05/19/2023)   Overall Financial Resource Strain (CARDIA)    Difficulty of Paying Living Expenses: Not hard at all  Food Insecurity: No Food Insecurity (05/19/2023)   Hunger Vital Sign    Worried About Running Out of Food in the Last Year: Never true    Ran Out of Food in the Last Year: Never true  Transportation Needs: No Transportation Needs (05/19/2023)   PRAPARE - Administrator, Civil Service (Medical): No    Lack of Transportation (Non-Medical): No  Physical Activity: Unknown (01/13/2023)   Exercise Vital Sign    Days of Exercise per Week: 0 days    Minutes of Exercise per Session: Not on file  Stress: Stress Concern Present (01/13/2023)   [employer] of Occupational Health - Occupational Stress Questionnaire    Feeling of Stress : To some extent  Social Connections: Moderately Integrated (01/13/2023)   Social Connection and Isolation Panel [NHANES]    Frequency of Communication with Friends and Family: More than three times a week    Frequency of Social Gatherings with Friends and Family: Twice a  week    Attends Religious Services: More than 4 times per year    Active Member of [employer] or Organizations: Yes    Attends Engineer, structural: More than 4 times per year    Marital Status: Never married  Intimate Partner Violence: Not At Risk (05/19/2023)   Humiliation, Afraid, Rape, and Kick questionnaire    Fear of Current or Ex-Partner: No    Emotionally Abused: No    Physically Abused: No    Sexually Abused: No   Family History  Problem Relation Age of Onset   Stroke Cousin    Hypertension Mother    Heart attack Mother        Died age 46   Hypertension Father    BP (!) 130/98   Pulse 81   Wt 104.3 kg (230 lb)   SpO2 100%   BMI 32.08 kg/m   Wt Readings from Last 3 Encounters:  05/30/23 104.3 kg (230 lb)  05/21/23 100.1 kg (220 lb 10.9 oz)  01/14/23 113.6 kg (250 lb 8 oz)   PHYSICAL EXAM: General:  NAD. No resp difficulty, walked into clinic HEENT: Normal Neck: Supple. No JVD. Carotids 2+ bilat; no bruits. No lymphadenopathy or thryomegaly appreciated. Cor: PMI nondisplaced. Regular rate & rhythm. No rubs, gallops or murmurs. Lungs: Clear, TDC looks ok Abdomen: Soft, obese, nontender, nondistended. No hepatosplenomegaly. No bruits or masses. Good bowel sounds. Extremities: No cyanosis, clubbing, rash, edema Neuro: Alert & oriented x 3, cranial nerves grossly intact. Moves all 4 extremities w/o difficulty. Affect pleasant.  ECG (personally reviewed): NSR 74 bpm, LVH  ASSESSMENT & PLAN: Chronic Systolic Heart Failure - NICM, HTN-CM - Echo (2010): EF 10%, post CVA - Echo (2016): EF 25% - Echo (8/24): EF 25%, moderate eccentric LVH, RV hyperdynamic.  - R/LHC (8/24): no obstructive CAD; RA 5, PA 29/19 (23), PCWP 17, CO/CI (Fick) 9.4/4.3 - NYHA II, volume management per iHD. - Continue Entresto 24/26 mg bid. He continues to make urine, OK to continue as long as no HyperK Patient assistance grant started. - Continue Coreg 12.5 mg bid. - Stop BiDil. Start  hydralazine 25 mg tid +  Imdur 30 mg daily (HF fund preference) - Repeat echo in 3 months. EF has been down x years. If EF remains < 35%, consider referral to EP for ICD, narrow QRS. - May be a candidate for heart + kidney down. Will need CPX at some point.   ESRD - Dialyzing TDC, 1st HD session 05/17/23. - TTS schedule ([facility]). - VVS to evaluate for AVF/graft down the road.  HTN - BP controlled - GDMT as above - If BP drops around dialysis, would pull back on beta blocker first.  Obesity - Body mass index is 32.08 kg/m. - Continue weight loss efforts  5. SDOH - He lives with his aunt - Insurance thru work starts 10/24 - Cardiac meds sent to [facility] pharm for HF fund - [name] started for [employer]  NYHA II GDMT  Diuretic: n/a BB: Coreg 25 mg bid Ace/ARB/ARNI: Sherryll Burger 24/26 bid MRA: n/a, ESRD SGLT2i: n/a, ESRD  Referred to HFSW (PCP, Medications, Transportation, ETOH Abuse, Drug Abuse, Insurance, Financial ): No Refer to Pharmacy: No Refer to Home Health: No Refer to Advanced Heart Failure Clinic: Yes, assign to Dr. Elwyn Lade Refer to General Cardiology: No, shared with Dr. Odis Hollingshead  Refer back to AHF clinic. Follow up in 4-6 weeks with AHF APP and 3 months with Dr. Elwyn Lade + echo.   Prince Rome, FNP-BC 05/30/23

## 2023-05-27 ENCOUNTER — Encounter: Payer: PRIVATE HEALTH INSURANCE | Admitting: Internal Medicine

## 2023-05-27 NOTE — Progress Notes (Deleted)
The catheter was flushed with appropriate volume heparin dwells. The catheter exit site was secured with a 0-Prolene retention suture. The venotomy incision was closed with Dermabond. Dressings were applied. The patient tolerated the procedure well without immediate post procedural complication. IMPRESSION: Successful placement of 23 cm tip to cuff tunneled hemodialysis catheter via the RIGHT internal jugular vein The tip of the catheter is positioned within the proximal RIGHT atrium. The catheter is ready for immediate use. Zachary Banning, MD [facility] Electronically Signed   By: Zachary Lynch M.D.   On: 05/18/2023 10:16   CARDIAC CATHETERIZATION  Addendum Date: 05/18/2023   Left and right heart catheterization 05/18/2023: Right dominant circulation Calcified and ectatic vessels with minimal irregularities No obstructive coronary artery disease RA: 5 mmHg RV: 39/5 mmHg PA: 29/19 mmHg, mPAP 23 mmHg PCW: 17 mmHg LV: 139/1 mmHg, LVEDP 29 mmHg CO: 9.4 L/min CI: 4.3 L/min/m2 Nonischemic cardiomyopathy Mild decompensation Borderline pulmonary hypertension (WHO Grp II) Zachary Negus, MD Pager: [phone] Office: [phone]   Result Date: 05/18/2023 Images from the original result were not included. Left and right heart catheterization 05/18/2023: Right dominant circulation Calcified and ectatic vessels with minimal irregularities No obstructive coronary artery disease RA: 5 mmHg RV: 39/5 mmHg PA: 29/19 mmHg, mPAP 23 mmHg PCW: 17 mmHg LV:  139/1 mmHg, LVEDP 29 mmHg CO: 9.4 L/min CI: 4.3 L/min/m2 Zachary Negus, MD Pager: [phone] Office: [phone]  DG Chest Port 1 View  Result Date: 05/18/2023 CLINICAL DATA:  Pulmonary edema. EXAM: PORTABLE CHEST 1 VIEW COMPARISON:  May 17, 2023. FINDINGS: Stable cardiomediastinal silhouette. Right internal jugular catheter is noted with tip in expected position of cavoatrial junction. Both lungs are clear. Subacute to old left clavicular fracture is noted. IMPRESSION: No active disease. Electronically Signed   By: Zachary Lynch M.D.   On: 05/18/2023 07:50   ECHOCARDIOGRAM COMPLETE  Result Date: 05/17/2023    ECHOCARDIOGRAM REPORT   Patient Name:   Zachary Lynch [facility] Date of Exam: 05/17/2023 Medical Rec #:  [id number]    Height:       71.0 in Accession #:    [id number]   Weight:       220.0 lb Date of Birth:  [birth year]     BSA:          2.196 m Patient Age:    37 years     BP:           122/104 mmHg Patient Gender: M            HR:           120 bpm. Exam Location:  Inpatient Procedure: 2D Echo, Cardiac Doppler, Color Doppler and Intracardiac            Opacification Agent Indications:    CHF  History:        Patient has prior history of Echocardiogram examinations, most                 recent 06/06/2018. Cardiomyopathy, Stroke; Risk Factors:Obesity,                 Dyslipidemia and Hypertension. CKD.  Sonographer:    Zachary Lynch Referring Phys: [id number] Zachary Lynch IMPRESSIONS  1. Left ventricular ejection fraction, by estimation, is 25%. The left ventricle has severely decreased function. The left ventricle demonstrates regional wall motion abnormalities (see scoring diagram/findings for description). The left ventricular internal cavity size was mildly dilated. There is moderate concentric  Office Visit Note  Patient: Zachary Lynch             Date of Birth: December 16, 1985           MRN: [id number]             PCP: Zachary Butter, NP Referring: Zachary Butter, NP Visit Date: 05/27/2023 Occupation: @GUAROCC @  Subjective:  No chief complaint on file.   History of Present Illness: Zachary Lynch is a 37 y.o. male ***   Labs reviewed 12/2022 Uric acid 8.8 eGFR 3 Lipase 343  Activities of Daily Living:  Patient reports morning stiffness for *** {minute/hour:19697}.   Patient {ACTIONS;DENIES/REPORTS:21021675::"Denies"} nocturnal pain.  Difficulty dressing/grooming: {ACTIONS;DENIES/REPORTS:21021675::"Denies"} Difficulty climbing stairs: {ACTIONS;DENIES/REPORTS:21021675::"Denies"} Difficulty getting out of chair: {ACTIONS;DENIES/REPORTS:21021675::"Denies"} Difficulty using hands for taps, buttons, cutlery, and/or writing: {ACTIONS;DENIES/REPORTS:21021675::"Denies"}  No Rheumatology ROS completed.   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) 05/17/2023   Dyspnea on exertion 05/17/2023   Prolonged Q-T interval on ECG 05/17/2023   Hypomagnesemia 05/17/2023   Leukocytosis 05/17/2023   Scrotal abscess 05/17/2023   History of ischemic right MCA stroke 05/17/2023   Hypertensive crisis 05/17/2023   Acute on chronic HFrEF (heart failure with reduced ejection fraction) (HCC) 05/17/2023   Non-ST elevation (NSTEMI) myocardial infarction (HCC) 05/17/2023   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) 06/03/2022   Acute gout due to renal impairment involving left elbow 06/03/2022   Acute gout due to renal impairment involving right  foot 05/07/2020   Hypertensive kidney disease with CKD stage IV (HCC) 01/23/2019   Noncompliance with treatment plan 10/28/2017   Encounter for monitoring statin therapy 10/28/2017   Chronic systolic heart failure (HCC) 10/28/2017   Acute renal failure superimposed on stage 5 chronic kidney disease, not on chronic dialysis (HCC) 05/04/2017   Uncontrolled stage 2 hypertension 05/04/2017   Prediabetes 05/03/2017   Elevated blood uric acid level 05/03/2017   Dyslipidemia, goal LDL below 100 05/03/2017   History of stroke 03/28/2017   CKD (chronic kidney disease) stage 4, GFR 15-29 ml/min (HCC) 03/28/2017   Gastroesophageal reflux disease 03/28/2017   Nonischemic cardiomyopathy (HCC) 03/28/2017   Class 3 severe obesity due to excess calories with serious comorbidity and body mass index (BMI) of 40.0 to 44.9 in adult (HCC) 03/28/2017   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 Graciela Husbands   History of stroke 2010   Right MCA distribution infarct, felt to be possibly embolic in the setting of cardiomyopathy - placed on Coumadin at that time   Hyperlipidemia    Nonischemic cardiomyopathy (HCC) 03/28/2017   Obesity  Office Visit Note  Patient: Zachary Lynch             Date of Birth: December 16, 1985           MRN: [id number]             PCP: Zachary Butter, NP Referring: Zachary Butter, NP Visit Date: 05/27/2023 Occupation: @GUAROCC @  Subjective:  No chief complaint on file.   History of Present Illness: Zachary Lynch is a 37 y.o. male ***   Labs reviewed 12/2022 Uric acid 8.8 eGFR 3 Lipase 343  Activities of Daily Living:  Patient reports morning stiffness for *** {minute/hour:19697}.   Patient {ACTIONS;DENIES/REPORTS:21021675::"Denies"} nocturnal pain.  Difficulty dressing/grooming: {ACTIONS;DENIES/REPORTS:21021675::"Denies"} Difficulty climbing stairs: {ACTIONS;DENIES/REPORTS:21021675::"Denies"} Difficulty getting out of chair: {ACTIONS;DENIES/REPORTS:21021675::"Denies"} Difficulty using hands for taps, buttons, cutlery, and/or writing: {ACTIONS;DENIES/REPORTS:21021675::"Denies"}  No Rheumatology ROS completed.   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) 05/17/2023   Dyspnea on exertion 05/17/2023   Prolonged Q-T interval on ECG 05/17/2023   Hypomagnesemia 05/17/2023   Leukocytosis 05/17/2023   Scrotal abscess 05/17/2023   History of ischemic right MCA stroke 05/17/2023   Hypertensive crisis 05/17/2023   Acute on chronic HFrEF (heart failure with reduced ejection fraction) (HCC) 05/17/2023   Non-ST elevation (NSTEMI) myocardial infarction (HCC) 05/17/2023   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) 06/03/2022   Acute gout due to renal impairment involving left elbow 06/03/2022   Acute gout due to renal impairment involving right  foot 05/07/2020   Hypertensive kidney disease with CKD stage IV (HCC) 01/23/2019   Noncompliance with treatment plan 10/28/2017   Encounter for monitoring statin therapy 10/28/2017   Chronic systolic heart failure (HCC) 10/28/2017   Acute renal failure superimposed on stage 5 chronic kidney disease, not on chronic dialysis (HCC) 05/04/2017   Uncontrolled stage 2 hypertension 05/04/2017   Prediabetes 05/03/2017   Elevated blood uric acid level 05/03/2017   Dyslipidemia, goal LDL below 100 05/03/2017   History of stroke 03/28/2017   CKD (chronic kidney disease) stage 4, GFR 15-29 ml/min (HCC) 03/28/2017   Gastroesophageal reflux disease 03/28/2017   Nonischemic cardiomyopathy (HCC) 03/28/2017   Class 3 severe obesity due to excess calories with serious comorbidity and body mass index (BMI) of 40.0 to 44.9 in adult (HCC) 03/28/2017   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 Graciela Husbands   History of stroke 2010   Right MCA distribution infarct, felt to be possibly embolic in the setting of cardiomyopathy - placed on Coumadin at that time   Hyperlipidemia    Nonischemic cardiomyopathy (HCC) 03/28/2017   Obesity  Office Visit Note  Patient: Zachary Lynch             Date of Birth: December 16, 1985           MRN: [id number]             PCP: Zachary Butter, NP Referring: Zachary Butter, NP Visit Date: 05/27/2023 Occupation: @GUAROCC @  Subjective:  No chief complaint on file.   History of Present Illness: Zachary Lynch is a 37 y.o. male ***   Labs reviewed 12/2022 Uric acid 8.8 eGFR 3 Lipase 343  Activities of Daily Living:  Patient reports morning stiffness for *** {minute/hour:19697}.   Patient {ACTIONS;DENIES/REPORTS:21021675::"Denies"} nocturnal pain.  Difficulty dressing/grooming: {ACTIONS;DENIES/REPORTS:21021675::"Denies"} Difficulty climbing stairs: {ACTIONS;DENIES/REPORTS:21021675::"Denies"} Difficulty getting out of chair: {ACTIONS;DENIES/REPORTS:21021675::"Denies"} Difficulty using hands for taps, buttons, cutlery, and/or writing: {ACTIONS;DENIES/REPORTS:21021675::"Denies"}  No Rheumatology ROS completed.   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) 05/17/2023   Dyspnea on exertion 05/17/2023   Prolonged Q-T interval on ECG 05/17/2023   Hypomagnesemia 05/17/2023   Leukocytosis 05/17/2023   Scrotal abscess 05/17/2023   History of ischemic right MCA stroke 05/17/2023   Hypertensive crisis 05/17/2023   Acute on chronic HFrEF (heart failure with reduced ejection fraction) (HCC) 05/17/2023   Non-ST elevation (NSTEMI) myocardial infarction (HCC) 05/17/2023   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) 06/03/2022   Acute gout due to renal impairment involving left elbow 06/03/2022   Acute gout due to renal impairment involving right  foot 05/07/2020   Hypertensive kidney disease with CKD stage IV (HCC) 01/23/2019   Noncompliance with treatment plan 10/28/2017   Encounter for monitoring statin therapy 10/28/2017   Chronic systolic heart failure (HCC) 10/28/2017   Acute renal failure superimposed on stage 5 chronic kidney disease, not on chronic dialysis (HCC) 05/04/2017   Uncontrolled stage 2 hypertension 05/04/2017   Prediabetes 05/03/2017   Elevated blood uric acid level 05/03/2017   Dyslipidemia, goal LDL below 100 05/03/2017   History of stroke 03/28/2017   CKD (chronic kidney disease) stage 4, GFR 15-29 ml/min (HCC) 03/28/2017   Gastroesophageal reflux disease 03/28/2017   Nonischemic cardiomyopathy (HCC) 03/28/2017   Class 3 severe obesity due to excess calories with serious comorbidity and body mass index (BMI) of 40.0 to 44.9 in adult (HCC) 03/28/2017   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 Graciela Husbands   History of stroke 2010   Right MCA distribution infarct, felt to be possibly embolic in the setting of cardiomyopathy - placed on Coumadin at that time   Hyperlipidemia    Nonischemic cardiomyopathy (HCC) 03/28/2017   Obesity  Prediabetes 05/03/2017    Family History  Problem Relation Age of Onset   Stroke Cousin    Hypertension Mother    Heart attack Mother        Died age 52   Hypertension Father    Past Surgical History:  Procedure Laterality Date   IR FLUORO GUIDE CV LINE RIGHT   05/17/2023   IR US GUIDE VASC ACCESS RIGHT  05/17/2023   RIGHT/LEFT HEART CATH AND CORONARY ANGIOGRAPHY N/A 05/18/2023   Procedure: RIGHT/LEFT HEART CATH AND CORONARY ANGIOGRAPHY;  Surgeon: Zachary Negus, MD;  Location: [facility] INVASIVE [facility];  Service: Cardiovascular;  Laterality: N/A;   Social History   Social History Narrative   Not on file   Immunization History  Administered Date(s) Administered   PFIZER(Purple Top)SARS-COV-2 Vaccination 10/30/2021   Tdap 06/03/2022     Objective: Vital Signs: There were no vitals taken for this visit.   Physical Exam   Musculoskeletal Exam: ***  CDAI Exam: CDAI Score: -- Patient Global: --; Provider Global: -- Swollen: --; Tender: -- Joint Exam 05/27/2023   No joint exam has been documented for this visit   There is currently no information documented on the homunculus. Go to the Rheumatology activity and complete the homunculus joint exam.  Investigation: No additional findings.  Imaging: VAS [place] UPPER EXT VEIN MAPPING (PRE-OP AVF)  Result Date: 05/18/2023 UPPER EXTREMITY VEIN MAPPING Patient Name:  Zachary [facility]  Date of Exam:   05/18/2023 Medical Rec #: [id number]     Accession #:    [id number] Date of Birth: Feb 03, 1986      Patient Gender: M Patient Age:   15 years Exam Location:  [facility] Procedure:      VAS [place] UPPER EXT VEIN MAPPING (PRE-OP AVF) Referring Phys: Crista [facility] --------------------------------------------------------------------------------  Indications: Pre-access. History: CKD Stage 4.  Performing Technologist: Shona Simpson  Examination Guidelines: A complete evaluation includes B-mode imaging, spectral Doppler, color Doppler, and power Doppler as needed of all accessible portions of each vessel. Bilateral testing is considered an integral part of a complete examination. Limited examinations for reoccurring indications may be performed as noted. +-----------------+-------------+----------+---------+ Right  Cephalic   Diameter (cm)Depth (cm)Findings  +-----------------+-------------+----------+---------+ Shoulder             0.26        0.78             +-----------------+-------------+----------+---------+ Prox upper arm       0.39        0.58             +-----------------+-------------+----------+---------+ Mid upper arm        0.31        0.23             +-----------------+-------------+----------+---------+ Dist upper arm       0.23        0.37             +-----------------+-------------+----------+---------+ Antecubital fossa    0.25        0.23             +-----------------+-------------+----------+---------+ Prox forearm         0.34        0.48             +-----------------+-------------+----------+---------+ Mid forearm          0.31        0.39   branching +-----------------+-------------+----------+---------+ Dist forearm  Prediabetes 05/03/2017    Family History  Problem Relation Age of Onset   Stroke Cousin    Hypertension Mother    Heart attack Mother        Died age 52   Hypertension Father    Past Surgical History:  Procedure Laterality Date   IR FLUORO GUIDE CV LINE RIGHT   05/17/2023   IR US GUIDE VASC ACCESS RIGHT  05/17/2023   RIGHT/LEFT HEART CATH AND CORONARY ANGIOGRAPHY N/A 05/18/2023   Procedure: RIGHT/LEFT HEART CATH AND CORONARY ANGIOGRAPHY;  Surgeon: Zachary Negus, MD;  Location: [facility]VAS[facility];  Service: Cardiovascular;  Laterality: N/A;   Social History   Social History Narrative   Not on file   Immunization History  Administered Date(s) Administered   PFIZER(Purple Top)SARS-COV-2 Vaccination 10/30/2021   Tdap 06/03/2022     Objective: Vital Signs: There were no vitals taken for this visit.   Physical Exam   Musculoskeletal Exam: ***  CDAI Exam: CDAI Score: -- Patient Global: --; Provider Global: -- Swollen: --; Tender: -- Joint Exam 05/27/2023   No joint exam has been documented for this visit   There is currently no information documented on the homunculus. Go to the Rheumatology activity and complete the homunculus joint exam.  Investigation: No additional findings.  Imaging: VAS [place] UPPER EXT VEIN MAPPING (PRE-OP AVF)  Result Date: 05/18/2023 UPPER EXTREMITY VEIN MAPPING Patient Name:  Zachary [facility]  Date of Exam:   05/18/2023 Medical Rec #: [id number]     Accession #:    [id number] Date of Birth: Feb 03, 1986      Patient Gender: M Patient Age:   15 years Exam Location:  [facility] Procedure:      VAS [place] UPPER EXT VEIN MAPPING (PRE-OP AVF) Referring Phys: Crista [facility] --------------------------------------------------------------------------------  Indications: Pre-access. History: CKD Stage 4.  Performing Technologist: Shona Simpson  Examination Guidelines: A complete evaluation includes B-mode imaging, spectral Doppler, color Doppler, and power Doppler as needed of all accessible portions of each vessel. Bilateral testing is considered an integral part of a complete examination. Limited examinations for reoccurring indications may be performed as noted. +-----------------+-------------+----------+---------+ Right  Cephalic   Diameter (cm)Depth (cm)Findings  +-----------------+-------------+----------+---------+ Shoulder             0.26        0.78             +-----------------+-------------+----------+---------+ Prox upper arm       0.39        0.58             +-----------------+-------------+----------+---------+ Mid upper arm        0.31        0.23             +-----------------+-------------+----------+---------+ Dist upper arm       0.23        0.37             +-----------------+-------------+----------+---------+ Antecubital fossa    0.25        0.23             +-----------------+-------------+----------+---------+ Prox forearm         0.34        0.48             +-----------------+-------------+----------+---------+ Mid forearm          0.31        0.39   branching +-----------------+-------------+----------+---------+ Dist forearm  Office Visit Note  Patient: Zachary Lynch             Date of Birth: December 16, 1985           MRN: [id number]             PCP: Zachary Butter, NP Referring: Zachary Butter, NP Visit Date: 05/27/2023 Occupation: @GUAROCC @  Subjective:  No chief complaint on file.   History of Present Illness: Zachary Lynch is a 37 y.o. male ***   Labs reviewed 12/2022 Uric acid 8.8 eGFR 3 Lipase 343  Activities of Daily Living:  Patient reports morning stiffness for *** {minute/hour:19697}.   Patient {ACTIONS;DENIES/REPORTS:21021675::"Denies"} nocturnal pain.  Difficulty dressing/grooming: {ACTIONS;DENIES/REPORTS:21021675::"Denies"} Difficulty climbing stairs: {ACTIONS;DENIES/REPORTS:21021675::"Denies"} Difficulty getting out of chair: {ACTIONS;DENIES/REPORTS:21021675::"Denies"} Difficulty using hands for taps, buttons, cutlery, and/or writing: {ACTIONS;DENIES/REPORTS:21021675::"Denies"}  No Rheumatology ROS completed.   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) 05/17/2023   Dyspnea on exertion 05/17/2023   Prolonged Q-T interval on ECG 05/17/2023   Hypomagnesemia 05/17/2023   Leukocytosis 05/17/2023   Scrotal abscess 05/17/2023   History of ischemic right MCA stroke 05/17/2023   Hypertensive crisis 05/17/2023   Acute on chronic HFrEF (heart failure with reduced ejection fraction) (HCC) 05/17/2023   Non-ST elevation (NSTEMI) myocardial infarction (HCC) 05/17/2023   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) 06/03/2022   Acute gout due to renal impairment involving left elbow 06/03/2022   Acute gout due to renal impairment involving right  foot 05/07/2020   Hypertensive kidney disease with CKD stage IV (HCC) 01/23/2019   Noncompliance with treatment plan 10/28/2017   Encounter for monitoring statin therapy 10/28/2017   Chronic systolic heart failure (HCC) 10/28/2017   Acute renal failure superimposed on stage 5 chronic kidney disease, not on chronic dialysis (HCC) 05/04/2017   Uncontrolled stage 2 hypertension 05/04/2017   Prediabetes 05/03/2017   Elevated blood uric acid level 05/03/2017   Dyslipidemia, goal LDL below 100 05/03/2017   History of stroke 03/28/2017   CKD (chronic kidney disease) stage 4, GFR 15-29 ml/min (HCC) 03/28/2017   Gastroesophageal reflux disease 03/28/2017   Nonischemic cardiomyopathy (HCC) 03/28/2017   Class 3 severe obesity due to excess calories with serious comorbidity and body mass index (BMI) of 40.0 to 44.9 in adult (HCC) 03/28/2017   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 Graciela Husbands   History of stroke 2010   Right MCA distribution infarct, felt to be possibly embolic in the setting of cardiomyopathy - placed on Coumadin at that time   Hyperlipidemia    Nonischemic cardiomyopathy (HCC) 03/28/2017   Obesity  Office Visit Note  Patient: Zachary Lynch             Date of Birth: December 16, 1985           MRN: [id number]             PCP: Zachary Butter, NP Referring: Zachary Butter, NP Visit Date: 05/27/2023 Occupation: @GUAROCC @  Subjective:  No chief complaint on file.   History of Present Illness: Zachary Lynch is a 37 y.o. male ***   Labs reviewed 12/2022 Uric acid 8.8 eGFR 3 Lipase 343  Activities of Daily Living:  Patient reports morning stiffness for *** {minute/hour:19697}.   Patient {ACTIONS;DENIES/REPORTS:21021675::"Denies"} nocturnal pain.  Difficulty dressing/grooming: {ACTIONS;DENIES/REPORTS:21021675::"Denies"} Difficulty climbing stairs: {ACTIONS;DENIES/REPORTS:21021675::"Denies"} Difficulty getting out of chair: {ACTIONS;DENIES/REPORTS:21021675::"Denies"} Difficulty using hands for taps, buttons, cutlery, and/or writing: {ACTIONS;DENIES/REPORTS:21021675::"Denies"}  No Rheumatology ROS completed.   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) 05/17/2023   Dyspnea on exertion 05/17/2023   Prolonged Q-T interval on ECG 05/17/2023   Hypomagnesemia 05/17/2023   Leukocytosis 05/17/2023   Scrotal abscess 05/17/2023   History of ischemic right MCA stroke 05/17/2023   Hypertensive crisis 05/17/2023   Acute on chronic HFrEF (heart failure with reduced ejection fraction) (HCC) 05/17/2023   Non-ST elevation (NSTEMI) myocardial infarction (HCC) 05/17/2023   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) 06/03/2022   Acute gout due to renal impairment involving left elbow 06/03/2022   Acute gout due to renal impairment involving right  foot 05/07/2020   Hypertensive kidney disease with CKD stage IV (HCC) 01/23/2019   Noncompliance with treatment plan 10/28/2017   Encounter for monitoring statin therapy 10/28/2017   Chronic systolic heart failure (HCC) 10/28/2017   Acute renal failure superimposed on stage 5 chronic kidney disease, not on chronic dialysis (HCC) 05/04/2017   Uncontrolled stage 2 hypertension 05/04/2017   Prediabetes 05/03/2017   Elevated blood uric acid level 05/03/2017   Dyslipidemia, goal LDL below 100 05/03/2017   History of stroke 03/28/2017   CKD (chronic kidney disease) stage 4, GFR 15-29 ml/min (HCC) 03/28/2017   Gastroesophageal reflux disease 03/28/2017   Nonischemic cardiomyopathy (HCC) 03/28/2017   Class 3 severe obesity due to excess calories with serious comorbidity and body mass index (BMI) of 40.0 to 44.9 in adult (HCC) 03/28/2017   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 Graciela Husbands   History of stroke 2010   Right MCA distribution infarct, felt to be possibly embolic in the setting of cardiomyopathy - placed on Coumadin at that time   Hyperlipidemia    Nonischemic cardiomyopathy (HCC) 03/28/2017   Obesity  The catheter was flushed with appropriate volume heparin dwells. The catheter exit site was secured with a 0-Prolene retention suture. The venotomy incision was closed with Dermabond. Dressings were applied. The patient tolerated the procedure well without immediate post procedural complication. IMPRESSION: Successful placement of 23 cm tip to cuff tunneled hemodialysis catheter via the RIGHT internal jugular vein The tip of the catheter is positioned within the proximal RIGHT atrium. The catheter is ready for immediate use. Zachary Banning, MD [facility] Electronically Signed   By: Zachary Lynch M.D.   On: 05/18/2023 10:16   CARDIAC CATHETERIZATION  Addendum Date: 05/18/2023   Left and right heart catheterization 05/18/2023: Right dominant circulation Calcified and ectatic vessels with minimal irregularities No obstructive coronary artery disease RA: 5 mmHg RV: 39/5 mmHg PA: 29/19 mmHg, mPAP 23 mmHg PCW: 17 mmHg LV: 139/1 mmHg, LVEDP 29 mmHg CO: 9.4 L/min CI: 4.3 L/min/m2 Nonischemic cardiomyopathy Mild decompensation Borderline pulmonary hypertension (WHO Grp II) Zachary Negus, MD Pager: [phone] Office: [phone]   Result Date: 05/18/2023 Images from the original result were not included. Left and right heart catheterization 05/18/2023: Right dominant circulation Calcified and ectatic vessels with minimal irregularities No obstructive coronary artery disease RA: 5 mmHg RV: 39/5 mmHg PA: 29/19 mmHg, mPAP 23 mmHg PCW: 17 mmHg LV:  139/1 mmHg, LVEDP 29 mmHg CO: 9.4 L/min CI: 4.3 L/min/m2 Zachary Negus, MD Pager: [phone] Office: [phone]  DG Chest Port 1 View  Result Date: 05/18/2023 CLINICAL DATA:  Pulmonary edema. EXAM: PORTABLE CHEST 1 VIEW COMPARISON:  May 17, 2023. FINDINGS: Stable cardiomediastinal silhouette. Right internal jugular catheter is noted with tip in expected position of cavoatrial junction. Both lungs are clear. Subacute to old left clavicular fracture is noted. IMPRESSION: No active disease. Electronically Signed   By: Zachary Lynch M.D.   On: 05/18/2023 07:50   ECHOCARDIOGRAM COMPLETE  Result Date: 05/17/2023    ECHOCARDIOGRAM REPORT   Patient Name:   Zachary Lynch [facility] Date of Exam: 05/17/2023 Medical Rec #:  [id number]    Height:       71.0 in Accession #:    [id number]   Weight:       220.0 lb Date of Birth:  [birth year]     BSA:          2.196 m Patient Age:    37 years     BP:           122/104 mmHg Patient Gender: M            HR:           120 bpm. Exam Location:  Inpatient Procedure: 2D Echo, Cardiac Doppler, Color Doppler and Intracardiac            Opacification Agent Indications:    CHF  History:        Patient has prior history of Echocardiogram examinations, most                 recent 06/06/2018. Cardiomyopathy, Stroke; Risk Factors:Obesity,                 Dyslipidemia and Hypertension. CKD.  Sonographer:    Zachary Lynch Referring Phys: [id number] Zachary Lynch IMPRESSIONS  1. Left ventricular ejection fraction, by estimation, is 25%. The left ventricle has severely decreased function. The left ventricle demonstrates regional wall motion abnormalities (see scoring diagram/findings for description). The left ventricular internal cavity size was mildly dilated. There is moderate concentric

## 2023-05-30 ENCOUNTER — Ambulatory Visit (HOSPITAL_COMMUNITY)
Admit: 2023-05-30 | Discharge: 2023-05-30 | Disposition: A | Discharge status: Home / Self Care | Payer: Self-pay | Attending: Family Medicine | Admitting: Family Medicine

## 2023-05-30 ENCOUNTER — Other Ambulatory Visit (HOSPITAL_COMMUNITY): Payer: Self-pay

## 2023-05-30 ENCOUNTER — Encounter (HOSPITAL_COMMUNITY): Payer: Self-pay

## 2023-05-30 ENCOUNTER — Ambulatory Visit (INDEPENDENT_AMBULATORY_CARE_PROVIDER_SITE_OTHER): Payer: Self-pay | Admitting: Medical-Surgical

## 2023-05-30 VITALS — BP 130/98 | HR 81 | Wt 230.0 lb

## 2023-05-30 DIAGNOSIS — Z992 Dependence on renal dialysis: Secondary | ICD-10-CM | POA: Insufficient documentation

## 2023-05-30 DIAGNOSIS — Z79899 Other long term (current) drug therapy: Secondary | ICD-10-CM | POA: Insufficient documentation

## 2023-05-30 DIAGNOSIS — I10 Essential (primary) hypertension: Secondary | ICD-10-CM

## 2023-05-30 DIAGNOSIS — E669 Obesity, unspecified: Secondary | ICD-10-CM

## 2023-05-30 DIAGNOSIS — Z139 Encounter for screening, unspecified: Secondary | ICD-10-CM

## 2023-05-30 DIAGNOSIS — I428 Other cardiomyopathies: Secondary | ICD-10-CM | POA: Insufficient documentation

## 2023-05-30 DIAGNOSIS — Z8673 Personal history of transient ischemic attack (TIA), and cerebral infarction without residual deficits: Secondary | ICD-10-CM

## 2023-05-30 DIAGNOSIS — I132 Hypertensive heart and chronic kidney disease with heart failure and with stage 5 chronic kidney disease, or end stage renal disease: Secondary | ICD-10-CM | POA: Insufficient documentation

## 2023-05-30 DIAGNOSIS — Z91199 Patient's noncompliance with other medical treatment and regimen due to unspecified reason: Secondary | ICD-10-CM

## 2023-05-30 DIAGNOSIS — E785 Hyperlipidemia, unspecified: Secondary | ICD-10-CM

## 2023-05-30 DIAGNOSIS — Z6832 Body mass index (BMI) 32.0-32.9, adult: Secondary | ICD-10-CM | POA: Insufficient documentation

## 2023-05-30 DIAGNOSIS — N186 End stage renal disease: Secondary | ICD-10-CM

## 2023-05-30 DIAGNOSIS — I5022 Chronic systolic (congestive) heart failure: Secondary | ICD-10-CM

## 2023-05-30 MED ORDER — ISOSORBIDE MONONITRATE ER 30 MG PO TB24
30.0000 mg | ORAL_TABLET | Freq: Every day | ORAL | 2 refills | Status: DC
  Filled 2023-05-30: qty 30, 30d supply, fill #0
  Filled 2023-07-07 – 2023-10-24 (×3): qty 30, 30d supply, fill #1
  Filled 2023-12-21: qty 30, 30d supply, fill #2

## 2023-05-30 MED ORDER — ATORVASTATIN CALCIUM 20 MG PO TABS
20.0000 mg | ORAL_TABLET | Freq: Every day | ORAL | 2 refills | Status: DC
  Filled 2023-05-30: qty 30, 30d supply, fill #0
  Filled 2023-07-07 – 2023-10-24 (×3): qty 30, 30d supply, fill #1
  Filled 2023-12-21: qty 30, 30d supply, fill #2

## 2023-05-30 MED ORDER — ASPIRIN 81 MG PO TBEC
81.0000 mg | DELAYED_RELEASE_TABLET | Freq: Every day | ORAL | 2 refills | Status: DC
  Filled 2023-05-30: qty 30, 30d supply, fill #0
  Filled 2023-07-07 – 2023-10-24 (×3): qty 30, 30d supply, fill #1
  Filled 2023-12-21: qty 30, 30d supply, fill #2

## 2023-05-30 MED ORDER — CARVEDILOL 12.5 MG PO TABS
12.5000 mg | ORAL_TABLET | Freq: Two times a day (BID) | ORAL | 2 refills | Status: DC
  Filled 2023-05-30: qty 60, 30d supply, fill #0

## 2023-05-30 MED ORDER — HYDRALAZINE HCL 25 MG PO TABS
25.0000 mg | ORAL_TABLET | Freq: Three times a day (TID) | ORAL | 2 refills | Status: DC
  Filled 2023-05-30: qty 90, 30d supply, fill #0
  Filled 2023-07-07 – 2023-10-24 (×3): qty 90, 30d supply, fill #1
  Filled 2023-12-21: qty 90, 30d supply, fill #2

## 2023-05-30 NOTE — Patient Instructions (Addendum)
No Labs done today.   EKG done today.   STOP taking Bidil  START Hydralazine 25mg  (1 tablet) by mouth 3 times daily.  START Imdur 30mg  (1 tablet) by mouth daily.  No other medication changes were made. Please continue all current medications as prescribed.  Your physician recommends that you schedule a follow-up appointment in: 6 weeks with our NP/PA Clinic here in our office and in 3 months with Dr. Elwyn Lade with an echo prior to your appointment.  Your physician has requested that you have an echocardiogram. Echocardiography is a painless test that uses sound waves to create images of your heart. It provides your doctor with information about the size and shape of your heart and how well your heart's chambers and valves are working. This procedure takes approximately one hour. There are no restrictions for this procedure. Please do NOT wear cologne, perfume, aftershave, or lotions (deodorant is allowed). Please arrive 15 minutes prior to your appointment time.  If you have any questions or concerns before your next appointment please send [place] a message through [place] or call our office at [phone].    TO LEAVE A MESSAGE FOR THE NURSE SELECT OPTION 2, PLEASE LEAVE A MESSAGE INCLUDING: YOUR NAME DATE OF BIRTH CALL BACK NUMBER REASON FOR CALL**this is important as we prioritize the call backs  YOU WILL RECEIVE A CALL BACK THE SAME DAY AS LONG AS YOU CALL BEFORE 4:00 PM   Do the following things EVERYDAY: Weigh yourself in the morning before breakfast. Write it down and keep it in a log. Take your medicines as prescribed Eat low salt foods--Limit salt (sodium) to 2000 mg per day.  Stay as active as you can everyday Limit all fluids for the day to less than 2 liters   At the [facility], you and your health needs are our priority. As part of our continuing mission to provide you with exceptional heart care, we have created designated Provider Care Teams. These  Care Teams include your primary Cardiologist (physician) and Advanced Practice Providers (APPs- Physician Assistants and Nurse Practitioners) who all work together to provide you with the care you need, when you need it.   You may see any of the following providers on your designated Care Team at your next follow up: Dr Arvilla Meres Dr Marca Ancona Dr. Marcos Eke, NP Robbie Lis, [place] [facility] [place], [place] Brynda Peon, NP Karle Plumber, PharmD   Please be sure to bring in all your medications bottles to every appointment.    Thank you for choosing [facility]

## 2023-05-30 NOTE — Progress Notes (Unsigned)
   Complete physical exam  Patient: Zachary Lynch   DOB: 07/10/2001   37 y.o. Male  MRN: [id number]  Subjective:    No chief complaint on file.   Zachary Lynch is a 37 y.o. male who presents today for a complete physical exam. She reports consuming a {diet types:17450} diet. {types:19826} She generally feels {DESC; WELL/FAIRLY WELL/POORLY:18703}. She reports sleeping {DESC; WELL/FAIRLY WELL/POORLY:18703}. She {does/does not:200015} have additional problems to discuss today.    Most recent fall risk assessment:    03/17/2022   10:42 AM  Fall Risk   Falls in the past year? 0  Number falls in past yr: 0  Injury with Fall? 0  Risk for fall due to : No Fall Risks  Follow up Falls evaluation completed     Most recent depression screenings:    03/17/2022   10:42 AM 02/05/2021   10:46 AM  PHQ 2/9 Scores  PHQ - 2 Score 0 0  PHQ- 9 Score 5     {VISON DENTAL STD PSA (Optional):27386}  {History (Optional):23778}  Patient Care Team: Jessup, Joy, NP as PCP - General (Nurse Practitioner)   Outpatient Medications Prior to Visit  Medication Sig   fluticasone (FLONASE) 50 MCG/ACT nasal spray Place 2 sprays into both nostrils in the morning and at bedtime. After 7 days, reduce to once daily.   norgestimate-ethinyl estradiol (SPRINTEC 28) 0.25-35 MG-MCG tablet Take 1 tablet by mouth daily.   Nystatin POWD Apply liberally to affected area 2 times per day   spironolactone (ALDACTONE) 100 MG tablet Take 1 tablet (100 mg total) by mouth daily.   No facility-administered medications prior to visit.    ROS        Objective:     There were no vitals taken for this visit. {Vitals History (Optional):23777}  Physical Exam   No results found for any visits on 04/22/22. {Show previous labs (optional):23779}    Assessment & Plan:    Routine Health Maintenance and Physical Exam  Immunization History  Administered Date(s) Administered   DTaP 09/23/1999, 11/19/1999,  01/28/2000, 10/13/2000, 04/28/2004   Hepatitis A 02/23/2008, 02/28/2009   Hepatitis B 07/11/1999, 08/18/1999, 01/28/2000   HiB (PRP-OMP) 09/23/1999, 11/19/1999, 01/28/2000, 10/13/2000   IPV 09/23/1999, 11/19/1999, 07/18/2000, 04/28/2004   Influenza,inj,Quad PF,6+ Mos 05/31/2014   Influenza-Unspecified 08/30/2012   MMR 07/18/2001, 04/28/2004   Meningococcal Polysaccharide 02/28/2012   Pneumococcal Conjugate-13 10/13/2000   Pneumococcal-Unspecified 01/28/2000, 04/12/2000   Tdap 02/28/2012   Varicella 07/18/2000, 02/23/2008    Health Maintenance  Topic Date Due   HIV Screening  Never done   Hepatitis C Screening  Never done   INFLUENZA VACCINE  04/20/2022   PAP-Cervical Cytology Screening  04/22/2022 (Originally 07/09/2020)   PAP SMEAR-Modifier  04/22/2022 (Originally 07/09/2020)   TETANUS/TDAP  04/22/2022 (Originally 02/27/2022)   HPV VACCINES  Discontinued   COVID-19 Vaccine  Discontinued    Discussed health benefits of physical activity, and encouraged her to engage in regular exercise appropriate for her age and condition.  Problem List Items Addressed This Visit   None Visit Diagnoses     Annual physical exam    -  Primary   Cervical cancer screening       Need for Tdap vaccination          No follow-ups on file.     Joy Jessup, NP   

## 2023-05-31 ENCOUNTER — Other Ambulatory Visit: Payer: Self-pay | Admitting: Radiology

## 2023-05-31 ENCOUNTER — Other Ambulatory Visit (HOSPITAL_COMMUNITY): Payer: Self-pay | Admitting: Nurse Practitioner

## 2023-05-31 ENCOUNTER — Telehealth (HOSPITAL_COMMUNITY): Payer: Self-pay | Admitting: Pharmacy Technician

## 2023-05-31 DIAGNOSIS — N186 End stage renal disease: Secondary | ICD-10-CM

## 2023-05-31 NOTE — Telephone Encounter (Addendum)
Advanced Heart Failure Patient Advocate Encounter  Patient was seen in clinic yesterday and needs help with [facility] co-pay. Sent in [employer] assistance application via fax. Document scanned to chart.   Will follow up.

## 2023-06-01 ENCOUNTER — Ambulatory Visit (HOSPITAL_COMMUNITY)
Admission: RE | Admit: 2023-06-01 | Discharge: 2023-06-01 | Disposition: A | Discharge status: Home / Self Care | Payer: Medicaid Other | Source: Ambulatory Visit | Attending: Nurse Practitioner | Admitting: Nurse Practitioner

## 2023-06-01 DIAGNOSIS — N186 End stage renal disease: Secondary | ICD-10-CM

## 2023-06-01 DIAGNOSIS — T85868A Thrombosis due to other internal prosthetic devices, implants and grafts, initial encounter: Secondary | ICD-10-CM | POA: Insufficient documentation

## 2023-06-01 HISTORY — PX: IR FLUORO GUIDE CV LINE RIGHT: IMG2283

## 2023-06-01 MED ORDER — CEFAZOLIN SODIUM-DEXTROSE 2-4 GM/100ML-% IV SOLN
INTRAVENOUS | Status: AC
  Filled 2023-06-01: qty 100

## 2023-06-01 MED ORDER — CHLORHEXIDINE GLUCONATE 4 % EX SOLN
CUTANEOUS | Status: AC
  Filled 2023-06-01: qty 15

## 2023-06-01 MED ORDER — CEFAZOLIN SODIUM-DEXTROSE 2-4 GM/100ML-% IV SOLN
2.0000 g | Freq: Once | INTRAVENOUS | Status: AC
  Administered 2023-06-01: 2 g via INTRAVENOUS

## 2023-06-01 MED ORDER — HEPARIN SODIUM (PORCINE) 1000 UNIT/ML IJ SOLN
INTRAMUSCULAR | Status: AC
  Filled 2023-06-01: qty 10

## 2023-06-01 MED ORDER — LIDOCAINE-EPINEPHRINE 1 %-1:100000 IJ SOLN: 10.0000 mL | Freq: Once | INTRAMUSCULAR | Status: DC

## 2023-06-01 MED ORDER — LIDOCAINE-EPINEPHRINE 1 %-1:100000 IJ SOLN
INTRAMUSCULAR | Status: AC
  Filled 2023-06-01: qty 1

## 2023-06-01 NOTE — Procedures (Signed)
Pre Procedural Dx: ESRD, poorly functioning HD catheter. ?Post Procedural Dx: Same ? ?Successful fluoroscopic guided exchange of existing right jugular approach dialysis catheter.   ?Catheter is ready for immediate use.   ? ?EBL: None ?No immediate post procedural complications.  ? ?Jay Watts, MD ?Pager #: [phone] ?  ?

## 2023-06-05 ENCOUNTER — Other Ambulatory Visit (HOSPITAL_COMMUNITY): Payer: Self-pay

## 2023-06-05 MED ORDER — CARVEDILOL 6.25 MG PO TABS
6.2500 mg | ORAL_TABLET | Freq: Two times a day (BID) | ORAL | 11 refills | Status: DC
  Filled 2023-06-05 – 2023-11-23 (×4): qty 60, 30d supply, fill #0
  Filled 2023-12-21: qty 60, 30d supply, fill #1
  Filled 2024-03-20 – 2024-03-21 (×2): qty 60, 30d supply, fill #2
  Filled 2024-04-15: qty 60, 30d supply, fill #3

## 2023-06-06 ENCOUNTER — Other Ambulatory Visit (HOSPITAL_COMMUNITY): Payer: Self-pay

## 2023-06-16 ENCOUNTER — Other Ambulatory Visit (HOSPITAL_COMMUNITY): Payer: Self-pay

## 2023-06-16 NOTE — Telephone Encounter (Signed)
Advanced Heart Failure Patient Advocate Encounter  Called and left the patient a message regarding POI requirement for [employer] assistance.

## 2023-06-17 ENCOUNTER — Ambulatory Visit: Payer: 59 | Admitting: Cardiology

## 2023-06-20 ENCOUNTER — Encounter (HOSPITAL_COMMUNITY): Payer: 59 | Admitting: Internal Medicine

## 2023-06-20 NOTE — Telephone Encounter (Signed)
Advanced Heart Failure Patient Advocate Encounter  Spoke with patient. He will email a copy of POI to send in.

## 2023-06-20 NOTE — Telephone Encounter (Signed)
Advanced Heart Failure Patient Advocate Encounter  Sent POI to [employer] via efax.   Will follow up.

## 2023-06-21 NOTE — Telephone Encounter (Signed)
Advanced Heart Failure Patient Advocate Encounter   Patient was approved to receive Entresto from [employer]  Effective dates: 06/20/23 through 06/19/24  Document scanned to chart. Copy of approval provided to patient.  Archer Asa, CPhT

## 2023-06-22 ENCOUNTER — Other Ambulatory Visit (HOSPITAL_COMMUNITY): Payer: Self-pay

## 2023-06-22 MED ORDER — PREDNISONE 20 MG PO TABS
40.0000 mg | ORAL_TABLET | Freq: Every day | ORAL | 0 refills | Status: DC
  Filled 2023-06-22 – 2023-10-24 (×3): qty 30, 15d supply, fill #0

## 2023-07-05 ENCOUNTER — Ambulatory Visit: Payer: Medicaid Other | Attending: Cardiology | Admitting: Cardiology

## 2023-07-05 ENCOUNTER — Other Ambulatory Visit (HOSPITAL_COMMUNITY): Payer: Self-pay

## 2023-07-07 ENCOUNTER — Other Ambulatory Visit (HOSPITAL_COMMUNITY): Payer: Self-pay

## 2023-07-08 ENCOUNTER — Other Ambulatory Visit: Payer: Self-pay

## 2023-07-08 ENCOUNTER — Other Ambulatory Visit (HOSPITAL_COMMUNITY): Payer: Self-pay

## 2023-07-12 ENCOUNTER — Encounter (HOSPITAL_COMMUNITY): Payer: Medicaid Other

## 2023-07-13 ENCOUNTER — Other Ambulatory Visit: Payer: Self-pay

## 2023-07-19 ENCOUNTER — Telehealth (HOSPITAL_COMMUNITY): Payer: Self-pay

## 2023-07-19 ENCOUNTER — Other Ambulatory Visit (HOSPITAL_COMMUNITY): Payer: Self-pay

## 2023-07-19 NOTE — Telephone Encounter (Signed)
Front office called patient at [phone] about appointment on 07/29/2023 with the APP clinic being moved to a different date.   Front office left a voicemail on the telephone for patient to call back to reschedule appointment.

## 2023-07-29 ENCOUNTER — Encounter (HOSPITAL_COMMUNITY): Payer: Medicaid Other

## 2023-08-11 ENCOUNTER — Telehealth (HOSPITAL_COMMUNITY): Payer: Self-pay

## 2023-08-11 NOTE — Telephone Encounter (Signed)
Called to confirm/remind patient of their appointment at the [facility] on 08/12/23.However, patient needed to reschedule and will keep his upcoming appointment.

## 2023-08-12 ENCOUNTER — Encounter (HOSPITAL_COMMUNITY): Payer: Medicaid Other

## 2023-08-16 ENCOUNTER — Encounter (HOSPITAL_COMMUNITY): Payer: Self-pay

## 2023-08-25 ENCOUNTER — Ambulatory Visit (HOSPITAL_COMMUNITY): Admission: RE | Admit: 2023-08-25 | Payer: Commercial Managed Care - PPO | Source: Ambulatory Visit

## 2023-08-25 ENCOUNTER — Encounter (HOSPITAL_COMMUNITY): Payer: Medicaid Other | Admitting: Cardiology

## 2023-08-25 NOTE — Progress Notes (Incomplete)
ADVANCED HEART FAILURE FOLLOW UP CLINIC NOTE  Referring Physician: [name], NP  Primary Care: Pcp, No Primary Cardiologist:  HPI: Zachary Lynch is a 37 y.o. male who presents for follow up of ***.      Echo (2010): EF 15%. Echo (2016): EF 25%.    Initial visit Dr. Gala Lynch 05/2018. GDMT titrated. Echo (9/19): EF 30-35%. Lost to follow up due to financial concerns.   Admitted 8/24 with a/c HF, off all meds x 3 months due to finances. HsTroponins > 5k (no CP, in setting of CKD IV) and creatinine 21.8. Had not seen Nephrology since 2022. ECG showed ST with subtle TWI. Echo showed EF 25%, moderate eccentric LVH, RV hyperdynamic. Underwent TDC placement, received dialysis on 05/17/23, and then underwent R/LHC showing mildly elevated filling pressures, borderline pulmonary hypertension, and no obstructive CAD. He continued to make some urine and was continued on Entresto. Hospitalization c/b scrotal abscess, general surgery consulted and recommended supportive care. Outpatient iHD arranged and he was discharged home, weight 220 lbs.     SUBJECTIVE:  PMH, current medications, allergies, social history, and family history reviewed in epic.  PHYSICAL EXAM: There were no vitals filed for this visit. GENERAL: Well nourished and in no apparent distress at rest.  HEENT: The mucous membranes are pink and moist.   PULM:  Normal work of breathing, clear to auscultation bilaterally. Respirations are unlabored.  CARDIAC:  JVP: ***         Normal rate with regular rhythm. No murmurs, rubs or gallops.  *** edema.  ABDOMEN: Soft, non-tender, non-distended. NEUROLOGIC: Patient is oriented x3 with no focal or lateralizing neurologic deficits.  PSYCH: Patients affect is appropriate, there is no evidence of anxiety or depression.  SKIN: Warm and dry; no lesions or wounds. Warm and well perfused extremities.  DATA REVIEW  ECG: ***  As per my personal interpretation  ECHO: *** As per my personal  interpretation  CATH: R/LHC (8/24): Right dominant circulation, calcified and ectatic vessels with minimal irregularities, no obstructive CAD RA 5, PA 29/19 (23), PCWP 17, CO/CI (Fick) 9.4/4.3   Heart failure review: - Classification: {HFCLASS:30917} - Etiology: {Cardiomyopathy:30918} - NYHA Class:  - Volume status: {volumechf:30919} - ACEi/ARB/ARNI: {HF:30752} - Aldosterone antagonist: {HF:30752} - Beta-blocker: {HF:30752} - Digoxin: {HF:30752} - Hydralazine/Nitrates: {HF:30752} - SGLT2i: {HF:30752} - GLP-1: {GLP:30906} - Advanced therapies: {Advancedtherapies:30916} - ICD: {ICD:30901}  ASSESSMENT & PLAN:  Chronic Systolic Heart Failure - NICM, HTN-CM - Echo (2010): EF 10%, post CVA - Echo (2016): EF 25% - Echo (8/24): EF 25%, moderate eccentric LVH, RV hyperdynamic.  - R/LHC (8/24): no obstructive CAD; RA 5, PA 29/19 (23), PCWP 17, CO/CI (Fick) 9.4/4.3 - NYHA II, volume management per iHD. - Continue Entresto 24/26 mg bid. He continues to make urine, OK to continue as long as no [employer] Patient assistance grant started. - Continue Coreg 12.5 mg bid. - Stop BiDil. Start hydralazine 25 mg tid + Imdur 30 mg daily (HF fund preference) - Repeat echo in 3 months. EF has been down x years. If EF remains < 35%, consider referral to EP for ICD, narrow QRS. - May be a candidate for heart + kidney down. Will need CPX at some point.    ESRD - Dialyzing TDC, 1st HD session 05/17/23. - TTS schedule ([facility] ). - VVS to evaluate for AVF/graft down the road.   HTN - BP controlled - GDMT as above - If BP drops around dialysis, would pull back on beta blocker  first.   Obesity - Body mass index is 32.08 kg/m. - Continue weight loss efforts   5. SDOH - He lives with his aunt - Insurance thru work starts 10/24 - Cardiac meds sent to [facility] pharm for HF fund - [name] started for Zachary Bellow, MD Advanced Heart Failure Mechanical Circulatory  Support 08/25/23

## 2023-10-10 ENCOUNTER — Other Ambulatory Visit: Payer: Self-pay

## 2023-10-10 ENCOUNTER — Other Ambulatory Visit (HOSPITAL_COMMUNITY): Payer: Self-pay

## 2023-10-20 ENCOUNTER — Other Ambulatory Visit (HOSPITAL_COMMUNITY): Payer: Self-pay

## 2023-10-24 ENCOUNTER — Other Ambulatory Visit (HOSPITAL_COMMUNITY): Payer: Self-pay

## 2023-11-01 ENCOUNTER — Telehealth (HOSPITAL_COMMUNITY): Payer: Self-pay | Admitting: Cardiology

## 2023-11-01 NOTE — Telephone Encounter (Signed)
Called patient at [phone] to remind patient of his appointments tomorrow Wednesday, November 02, 2023.  Patient was given a reminder that he has an echocardiogram at 8:00 AM and then following the echocardiogram he will see Dr. Elwyn Lade at 9:00 AM.  Yehuda Mao office left this patient a message on his telephone reminding him of both appointments on tomorrow 11/02/23.

## 2023-11-02 ENCOUNTER — Encounter (HOSPITAL_COMMUNITY): Payer: Self-pay | Admitting: Cardiology

## 2023-11-02 ENCOUNTER — Ambulatory Visit (HOSPITAL_COMMUNITY)
Admission: RE | Admit: 2023-11-02 | Discharge: 2023-11-02 | Disposition: A | Discharge status: Home / Self Care | Payer: Medicaid Other | Source: Ambulatory Visit | Attending: Cardiology | Admitting: Cardiology

## 2023-11-02 VITALS — BP 130/82 | HR 83

## 2023-11-02 DIAGNOSIS — N186 End stage renal disease: Secondary | ICD-10-CM

## 2023-11-02 DIAGNOSIS — I5022 Chronic systolic (congestive) heart failure: Secondary | ICD-10-CM

## 2023-11-02 DIAGNOSIS — Z6841 Body Mass Index (BMI) 40.0 and over, adult: Secondary | ICD-10-CM

## 2023-11-02 DIAGNOSIS — Z79899 Other long term (current) drug therapy: Secondary | ICD-10-CM | POA: Insufficient documentation

## 2023-11-02 DIAGNOSIS — E669 Obesity, unspecified: Secondary | ICD-10-CM | POA: Diagnosis not present

## 2023-11-02 DIAGNOSIS — E66813 Obesity, class 3: Secondary | ICD-10-CM

## 2023-11-02 DIAGNOSIS — I428 Other cardiomyopathies: Secondary | ICD-10-CM | POA: Insufficient documentation

## 2023-11-02 DIAGNOSIS — Z006 Encounter for examination for normal comparison and control in clinical research program: Secondary | ICD-10-CM

## 2023-11-02 DIAGNOSIS — Z992 Dependence on renal dialysis: Secondary | ICD-10-CM | POA: Diagnosis not present

## 2023-11-02 DIAGNOSIS — E785 Hyperlipidemia, unspecified: Secondary | ICD-10-CM | POA: Insufficient documentation

## 2023-11-02 DIAGNOSIS — M109 Gout, unspecified: Secondary | ICD-10-CM | POA: Insufficient documentation

## 2023-11-02 DIAGNOSIS — I5023 Acute on chronic systolic (congestive) heart failure: Secondary | ICD-10-CM

## 2023-11-02 DIAGNOSIS — Z8673 Personal history of transient ischemic attack (TIA), and cerebral infarction without residual deficits: Secondary | ICD-10-CM | POA: Diagnosis not present

## 2023-11-02 DIAGNOSIS — I132 Hypertensive heart and chronic kidney disease with heart failure and with stage 5 chronic kidney disease, or end stage renal disease: Secondary | ICD-10-CM | POA: Insufficient documentation

## 2023-11-02 LAB — ECHOCARDIOGRAM COMPLETE
AR max vel: 2.03 cm2
AV Area VTI: 2.53 cm2
AV Area mean vel: 2 cm2
AV Mean grad: 4 mm[Hg]
AV Peak grad: 7.3 mm[Hg]
Ao pk vel: 1.35 m/s
Area-P 1/2: 6.37 cm2
Calc EF: 46.7 %
MV VTI: 2.79 cm2
S' Lateral: 4.1 cm
Single Plane A2C EF: 44.5 %
Single Plane A4C EF: 47 %

## 2023-11-02 NOTE — Progress Notes (Signed)
  Echocardiogram 2D Echocardiogram has been performed.  Ocie Doyne RDCS 11/02/2023, 8:45 AM

## 2023-11-02 NOTE — Progress Notes (Signed)
ADVANCED HEART FAILURE NEW PATIENT CLINIC NOTE  Referring Physician: No ref. provider found  Primary Care: Pcp, No Primary Cardiologist:  HPI: Zachary Lynch is a 38 y.o. 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      NICM that was diagnosed when he had a stroke in 2010. Initially followed Dr. Lynnea Ferrier at [facility], transiently wore a LifeVest and then saw Dr. Diona Browner in 2016. Has not seen a cardiologist since. Referred back to AHF Clinic by Gena Fray PA-C for follow-up of his HF in 2019.   Echo (2010): EF 15%.    Echo (2016): EF 25%.    Initial visit Dr. Gala Romney 05/2018. GDMT titrated. Echo (9/19): EF 30-35%. Lost to follow up due to financial concerns.   Admitted 8/24 with a/c HF, off all meds x 3 months due to finances. HsTroponins > 5k (no CP, in setting of CKD IV) and creatinine 21.8. Had not seen Nephrology since 2022. ECG showed ST with subtle TWI. Echo showed EF 25%, moderate eccentric LVH, RV hyperdynamic. Underwent TDC placement, received dialysis on 05/17/23, and then underwent R/LHC showing mildly elevated filling pressures, borderline pulmonary hypertension, and no obstructive CAD. He continued to make some urine and was continued on Entresto. Hospitalization c/b scrotal abscess, general surgery consulted and recommended supportive care. Outpatient iHD arranged and he was discharged home, weight 220 lbs.  Family Hx: Mother with MI (died at 34), HTN, father with HTN     SUBJECTIVE:  Patient overall states that he is doing fairly well.  He has been able to tolerate dialysis sessions with the exception of his most recent 1 on Tuesday.  He had missed a session on Saturday due to having to come in to cover at work and they had more fluid to take off than normal.  Otherwise he does fairly well.  He takes his blood pressure  medications after dialysis, taking his hydralazine only 2 times a day on those days.  He has only been taking his Entresto in the morning however.  Denies any heart failure symptoms including shortness of breath, orthopnea, PND.  He still makes a significant amount of urine.  PMH, current medications, allergies, social history, and family history reviewed in epic.  PHYSICAL EXAM: Vitals:   11/02/23 0846  BP: 130/82  Pulse: 83  SpO2: 99%   GENERAL: Well nourished and in no apparent distress at rest.  PULM:  Normal work of breathing, clear to auscultation bilaterally. Respirations are unlabored.  CARDIAC:  JVP: Not elevated         Normal rate and rhythm, no murmurs, no lower extremity edema ABDOMEN: Soft, non-tender, non-distended. NEUROLOGIC: Patient is oriented x3 with no focal or lateralizing neurologic deficits.    DATA REVIEW  ECG: 10/2023: NSR, normal QRS    ECHO: 2010: EF 15% Echo (8/24): EF 25%, moderate eccentric LVH, RV hyperdynamic.   CATH: - R/LHC (8/24): Right dominant circulation, calcified and ectatic vessels with minimal irregularities, no obstructive CAD RA 5, PA 29/19 (23), PCWP 17, CO/CI (Fick) 9.4/4.3    Heart failure review: - Classification: Heart failure with reduced EF - Etiology: HTN related - NYHA Class: I - Volume status: Euvolemic - ACEi/ARB/ARNI: Currently up-titrating - Aldosterone antagonist: Intolerant - Beta-blocker: Currently up-titrating - Digoxin: Intolerant - Hydralazine/Nitrates: Currently up-titrating - SGLT2i: Intolerant - GLP-1: Not a candidate -  Advanced therapies: Not needed at this time - ICD: Currently uptitrating GDMT   ASSESSMENT & PLAN:  Chronic systolic heart failure: Noted since 2010, EF had remained low for some time.  Left heart catheterization in 2024 with normal coronaries, suspect hypertension related.  EF now improved to 40 to 45% on echocardiogram today, suspect the ability to start ARNI with end-stage renal  disease contributing. -Increase Entresto to twice daily dosing, 24/26 mg -Continue hydralazine 25 mg 3 times daily, Imdur 30 mg daily -Continue carvedilol 6.25 mg twice daily, likely increase at next visit -Ejection fraction improving with NYHA class I symptoms, should not be a barrier to renal transplant consideration -Does not qualify or need advanced therapies at this time.  ESRD: Suspect HTN related, tolearting iHD well. - Continue renal follow up - Fistula/graft placement in the future  CVA: Suspect due to LV thrombus from description.   HTN: Controlled today, will continue to titrate medical therapy as above    Follow up in 3 months  Clearnce Hasten, MD Advanced Heart Failure Mechanical Circulatory Support 11/02/23

## 2023-11-02 NOTE — Patient Instructions (Signed)
Good to see you today!   Start taking Entresto  2 x a day  Your physician recommends that you schedule a follow-up appointment in: 3 months(May) Call office in March to schedule an appointment   If you have any questions or concerns before your next appointment please send [place] a message through [facility] or call our office at [phone].    TO LEAVE A MESSAGE FOR THE NURSE SELECT OPTION 2, PLEASE LEAVE A MESSAGE INCLUDING: YOUR NAME DATE OF BIRTH CALL BACK NUMBER REASON FOR CALL**this is important as we prioritize the call backs  YOU WILL RECEIVE A CALL BACK THE SAME DAY AS LONG AS YOU CALL BEFORE 4:00 PM  At the [facility], you and your health needs are our priority. As part of our continuing mission to provide you with exceptional heart care, we have created designated Provider Care Teams. These Care Teams include your primary Cardiologist (physician) and Advanced Practice Providers (APPs- Physician Assistants and Nurse Practitioners) who all work together to provide you with the care you need, when you need it.   You may see any of the following providers on your designated Care Team at your next follow up: Dr Arvilla Meres Dr Marca Ancona Dr. Dorthula Nettles Dr. Clearnce Hasten Amy Filbert Schilder, NP Robbie Lis, [place] [facility] [place], [place] Brynda Peon, NP [place] Lee, NP Karle Plumber, PharmD   Please be sure to bring in all your medications bottles to every appointment.    Thank you for choosing [facility]

## 2023-11-02 NOTE — Research (Signed)
SITE: 050     Subject # [id number]   Subprotocol: A  Inclusion Criteria  Patients who meet all of the following criteria are eligible for enrollment as study participants:  Yes No  Age > 40 years old X   Eligible to wear Holter Study X    Exclusion Criteria  Patients who meet any of these criteria are not eligible for enrollment as study participants: Yes No  1. Receiving any mechanical (respiratory or circulatory) or renal support therapy at Screening or during Visit #1.  X  2.  Any other conditions that in the opinion of the investigators are likely to prevent compliance with the study protocol or pose a safety concern if the subject participates in the study.  X  3. Poor tolerance, namely susceptible to severe skin allergies from ECG adhesive patch application.  X   Protocol: REV H                                     Residential Zip code [address] (First 3 digits ONLY)                                             [facility] Informed Consent   Subject Name: Zachary Lynch  Subject met inclusion and exclusion criteria.  The informed consent form, study requirements and expectations were reviewed with the subject. Subject had opportunity to read consent and questions and concerns were addressed prior to the signing of the consent form.  The subject verbalized understanding of the trial requirements.  The subject agreed to participate in the [facility] EF ACT trial and signed the informed consent at 08:41 on 01-Nov-2022.  The informed consent was obtained prior to performance of any protocol-specific procedures for the subject.  A copy of the signed informed consent was given to the subject and a copy was placed in the subject's medical record.   Dyanne Iha          Current Outpatient Medications:    acetaminophen (TYLENOL) 325 MG tablet, Take 2 tablets (650 mg total) by mouth every 4 (four) hours as needed for headache or mild pain., Disp: , Rfl:    aspirin EC (ASPIRIN LOW DOSE) 81 MG tablet,  Take 1 tablet (81 mg total) by mouth daily., Disp: 30 tablet, Rfl: 2   atorvastatin (LIPITOR) 20 MG tablet, Take 1 tablet (20 mg total) by mouth daily., Disp: 30 tablet, Rfl: 2   carvedilol (COREG) 6.25 MG tablet, Take 1 tablet (6.25 mg total) by mouth 2 (two) times daily., Disp: 60 tablet, Rfl: 11   Darbepoetin Alfa (ARANESP) 100 MCG/0.5ML SOSY injection, Inject 0.5 mLs (100 mcg total) into the skin every Friday at 6 PM., Disp: , Rfl:    hydrALAZINE (APRESOLINE) 25 MG tablet, Take 1 tablet (25 mg total) by mouth 3 (three) times daily., Disp: 90 tablet, Rfl: 2   isosorbide mononitrate (IMDUR) 30 MG 24 hr tablet, Take 1 tablet (30 mg total) by mouth daily., Disp: 30 tablet, Rfl: 2   predniSONE (DELTASONE) 20 MG tablet, Take 2 tablets (40 mg total) by mouth daily for up to 5 days during gout flares, Disp: 30 tablet, Rfl: 0   sacubitril-valsartan (ENTRESTO) 24-26 MG, Take 1 tablet by mouth 2 (two) times daily., Disp: 60 tablet, Rfl:  0                                                                                                                     

## 2023-11-23 ENCOUNTER — Other Ambulatory Visit (HOSPITAL_COMMUNITY): Payer: Self-pay

## 2023-12-05 ENCOUNTER — Other Ambulatory Visit: Payer: Self-pay

## 2023-12-05 ENCOUNTER — Inpatient Hospital Stay (HOSPITAL_COMMUNITY)
Admission: EM | Admit: 2023-12-05 | Discharge: 2023-12-07 | DRG: 717 | Disposition: A | Discharge status: Home / Self Care | Attending: Family Medicine | Admitting: Family Medicine

## 2023-12-05 DIAGNOSIS — Z888 Allergy status to other drugs, medicaments and biological substances status: Secondary | ICD-10-CM

## 2023-12-05 DIAGNOSIS — N492 Inflammatory disorders of scrotum: Principal | ICD-10-CM | POA: Diagnosis present

## 2023-12-05 DIAGNOSIS — N454 Abscess of epididymis or testis: Secondary | ICD-10-CM | POA: Diagnosis present

## 2023-12-05 DIAGNOSIS — I10 Essential (primary) hypertension: Secondary | ICD-10-CM

## 2023-12-05 DIAGNOSIS — I428 Other cardiomyopathies: Secondary | ICD-10-CM | POA: Diagnosis present

## 2023-12-05 DIAGNOSIS — Z823 Family history of stroke: Secondary | ICD-10-CM

## 2023-12-05 DIAGNOSIS — Z79899 Other long term (current) drug therapy: Secondary | ICD-10-CM

## 2023-12-05 DIAGNOSIS — E785 Hyperlipidemia, unspecified: Secondary | ICD-10-CM

## 2023-12-05 DIAGNOSIS — M1990 Unspecified osteoarthritis, unspecified site: Secondary | ICD-10-CM | POA: Diagnosis present

## 2023-12-05 DIAGNOSIS — Z885 Allergy status to narcotic agent status: Secondary | ICD-10-CM

## 2023-12-05 DIAGNOSIS — Z7982 Long term (current) use of aspirin: Secondary | ICD-10-CM

## 2023-12-05 DIAGNOSIS — R7303 Prediabetes: Secondary | ICD-10-CM | POA: Diagnosis present

## 2023-12-05 DIAGNOSIS — N2581 Secondary hyperparathyroidism of renal origin: Secondary | ICD-10-CM | POA: Diagnosis present

## 2023-12-05 DIAGNOSIS — I132 Hypertensive heart and chronic kidney disease with heart failure and with stage 5 chronic kidney disease, or end stage renal disease: Secondary | ICD-10-CM | POA: Diagnosis present

## 2023-12-05 DIAGNOSIS — B952 Enterococcus as the cause of diseases classified elsewhere: Secondary | ICD-10-CM | POA: Diagnosis present

## 2023-12-05 DIAGNOSIS — M109 Gout, unspecified: Secondary | ICD-10-CM | POA: Insufficient documentation

## 2023-12-05 DIAGNOSIS — I5022 Chronic systolic (congestive) heart failure: Secondary | ICD-10-CM | POA: Diagnosis present

## 2023-12-05 DIAGNOSIS — D631 Anemia in chronic kidney disease: Secondary | ICD-10-CM | POA: Diagnosis present

## 2023-12-05 DIAGNOSIS — L03314 Cellulitis of groin: Principal | ICD-10-CM

## 2023-12-05 DIAGNOSIS — N186 End stage renal disease: Secondary | ICD-10-CM

## 2023-12-05 DIAGNOSIS — K219 Gastro-esophageal reflux disease without esophagitis: Secondary | ICD-10-CM | POA: Insufficient documentation

## 2023-12-05 DIAGNOSIS — Z992 Dependence on renal dialysis: Secondary | ICD-10-CM

## 2023-12-05 DIAGNOSIS — Z8249 Family history of ischemic heart disease and other diseases of the circulatory system: Secondary | ICD-10-CM

## 2023-12-05 DIAGNOSIS — M898X9 Other specified disorders of bone, unspecified site: Secondary | ICD-10-CM | POA: Diagnosis present

## 2023-12-05 DIAGNOSIS — Z8673 Personal history of transient ischemic attack (TIA), and cerebral infarction without residual deficits: Secondary | ICD-10-CM

## 2023-12-05 NOTE — Addendum Note (Signed)
 Encounter addended by: Howell Rucks, RDCS on: 12/05/2023 7:22 AM  Actions taken: Imaging Exam ended

## 2023-12-05 NOTE — ED Triage Notes (Signed)
 Pt c/o scrotal abscess, reports he noticed it developing a few days ago. Hx of same.

## 2023-12-06 ENCOUNTER — Emergency Department (HOSPITAL_COMMUNITY)

## 2023-12-06 ENCOUNTER — Encounter (HOSPITAL_COMMUNITY): Payer: Self-pay | Admitting: Family Medicine

## 2023-12-06 DIAGNOSIS — M1990 Unspecified osteoarthritis, unspecified site: Secondary | ICD-10-CM | POA: Diagnosis present

## 2023-12-06 DIAGNOSIS — N492 Inflammatory disorders of scrotum: Secondary | ICD-10-CM

## 2023-12-06 DIAGNOSIS — Z885 Allergy status to narcotic agent status: Secondary | ICD-10-CM | POA: Diagnosis not present

## 2023-12-06 DIAGNOSIS — N454 Abscess of epididymis or testis: Secondary | ICD-10-CM | POA: Diagnosis present

## 2023-12-06 DIAGNOSIS — M898X9 Other specified disorders of bone, unspecified site: Secondary | ICD-10-CM | POA: Diagnosis present

## 2023-12-06 DIAGNOSIS — Z992 Dependence on renal dialysis: Secondary | ICD-10-CM | POA: Diagnosis not present

## 2023-12-06 DIAGNOSIS — I5022 Chronic systolic (congestive) heart failure: Secondary | ICD-10-CM | POA: Diagnosis present

## 2023-12-06 DIAGNOSIS — I10 Essential (primary) hypertension: Secondary | ICD-10-CM | POA: Diagnosis not present

## 2023-12-06 DIAGNOSIS — D631 Anemia in chronic kidney disease: Secondary | ICD-10-CM | POA: Diagnosis present

## 2023-12-06 DIAGNOSIS — N186 End stage renal disease: Secondary | ICD-10-CM | POA: Diagnosis present

## 2023-12-06 DIAGNOSIS — Z7982 Long term (current) use of aspirin: Secondary | ICD-10-CM | POA: Diagnosis not present

## 2023-12-06 DIAGNOSIS — R7303 Prediabetes: Secondary | ICD-10-CM | POA: Diagnosis present

## 2023-12-06 DIAGNOSIS — Z8249 Family history of ischemic heart disease and other diseases of the circulatory system: Secondary | ICD-10-CM | POA: Diagnosis not present

## 2023-12-06 DIAGNOSIS — B952 Enterococcus as the cause of diseases classified elsewhere: Secondary | ICD-10-CM | POA: Diagnosis present

## 2023-12-06 DIAGNOSIS — Z823 Family history of stroke: Secondary | ICD-10-CM | POA: Diagnosis not present

## 2023-12-06 DIAGNOSIS — E785 Hyperlipidemia, unspecified: Secondary | ICD-10-CM | POA: Diagnosis present

## 2023-12-06 DIAGNOSIS — Z8673 Personal history of transient ischemic attack (TIA), and cerebral infarction without residual deficits: Secondary | ICD-10-CM | POA: Diagnosis not present

## 2023-12-06 DIAGNOSIS — K219 Gastro-esophageal reflux disease without esophagitis: Secondary | ICD-10-CM | POA: Insufficient documentation

## 2023-12-06 DIAGNOSIS — N2581 Secondary hyperparathyroidism of renal origin: Secondary | ICD-10-CM | POA: Diagnosis present

## 2023-12-06 DIAGNOSIS — M109 Gout, unspecified: Secondary | ICD-10-CM | POA: Diagnosis present

## 2023-12-06 DIAGNOSIS — Z888 Allergy status to other drugs, medicaments and biological substances status: Secondary | ICD-10-CM | POA: Diagnosis not present

## 2023-12-06 DIAGNOSIS — Z79899 Other long term (current) drug therapy: Secondary | ICD-10-CM | POA: Diagnosis not present

## 2023-12-06 DIAGNOSIS — I428 Other cardiomyopathies: Secondary | ICD-10-CM | POA: Diagnosis present

## 2023-12-06 DIAGNOSIS — I132 Hypertensive heart and chronic kidney disease with heart failure and with stage 5 chronic kidney disease, or end stage renal disease: Secondary | ICD-10-CM | POA: Diagnosis present

## 2023-12-06 LAB — CBC WITH DIFFERENTIAL/PLATELET
Abs Immature Granulocytes: 0.08 10*3/uL — ABNORMAL HIGH (ref 0.00–0.07)
Basophils Absolute: 0 10*3/uL (ref 0.0–0.1)
Basophils Relative: 0 %
Eosinophils Absolute: 0.1 10*3/uL (ref 0.0–0.5)
Eosinophils Relative: 1 %
HCT: 34 % — ABNORMAL LOW (ref 39.0–52.0)
Hemoglobin: 11 g/dL — ABNORMAL LOW (ref 13.0–17.0)
Immature Granulocytes: 1 %
Lymphocytes Relative: 14 %
Lymphs Abs: 2.5 10*3/uL (ref 0.7–4.0)
MCH: 30 pg (ref 26.0–34.0)
MCHC: 32.4 g/dL (ref 30.0–36.0)
MCV: 92.6 fL (ref 80.0–100.0)
Monocytes Absolute: 1.1 10*3/uL — ABNORMAL HIGH (ref 0.1–1.0)
Monocytes Relative: 6 %
Neutro Abs: 13.8 10*3/uL — ABNORMAL HIGH (ref 1.7–7.7)
Neutrophils Relative %: 78 %
Platelets: 193 10*3/uL (ref 150–400)
RBC: 3.67 MIL/uL — ABNORMAL LOW (ref 4.22–5.81)
RDW: 15.9 % — ABNORMAL HIGH (ref 11.5–15.5)
WBC: 17.6 10*3/uL — ABNORMAL HIGH (ref 4.0–10.5)
nRBC: 0 % (ref 0.0–0.2)

## 2023-12-06 LAB — URINALYSIS, ROUTINE W REFLEX MICROSCOPIC
Bacteria, UA: NONE SEEN
Bilirubin Urine: NEGATIVE
Glucose, UA: 150 mg/dL — AB
Ketones, ur: NEGATIVE mg/dL
Leukocytes,Ua: NEGATIVE
Nitrite: NEGATIVE
Protein, ur: 100 mg/dL — AB
Specific Gravity, Urine: 1.009 (ref 1.005–1.030)
pH: 8 (ref 5.0–8.0)

## 2023-12-06 LAB — BASIC METABOLIC PANEL
Anion gap: 16 — ABNORMAL HIGH (ref 5–15)
BUN: 83 mg/dL — ABNORMAL HIGH (ref 6–20)
CO2: 19 mmol/L — ABNORMAL LOW (ref 22–32)
Calcium: 8.9 mg/dL (ref 8.9–10.3)
Chloride: 103 mmol/L (ref 98–111)
Creatinine, Ser: 18.94 mg/dL — ABNORMAL HIGH (ref 0.61–1.24)
GFR, Estimated: 3 mL/min — ABNORMAL LOW (ref >60)
Glucose, Bld: 106 mg/dL — ABNORMAL HIGH (ref 70–99)
Potassium: 5 mmol/L (ref 3.5–5.1)
Sodium: 138 mmol/L (ref 135–145)

## 2023-12-06 MED ORDER — MAGNESIUM HYDROXIDE 400 MG/5ML PO SUSP
30.0000 mL | Freq: Every day | ORAL | Status: DC | PRN
Start: 2023-12-06 — End: 2023-12-07

## 2023-12-06 MED ORDER — ALTEPLASE 2 MG IJ SOLR: 2.0000 mg | Freq: Once | INTRAMUSCULAR | Status: DC | PRN

## 2023-12-06 MED ORDER — HEPARIN SODIUM (PORCINE) 5000 UNIT/ML IJ SOLN
5000.0000 [IU] | Freq: Three times a day (TID) | INTRAMUSCULAR | Status: DC
  Administered 2023-12-06 – 2023-12-07 (×2): 5000 [IU] via SUBCUTANEOUS
  Filled 2023-12-06 (×2): qty 1

## 2023-12-06 MED ORDER — CARVEDILOL 3.125 MG PO TABS
6.2500 mg | ORAL_TABLET | Freq: Two times a day (BID) | ORAL | Status: DC
Start: 2023-12-06 — End: 2023-12-07
  Administered 2023-12-06 – 2023-12-07 (×2): 6.25 mg via ORAL
  Filled 2023-12-06 (×2): qty 2

## 2023-12-06 MED ORDER — CHLORHEXIDINE GLUCONATE CLOTH 2 % EX PADS
6.0000 | MEDICATED_PAD | Freq: Every day | CUTANEOUS | Status: DC
  Administered 2023-12-07: 6 via TOPICAL

## 2023-12-06 MED ORDER — TRAZODONE HCL 50 MG PO TABS: 25.0000 mg | ORAL_TABLET | Freq: Every evening | ORAL | Status: DC | PRN

## 2023-12-06 MED ORDER — ENOXAPARIN SODIUM 40 MG/0.4ML IJ SOSY: 40.0000 mg | PREFILLED_SYRINGE | INTRAMUSCULAR | Status: DC

## 2023-12-06 MED ORDER — HEPARIN SODIUM (PORCINE) 1000 UNIT/ML IJ SOLN
INTRAMUSCULAR | Status: AC
  Filled 2023-12-06: qty 7

## 2023-12-06 MED ORDER — HYDRALAZINE HCL 25 MG PO TABS
25.0000 mg | ORAL_TABLET | Freq: Three times a day (TID) | ORAL | Status: DC
  Administered 2023-12-06 – 2023-12-07 (×2): 25 mg via ORAL
  Filled 2023-12-06 (×3): qty 1

## 2023-12-06 MED ORDER — HEPARIN SODIUM (PORCINE) 1000 UNIT/ML DIALYSIS
20.0000 [IU]/kg | INTRAMUSCULAR | Status: DC | PRN
  Administered 2023-12-06: 2200 [IU] via INTRAVENOUS_CENTRAL

## 2023-12-06 MED ORDER — DIPHENHYDRAMINE HCL 25 MG PO CAPS
25.0000 mg | ORAL_CAPSULE | Freq: Three times a day (TID) | ORAL | Status: DC | PRN
  Administered 2023-12-06: 25 mg via ORAL
  Filled 2023-12-06: qty 1

## 2023-12-06 MED ORDER — PIPERACILLIN-TAZOBACTAM 3.375 G IVPB 30 MIN
3.3750 g | Freq: Once | INTRAVENOUS | Status: AC
  Administered 2023-12-06: 3.375 g via INTRAVENOUS
  Filled 2023-12-06: qty 50

## 2023-12-06 MED ORDER — LINEZOLID 600 MG/300ML IV SOLN
600.0000 mg | Freq: Two times a day (BID) | INTRAVENOUS | Status: DC
  Administered 2023-12-06 – 2023-12-07 (×3): 600 mg via INTRAVENOUS
  Filled 2023-12-06 (×6): qty 300

## 2023-12-06 MED ORDER — LIDOCAINE HCL (PF) 1 % IJ SOLN
10.0000 mL | Freq: Once | INTRAMUSCULAR | Status: AC
  Administered 2023-12-06: 10 mL
  Filled 2023-12-06: qty 10

## 2023-12-06 MED ORDER — LACTATED RINGERS IV SOLN: 150.0000 mL/h | INTRAVENOUS | Status: DC

## 2023-12-06 MED ORDER — HEPARIN SODIUM (PORCINE) 1000 UNIT/ML DIALYSIS
1000.0000 [IU] | INTRAMUSCULAR | Status: DC | PRN
  Administered 2023-12-06: 3800 [IU]

## 2023-12-06 MED ORDER — ATORVASTATIN CALCIUM 20 MG PO TABS
20.0000 mg | ORAL_TABLET | Freq: Every day | ORAL | Status: DC
  Administered 2023-12-06 – 2023-12-07 (×2): 20 mg via ORAL
  Filled 2023-12-06: qty 2
  Filled 2023-12-06: qty 1

## 2023-12-06 MED ORDER — ACETAMINOPHEN 650 MG RE SUPP: 650.0000 mg | Freq: Four times a day (QID) | RECTAL | Status: DC | PRN

## 2023-12-06 MED ORDER — HYDROMORPHONE HCL 1 MG/ML IJ SOLN: 1.0000 mg | Freq: Once | INTRAMUSCULAR | Status: DC

## 2023-12-06 MED ORDER — VANCOMYCIN HCL 2000 MG/400ML IV SOLN
2000.0000 mg | Freq: Once | INTRAVENOUS | Status: AC
  Administered 2023-12-06: 2000 mg via INTRAVENOUS
  Filled 2023-12-06: qty 400

## 2023-12-06 MED ORDER — PIPERACILLIN-TAZOBACTAM 3.375 G IVPB
3.3750 g | Freq: Two times a day (BID) | INTRAVENOUS | Status: DC
  Administered 2023-12-06 – 2023-12-07 (×3): 3.375 g via INTRAVENOUS
  Filled 2023-12-06 (×3): qty 50

## 2023-12-06 MED ORDER — HYDROMORPHONE HCL 1 MG/ML IJ SOLN
1.0000 mg | Freq: Once | INTRAMUSCULAR | Status: AC
  Administered 2023-12-06: 1 mg via INTRAVENOUS
  Filled 2023-12-06: qty 1

## 2023-12-06 MED ORDER — HYDROMORPHONE HCL 1 MG/ML IJ SOLN
1.0000 mg | INTRAMUSCULAR | Status: DC | PRN
  Administered 2023-12-06 (×2): 1 mg via INTRAVENOUS
  Filled 2023-12-06 (×2): qty 1

## 2023-12-06 MED ORDER — MORPHINE SULFATE (PF) 2 MG/ML IV SOLN
2.0000 mg | INTRAVENOUS | Status: DC | PRN
  Administered 2023-12-06: 2 mg via INTRAVENOUS
  Filled 2023-12-06: qty 1

## 2023-12-06 MED ORDER — ACETAMINOPHEN 325 MG PO TABS: 650.0000 mg | ORAL_TABLET | Freq: Four times a day (QID) | ORAL | Status: DC | PRN

## 2023-12-06 MED ORDER — SACUBITRIL-VALSARTAN 24-26 MG PO TABS
1.0000 | ORAL_TABLET | Freq: Two times a day (BID) | ORAL | Status: DC
Start: 2023-12-06 — End: 2023-12-07
  Administered 2023-12-06 – 2023-12-07 (×3): 1 via ORAL
  Filled 2023-12-06 (×3): qty 1

## 2023-12-06 MED ORDER — NEPRO/CARBSTEADY PO LIQD: 237.0000 mL | ORAL | Status: DC | PRN

## 2023-12-06 MED ORDER — ONDANSETRON HCL 4 MG/2ML IJ SOLN
4.0000 mg | Freq: Once | INTRAMUSCULAR | Status: AC
  Administered 2023-12-06: 4 mg via INTRAVENOUS
  Filled 2023-12-06: qty 2

## 2023-12-06 MED ORDER — VANCOMYCIN HCL IN DEXTROSE 1-5 GM/200ML-% IV SOLN: 1000.0000 mg | Freq: Once | INTRAVENOUS | Status: DC

## 2023-12-06 MED ORDER — ONDANSETRON 4 MG PO TBDP: 4.0000 mg | ORAL_TABLET | Freq: Three times a day (TID) | ORAL | Status: DC | PRN

## 2023-12-06 MED ORDER — ISOSORBIDE MONONITRATE ER 60 MG PO TB24
30.0000 mg | ORAL_TABLET | Freq: Every day | ORAL | Status: DC
  Administered 2023-12-06 – 2023-12-07 (×2): 30 mg via ORAL
  Filled 2023-12-06 (×2): qty 1

## 2023-12-06 MED ORDER — PIPERACILLIN-TAZOBACTAM 3.375 G IVPB 30 MIN: 3.3750 g | Freq: Once | INTRAVENOUS | Status: DC

## 2023-12-06 MED ORDER — HYDROMORPHONE HCL 1 MG/ML IJ SOLN: 0.5000 mg | Freq: Once | INTRAMUSCULAR | Status: DC

## 2023-12-06 MED ORDER — METOCLOPRAMIDE HCL 5 MG/ML IJ SOLN
10.0000 mg | Freq: Four times a day (QID) | INTRAMUSCULAR | Status: DC | PRN
  Administered 2023-12-06: 10 mg via INTRAVENOUS
  Filled 2023-12-06: qty 2

## 2023-12-06 NOTE — ED Provider Notes (Signed)
.  Incision and Drainage  Date/Time: 12/06/2023 10:27 AM  Performed by: Gareth Eagle, PA-C Authorized by: Gareth Eagle, PA-C   Consent:    Consent obtained:  Verbal   Consent given by:  Patient   Risks discussed:  Bleeding, incomplete drainage, pain and damage to other organs   Alternatives discussed:  No treatment Universal protocol:    Procedure explained and questions answered to patient or proxy's satisfaction: yes     Relevant documents present and verified: yes     Test results available : yes     Imaging studies available: yes     Required blood products, implants, devices, and special equipment available: yes     Site/side marked: yes     Immediately prior to procedure, a time out was called: yes     Patient identity confirmed:  Verbally with patient Location:    Type:  Abscess   Size:  2.5 cm   Location:  Anogenital   Anogenital location:  Scrotal space Pre-procedure details:    Skin preparation:  Betadine Sedation:    Sedation type:  None Anesthesia:    Anesthesia method:  Local infiltration   Local anesthetic:  Lidocaine 1% WITH epi Procedure type:    Complexity:  Complex Procedure details:    Incision types:  Single straight   Incision depth:  Subcutaneous   Wound management:  Probed and deloculated, irrigated with saline and extensive cleaning   Drainage:  Purulent   Drainage amount:  Copious   Packing materials:  1/4 in gauze Post-procedure details:    Procedure completion:  Tolerated well, no immediate complications     Gareth Eagle, PA-C 12/06/23 1028    Cathren Laine, MD 12/07/23 2112

## 2023-12-06 NOTE — Consult Note (Signed)
 [facility] Renal Consultation Note    Indication for Consultation:  Management of ESRD/hemodialysis; anemia, hypertension/volume and secondary hyperparathyroidism  HPI: Zachary Lynch is a 38 y.o. male with a PMH significant for HTN, HFrEF, h/o CVA, HLD, GERD, gout, prediabetes, and ESRD on HD TTS at [facility] who presented to [facility] ED on 12/05/23 complaining of 3 day history of scrotal pain and swelling.  He also reported fevers and chills.  He had a previous episode in November and required incision and drainage of a scrotal abscess with [facility].  In the ED, Temp 97.9, HR 82, Bp 138/99, RR 20, SpO2 99%.  Labs were notable for WBC 17.6, Hgb 11.  CT of pelvis without contrast revealed stranding and edema in the right scrotum consistent with cellulitis, however no abscess was noted.  The ED, MD performed and incision and drainage and retrieved a large amount of pus.  He is being admitted for further evaluation and treatment.  We were consulted to provide dialysis during his hospitalization.     Past Medical History:  Diagnosis Date   Arthritis    CKD stage 4 secondary to hypertension (HCC) 03/28/2017   Elevated blood uric acid level    Essential hypertension    Folliculitis    Managed with doxycycline, topical clindamycin/benzoyl peroxide   GERD (gastroesophageal reflux disease)    Gout    HFrEF (heart failure with reduced ejection fraction) (HCC)    Echo 2019 with EF 30-35%, hypokinesis   History of cardiomyopathy    LVEF 15% in 2010 - evaluated by [facility] and Dr. Graciela Husbands   History of stroke 2010   Right MCA distribution infarct, felt to be possibly embolic in the setting of cardiomyopathy - placed on Coumadin at that time   Hyperlipidemia    Nonischemic cardiomyopathy (HCC) 03/28/2017   Obesity    Prediabetes 05/03/2017   Past Surgical History:  Procedure Laterality Date   IR FLUORO GUIDE CV LINE RIGHT  05/17/2023   IR FLUORO GUIDE CV LINE RIGHT  06/01/2023    IR US GUIDE VASC ACCESS RIGHT  05/17/2023   RIGHT/LEFT HEART CATH AND CORONARY ANGIOGRAPHY N/A 05/18/2023   Procedure: RIGHT/LEFT HEART CATH AND CORONARY ANGIOGRAPHY;  Surgeon: [name], MD;  Location: [facility] INVASIVE CV LAB;  Service: Cardiovascular;  Laterality: N/A;   Family History:   Family History  Problem Relation Age of Onset   Stroke Cousin    Hypertension Mother    Heart attack Mother        Died age 13   Hypertension Father    Social History:  reports that he has never smoked. He has never used smokeless tobacco. He reports that he does not drink alcohol and does not use drugs. Allergies  Allergen Reactions   Ultram [Tramadol] Nausea Only    Disorientation    Ventolin [Albuterol] Other (See Comments)    Reaction is unknown. Allergy was entered post Stroke "caused him to have a stroke"     Prior to Admission medications   Medication Sig Start Date End Date Taking? Authorizing Provider  acetaminophen (TYLENOL) 325 MG tablet Take 2 tablets (650 mg total) by mouth every 4 (four) hours as needed for headache or mild pain. 05/21/23   Lonia Blood, MD  aspirin EC (ASPIRIN LOW DOSE) 81 MG tablet Take 1 tablet (81 mg total) by mouth daily. 05/30/23   Jacklynn Ganong, FNP  atorvastatin (LIPITOR) 20 MG tablet Take 1 tablet (20 mg  total) by mouth daily. 05/30/23   Milford, Anderson Malta, FNP  carvedilol (COREG) 6.25 MG tablet Take 1 tablet (6.25 mg total) by mouth 2 (two) times daily. 06/04/23     Darbepoetin Alfa (ARANESP) 100 MCG/0.5ML SOSY injection Inject 0.5 mLs (100 mcg total) into the skin every Friday at 6 PM. 05/27/23   Lonia Blood, MD  hydrALAZINE (APRESOLINE) 25 MG tablet Take 1 tablet (25 mg total) by mouth 3 (three) times daily. 05/30/23   Milford, Anderson Malta, FNP  isosorbide mononitrate (IMDUR) 30 MG 24 hr tablet Take 1 tablet (30 mg total) by mouth daily. 05/30/23   Milford, Anderson Malta, FNP  predniSONE (DELTASONE) 20 MG tablet Take 2 tablets (40 mg total) by mouth  daily for up to 5 days during gout flares 06/22/23     sacubitril-valsartan (ENTRESTO) 24-26 MG Take 1 tablet by mouth 2 (two) times daily. 05/21/23   Lonia Blood, MD   Current Facility-Administered Medications  Medication Dose Route Frequency Provider Last Rate Last Admin   acetaminophen (TYLENOL) tablet 650 mg  650 mg Oral Q6H PRN Mansy, Jan A, MD       Or   acetaminophen (TYLENOL) suppository 650 mg  650 mg Rectal Q6H PRN Mansy, Jan A, MD       atorvastatin (LIPITOR) tablet 20 mg  20 mg Oral Daily Mansy, Jan A, MD   20 mg at 12/06/23 1025   carvedilol (COREG) tablet 6.25 mg  6.25 mg Oral BID WC Mansy, Jan A, MD   6.25 mg at 12/06/23 1026   Chlorhexidine Gluconate Cloth 2 % PADS 6 each  6 each Topical Q0600 Terrial Rhodes, MD       heparin injection 5,000 Units  5,000 Units Subcutaneous Q8H Shahmehdi, Seyed A, MD       hydrALAZINE (APRESOLINE) tablet 25 mg  25 mg Oral TID Mansy, Jan A, MD   25 mg at 12/06/23 1025   HYDROmorphone (DILAUDID) injection 1 mg  1 mg Intravenous Q3H PRN Nevin Bloodgood A, MD   1 mg at 12/06/23 1049   isosorbide mononitrate (IMDUR) 24 hr tablet 30 mg  30 mg Oral Daily Mansy, Jan A, MD   30 mg at 12/06/23 1025   linezolid (ZYVOX) IVPB 600 mg  600 mg Intravenous Q12H Shahmehdi, Seyed A, MD   Stopped at 12/06/23 1200   magnesium hydroxide (MILK OF MAGNESIA) suspension 30 mL  30 mL Oral Daily PRN Mansy, Jan A, MD       metoCLOPramide (REGLAN) injection 10 mg  10 mg Intravenous Q6H PRN Mansy, Jan A, MD       piperacillin-tazobactam (ZOSYN) IVPB 3.375 g  3.375 g Intravenous Q12H Earnie Larsson, RPH 12.5 mL/hr at 12/06/23 1203 3.375 g at 12/06/23 1203   sacubitril-valsartan (ENTRESTO) 24-26 mg per tablet  1 tablet Oral BID Mansy, Jan A, MD   1 tablet at 12/06/23 1026   traZODone (DESYREL) tablet 25 mg  25 mg Oral QHS PRN Mansy, Vernetta Honey, MD       Current Outpatient Medications  Medication Sig Dispense Refill   acetaminophen (TYLENOL) 325 MG tablet Take 2 tablets  (650 mg total) by mouth every 4 (four) hours as needed for headache or mild pain.     aspirin EC (ASPIRIN LOW DOSE) 81 MG tablet Take 1 tablet (81 mg total) by mouth daily. 30 tablet 2   atorvastatin (LIPITOR) 20 MG tablet Take 1 tablet (20 mg total) by mouth daily. 30 tablet 2  carvedilol (COREG) 6.25 MG tablet Take 1 tablet (6.25 mg total) by mouth 2 (two) times daily. 60 tablet 11   Darbepoetin Alfa (ARANESP) 100 MCG/0.5ML SOSY injection Inject 0.5 mLs (100 mcg total) into the skin every Friday at 6 PM.     hydrALAZINE (APRESOLINE) 25 MG tablet Take 1 tablet (25 mg total) by mouth 3 (three) times daily. 90 tablet 2   isosorbide mononitrate (IMDUR) 30 MG 24 hr tablet Take 1 tablet (30 mg total) by mouth daily. 30 tablet 2   predniSONE (DELTASONE) 20 MG tablet Take 2 tablets (40 mg total) by mouth daily for up to 5 days during gout flares 30 tablet 0   sacubitril-valsartan (ENTRESTO) 24-26 MG Take 1 tablet by mouth 2 (two) times daily. 60 tablet 0   Labs: Basic Metabolic Panel: Recent Labs  Lab 12/06/23 0118  NA 138  K 5.0  CL 103  CO2 19*  GLUCOSE 106*  BUN 83*  CREATININE 18.94*  CALCIUM 8.9   Liver Function Tests: No results for input(s): "AST", "ALT", "ALKPHOS", "BILITOT", "PROT", "ALBUMIN" in the last 168 hours. No results for input(s): "LIPASE", "AMYLASE" in the last 168 hours. No results for input(s): "AMMONIA" in the last 168 hours. CBC: Recent Labs  Lab 12/06/23 0118  WBC 17.6*  NEUTROABS 13.8*  HGB 11.0*  HCT 34.0*  MCV 92.6  PLT 193   Cardiac Enzymes: No results for input(s): "CKTOTAL", "CKMB", "CKMBINDEX", "TROPONINI" in the last 168 hours. CBG: No results for input(s): "GLUCAP" in the last 168 hours. Iron Studies: No results for input(s): "IRON", "TIBC", "TRANSFERRIN", "FERRITIN" in the last 72 hours. Studies/Results: CT PELVIS WO CONTRAST Result Date: 12/06/2023 CLINICAL DATA:  Scrotal abscess and edema. EXAM: CT PELVIS WITHOUT CONTRAST TECHNIQUE:  Multidetector CT imaging of the pelvis was performed following the standard protocol without intravenous contrast. RADIATION DOSE REDUCTION: This exam was performed according to the departmental dose-optimization program which includes automated exposure control, adjustment of the mA and/or kV according to patient size and/or use of iterative reconstruction technique. COMPARISON:  CT pelvis 05/17/2023 FINDINGS: Urinary Tract:  No abnormality visualized. Bowel:  Unremarkable visualized pelvic bowel loops. Vascular/Lymphatic: No pathologically enlarged lymph nodes. No significant vascular abnormality seen. Reproductive: Stranding and edema in the right scrotum. No drainable abscess. The right testicle is mildly heterogenous suspicious for orchitis. No soft tissue gas. Other:  No free intraperitoneal fluid or air. Musculoskeletal: No acute fracture. IMPRESSION: 1. Stranding and edema in the right scrotum are nonspecific but compatible with cellulitis. No drainable abscess. No soft tissue gas. 2. The right testicle is mildly heterogenous suspicious for orchitis. Electronically Signed   By: Minerva Fester M.D.   On: 12/06/2023 02:07    ROS: Pertinent items are noted in HPI. Physical Exam: Vitals:   12/06/23 1000 12/06/23 1030 12/06/23 1206 12/06/23 1212  BP: (!) 164/111 (!) 158/106 133/78   Pulse: 80 81 76   Resp: 15 13 16    Temp:    98.1 F (36.7 C)  TempSrc:    Oral  SpO2: 99% 100% 99%       Weight change:   Intake/Output Summary (Last 24 hours) at 12/06/2023 1253 Last data filed at 12/06/2023 1610 Gross per 24 hour  Intake --  Output 525 ml  Net -525 ml   BP 133/78 (BP Location: Right Arm)   Pulse 76   Temp 98.1 F (36.7 C) (Oral)   Resp 16   SpO2 99%  General appearance: alert, cooperative, and mild distress  Head: Normocephalic, without obvious abnormality, atraumatic Eyes: negative findings: lids and lashes normal, conjunctivae and sclerae normal, and corneas clear Resp: clear to  auscultation bilaterally Cardio: regular rate and rhythm, S1, S2 normal, no murmur, click, rub or gallop GI: soft, non-tender; bowel sounds normal; no masses,  no organomegaly Male genitalia: abscess of right scrotum Extremities: extremities normal, atraumatic, no cyanosis or edema Dialysis Access:  Dialysis Orders: Center:  [facility]  on TTS . EDW 109 kg HD Bath 2K/2.5Ca  Time 4:00 Heparin 1000 units bolus then 1000 units/hr. Access RIJ TDC BFR 400 DFR 600    Calcitriol 1.75 mcg po/HD  Micera 30 mcg IV q2 weeks  Assessment/Plan:  Scrotal abscess - recurrent issue.  S/p I&D by ED MD.  Antibiotics per primary svc.  Await urology consultation.  ESRD -  Will plan for HD today to keep on outpatient schedule.  Hypertension/volume  - stable  Anemia  - stable, no need for ESA at this time  Metabolic bone disease -   continue with his home medications  Nutrition - renal diet.   HFrEF - appears euvolemic  Irena Cords, MD [facility], [facility] 12/06/2023, 12:53 PM

## 2023-12-06 NOTE — Progress Notes (Signed)
   Nephrology Nursing Note:   HBV serology obtained from Care Everywhere:  - Infection/Serology Infection/Serology Draw Date Description Result/Unit Interpretation Ref Range  29 Nov 2023 Hepatitis B virus surface Ag:PrThr:Pt:Ser/Plas:Ord:IA: NEG   NEG    Arman Filter, RN

## 2023-12-06 NOTE — Assessment & Plan Note (Signed)
 -  We will continue statin therapy.

## 2023-12-06 NOTE — Consult Note (Addendum)
 Urology Consult  Consulting MD: Gloris Manchester, MD  CC: Scrotal pain  HPI: This is a 38year old male who presented to the emergency room with several day history of scrotal swelling.  He does have a history of multiple procedures for scrotal abscesses, most recently in December 2024 with [facility].  The patient for the past 3 to 4 days has had painful swelling of his right testicle reminiscent of prior episodes.  Evaluation included CT scan of pelvis (probable right orchitis, soft tissue swelling in right hemiscrotum without evidence of gas, no evidence of abscess), CBC (white blood count 17.6).  Urologic consultation is requested.  Due to his multiple medical issues he is to be admitted to the hospital for antibiotic management. PMH: Past Medical History:  Diagnosis Date   Arthritis    CKD stage 4 secondary to hypertension (HCC) 03/28/2017   Elevated blood uric acid level    Essential hypertension    Folliculitis    Managed with doxycycline, topical clindamycin/benzoyl peroxide   GERD (gastroesophageal reflux disease)    Gout    HFrEF (heart failure with reduced ejection fraction) (HCC)    Echo 2019 with EF 30-35%, hypokinesis   History of cardiomyopathy    LVEF 15% in 2010 - evaluated by [facility] and Dr. Graciela Husbands   History of stroke 2010   Right MCA distribution infarct, felt to be possibly embolic in the setting of cardiomyopathy - placed on Coumadin at that time   Hyperlipidemia    Nonischemic cardiomyopathy (HCC) 03/28/2017   Obesity    Prediabetes 05/03/2017    PSH: Past Surgical History:  Procedure Laterality Date   IR FLUORO GUIDE CV LINE RIGHT  05/17/2023   IR FLUORO GUIDE CV LINE RIGHT  06/01/2023   IR US GUIDE VASC ACCESS RIGHT  05/17/2023   RIGHT/LEFT HEART CATH AND CORONARY ANGIOGRAPHY N/A 05/18/2023   Procedure: RIGHT/LEFT HEART CATH AND CORONARY ANGIOGRAPHY;  Surgeon: [name], MD;  Location: [facility] INVASIVE CV LAB;  Service: Cardiovascular;   Laterality: N/A;    Allergies: Allergies  Allergen Reactions   Ultram [Tramadol] Nausea Only    Disorientation    Ventolin [Albuterol] Other (See Comments)    Reaction is unknown. Allergy was entered post Stroke "caused him to have a stroke"      Medications: (Not in a hospital admission)    Social History: Social History   Socioeconomic History   Marital status: Single    Spouse name: Not on file   Number of children: Not on file   Years of education: Not on file   Highest education level: Associate degree: occupational, Scientist, product/process development, or vocational program  Occupational History   Occupation: Print production planner  Tobacco Use   Smoking status: Never   Smokeless tobacco: Never  Vaping Use   Vaping status: Never Used  Substance and Sexual Activity   Alcohol use: No    Alcohol/week: 0.0 standard drinks of alcohol   Drug use: No   Sexual activity: Not Currently  Other Topics Concern   Not on file  Social History Narrative   Not on file   Social Drivers of Health   Financial Resource Strain: Low Risk  (09/09/2023)   Received from [facility]   Overall Financial Resource Strain (CARDIA)    Difficulty of Paying Living Expenses: Not hard at all  Food Insecurity: No Food Insecurity (09/09/2023)   Received from [facility]   Hunger Vital Sign    Worried About Running Out of Food  in the Last Year: Never true    Ran Out of Food in the Last Year: Never true  Transportation Needs: No Transportation Needs (09/09/2023)   Received from [facility] - Transportation    Lack of Transportation (Medical): No    Lack of Transportation (Non-Medical): No  Physical Activity: Unknown (01/13/2023)   Exercise Vital Sign    Days of Exercise per Week: 0 days    Minutes of Exercise per Session: Not on file  Stress: Stress Concern Present (01/13/2023)   [employer] of Occupational [employer] - Occupational Stress Questionnaire    Feeling of Stress : To some extent  Social  Connections: Unknown (07/14/2023)   Received from [facility]   Social Network    Social Network: Not on file  Intimate Partner Violence: Unknown (07/14/2023)   Received from [facility]   HITS    Physically Hurt: Not on file    Insult or Talk Down To: Not on file    Threaten Physical Harm: Not on file    Scream or Curse: Not on file    Family History: Family History  Problem Relation Age of Onset   Stroke Cousin    Hypertension Mother    Heart attack Mother        Died age 73   Hypertension Father     Review of Systems: Positive: Scrotal pain Negative: .  A further 10 point review of systems was negative except what is listed in the HPI.  Physical Exam: @VITALS2 @ General: No acute distress.  Awake. Head:  Normocephalic.  Atraumatic. ENT:  EOMI.  Mucous membranes moist Neck:  Supple.  No lymphadenopathy. CV:  Regular rate. Pulmonary: Equal effort bilaterally.   Skin:  Normal turgor.  No visible rash. Extremity: No gross deformity of extremities.  Neurologic: Alert. Appropriate mood. Penis:  Circumcised.  No lesions. Urethra: Orthotopic meatus. Scrotum: Obvious anterior scrotal abscess approximately 3 cm in diameter.  Tender, indurated right testicle.  No crepitus noted.  Left testicle normal.   Studies:  Recent Labs    12/06/23 0118  HGB 11.0*  WBC 17.6*  PLT 193    Recent Labs    12/06/23 0118  NA 138  K 5.0  CL 103  CO2 19*  BUN 83*  CREATININE 18.94*  CALCIUM 8.9  GFRNONAA 3*     No results for input(s): "INR", "APTT" in the last 72 hours.  Invalid input(s): "PT"   Invalid input(s): "ABG"  CT images reviewed  Prior MD notes reviewed  Assessment: Recurrent scrotal abscess, currently this abscess seems to be pointing  Plan: I do agree with drainage of the abscess.  I have asked emergency room staff to assist with this  I also agree with inpatient management, especially considering his medical issues including end-stage renal  disease  Please contact us during his hospitalization to further follow-up needed    Pager:[phone]

## 2023-12-06 NOTE — TOC CM/SW Note (Signed)
 Transition of Care [facility]) - Inpatient Brief Assessment   Patient Details  Name: MUSTAF ANTONACCI MRN: [id number] Date of Birth: 1986-04-22  Transition of Care [facility]) CM/SW Contact:    Villa Herb, LCSWA Phone Number: 12/06/2023, 9:46 AM  Clinical Narrative: Transition of Care Department [facility]) has reviewed patient and no TOC needs have been identified at this time. We will continue to monitor patient advancement through interdiciplinary progression rounds. If new patient transition needs arise, please place a TOC consult.   Transition of Care Asessment: Insurance and Status: Insurance coverage has been reviewed Patient has primary care physician: No (PCP list added to AVS) Home environment has been reviewed: From home Prior level of function:: Independent Prior/Current Home Services: No current home services Social Drivers of Health Review: SDOH reviewed no interventions necessary Readmission risk has been reviewed: Yes Transition of care needs: no transition of care needs at this time

## 2023-12-06 NOTE — Assessment & Plan Note (Signed)
-   We will continue  PPI and H2 blocker therapy. 

## 2023-12-06 NOTE — H&P (Signed)
 [place]   PATIENT NAME: Zachary Lynch    MR#:  [id number]  DATE OF BIRTH:  09-16-1986  DATE OF ADMISSION:  12/05/2023  PRIMARY CARE PHYSICIAN: Pcp, No   Patient is coming from: Home  REQUESTING/REFERRING PHYSICIAN: Gloris Manchester, MD  CHIEF COMPLAINT:   Chief Complaint  Patient presents with  . Abscess    HISTORY OF PRESENT ILLNESS:  Zachary Lynch is a 38 y.o. 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  He admitted to fever and chills.  No nausea or vomiting or abdominal pain.  No dysuria, oliguria or hematuria or flank pain.  No cough or wheezing or dyspnea.  He denies any drainage.  ED Course: When he came to the ER, BP was 157/124 with otherwise normal vital signs.  Labs revealed CO2 19 and blood glucose of 106 with a BUN of 83 and creatinine at 10.94 and anion gap of 16.  CBC showed leukocytosis 17.6 with neutrophilia as well as anemia.  UA was remarkable for 150 glucose. EKG as reviewed by me : EKG showed normal sinus rhythm with a rate of 89 with pulmonary progression Imaging: Pelvic CT without contrast revealed the following 1. Stranding and edema in the right scrotum are nonspecific but compatible with cellulitis. No drainable abscess. No soft tissue gas. 2. The right testicle is mildly heterogenous suspicious for orchitis. The patient was given 1 mg of IV Dilaudid, 4 mg of IV Zofran, IV vancomycin and Zosyn.  Contact was made with Dr. Jennette Bill who recommended admission to [facility] and his urology team will evaluate the patient there.  He will be admitted to a medical telemetry bed at [facility] for further evaluation and management. PAST MEDICAL HISTORY:   Past Medical History:  Diagnosis Date  .  Arthritis   . CKD stage 4 secondary to hypertension (HCC) 03/28/2017  . Elevated blood uric acid level   . Essential hypertension   . Folliculitis    Managed with doxycycline, topical clindamycin/benzoyl peroxide  . GERD (gastroesophageal reflux disease)   . Gout   . HFrEF (heart failure with reduced ejection fraction) (HCC)    Echo 2019 with EF 30-35%, hypokinesis  . History of cardiomyopathy    LVEF 15% in 2010 - evaluated by [facility] and Dr. Graciela Husbands  . History of stroke 2010   Right MCA distribution infarct, felt to be possibly embolic in the setting of cardiomyopathy - placed on Coumadin at that time  . Hyperlipidemia   . Nonischemic cardiomyopathy (HCC) 03/28/2017  . Obesity   . Prediabetes 05/03/2017    PAST SURGICAL HISTORY:   Past Surgical History:  Procedure Laterality Date  . IR FLUORO GUIDE CV LINE RIGHT  05/17/2023  . IR FLUORO GUIDE CV LINE RIGHT  06/01/2023  . IR US GUIDE VASC ACCESS RIGHT  05/17/2023  . RIGHT/LEFT HEART CATH AND CORONARY ANGIOGRAPHY N/A 05/18/2023   Procedure: RIGHT/LEFT HEART CATH AND CORONARY ANGIOGRAPHY;  Surgeon: [name], MD;  Location: [facility] INVASIVE CV LAB;  Service: Cardiovascular;  Laterality: N/A;    SOCIAL HISTORY:   Social History   Tobacco Use  . Smoking status: Never  . Smokeless tobacco: Never  Substance Use Topics  . Alcohol use: No  Alcohol/week: 0.0 standard drinks of alcohol    FAMILY HISTORY:   Family History  Problem Relation Age of Onset  . Stroke Cousin   . Hypertension Mother   . Heart attack Mother        Died age 39  . Hypertension Father     DRUG ALLERGIES:   Allergies  Allergen Reactions  . Ultram [Tramadol] Nausea Only    Disorientation   . Ventolin [Albuterol] Other (See Comments)    Reaction is unknown. Allergy was entered post Stroke "caused him to have a stroke"      REVIEW OF SYSTEMS:   ROS As per history of present illness. All pertinent systems were reviewed above. Constitutional,  HEENT, cardiovascular, respiratory, GI, GU, musculoskeletal, neuro, psychiatric, endocrine, integumentary and hematologic systems were reviewed and are otherwise negative/unremarkable except for positive findings mentioned above in the HPI.   MEDICATIONS AT HOME:   Prior to Admission medications   Medication Sig Start Date End Date Taking? Authorizing Provider  acetaminophen (TYLENOL) 325 MG tablet Take 2 tablets (650 mg total) by mouth every 4 (four) hours as needed for headache or mild pain. 05/21/23   Lonia Blood, MD  aspirin EC (ASPIRIN LOW DOSE) 81 MG tablet Take 1 tablet (81 mg total) by mouth daily. 05/30/23   Milford, Anderson Malta, FNP  atorvastatin (LIPITOR) 20 MG tablet Take 1 tablet (20 mg total) by mouth daily. 05/30/23   Milford, Anderson Malta, FNP  carvedilol (COREG) 6.25 MG tablet Take 1 tablet (6.25 mg total) by mouth 2 (two) times daily. 06/04/23     Darbepoetin Alfa (ARANESP) 100 MCG/0.5ML SOSY injection Inject 0.5 mLs (100 mcg total) into the skin every Friday at 6 PM. 05/27/23   Lonia Blood, MD  hydrALAZINE (APRESOLINE) 25 MG tablet Take 1 tablet (25 mg total) by mouth 3 (three) times daily. 05/30/23   Milford, Anderson Malta, FNP  isosorbide mononitrate (IMDUR) 30 MG 24 hr tablet Take 1 tablet (30 mg total) by mouth daily. 05/30/23   Milford, Anderson Malta, FNP  predniSONE (DELTASONE) 20 MG tablet Take 2 tablets (40 mg total) by mouth daily for up to 5 days during gout flares 06/22/23     sacubitril-valsartan (ENTRESTO) 24-26 MG Take 1 tablet by mouth 2 (two) times daily. 05/21/23   Lonia Blood, MD      VITAL SIGNS:  Blood pressure 123/85, pulse 77, temperature 98.4 F (36.9 C), temperature source Oral, resp. rate 19, SpO2 98%.  PHYSICAL EXAMINATION:  Physical Exam  GENERAL:  38 y.o.-year-old patient lying in the bed with no acute distress.  EYES: Pupils equal, round, reactive to light and accommodation. No scleral icterus. Extraocular muscles intact.  HEENT: Head atraumatic,  normocephalic. Oropharynx and nasopharynx clear.  NECK:  Supple, no jugular venous distention. No thyroid enlargement, no tenderness.  LUNGS: Normal breath sounds bilaterally, no wheezing, rales,rhonchi or crepitation. No use of accessory muscles of respiration.  CARDIOVASCULAR: Regular rate and rhythm, S1, S2 normal. No murmurs, rubs, or gallops.  ABDOMEN: Soft, nondistended, nontender. Bowel sounds present. No organomegaly or mass.  EXTREMITIES: No pedal edema, cyanosis, or clubbing.  NEUROLOGIC: Cranial nerves II through XII are intact. Muscle strength 5/5 in all extremities. Sensation intact. Gait not checked.  PSYCHIATRIC: The patient is alert and oriented x 3.  Normal affect and good eye contact. SKIN: No obvious rash, lesion, or ulcer.  GU: Right scrotal swelling with exquisite tenderness and mild fluctuation without drainage. LABORATORY PANEL:   CBC Recent  Labs  Lab 12/06/23 0118  WBC 17.6*  HGB 11.0*  HCT 34.0*  PLT 193   ------------------------------------------------------------------------------------------------------------------  Chemistries  Recent Labs  Lab 12/06/23 0118  NA 138  K 5.0  CL 103  CO2 19*  GLUCOSE 106*  BUN 83*  CREATININE 18.94*  CALCIUM 8.9   ------------------------------------------------------------------------------------------------------------------  Cardiac Enzymes No results for input(s): "TROPONINI" in the last 168 hours. ------------------------------------------------------------------------------------------------------------------  RADIOLOGY:  CT PELVIS WO CONTRAST Result Date: 12/06/2023 CLINICAL DATA:  Scrotal abscess and edema. EXAM: CT PELVIS WITHOUT CONTRAST TECHNIQUE: Multidetector CT imaging of the pelvis was performed following the standard protocol without intravenous contrast. RADIATION DOSE REDUCTION: This exam was performed according to the departmental dose-optimization program which includes automated exposure  control, adjustment of the mA and/or kV according to patient size and/or use of iterative reconstruction technique. COMPARISON:  CT pelvis 05/17/2023 FINDINGS: Urinary Tract:  No abnormality visualized. Bowel:  Unremarkable visualized pelvic bowel loops. Vascular/Lymphatic: No pathologically enlarged lymph nodes. No significant vascular abnormality seen. Reproductive: Stranding and edema in the right scrotum. No drainable abscess. The right testicle is mildly heterogenous suspicious for orchitis. No soft tissue gas. Other:  No free intraperitoneal fluid or air. Musculoskeletal: No acute fracture. IMPRESSION: 1. Stranding and edema in the right scrotum are nonspecific but compatible with cellulitis. No drainable abscess. No soft tissue gas. 2. The right testicle is mildly heterogenous suspicious for orchitis. Electronically Signed   By: Minerva Fester M.D.   On: 12/06/2023 02:07      IMPRESSION AND PLAN:  Assessment and Plan: * Scrotal abscess - This is associated with scrotal cellulitis with subsequent sepsis. - The patient was admitted to a medical telemetry bed at [facility]. - Pain management will be provided. - We will continue him on IV vancomycin and Zosyn. - Urology consult will be obtained. - Dr. Jennette Bill was notified about the patient and is aware.  His team will evaluate the patient today.  End-stage renal disease on hemodialysis [facility]) - The patient will need a nephrology consult at [facility] for follow-up on hemodialysis. - She receives his hemodialysis on Tuesdays, Thursdays and Saturdays.  Essential hypertension - Continue antihypertensive therapy.  Dyslipidemia - We will continue statin therapy.  Gout - We will continue allopurinol.  GERD without esophagitis - We will continue PPI and H2 blocker therapy.   DVT prophylaxis: Lovenox.  Advanced Care Planning:  Code Status: full code.  Family Communication:  The plan of care was discussed in details with the patient (and family). I  answered all questions. The patient agreed to proceed with the above mentioned plan. Further management will depend upon hospital course. Disposition Plan: Back to previous home environment Consults called: Urology All the records are reviewed and case discussed with ED provider.  Status is: Inpatient  At the time of the admission, it appears that the appropriate admission status for this patient is inpatient.  This is judged to be reasonable and necessary in order to provide the required intensity of service to ensure the patient's safety given the presenting symptoms, physical exam findings and initial radiographic and laboratory data in the context of comorbid conditions.  The patient requires inpatient status due to high intensity of service, high risk of further deterioration and high frequency of surveillance required.  I certify that at the time of admission, it is my clinical judgment that the patient will require inpatient hospital care extending more than 2 midnights.  Dispo: The patient is from: Home              Anticipated d/c is to: Home              Patient currently is not medically stable to d/c.              Difficult to place patient: No  Hannah Beat M.D on 12/06/2023 at 7:12 AM  [facility]   From 7 PM-7 AM, contact night-coverage [email or web address]  CC: Primary care physician; Pcp, No

## 2023-12-06 NOTE — Plan of Care (Signed)
   Problem: Fluid Volume: Goal: Hemodynamic stability will improve Outcome: Progressing   Problem: Clinical Measurements: Goal: Diagnostic test results will improve Outcome: Progressing Goal: Signs and symptoms of infection will decrease Outcome: Progressing   Problem: Respiratory: Goal: Ability to maintain adequate ventilation will improve Outcome: Progressing

## 2023-12-06 NOTE — Assessment & Plan Note (Signed)
-   This is associated with scrotal cellulitis with subsequent sepsis. - The patient was admitted to a medical telemetry bed at [facility]. - Pain management will be provided. - We will continue him on IV vancomycin and Zosyn. - Urology consult will be obtained. - Dr. Jennette Bill was notified about the patient and is aware.  His team will evaluate the patient today.

## 2023-12-06 NOTE — ED Notes (Signed)
Pt in room sleeping at this time.

## 2023-12-06 NOTE — Progress Notes (Addendum)
 Pharmacy Antibiotic Note  Zachary Lynch is a 38 y.o. male admitted on 12/05/2023 with cellulitis/abscess.  Pharmacy has been consulted for vancomycin dosing.  Patient loaded with vancomycin overnight, currently afebrile, wbc 17. Scr 18.94 ESRD on HD TTS.   Addendum; will adjust antibiotics to zosyn and linezolid   Plan: Patient loaded with 2g of vancomyicn overnight Continue with linzolid 600mg  IV q12 hours - no adjustment needed Zosyn 3.375g IV q12 hours - infuse each dose over 4 hours   Temp (24hrs), Avg:98.2 F (36.8 C), Min:98 F (36.7 C), Max:98.4 F (36.9 C)  Recent Labs  Lab 12/06/23 0118  WBC 17.6*  CREATININE 18.94*    CrCl cannot be calculated (Unknown ideal weight.).    Allergies  Allergen Reactions   Ultram [Tramadol] Nausea Only    Disorientation    Ventolin [Albuterol] Other (See Comments)    Reaction is unknown. Allergy was entered post Stroke "caused him to have a stroke"     Thank you for allowing pharmacy to be a part of this patient's care.  Sheppard Coil PharmD., BCPS Clinical Pharmacist 12/06/2023 8:03 AM

## 2023-12-06 NOTE — Progress Notes (Signed)
  HEMODIALYSIS TREATMENT NOTE:  Uneventful 4 hour low-heparin treatment completed using right internal jugular TDC. Cath exit site is unremarkable.  Goal met: 1.5 liters removed to ultrafiltrate to EDW.  All blood was returned.  Post-HD:  12/06/23 2225  Vital Signs  Temp 98.8 F (37.1 C)  Temp Source Oral  Pulse Rate 79  Pulse Rate Source Monitor  Resp 18  BP 114/70  BP Location Right Arm  BP Method Automatic  Patient Position (if appropriate) Lying  Oxygen Therapy  SpO2 100 %  O2 Device Room Air  Dialysis Weight  Weight 109 kg  Type of Weight Post-Dialysis  Post Treatment  Dialyzer Clearance Lightly streaked  Hemodialysis Intake (mL) 0 mL  Liters Processed 95.2  Fluid Removed (mL) 1500 mL  Tolerated HD Treatment Yes  Post-Hemodialysis Comments Goal met.  At EDW    Arman Filter, RN [facility]

## 2023-12-06 NOTE — Hospital Course (Addendum)
 Zachary Lynch is a 38 y.o. 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  He admitted to fever and chills. He denies any drainage.   ED Course: BP 157/124 with otherwise normal vital signs.  Labs revealed CO2 19 and blood glucose of 106 with a BUN of 83 and creatinine at 10.94 and anion gap of 16.  CBC showed leukocytosis 17.6 with neutrophilia as well as anemia.  UA was remarkable for 150 glucose. EKG: EKG showed normal sinus rhythm with a rate of 89 with pulmonary progression Imaging: Pelvic CT without contrast revealed the following 1. Stranding and edema in the right scrotum are nonspecific but compatible with cellulitis. No drainable abscess. No soft tissue gas. 2. The right testicle is mildly heterogenous suspicious for orchitis. The patient was given 1 mg of IV Dilaudid, 4 mg of IV Zofran, IV vancomycin and Zosyn.  Contact was made with Dr. Jennette Bill who recommended admission to [facility] and his urology team will evaluate the patient there.  He will be admitted to a medical telemetry bed at [facility] for further evaluation and management.     Assessment & Plan:   Principal Problem:   Scrotal abscess Active Problems:   End-stage renal disease on hemodialysis (HCC)   Essential hypertension   Dyslipidemia   GERD without esophagitis   Gout   Testicular abscess Assessment and Plan:  * Scrotal abscess -Scrotal cellulitis/abscess - Pending urology evaluation possible I&D, will follow-up with fluid cultures obtained -Continue current antibiotics, was initiated on vancomycin and Zosyn, Vanco was substituted with Zyvox   - Pain management will be provided. - We will continue him on IV vancomycin and Zosyn. - Urology consult  will be obtained. - Dr. Jennette Bill was notified about the patient and is aware.  His team will evaluate the patient today.   -Called and discussed with urologist Dr. Dennard Nip Bell-reviewed the images and will see if urology can see the patient either at [facility] or [facility] This point plan is to I&D the patient and ED, likely will stay at [facility]   End-stage renal disease on hemodialysis [facility]) - The patient will need a nephrology consult at [facility] for follow-up on hemodialysis. - She receives his hemodialysis on Tuesdays, Thursdays and Saturdays. Discussed with nephrologist, patient will proceed with hemodialysis today Dr. Arrie Aran  Essential hypertension - Continue antihypertensive therapy.   Dyslipidemia - We will continue statin therapy.   Gout - We will continue allopurinol.   GERD without esophagitis - We will continue PPI and H2 blocker therapy.

## 2023-12-06 NOTE — Assessment & Plan Note (Signed)
 -  We will continue allopurinol

## 2023-12-06 NOTE — Assessment & Plan Note (Signed)
 -  Continue antihypertensive therapy ?

## 2023-12-06 NOTE — Discharge Instructions (Signed)

## 2023-12-06 NOTE — Progress Notes (Signed)
 ED Pharmacy Antibiotic Sign Off An antibiotic consult was received from an ED provider for vancomycin and zosyn per pharmacy dosing for cellulitis. A chart review was completed to assess appropriateness.   The following one time order(s) were placed:  Vancomycin 2g Zosyn 3.375g  Further antibiotic and/or antibiotic pharmacy consults should be ordered by the admitting provider if indicated.   Thank you for allowing pharmacy to be a part of this patient's care.   Marja Kays, [facility]  Clinical Pharmacist 12/06/23 3:29 AM

## 2023-12-06 NOTE — ED Provider Notes (Signed)
 [place] EMERGENCY DEPARTMENT AT [facility] Provider Note   CSN: [id number] Arrival date & time: 12/05/23  2150     History  Chief Complaint  Patient presents with   Abscess    Zachary Lynch is a 38 y.o. male.   Abscess Patient presents for scrotal abscess.  Medical history includes ESRD, CVA, prediabetes, HTN, GERD, gout, CHF, HLD.  He undergoes dialysis on Tuesday, Thursday, Saturday.  He missed on Saturday.  His last session was on Thursday.  He does continue to make urine.  In November of last year, he underwent incision and drainage of scrotal abscess with [facility] urology.  He was prescribed doxycycline at that time.  His abscess resolved.  4 days ago, he noticed a swelling in the same location as his prior abscess.  Since that time, swelling and pain is worsened.  He has not seen any drainage.  He denies any fevers or chills.  He describes his current pain as a 1000 out of 10.     Home Medications Prior to Admission medications   Medication Sig Start Date End Date Taking? Authorizing Provider  acetaminophen (TYLENOL) 325 MG tablet Take 2 tablets (650 mg total) by mouth every 4 (four) hours as needed for headache or mild pain. 05/21/23   Lonia Blood, MD  aspirin EC (ASPIRIN LOW DOSE) 81 MG tablet Take 1 tablet (81 mg total) by mouth daily. 05/30/23   Milford, Anderson Malta, FNP  atorvastatin (LIPITOR) 20 MG tablet Take 1 tablet (20 mg total) by mouth daily. 05/30/23   Milford, Anderson Malta, FNP  carvedilol (COREG) 6.25 MG tablet Take 1 tablet (6.25 mg total) by mouth 2 (two) times daily. 06/04/23     Darbepoetin Alfa (ARANESP) 100 MCG/0.5ML SOSY injection Inject 0.5 mLs (100 mcg total) into the skin every Friday at 6 PM. 05/27/23   Lonia Blood, MD  hydrALAZINE (APRESOLINE) 25 MG tablet Take 1 tablet (25 mg total) by mouth 3 (three) times daily. 05/30/23   Milford, Anderson Malta, FNP  isosorbide mononitrate (IMDUR) 30 MG 24 hr tablet Take 1 tablet (30 mg total) by mouth  daily. 05/30/23   Milford, Anderson Malta, FNP  predniSONE (DELTASONE) 20 MG tablet Take 2 tablets (40 mg total) by mouth daily for up to 5 days during gout flares 06/22/23     sacubitril-valsartan (ENTRESTO) 24-26 MG Take 1 tablet by mouth 2 (two) times daily. 05/21/23   Lonia Blood, MD      Allergies    Ultram [tramadol] and Ventolin [albuterol]    Review of Systems   Review of Systems  Genitourinary:  Positive for scrotal swelling.  All other systems reviewed and are negative.   Physical Exam Updated Vital Signs BP (!) 138/99 (BP Location: Left Arm)   Pulse 82   Temp 97.9 F (36.6 C) (Oral)   Resp 20   SpO2 99%  Physical Exam Vitals and nursing note reviewed. Exam conducted with a chaperone present.  Constitutional:      General: He is not in acute distress.    Appearance: Normal appearance. He is well-developed. He is not ill-appearing, toxic-appearing or diaphoretic.  HENT:     Head: Normocephalic and atraumatic.     Right Ear: External ear normal.     Left Ear: External ear normal.     Nose: Nose normal.  Eyes:     Extraocular Movements: Extraocular movements intact.     Conjunctiva/sclera: Conjunctivae normal.  Cardiovascular:  Rate and Rhythm: Normal rate and regular rhythm.  Pulmonary:     Effort: Pulmonary effort is normal. No respiratory distress.  Abdominal:     General: There is no distension.     Palpations: Abdomen is soft.  Genitourinary:    Comments: Fluctuant, tender swelling on right side of scrotum. Musculoskeletal:        General: No swelling. Normal range of motion.     Cervical back: Normal range of motion and neck supple.  Skin:    General: Skin is warm and dry.     Coloration: Skin is not jaundiced or pale.  Neurological:     General: No focal deficit present.     Mental Status: He is alert and oriented to person, place, and time.  Psychiatric:        Mood and Affect: Mood normal.        Behavior: Behavior normal.     ED Results /  Procedures / Treatments   Labs (all labs ordered are listed, but only abnormal results are displayed) Labs Reviewed  CBC WITH DIFFERENTIAL/PLATELET - Abnormal; Notable for the following components:      Result Value   WBC 17.6 (*)    RBC 3.67 (*)    Hemoglobin 11.0 (*)    HCT 34.0 (*)    RDW 15.9 (*)    Neutro Abs 13.8 (*)    Monocytes Absolute 1.1 (*)    Abs Immature Granulocytes 0.08 (*)    All other components within normal limits  URINALYSIS, ROUTINE W REFLEX MICROSCOPIC - Abnormal; Notable for the following components:   Color, Urine STRAW (*)    Glucose, UA 150 (*)    Hgb urine dipstick SMALL (*)    Protein, ur 100 (*)    All other components within normal limits  BASIC METABOLIC PANEL - Abnormal; Notable for the following components:   CO2 19 (*)    Glucose, Bld 106 (*)    BUN 83 (*)    Creatinine, Ser 18.94 (*)    GFR, Estimated 3 (*)    Anion gap 16 (*)    All other components within normal limits    EKG EKG Interpretation Date/Time:  Tuesday December 06 2023 00:59:29 EDT Ventricular Rate:  89 PR Interval:  155 QRS Duration:  99 QT Interval:  354 QTC Calculation: 431 R Axis:   21  Text Interpretation: Sinus rhythm Repol abnrm suggests ischemia, lateral leads Confirmed by Gloris Manchester [id number]) on 12/06/2023 3:48:23 AM  Radiology CT PELVIS WO CONTRAST Result Date: 12/06/2023 CLINICAL DATA:  Scrotal abscess and edema. EXAM: CT PELVIS WITHOUT CONTRAST TECHNIQUE: Multidetector CT imaging of the pelvis was performed following the standard protocol without intravenous contrast. RADIATION DOSE REDUCTION: This exam was performed according to the departmental dose-optimization program which includes automated exposure control, adjustment of the mA and/or kV according to patient size and/or use of iterative reconstruction technique. COMPARISON:  CT pelvis 05/17/2023 FINDINGS: Urinary Tract:  No abnormality visualized. Bowel:  Unremarkable visualized pelvic bowel loops.  Vascular/Lymphatic: No pathologically enlarged lymph nodes. No significant vascular abnormality seen. Reproductive: Stranding and edema in the right scrotum. No drainable abscess. The right testicle is mildly heterogenous suspicious for orchitis. No soft tissue gas. Other:  No free intraperitoneal fluid or air. Musculoskeletal: No acute fracture. IMPRESSION: 1. Stranding and edema in the right scrotum are nonspecific but compatible with cellulitis. No drainable abscess. No soft tissue gas. 2. The right testicle is mildly heterogenous suspicious for orchitis. Electronically Signed  By: Minerva Fester M.D.   On: 12/06/2023 02:07    Procedures Procedures    Medications Ordered in ED Medications  atorvastatin (LIPITOR) tablet 20 mg (has no administration in time range)  carvedilol (COREG) tablet 6.25 mg (has no administration in time range)  hydrALAZINE (APRESOLINE) tablet 25 mg (has no administration in time range)  isosorbide mononitrate (IMDUR) 24 hr tablet 30 mg (has no administration in time range)  sacubitril-valsartan (ENTRESTO) 24-26 mg per tablet (has no administration in time range)  acetaminophen (TYLENOL) tablet 650 mg (has no administration in time range)    Or  acetaminophen (TYLENOL) suppository 650 mg (has no administration in time range)  traZODone (DESYREL) tablet 25 mg (has no administration in time range)  magnesium hydroxide (MILK OF MAGNESIA) suspension 30 mL (has no administration in time range)  metoCLOPramide (REGLAN) injection 10 mg (has no administration in time range)  morphine (PF) 2 MG/ML injection 2 mg (2 mg Intravenous Given 12/06/23 0820)  linezolid (ZYVOX) IVPB 600 mg (has no administration in time range)  heparin injection 5,000 Units (has no administration in time range)  piperacillin-tazobactam (ZOSYN) IVPB 3.375 g (has no administration in time range)  HYDROmorphone (DILAUDID) injection 1 mg (1 mg Intravenous Given 12/06/23 0117)  ondansetron (ZOFRAN)  injection 4 mg (4 mg Intravenous Given 12/06/23 0117)  piperacillin-tazobactam (ZOSYN) IVPB 3.375 g (0 g Intravenous Stopped 12/06/23 0419)  vancomycin (VANCOREADY) IVPB 2000 mg/400 mL (0 mg Intravenous Stopped 12/06/23 0615)    ED Course/ Medical Decision Making/ A&P                                 Medical Decision Making Amount and/or Complexity of Data Reviewed Labs: ordered. Radiology: ordered.  Risk Prescription drug management. Decision regarding hospitalization.   This patient presents to the ED for concern of scrotal abscess, this involves an extensive number of treatment options, and is a complaint that carries with it a high risk of complications and morbidity.  The differential diagnosis includes abscess, cellulitis, Fournier's gangrene, orchitis, hydrocele   Co morbidities that complicate the patient evaluation  ESRD, CVA, prediabetes, HTN, GERD, gout, CHF, HLD   Additional history obtained:  Additional history obtained from N/A External records from outside source obtained and reviewed including EMR   Lab Tests:  I Ordered, and personally interpreted labs.  The pertinent results include: Leukocytosis is present.  Anemia is improved from baseline.  Creatinine and BUN are elevated consistent with ESRD.  Acidosis appears slightly worse than baseline.   Imaging Studies ordered:  I ordered imaging studies including CT pelvis I independently visualized and interpreted imaging which showed no drainable fluid collections identified, stranding and edema present consistent with cellulitis, right testicle heterogeneous concerning for orchitis I agree with the radiologist interpretation   Cardiac Monitoring: / EKG:  The patient was maintained on a cardiac monitor.  I personally viewed and interpreted the cardiac monitored which showed an underlying rhythm of: Sinus rhythm   Consultations Obtained:  I requested consultation with the urologist, Dr. Jennette Bill,  and  discussed lab and imaging findings as well as pertinent plan - they recommend: Admission to [facility]   Problem List / ED Course / Critical interventions / Medication management  Patient presenting for recurrence of scrotal abscess.  It was first noticed 4 days ago and swelling and pain have increased since that time.  Vital signs on arrival are normal.  Notably, patient  is afebrile.  He denies any recent subjective fevers or chills.  Exam was performed with nurse chaperone present.  Patient has a large fluctuant area on the right side of the scrotum.  He has also missed recent dialysis.  Lab work was ordered.  Lab work notable for leukocytosis.  Imaging showed findings consistent with cellulitis and possibly right-sided orchitis.  No drainable fluid collections were identified.  Urology was consulted.  I spoke with urologist on-call, Dr. Jennette Bill, who request hospital admission in [place].  Given his ESRD, plan will be to admit to [facility].  Vancomycin and Zosyn were ordered.  Patient had improved pain following single dose of Dilaudid.  He was admitted for further management. I ordered medication including Dilaudid for analgesia; Zofran for nausea; vancomycin and Zosyn for cellulitis Reevaluation of the patient after these medicines showed that the patient improved I have reviewed the patients home medicines and have made adjustments as needed   Social Determinants of Health:  Lives independently        Final Clinical Impression(s) / ED Diagnoses Final diagnoses:  Cellulitis of groin    Rx / DC Orders ED Discharge Orders     None         Gloris Manchester, MD 12/06/23 [phone]

## 2023-12-06 NOTE — Assessment & Plan Note (Addendum)
-   The patient will need a nephrology consult at [facility] for follow-up on hemodialysis. - She receives his hemodialysis on Tuesdays, Thursdays and Saturdays.

## 2023-12-06 NOTE — Progress Notes (Signed)
 PROGRESS NOTE    Patient: Zachary Lynch                            PCP: Pcp, No                    DOB: [birth year]            DOA: 12/05/2023 FAO:[id number]             DOS: 12/06/2023, 10:41 AM   LOS: 0 days   Date of Service: The patient was seen and examined on 12/06/2023  Subjective:   The patient was seen and examined this morning. Hemodynamically stable; afebrile, normotensive, satting 99% room air WBC 17.6,  No issues overnight .  Brief Narrative:   Zachary Lynch is a 38 y.o. 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  He admitted to fever and chills. He denies any drainage.   ED Course: BP 157/124 with otherwise normal vital signs.  Labs revealed CO2 19 and blood glucose of 106 with a BUN of 83 and creatinine at 10.94 and anion gap of 16.  CBC showed leukocytosis 17.6 with neutrophilia as well as anemia.  UA was remarkable for 150 glucose. EKG: EKG showed normal sinus rhythm with a rate of 89 with pulmonary progression Imaging: Pelvic CT without contrast revealed the following 1. Stranding and edema in the right scrotum are nonspecific but compatible with cellulitis. No drainable abscess. No soft tissue gas. 2. The right testicle is mildly heterogenous suspicious for orchitis. The patient was given 1 mg of IV Dilaudid, 4 mg of IV Zofran, IV vancomycin and Zosyn.  Contact was made with Dr. Jennette Bill who recommended admission to [facility] and his urology team will evaluate the patient there.  He will be admitted to a medical telemetry bed at [facility] for further evaluation and management.     Assessment & Plan:   Principal Problem:   Scrotal abscess Active Problems:   End-stage renal disease on hemodialysis  (HCC)   Essential hypertension   Dyslipidemia   GERD without esophagitis   Gout   Testicular abscess Assessment and Plan:  * Scrotal abscess -Scrotal cellulitis/abscess - Pending urology evaluation possible I&D, will follow-up with fluid cultures obtained -Continue current antibiotics, was initiated on vancomycin and Zosyn, Vanco was substituted with Zyvox   - Pain management will be provided. - We will continue him on IV vancomycin and Zosyn. - Urology consult will be obtained. - Dr. Jennette Bill was notified about the patient and is aware.  His team will evaluate the patient today.   -Called and discussed with urologist Dr. Dennard Nip Bell-reviewed the images and will see if urology can see the patient either at [facility] or [facility] This point plan is to I&D the patient and ED, likely will stay at [facility]   End-stage renal disease on hemodialysis [facility]) - The patient will need a nephrology consult at [facility] for follow-up on hemodialysis. - She receives his hemodialysis on Tuesdays, Thursdays and Saturdays. Discussed with nephrologist, patient will proceed with hemodialysis today Dr. Arrie Aran  Essential hypertension - Continue antihypertensive therapy.   Dyslipidemia - We will continue statin therapy.   Gout - We  will continue allopurinol.   GERD without esophagitis - We will continue PPI and H2 blocker therapy.    ----------------------------------------------------------------------------------------------------------------------------------------------- Nutritional status:  The patient's BMI is: There is no height or weight on file to calculate BMI. I agree with the assessment and plan as outlined   ------------------------------------------------------------------------------------------------------------------------------------------------  DVT prophylaxis:  heparin injection 5,000 Units Start: 12/06/23 2200   Code Status:   Code Status: Full Code  Family Communication: No family  member present at bedside--Advance care planning has been discussed.   Admission status:   Status is: Inpatient Remains inpatient appropriate because: Needing urology consult, IV antibiotics, hemodialysis   Disposition: From  - home             Planning for discharge in 1-2 days: to   Procedures:   No admission procedures for hospital encounter.   Antimicrobials:  Anti-infectives (From admission, onward)    Start     Dose/Rate Route Frequency Ordered Stop   12/06/23 1200  piperacillin-tazobactam (ZOSYN) IVPB 3.375 g        3.375 g 12.5 mL/hr over 240 Minutes Intravenous Every 12 hours 12/06/23 0821 12/16/23 0959   12/06/23 1000  linezolid (ZYVOX) IVPB 600 mg        600 mg 300 mL/hr over 60 Minutes Intravenous Every 12 hours 12/06/23 0808 12/16/23 0959   12/06/23 0815  piperacillin-tazobactam (ZOSYN) IVPB 3.375 g  Status:  Discontinued       Placed in "And" Linked Group   3.375 g 100 mL/hr over 30 Minutes Intravenous  Once 12/06/23 0808 12/06/23 0821   12/06/23 0715  vancomycin (VANCOCIN) IVPB 1000 mg/200 mL premix  Status:  Discontinued        1,000 mg 200 mL/hr over 60 Minutes Intravenous  Once 12/06/23 0704 12/06/23 0803   12/06/23 0330  piperacillin-tazobactam (ZOSYN) IVPB 3.375 g        3.375 g 100 mL/hr over 30 Minutes Intravenous  Once 12/06/23 0329 12/06/23 0419   12/06/23 0330  vancomycin (VANCOREADY) IVPB 2000 mg/400 mL        2,000 mg 200 mL/hr over 120 Minutes Intravenous  Once 12/06/23 0329 12/06/23 0615        Medication:   atorvastatin  20 mg Oral Daily   carvedilol  6.25 mg Oral BID WC   Chlorhexidine Gluconate Cloth  6 each Topical Q0600   heparin injection (subcutaneous)  5,000 Units Subcutaneous Q8H   hydrALAZINE  25 mg Oral TID   isosorbide mononitrate  30 mg Oral Daily   sacubitril-valsartan  1 tablet Oral BID    acetaminophen **OR** acetaminophen, HYDROmorphone (DILAUDID) injection, magnesium hydroxide, metoCLOPramide (REGLAN) injection,  traZODone   Objective:   Vitals:   12/06/23 0342 12/06/23 0400 12/06/23 0600 12/06/23 0809  BP: 122/89 (!) 135/91 123/85 (!) 138/99  Pulse: 91 89 77 82  Resp: 16 17 19 20   Temp: 98.4 F (36.9 C)   97.9 F (36.6 C)  TempSrc: Oral   Oral  SpO2: 98% 98% 98% 99%    Intake/Output Summary (Last 24 hours) at 12/06/2023 1041 Last data filed at 12/06/2023 3244 Gross per 24 hour  Intake --  Output 525 ml  Net -525 ml   There were no vitals filed for this visit.   Physical examination:   Constitution:  Alert, cooperative, no distress,  Appears calm and comfortable  Psychiatric:   Normal and stable mood and affect, cognition intact,   HEENT:        Normocephalic, PERRL, otherwise with  in Normal limits  Chest:         Chest symmetric Cardio vascular:  S1/S2, RRR, No murmure, No Rubs or Gallops  pulmonary: Clear to auscultation bilaterally, respirations unlabored, negative wheezes / crackles Abdomen: Soft, non-tender, non-distended, bowel sounds,no masses, no organomegaly Muscular skeletal: Limited exam - in bed, able to move all 4 extremities,   Neuro: CNII-XII intact. , normal motor and sensation, reflexes intact  Extremities: No pitting edema lower extremities, +2 pulses  Skin: Dry, warm to touch, negative for any Rashes, visible enlarged erythematic tender lesion in the testicular area, -no opening or drainage   ------------------------------------------------------------------------------------------------------------------------------------------    LABs:     Latest Ref Rng & Units 12/06/2023    1:18 AM 05/21/2023    2:46 AM 05/20/2023    6:59 AM  CBC  WBC 4.0 - 10.5 K/uL 17.6  13.1  14.0   Hemoglobin 13.0 - 17.0 g/dL 16.1  9.1  8.3   Hematocrit 39.0 - 52.0 % 34.0  27.8  24.7   Platelets 150 - 400 K/uL 193  195  192       Latest Ref Rng & Units 12/06/2023    1:18 AM 05/21/2023    2:46 AM 05/20/2023    2:29 AM  CMP  Glucose 70 - 99 mg/dL 096  045  409   BUN 6 - 20 mg/dL  83  49  83   Creatinine 0.61 - 1.24 mg/dL 81.19  14.78  29.56   Sodium 135 - 145 mmol/L 138  138  137   Potassium 3.5 - 5.1 mmol/L 5.0  3.4  3.3   Chloride 98 - 111 mmol/L 103  99  101   CO2 22 - 32 mmol/L 19  25  21    Calcium 8.9 - 10.3 mg/dL 8.9  8.3  8.3        Micro Results No results found for this or any previous visit (from the past 240 hours).  Radiology Reports CT PELVIS WO CONTRAST Result Date: 12/06/2023 CLINICAL DATA:  Scrotal abscess and edema. EXAM: CT PELVIS WITHOUT CONTRAST TECHNIQUE: Multidetector CT imaging of the pelvis was performed following the standard protocol without intravenous contrast. RADIATION DOSE REDUCTION: This exam was performed according to the departmental dose-optimization program which includes automated exposure control, adjustment of the mA and/or kV according to patient size and/or use of iterative reconstruction technique. COMPARISON:  CT pelvis 05/17/2023 FINDINGS: Urinary Tract:  No abnormality visualized. Bowel:  Unremarkable visualized pelvic bowel loops. Vascular/Lymphatic: No pathologically enlarged lymph nodes. No significant vascular abnormality seen. Reproductive: Stranding and edema in the right scrotum. No drainable abscess. The right testicle is mildly heterogenous suspicious for orchitis. No soft tissue gas. Other:  No free intraperitoneal fluid or air. Musculoskeletal: No acute fracture. IMPRESSION: 1. Stranding and edema in the right scrotum are nonspecific but compatible with cellulitis. No drainable abscess. No soft tissue gas. 2. The right testicle is mildly heterogenous suspicious for orchitis. Electronically Signed   By: Minerva Fester M.D.   On: 12/06/2023 02:07    SIGNED: Kendell Bane, MD, FHM. FAAFP. [facility] - [facility] Time spent - 55 min.  In seeing, evaluating and examining the patient. Reviewing medical records, labs, drawn plan of care. [facility],  Pager (please use [email or web address] to page/ text) Please  use Epic Secure Chat for non-urgent communication (7AM-7PM)  If 7PM-7AM, please contact night-coverage [email or web address], 12/06/2023, 10:41 AM

## 2023-12-07 DIAGNOSIS — N492 Inflammatory disorders of scrotum: Secondary | ICD-10-CM | POA: Diagnosis not present

## 2023-12-07 LAB — BASIC METABOLIC PANEL
Anion gap: 11 (ref 5–15)
BUN: 42 mg/dL — ABNORMAL HIGH (ref 6–20)
CO2: 26 mmol/L (ref 22–32)
Calcium: 8.6 mg/dL — ABNORMAL LOW (ref 8.9–10.3)
Chloride: 100 mmol/L (ref 98–111)
Creatinine, Ser: 11.1 mg/dL — ABNORMAL HIGH (ref 0.61–1.24)
GFR, Estimated: 5 mL/min — ABNORMAL LOW (ref >60)
Glucose, Bld: 76 mg/dL (ref 70–99)
Potassium: 4.3 mmol/L (ref 3.5–5.1)
Sodium: 137 mmol/L (ref 135–145)

## 2023-12-07 LAB — CBC
HCT: 31.4 % — ABNORMAL LOW (ref 39.0–52.0)
Hemoglobin: 10.2 g/dL — ABNORMAL LOW (ref 13.0–17.0)
MCH: 29.7 pg (ref 26.0–34.0)
MCHC: 32.5 g/dL (ref 30.0–36.0)
MCV: 91.3 fL (ref 80.0–100.0)
Platelets: 198 10*3/uL (ref 150–400)
RBC: 3.44 MIL/uL — ABNORMAL LOW (ref 4.22–5.81)
RDW: 15.6 % — ABNORMAL HIGH (ref 11.5–15.5)
WBC: 9.9 10*3/uL (ref 4.0–10.5)
nRBC: 0 % (ref 0.0–0.2)

## 2023-12-07 LAB — HEPATITIS B SURFACE ANTIGEN: Hepatitis B Surface Ag: NONREACTIVE

## 2023-12-07 LAB — PROTIME-INR
INR: 1.1 (ref 0.8–1.2)
Prothrombin Time: 13.9 s (ref 11.4–15.2)

## 2023-12-07 LAB — CORTISOL-AM, BLOOD: Cortisol - AM: 9.4 ug/dL (ref 6.7–22.6)

## 2023-12-07 MED ORDER — OXYCODONE-ACETAMINOPHEN 7.5-325 MG PO TABS: 1.0000 | ORAL_TABLET | ORAL | 0 refills | Status: AC | PRN

## 2023-12-07 MED ORDER — VANCOMYCIN HCL IN DEXTROSE 1-5 GM/200ML-% IV SOLN: 1000.0000 mg | Freq: Once | INTRAVENOUS | Status: DC

## 2023-12-07 MED ORDER — SODIUM CHLORIDE 0.9 % IV SOLN
1.0000 g | Freq: Once | INTRAVENOUS | Status: DC
  Filled 2023-12-07: qty 1

## 2023-12-07 MED ORDER — SODIUM CHLORIDE 0.9 % IV SOLN: 1.0000 g | INTRAVENOUS | Status: DC

## 2023-12-07 MED ORDER — METRONIDAZOLE 500 MG PO TABS: 500.0000 mg | ORAL_TABLET | Freq: Two times a day (BID) | ORAL | 0 refills | Status: AC

## 2023-12-07 MED ORDER — LACTINEX PO CHEW: 1.0000 | CHEWABLE_TABLET | Freq: Three times a day (TID) | ORAL | 0 refills | Status: AC

## 2023-12-07 MED ORDER — VANCOMYCIN HCL IN DEXTROSE 1-5 GM/200ML-% IV SOLN: 1000.0000 mg | INTRAVENOUS | Status: DC

## 2023-12-07 MED ORDER — METRONIDAZOLE 500 MG PO TABS
500.0000 mg | ORAL_TABLET | Freq: Two times a day (BID) | ORAL | Status: DC
  Administered 2023-12-07: 500 mg via ORAL
  Filled 2023-12-07: qty 1

## 2023-12-07 NOTE — Plan of Care (Signed)
  Problem: Fluid Volume: Goal: Hemodynamic stability will improve Outcome: Progressing   Problem: Clinical Measurements: Goal: Diagnostic test results will improve Outcome: Progressing Goal: Signs and symptoms of infection will decrease Outcome: Progressing   Problem: Respiratory: Goal: Ability to maintain adequate ventilation will improve Outcome: Progressing   Problem: Education: Goal: Knowledge of General Education information will improve Description: Including pain rating scale, medication(s)/side effects and non-pharmacologic comfort measures Outcome: Progressing   Problem: Health Behavior/Discharge Planning: Goal: Ability to manage health-related needs will improve Outcome: Progressing   Problem: Clinical Measurements: Goal: Ability to maintain clinical measurements within normal limits will improve Outcome: Progressing Goal: Will remain free from infection Outcome: Progressing Goal: Diagnostic test results will improve Outcome: Progressing Goal: Respiratory complications will improve Outcome: Progressing Goal: Cardiovascular complication will be avoided Outcome: Progressing   Problem: Activity: Goal: Risk for activity intolerance will decrease Outcome: Progressing   Problem: Nutrition: Goal: Adequate nutrition will be maintained Outcome: Progressing   Problem: Coping: Goal: Level of anxiety will decrease Outcome: Progressing   Problem: Elimination: Goal: Will not experience complications related to bowel motility Outcome: Progressing Goal: Will not experience complications related to urinary retention Outcome: Progressing   Problem: Pain Managment: Goal: General experience of comfort will improve and/or be controlled Outcome: Progressing   Problem: Safety: Goal: Ability to remain free from injury will improve Outcome: Progressing   Problem: Skin Integrity: Goal: Risk for impaired skin integrity will decrease Outcome: Progressing   Problem:  Clinical Measurements: Goal: Ability to avoid or minimize complications of infection will improve Outcome: Progressing   Problem: Skin Integrity: Goal: Skin integrity will improve Outcome: Progressing

## 2023-12-07 NOTE — Discharge Summary (Signed)
 Physician Discharge Summary   Patient: Zachary Lynch MRN: [id number] DOB: 1986/02/05  Admit date:     12/05/2023  Discharge date: 12/07/23  Discharge Physician: Kendell Bane   PCP: Pcp, No   Recommendations at discharge:   Follow-up with PCP in 1 week Follow-up with infectious disease, neurologist in 2-4 weeks Continue wound care per instructions: Keep the areas clean as possible, with dressing changes Continue antibiotics per ID recommendations (vancomycin/ceftazidime with hemodialysis TTS, oral Flagyl twice a day Monitor labs, CBC BMP,  c/s, and vanco levels as indicated Follow-up with wound cultures  Discharge Diagnoses: Principal Problem:   Scrotal abscess Active Problems:   End-stage renal disease on hemodialysis (HCC)   Essential hypertension   Dyslipidemia   GERD without esophagitis   Gout   Testicular abscess  Resolved Problems:   * No resolved hospital problems. *  Hospital Course: Zachary Lynch is a 38 y.o. 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  He admitted to fever and chills. He denies any drainage.   ED Course: BP 157/124 with otherwise normal vital signs.  Labs revealed CO2 19 and blood glucose of 106 with a BUN of 83 and creatinine at 10.94 and anion gap of 16.  CBC showed leukocytosis 17.6 with neutrophilia as well as anemia.  UA was remarkable for 150 glucose. EKG: EKG showed normal sinus rhythm with a rate of 89 with pulmonary progression Imaging: Pelvic CT without contrast revealed the following 1. Stranding and edema in the right scrotum are nonspecific but compatible with cellulitis. No drainable abscess. No soft tissue gas. 2. The right testicle is mildly heterogenous  suspicious for orchitis. The patient was given 1 mg of IV Dilaudid, 4 mg of IV Zofran, IV vancomycin and Zosyn.  Contact was made with Dr. Jennette Bill who recommended admission to [facility] and his urology team will evaluate the patient there.  He will be admitted to a medical telemetry bed at [facility] for further evaluation and management.  * Scrotal abscess - Scrotal cellulitis/abscess -S/p I&D 12/06/2023 in the ED by ED PA -per urology recommendation -Pending cultures -Urology following -Continue current antibiotics of vancomycin and Zyvox--discontinued 12/07/2023 -ID consulted; Dr. Luciana Axe recommended vancomycin/ceftazidime with hemodialysis and oral Flagyl   Vancomycin 1000 mg IV every TThSat w/ HD Ceftazidime 1000 mg IV every TThSat w/ HD.   Vanco  3/18   restarted 3/19 >> Ceftazidime 3/19 >> Flagyl 3/19 >>  Pain medication provided    End-stage renal disease on hemodialysis [facility]) - S/p hemodialysis on Tuesday, 12/06/2023 - She receives his hemodialysis on Tuesdays, Thursdays and Saturdays. Discussed with nephrologist Dr. Arrie Aran  Essential hypertension - Continue antihypertensive therapy. Stable   Dyslipidemia - Stable, continue statin    Gout - Stable, continue allopurinol.   GERD without esophagitis - Stable, continue PPI and H2 blocker therapy.   Consultants: Nephrology/urology/infectious disease Procedures performed: I&D in ED Disposition: Home Diet recommendation:  Discharge Diet Orders (From admission, onward)     Start     Ordered   12/07/23 0000  Diet - low sodium heart healthy        12/07/23 1425           Renal diet DISCHARGE MEDICATION: Allergies as of 12/07/2023  Reactions   Ultram [tramadol] Nausea Only   Disorientation    Ventolin [albuterol] Other (See Comments)   Reaction is unknown. Allergy was entered post Stroke "caused him to have a stroke"         Medication List     TAKE these medications    acetaminophen 325 MG  tablet Commonly known as: TYLENOL Take 2 tablets (650 mg total) by mouth every 4 (four) hours as needed for headache or mild pain.   Aspirin Low Dose 81 MG tablet Generic drug: aspirin EC Take 1 tablet (81 mg total) by mouth daily.   atorvastatin 20 MG tablet Commonly known as: LIPITOR Take 1 tablet (20 mg total) by mouth daily.   calcium acetate 667 MG capsule Commonly known as: PHOSLO Take 2,001 mg by mouth 3 (three) times daily with meals.   carvedilol 6.25 MG tablet Commonly known as: COREG Take 1 tablet (6.25 mg total) by mouth 2 (two) times daily.   cefTAZidime 1 g in sodium chloride 0.9 % 100 mL Inject 1 g into the vein Every Tuesday,Thursday,and Saturday with dialysis. Start taking on: December 08, 2023   famotidine 40 MG tablet Commonly known as: PEPCID Take 40 mg by mouth as needed for heartburn or indigestion.   furosemide 80 MG tablet Commonly known as: LASIX Take 80 mg by mouth daily. Take 1 tablet by mouth on Monday,Wednesday,Friday and Sunday.   hydrALAZINE 25 MG tablet Commonly known as: APRESOLINE Take 1 tablet (25 mg total) by mouth 3 (three) times daily. What changed: additional instructions   isosorbide mononitrate 30 MG 24 hr tablet Commonly known as: IMDUR Take 1 tablet (30 mg total) by mouth daily.   lactobacillus acidophilus & bulgar chewable tablet Chew 1 tablet by mouth 3 (three) times daily with meals for 20 days.   Lokelma 10 g Pack packet Generic drug: sodium zirconium cyclosilicate Take 10 g by mouth See admin instructions. Take on days Dialysis is missed.   metroNIDAZOLE 500 MG tablet Commonly known as: FLAGYL Take 1 tablet (500 mg total) by mouth every 12 (twelve) hours for 12 days.   oxyCODONE-acetaminophen 7.5-325 MG tablet Commonly known as: Percocet Take 1 tablet by mouth every 4 (four) hours as needed for up to 3 days for severe pain (pain score 7-10).   pantoprazole 40 MG tablet Commonly known as: PROTONIX Take 40 mg by  mouth daily.   predniSONE 20 MG tablet Commonly known as: DELTASONE Take 2 tablets (40 mg total) by mouth daily for up to 5 days during gout flares   sacubitril-valsartan 24-26 MG Commonly known as: ENTRESTO Take 1 tablet by mouth 2 (two) times daily.   vancomycin 1-5 GM/200ML-% Soln Commonly known as: VANCOCIN Inject 200 mLs (1,000 mg total) into the vein Every Tuesday,Thursday,and Saturday with dialysis. Start taking on: December 08, 2023               Discharge Care Instructions  (From admission, onward)           Start     Ordered   12/07/23 0000  Discharge wound care:       Comments: Continue wound care per nursing instructions Keep the areas clean as possible, dry, with dressing changes   12/07/23 1425            Discharge Exam: Filed Weights   12/06/23 1344 12/06/23 1804 12/06/23 2225  Weight: 109.8 kg 110.5 kg 109 kg        General:  AAO x  3,  cooperative, no distress;   HEENT:  Normocephalic, PERRL, otherwise with in Normal limits   Neuro:  CNII-XII intact. , normal motor and sensation, reflexes intact   Lungs:   Clear to auscultation BL, Respirations unlabored,  No wheezes / crackles  Cardio:    S1/S2, RRR, No murmure, No Rubs or Gallops   Abdomen:  Soft, non-tender, bowel sounds active all four quadrants, no guarding or peritoneal signs.  Muscular  skeletal:  Limited exam -global generalized weaknesses - in bed, able to move all 4 extremities,   2+ pulses,  symmetric, No pitting edema  Skin:  Dry, warm to touch, negative for any Rashes, testicular lesion I&D draining dressing in place  Wounds: Testicular small open wound, with erythema mild edema          Condition at discharge: good  The results of significant diagnostics from this hospitalization (including imaging, microbiology, ancillary and laboratory) are listed below for reference.   Imaging Studies: CT PELVIS WO CONTRAST Result Date: 12/06/2023 CLINICAL DATA:  Scrotal  abscess and edema. EXAM: CT PELVIS WITHOUT CONTRAST TECHNIQUE: Multidetector CT imaging of the pelvis was performed following the standard protocol without intravenous contrast. RADIATION DOSE REDUCTION: This exam was performed according to the departmental dose-optimization program which includes automated exposure control, adjustment of the mA and/or kV according to patient size and/or use of iterative reconstruction technique. COMPARISON:  CT pelvis 05/17/2023 FINDINGS: Urinary Tract:  No abnormality visualized. Bowel:  Unremarkable visualized pelvic bowel loops. Vascular/Lymphatic: No pathologically enlarged lymph nodes. No significant vascular abnormality seen. Reproductive: Stranding and edema in the right scrotum. No drainable abscess. The right testicle is mildly heterogenous suspicious for orchitis. No soft tissue gas. Other:  No free intraperitoneal fluid or air. Musculoskeletal: No acute fracture. IMPRESSION: 1. Stranding and edema in the right scrotum are nonspecific but compatible with cellulitis. No drainable abscess. No soft tissue gas. 2. The right testicle is mildly heterogenous suspicious for orchitis. Electronically Signed   By: Minerva Fester M.D.   On: 12/06/2023 02:07    Microbiology: Results for orders placed or performed during the hospital encounter of 12/05/23  Aerobic Culture w Gram Stain (superficial specimen)     Status: None (Preliminary result)   Collection Time: 12/06/23 10:23 AM   Specimen: Scrotum; Abscess  Result Value Ref Range Status   Specimen Description   Final    SCROTUM Performed at [facility], [address] [place], [place] [address]    Special Requests   Final    NONE Performed at [facility], [address], [place], [place] [address]    Gram Stain   Final    NO WBC SEEN FEW GRAM POSITIVE COCCI IN PAIRS RARE GRAM POSITIVE RODS    Culture   Final    CULTURE REINCUBATED FOR BETTER GROWTH Performed at [facility], [address],  [place], [place] [address]    Report Status PENDING  Incomplete    Labs: CBC: Recent Labs  Lab 12/06/23 0118 12/07/23 0409  WBC 17.6* 9.9  NEUTROABS 13.8*  --   HGB 11.0* 10.2*  HCT 34.0* 31.4*  MCV 92.6 91.3  PLT 193 198   Basic Metabolic Panel: Recent Labs  Lab 12/06/23 0118 12/07/23 0409  NA 138 137  K 5.0 4.3  CL 103 100  CO2 19* 26  GLUCOSE 106* 76  BUN 83* 42*  CREATININE 18.94* 11.10*  CALCIUM 8.9 8.6*     Discharge time spent: greater than 40  minutes.  Signed: Kendell Bane, MD [facility] 12/07/2023

## 2023-12-07 NOTE — Progress Notes (Signed)
 Patient ID: Zachary Lynch, male   DOB: 09/28/1985, 38 y.o.   MRN: [id number] S: Feels much better and wants to go home O:BP 105/70 (BP Location: Right Arm)   Pulse 91   Temp 98.3 F (36.8 C)   Resp 17   Ht 6\' 1"  (1.854 m)   Wt 109 kg   SpO2 98%   BMI 31.70 kg/m   Intake/Output Summary (Last 24 hours) at 12/07/2023 1033 Last data filed at 12/07/2023 0555 Gross per 24 hour  Intake 943.1 ml  Output 1500 ml  Net -556.9 ml   Intake/Output: I/O last 3 completed shifts: In: 943.1 [P.O.:240; IV Piggyback:703.1] Out: 2025 [Urine:525; Other:1500]  Intake/Output this shift:  No intake/output data recorded. Weight change:  Gen: NAD CVS: RRR Resp:CTA Abd: +BS, soft, NT/ND Ext: no edema  Recent Labs  Lab 12/06/23 0118 12/07/23 0409  NA 138 137  K 5.0 4.3  CL 103 100  CO2 19* 26  GLUCOSE 106* 76  BUN 83* 42*  CREATININE 18.94* 11.10*  CALCIUM 8.9 8.6*   Liver Function Tests: No results for input(s): "AST", "ALT", "ALKPHOS", "BILITOT", "PROT", "ALBUMIN" in the last 168 hours. No results for input(s): "LIPASE", "AMYLASE" in the last 168 hours. No results for input(s): "AMMONIA" in the last 168 hours. CBC: Recent Labs  Lab 12/06/23 0118 12/07/23 0409  WBC 17.6* 9.9  NEUTROABS 13.8*  --   HGB 11.0* 10.2*  HCT 34.0* 31.4*  MCV 92.6 91.3  PLT 193 198   Cardiac Enzymes: No results for input(s): "CKTOTAL", "CKMB", "CKMBINDEX", "TROPONINI" in the last 168 hours. CBG: No results for input(s): "GLUCAP" in the last 168 hours.  Iron Studies: No results for input(s): "IRON", "TIBC", "TRANSFERRIN", "FERRITIN" in the last 72 hours. Studies/Results: CT PELVIS WO CONTRAST Result Date: 12/06/2023 CLINICAL DATA:  Scrotal abscess and edema. EXAM: CT PELVIS WITHOUT CONTRAST TECHNIQUE: Multidetector CT imaging of the pelvis was performed following the standard protocol without intravenous contrast. RADIATION DOSE REDUCTION: This exam was performed according to the departmental  dose-optimization program which includes automated exposure control, adjustment of the mA and/or kV according to patient size and/or use of iterative reconstruction technique. COMPARISON:  CT pelvis 05/17/2023 FINDINGS: Urinary Tract:  No abnormality visualized. Bowel:  Unremarkable visualized pelvic bowel loops. Vascular/Lymphatic: No pathologically enlarged lymph nodes. No significant vascular abnormality seen. Reproductive: Stranding and edema in the right scrotum. No drainable abscess. The right testicle is mildly heterogenous suspicious for orchitis. No soft tissue gas. Other:  No free intraperitoneal fluid or air. Musculoskeletal: No acute fracture. IMPRESSION: 1. Stranding and edema in the right scrotum are nonspecific but compatible with cellulitis. No drainable abscess. No soft tissue gas. 2. The right testicle is mildly heterogenous suspicious for orchitis. Electronically Signed   By: Minerva Fester M.D.   On: 12/06/2023 02:07    atorvastatin  20 mg Oral Daily   carvedilol  6.25 mg Oral BID WC   Chlorhexidine Gluconate Cloth  6 each Topical Q0600   heparin injection (subcutaneous)  5,000 Units Subcutaneous Q8H   hydrALAZINE  25 mg Oral TID   isosorbide mononitrate  30 mg Oral Daily   sacubitril-valsartan  1 tablet Oral BID    BMET    Component Value Date/Time   NA 137 12/07/2023 0409   NA 141 11/15/2018 0000   K 4.3 12/07/2023 0409   K 4.0 11/15/2018 0000   CL 100 12/07/2023 0409   CL 107 01/30/2018 0000   CO2 26 12/07/2023 0409  CO2 19 01/30/2018 0000   GLUCOSE 76 12/07/2023 0409   BUN 42 (H) 12/07/2023 0409   BUN 30 11/15/2018 0000   CREATININE 11.10 (H) 12/07/2023 0409   CREATININE 16.54 (H) 01/14/2023 1419   CALCIUM 8.6 (L) 12/07/2023 0409   CALCIUM 9.0 01/30/2018 0000   GFRNONAA 5 (L) 12/07/2023 0409   GFRNONAA 9 (L) 03/07/2020 1047   GFRAA 10 (L) 03/07/2020 1047   CBC    Component Value Date/Time   WBC 9.9 12/07/2023 0409   RBC 3.44 (L) 12/07/2023 0409   HGB  10.2 (L) 12/07/2023 0409   HCT 31.4 (L) 12/07/2023 0409   PLT 198 12/07/2023 0409   MCV 91.3 12/07/2023 0409   MCH 29.7 12/07/2023 0409   MCHC 32.5 12/07/2023 0409   RDW 15.6 (H) 12/07/2023 0409   LYMPHSABS 2.5 12/06/2023 0118   MONOABS 1.1 (H) 12/06/2023 0118   EOSABS 0.1 12/06/2023 0118   BASOSABS 0.0 12/06/2023 0118    Dialysis Orders: Center:  [facility]  on TTS . EDW 109 kg HD Bath 2K/2.5Ca  Time 4:00 Heparin 1000 units bolus then 1000 units/hr. Access RIJ TDC BFR 400 DFR 600    Calcitriol 1.75 mcg po/HD  Micera 30 mcg IV q2 weeks   Assessment/Plan:  Scrotal abscess - recurrent issue.  S/p I&D by ED MD.  Antibiotics per primary svc.  Urology consultation appreciated.  Hopefully can continue with IV antibiotics as an outpatient with his HD unit.  ESRD -  Will plan for HD tomorrow to keep on outpatient schedule if he remains an inpatient.  Hypertension/volume  - stable  Anemia  - stable, no need for ESA at this time  Metabolic bone disease -   continue with his home medications  Nutrition - renal diet.   HFrEF - appears euvolemic  Irena Cords, MD [facility] [phone]

## 2023-12-07 NOTE — Progress Notes (Signed)
 PROGRESS NOTE    Patient: Zachary Lynch                            PCP: Pcp, No                    DOB: 06-05-1986            DOA: 12/05/2023 ZOX:[id number]             DOS: 12/07/2023, 10:13 AM   LOS: 1 day   Date of Service: The patient was seen and examined on 12/07/2023  Subjective:   The patient was seen and examined this morning. Hemodynamically stable. No issues overnight .  Brief Narrative:   Zachary Lynch is a 38 y.o. 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  He admitted to fever and chills. He denies any drainage.   ED Course: BP 157/124 with otherwise normal vital signs.  Labs revealed CO2 19 and blood glucose of 106 with a BUN of 83 and creatinine at 10.94 and anion gap of 16.  CBC showed leukocytosis 17.6 with neutrophilia as well as anemia.  UA was remarkable for 150 glucose. EKG: EKG showed normal sinus rhythm with a rate of 89 with pulmonary progression Imaging: Pelvic CT without contrast revealed the following 1. Stranding and edema in the right scrotum are nonspecific but compatible with cellulitis. No drainable abscess. No soft tissue gas. 2. The right testicle is mildly heterogenous suspicious for orchitis. The patient was given 1 mg of IV Dilaudid, 4 mg of IV Zofran, IV vancomycin and Zosyn.  Contact was made with Dr. Jennette Bill who recommended admission to [facility] and his urology team will evaluate the patient there.  He will be admitted to a medical telemetry bed at [facility] for further evaluation and management.     Assessment & Plan:   Principal Problem:   Scrotal abscess Active Problems:   End-stage renal disease on hemodialysis (HCC)   Essential hypertension   Dyslipidemia   GERD without  esophagitis   Gout   Testicular abscess   Assessment and Plan:  * Scrotal abscess -Scrotal cellulitis/abscess -S/p I&D 12/06/2023 in the ED by ED PA -per urology recommendation -Pending cultures -Urology following -Continue current antibiotics of vancomycin and Zyvox  - Pain management will be provided.  -ID consulted, appreciate input and final recommendation for antibiotics   End-stage renal disease on hemodialysis [facility]) - S/p hemodialysis on Tuesday, 12/06/2023 - She receives his hemodialysis on Tuesdays, Thursdays and Saturdays. Discussed with nephrologist Dr. Arrie Aran  Essential hypertension - Continue antihypertensive therapy. Stable   Dyslipidemia - Stable, continue statin    Gout - Stable, continue allopurinol.   GERD without esophagitis - Stable, continue PPI and H2 blocker therapy.    Assessment & Plan:   Principal Problem:   Scrotal abscess Active Problems:   End-stage renal disease on hemodialysis (HCC)   Essential hypertension   Dyslipidemia   GERD without esophagitis   Gout   Testicular abscess   Consults: Urology/nephrology/ID  ----------------------------------------------------------------------------------------------------------------------------------------------- Nutritional status:  The patient's BMI is: Body mass index is 31.7 kg/m. I agree with the assessment and plan as outlined ---------------------------------------------------------------------------------------------------------------------------------------------------- Cultures; Bloody fluid-pending cultures  ------------------------------------------------------------------------------------------------------------------------------------------------  DVT prophylaxis:  heparin injection 5,000 Units Start: 12/06/23 2200   Code Status:   Code Status: Full Code  Family Communication: No family member present at bedside- attempt will be made to update daily The above  findings and plan of care has been discussed with patient (and family)  in detail,  they expressed understanding and agreement of above. -Advance care planning has been discussed.   Admission status:   Status is: Inpatient Remains inpatient appropriate because: Needing IV antibiotics   Disposition: From  - home             Planning for discharge in 1-2 days: to   Procedures:   No admission procedures for hospital encounter.   Antimicrobials:  Anti-infectives (From admission, onward)    Start     Dose/Rate Route Frequency Ordered Stop   12/06/23 1200  piperacillin-tazobactam (ZOSYN) IVPB 3.375 g        3.375 g 12.5 mL/hr over 240 Minutes Intravenous Every 12 hours 12/06/23 0821 12/16/23 0959   12/06/23 1000  linezolid (ZYVOX) IVPB 600 mg        600 mg 300 mL/hr over 60 Minutes Intravenous Every 12 hours 12/06/23 0808 12/16/23 0959   12/06/23 0815  piperacillin-tazobactam (ZOSYN) IVPB 3.375 g  Status:  Discontinued       Placed in "And" Linked Group   3.375 g 100 mL/hr over 30 Minutes Intravenous  Once 12/06/23 0808 12/06/23 0821   12/06/23 0715  vancomycin (VANCOCIN) IVPB 1000 mg/200 mL premix  Status:  Discontinued        1,000 mg 200 mL/hr over 60 Minutes Intravenous  Once 12/06/23 0704 12/06/23 0803   12/06/23 0330  piperacillin-tazobactam (ZOSYN) IVPB 3.375 g        3.375 g 100 mL/hr over 30 Minutes Intravenous  Once 12/06/23 0329 12/06/23 0419   12/06/23 0330  vancomycin (VANCOREADY) IVPB 2000 mg/400 mL        2,000 mg 200 mL/hr over 120 Minutes Intravenous  Once 12/06/23 0329 12/06/23 0615        Medication:   atorvastatin  20 mg Oral Daily   carvedilol  6.25 mg Oral BID WC   Chlorhexidine Gluconate Cloth  6 each Topical Q0600   heparin injection (subcutaneous)  5,000 Units Subcutaneous Q8H   hydrALAZINE  25 mg Oral TID   isosorbide mononitrate  30 mg Oral Daily   sacubitril-valsartan  1 tablet Oral BID    acetaminophen **OR** acetaminophen, alteplase,  diphenhydrAMINE, feeding supplement (NEPRO CARB STEADY), heparin, heparin, HYDROmorphone (DILAUDID) injection, magnesium hydroxide, metoCLOPramide (REGLAN) injection, ondansetron, traZODone   Objective:   Vitals:   12/06/23 2215 12/06/23 2225 12/06/23 2237 12/07/23 0448  BP: 112/72 114/70 117/79 105/70  Pulse: 79 79 80 91  Resp: 15 18 16 17   Temp:  98.8 F (37.1 C) 98.9 F (37.2 C) 98.3 F (36.8 C)  TempSrc:  Oral Oral   SpO2:  100% 100% 98%  Weight:  109 kg    Height:        Intake/Output Summary (Last 24 hours) at 12/07/2023 1013 Last data filed at 12/07/2023 0555 Gross per 24 hour  Intake 943.1 ml  Output 1500 ml  Net -556.9 ml   Filed Weights   12/06/23 1344 12/06/23 1804 12/06/23 2225  Weight: 109.8 kg 110.5 kg 109 kg     Physical examination:   Constitution:  Alert, cooperative, no distress,  Appears calm and comfortable  Psychiatric:   Normal and stable mood and affect, cognition  intact,   HEENT:        Normocephalic, PERRL, otherwise with in Normal limits  Chest:         Chest symmetric Cardio vascular:  S1/S2, RRR, No murmure, No Rubs or Gallops  pulmonary: Clear to auscultation bilaterally, respirations unlabored, negative wheezes / crackles Abdomen: Soft, non-tender, non-distended, bowel sounds,no masses, no organomegaly Muscular skeletal: Limited exam - in bed, able to move all 4 extremities,   Neuro: CNII-XII intact. , normal motor and sensation, reflexes intact  Extremities: No pitting edema lower extremities, +2 pulses  Skin: Dry, warm to touch, negative for any Rashes, open testicular abscess, with packing, draining Wounds: Testicular abscess lesion   ------------------------------------------------------------------------------------------------------------------------------------------    LABs:     Latest Ref Rng & Units 12/07/2023    4:09 AM 12/06/2023    1:18 AM 05/21/2023    2:46 AM  CBC  WBC 4.0 - 10.5 K/uL 9.9  17.6  13.1   Hemoglobin 13.0  - 17.0 g/dL 78.2  95.6  9.1   Hematocrit 39.0 - 52.0 % 31.4  34.0  27.8   Platelets 150 - 400 K/uL 198  193  195       Latest Ref Rng & Units 12/07/2023    4:09 AM 12/06/2023    1:18 AM 05/21/2023    2:46 AM  CMP  Glucose 70 - 99 mg/dL 76  213  086   BUN 6 - 20 mg/dL 42  83  49   Creatinine 0.61 - 1.24 mg/dL 57.84  69.62  95.28   Sodium 135 - 145 mmol/L 137  138  138   Potassium 3.5 - 5.1 mmol/L 4.3  5.0  3.4   Chloride 98 - 111 mmol/L 100  103  99   CO2 22 - 32 mmol/L 26  19  25    Calcium 8.9 - 10.3 mg/dL 8.6  8.9  8.3        Micro Results Recent Results (from the past 240 hours)  Aerobic Culture w Gram Stain (superficial specimen)     Status: None (Preliminary result)   Collection Time: 12/06/23 10:23 AM   Specimen: Scrotum; Abscess  Result Value Ref Range Status   Specimen Description   Final    SCROTUM Performed at [facility], [address] [place], [place] [address]    Special Requests   Final    NONE Performed at [facility], [address], [place], [place] [address]    Gram Stain   Final    NO WBC SEEN FEW GRAM POSITIVE COCCI IN PAIRS RARE GRAM POSITIVE RODS    Culture   Final    CULTURE REINCUBATED FOR BETTER GROWTH Performed at [facility], [address], [place], [place] [address]    Report Status PENDING  Incomplete    Radiology Reports No results found.  SIGNED: Kendell Bane, MD, FHM. FAAFP. [facility] Gainer - [facility] hospitalist Time spent - 35 min.  In seeing, evaluating and examining the patient. Reviewing medical records, labs, drawn plan of care. [facility],  Pager (please use [email or web address] to page/ text) Please use Epic Secure Chat for non-urgent communication (7AM-7PM)  If 7PM-7AM, please contact night-coverage [email or web address], 12/07/2023, 10:13 AM

## 2023-12-07 NOTE — Consult Note (Signed)
 [facility]       Reason for Consult:abscess    Referring Physician: Dr. Flossie Dibble  Principal Problem:   Scrotal abscess Active Problems:   End-stage renal disease on hemodialysis [facility])   Essential hypertension   Dyslipidemia   GERD without esophagitis   Gout   Testicular abscess    atorvastatin  20 mg Oral Daily   carvedilol  6.25 mg Oral BID WC   Chlorhexidine Gluconate Cloth  6 each Topical Q0600   heparin injection (subcutaneous)  5,000 Units Subcutaneous Q8H   hydrALAZINE  25 mg Oral TID   isosorbide mononitrate  30 mg Oral Daily   sacubitril-valsartan  1 tablet Oral BID    Recommendations: Ceftazidime + vancomycin with dialysis  Metronidazole 500 mg po twice a day  Assessment: He has scrotal swelling  and abscess, recurrent now s/p drainage.  Will monitor cultures and treat based on previous results at [facility].    Evaluation of this patient requires complex antimicrobial therapy evaluation and counseling + isolation needs for disease transmission risk assessment and mitigation.    HPI: Zachary Lynch is a 38 y.o. male with ESRD with scrotal swelling and abscess s/p drainage.  Cultures noted and gram stain with GPC and GPRs.  WBC intially 17.6.  Afebrile. Previous cultures with Prevotella and Staph epidermidis when seen in November 2024 for scrotal abscess.  At that time had a large, fluctuant abscess.  Treated with doxycycline for a prolonged period.     Past Medical History:  Diagnosis Date   Arthritis    CKD stage 4 secondary to hypertension (HCC) 03/28/2017   Elevated blood uric acid level    Essential hypertension    Folliculitis    Managed with doxycycline, topical clindamycin/benzoyl peroxide   GERD (gastroesophageal reflux disease)    Gout    HFrEF (heart failure with reduced ejection fraction) (HCC)    Echo 2019 with EF 30-35%, hypokinesis   History of cardiomyopathy    LVEF 15% in 2010 - evaluated by [facility] and Dr. Graciela Husbands    History of stroke 2010   Right MCA distribution infarct, felt to be possibly embolic in the setting of cardiomyopathy - placed on Coumadin at that time   Hyperlipidemia    Nonischemic cardiomyopathy (HCC) 03/28/2017   Obesity    Prediabetes 05/03/2017    Social History   Tobacco Use   Smoking status: Never   Smokeless tobacco: Never  Vaping Use   Vaping status: Never Used  Substance Use Topics   Alcohol use: No    Alcohol/week: 0.0 standard drinks of alcohol   Drug use: No    Family History  Problem Relation Age of Onset   Stroke Cousin    Hypertension Mother    Heart attack Mother        Died age 76   Hypertension Father     Allergies  Allergen Reactions   Ultram [Tramadol] Nausea Only    Disorientation    Ventolin [Albuterol] Other (See Comments)    Reaction is unknown. Allergy was entered post Stroke "caused him to have a stroke"      Lab Results  Component Value Date   WBC 9.9 12/07/2023   HGB 10.2 (L) 12/07/2023   HCT 31.4 (L) 12/07/2023   MCV 91.3 12/07/2023   PLT 198 12/07/2023    Lab Results  Component Value Date   CREATININE 11.10 (H) 12/07/2023   BUN 42 (H) 12/07/2023   NA 137  12/07/2023   K 4.3 12/07/2023   CL 100 12/07/2023   CO2 26 12/07/2023    Lab Results  Component Value Date   ALT 15 05/17/2023   AST 23 05/17/2023   ALKPHOS 57 05/17/2023     Microbiology: Recent Results (from the past 240 hours)  Aerobic Culture w Gram Stain (superficial specimen)     Status: None (Preliminary result)   Collection Time: 12/06/23 10:23 AM   Specimen: Scrotum; Abscess  Result Value Ref Range Status   Specimen Description   Final    SCROTUM Performed at [facility], [address] [place], [place] [address]    Special Requests   Final    NONE Performed at [facility], [address], [place], [place] [address]    Gram Stain   Final    NO WBC SEEN FEW GRAM POSITIVE COCCI IN PAIRS RARE GRAM POSITIVE RODS    Culture   Final    CULTURE  REINCUBATED FOR BETTER GROWTH Performed at [facility], [address], [place], [place] [address]    Report Status PENDING  Incomplete    Gardiner Barefoot, MD [facility] [email or web address] 12/07/2023, 1:40 PM

## 2023-12-07 NOTE — Progress Notes (Signed)
 Pharmacy Antibiotic Note  Zachary Lynch is a 38 y.o. male admitted on 12/05/2023 with  abscess .  Pharmacy has been consulted for vancomycin and ceftazidime dosing.  Plan: Vancomycin 1000 mg IV every TThSat w/ HD Ceftazidime 1000 mg IV every TThSat w/ HD. Monitor labs, c/s, and vanco levels as indicated.  Height: 6\' 1"  (185.4 cm) Weight: 109 kg (240 lb 4.8 oz) IBW/kg (Calculated) : 79.9  Temp (24hrs), Avg:98.5 F (36.9 C), Min:97.8 F (36.6 C), Max:98.9 F (37.2 C)  Recent Labs  Lab 12/06/23 0118 12/07/23 0409  WBC 17.6* 9.9  CREATININE 18.94* 11.10*    Estimated Creatinine Clearance: 11.6 mL/min (A) (by C-G formula based on SCr of 11.1 mg/dL (H)).    Allergies  Allergen Reactions   Ultram [Tramadol] Nausea Only    Disorientation    Ventolin [Albuterol] Other (See Comments)    Reaction is unknown. Allergy was entered post Stroke "caused him to have a stroke"      Antimicrobials this admission: Vanco  3/18   restarted 3/19 >> Ceftazidime 3/19 >> Flagyl 3/19 >> Zosyn 3/18 >> 3/19   Microbiology results: 3/18 Abscess Cx: gram + cocci in pairs Gram + rods  Thank you for allowing pharmacy to be a part of this patient's care.  Judeth Cornfield, PharmD Clinical Pharmacist 12/07/2023 2:21 PM

## 2023-12-08 LAB — AEROBIC CULTURE W GRAM STAIN (SUPERFICIAL SPECIMEN): Gram Stain: NONE SEEN

## 2023-12-08 LAB — HEPATITIS B SURFACE ANTIBODY, QUANTITATIVE: Hep B S AB Quant (Post): 13817 m[IU]/mL

## 2023-12-22 ENCOUNTER — Other Ambulatory Visit (HOSPITAL_COMMUNITY): Payer: Self-pay

## 2023-12-22 ENCOUNTER — Other Ambulatory Visit: Payer: Self-pay

## 2023-12-23 ENCOUNTER — Other Ambulatory Visit (HOSPITAL_COMMUNITY): Payer: Self-pay

## 2024-02-06 ENCOUNTER — Other Ambulatory Visit (HOSPITAL_COMMUNITY): Payer: Self-pay

## 2024-02-23 DIAGNOSIS — Z992 Dependence on renal dialysis: Secondary | ICD-10-CM | POA: Diagnosis not present

## 2024-02-23 DIAGNOSIS — N186 End stage renal disease: Secondary | ICD-10-CM | POA: Diagnosis not present

## 2024-02-24 ENCOUNTER — Other Ambulatory Visit: Payer: Self-pay

## 2024-02-24 ENCOUNTER — Emergency Department (HOSPITAL_COMMUNITY)
Admission: EM | Admit: 2024-02-24 | Discharge: 2024-02-24 | Disposition: A | Discharge status: Home / Self Care | Attending: Emergency Medicine | Admitting: Emergency Medicine

## 2024-02-24 ENCOUNTER — Encounter (HOSPITAL_COMMUNITY): Payer: Self-pay

## 2024-02-24 DIAGNOSIS — R112 Nausea with vomiting, unspecified: Secondary | ICD-10-CM | POA: Diagnosis not present

## 2024-02-24 DIAGNOSIS — Z8673 Personal history of transient ischemic attack (TIA), and cerebral infarction without residual deficits: Secondary | ICD-10-CM | POA: Diagnosis not present

## 2024-02-24 DIAGNOSIS — E1122 Type 2 diabetes mellitus with diabetic chronic kidney disease: Secondary | ICD-10-CM | POA: Diagnosis not present

## 2024-02-24 DIAGNOSIS — Z7982 Long term (current) use of aspirin: Secondary | ICD-10-CM | POA: Insufficient documentation

## 2024-02-24 DIAGNOSIS — Z79899 Other long term (current) drug therapy: Secondary | ICD-10-CM | POA: Insufficient documentation

## 2024-02-24 DIAGNOSIS — I509 Heart failure, unspecified: Secondary | ICD-10-CM | POA: Diagnosis not present

## 2024-02-24 DIAGNOSIS — R079 Chest pain, unspecified: Secondary | ICD-10-CM | POA: Diagnosis not present

## 2024-02-24 DIAGNOSIS — K219 Gastro-esophageal reflux disease without esophagitis: Secondary | ICD-10-CM | POA: Insufficient documentation

## 2024-02-24 DIAGNOSIS — N186 End stage renal disease: Secondary | ICD-10-CM | POA: Diagnosis not present

## 2024-02-24 DIAGNOSIS — Z992 Dependence on renal dialysis: Secondary | ICD-10-CM | POA: Diagnosis not present

## 2024-02-24 DIAGNOSIS — I132 Hypertensive heart and chronic kidney disease with heart failure and with stage 5 chronic kidney disease, or end stage renal disease: Secondary | ICD-10-CM | POA: Diagnosis not present

## 2024-02-24 DIAGNOSIS — R0789 Other chest pain: Secondary | ICD-10-CM | POA: Diagnosis not present

## 2024-02-24 LAB — SEDIMENTATION RATE: Sed Rate: 21 mm/h — ABNORMAL HIGH (ref 0–15)

## 2024-02-24 LAB — COMPREHENSIVE METABOLIC PANEL WITH GFR
ALT: 11 U/L (ref 0–44)
AST: 19 U/L (ref 15–41)
Albumin: 3.6 g/dL (ref 3.5–5.0)
Alkaline Phosphatase: 122 U/L (ref 38–126)
Anion gap: 15 (ref 5–15)
BUN: 42 mg/dL — ABNORMAL HIGH (ref 6–20)
CO2: 24 mmol/L (ref 22–32)
Calcium: 9.1 mg/dL (ref 8.9–10.3)
Chloride: 101 mmol/L (ref 98–111)
Creatinine, Ser: 14.41 mg/dL — ABNORMAL HIGH (ref 0.61–1.24)
GFR, Estimated: 4 mL/min — ABNORMAL LOW
Glucose, Bld: 84 mg/dL (ref 70–99)
Potassium: 4 mmol/L (ref 3.5–5.1)
Sodium: 140 mmol/L (ref 135–145)
Total Bilirubin: 1 mg/dL (ref 0.0–1.2)
Total Protein: 7.2 g/dL (ref 6.5–8.1)

## 2024-02-24 LAB — URINALYSIS, ROUTINE W REFLEX MICROSCOPIC
Bacteria, UA: NONE SEEN
Bilirubin Urine: NEGATIVE
Glucose, UA: 50 mg/dL — AB
Ketones, ur: NEGATIVE mg/dL
Leukocytes,Ua: NEGATIVE
Nitrite: NEGATIVE
Protein, ur: 100 mg/dL — AB
Specific Gravity, Urine: 1.011 (ref 1.005–1.030)
pH: 8 (ref 5.0–8.0)

## 2024-02-24 LAB — CBC
HCT: 36.5 % — ABNORMAL LOW (ref 39.0–52.0)
Hemoglobin: 12 g/dL — ABNORMAL LOW (ref 13.0–17.0)
MCH: 30.7 pg (ref 26.0–34.0)
MCHC: 32.9 g/dL (ref 30.0–36.0)
MCV: 93.4 fL (ref 80.0–100.0)
Platelets: 217 K/uL (ref 150–400)
RBC: 3.91 MIL/uL — ABNORMAL LOW (ref 4.22–5.81)
RDW: 14.6 % (ref 11.5–15.5)
WBC: 8.6 K/uL (ref 4.0–10.5)
nRBC: 0 % (ref 0.0–0.2)

## 2024-02-24 LAB — TROPONIN I (HIGH SENSITIVITY)
Troponin I (High Sensitivity): 1680 ng/L (ref <18)
Troponin I (High Sensitivity): 1542 ng/L (ref <18)

## 2024-02-24 LAB — LIPASE, BLOOD: Lipase: 73 U/L — ABNORMAL HIGH (ref 11–51)

## 2024-02-24 MED ORDER — ALUM & MAG HYDROXIDE-SIMETH 200-200-20 MG/5ML PO SUSP
30.0000 mL | Freq: Once | ORAL | Status: AC
  Administered 2024-02-24: 30 mL via ORAL
  Filled 2024-02-24: qty 30

## 2024-02-24 MED ORDER — ONDANSETRON 4 MG PO TBDP
4.0000 mg | ORAL_TABLET | Freq: Three times a day (TID) | ORAL | 0 refills | Status: AC | PRN
Start: 2024-02-24 — End: ?

## 2024-02-24 MED ORDER — ASPIRIN 81 MG PO CHEW
324.0000 mg | CHEWABLE_TABLET | Freq: Once | ORAL | Status: AC
  Administered 2024-02-24: 324 mg via ORAL
  Filled 2024-02-24: qty 4

## 2024-02-24 MED ORDER — ISOSORBIDE MONONITRATE ER 30 MG PO TB24
30.0000 mg | ORAL_TABLET | Freq: Every day | ORAL | Status: DC
  Administered 2024-02-24: 30 mg via ORAL
  Filled 2024-02-24: qty 1

## 2024-02-24 MED ORDER — CARVEDILOL 3.125 MG PO TABS
6.2500 mg | ORAL_TABLET | Freq: Two times a day (BID) | ORAL | Status: DC
  Administered 2024-02-24: 6.25 mg via ORAL
  Filled 2024-02-24: qty 2

## 2024-02-24 MED ORDER — PANTOPRAZOLE SODIUM 40 MG PO TBEC
40.0000 mg | DELAYED_RELEASE_TABLET | Freq: Once | ORAL | Status: AC
  Administered 2024-02-24: 40 mg via ORAL
  Filled 2024-02-24: qty 1

## 2024-02-24 MED ORDER — PANTOPRAZOLE SODIUM 40 MG PO TBEC: 40.0000 mg | DELAYED_RELEASE_TABLET | Freq: Every day | ORAL | 0 refills | Status: AC

## 2024-02-24 MED ORDER — HYDRALAZINE HCL 20 MG/ML IJ SOLN
20.0000 mg | Freq: Once | INTRAMUSCULAR | Status: DC
  Filled 2024-02-24: qty 1

## 2024-02-24 MED ORDER — METOCLOPRAMIDE HCL 5 MG/ML IJ SOLN
10.0000 mg | Freq: Once | INTRAMUSCULAR | Status: AC
  Administered 2024-02-24: 10 mg via INTRAVENOUS
  Filled 2024-02-24: qty 2

## 2024-02-24 NOTE — Discharge Instructions (Signed)
 As discussed, your symptoms are most likely due to acid reflux and are being treated with protonix  and zofran .  However,  with your elevated troponins,  you should have close followup with your cardiologist.  Call for an appointment.  In the interim,  return here if you have any return or worsening symptoms.

## 2024-02-24 NOTE — ED Provider Notes (Signed)
 [place] EMERGENCY DEPARTMENT AT [facility] Provider Note   CSN: [id number] Arrival date & time: 02/24/24  1314     History  Chief Complaint  Patient presents with   Vomiting    Zachary Lynch is a 38 y.o. male with history including end-stage renal disease, Tuesday Thursday Saturday dialysis, GERD, history of CVA, hypertension, nonischemic cardiomyopathy with history of CHF, type 2 diabetes and hyperlipidemia presenting for evaluation of a 3-day history of nausea and vomiting.  He has a near constant burning sensation in his upper mid chest which radiates into his left shoulder which is relieved temporarily after nonbloody emesis.  He has been unable to tolerate any p.o. intake for the last 24 hours secondary to his symptoms.  He endorses having diarrhea about 2 weeks ago, states this symptom had completely resolved after several days.  He denies fevers or chills.  He denies abdominal pain, dysuria, but does endorse making more urine than his normal baseline.  He completed a full dialysis treatment yesterday.  He takes famotidine  daily for his GERD but has not had any symptom relief.  He denies shortness of breath, orthopnea, cough.  HPI     Home Medications Prior to Admission medications   Medication Sig Start Date End Date Taking? Authorizing Provider  ondansetron  (ZOFRAN -ODT) 4 MG disintegrating tablet Take 1 tablet (4 mg total) by mouth every 8 (eight) hours as needed for nausea or vomiting. 02/24/24  Yes Harvest Deist, PA-C  pantoprazole  (PROTONIX ) 40 MG tablet Take 1 tablet (40 mg total) by mouth daily. 02/24/24  Yes Bronwen Pendergraft, PA-C  acetaminophen  (TYLENOL ) 325 MG tablet Take 2 tablets (650 mg total) by mouth every 4 (four) hours as needed for headache or mild pain. 05/21/23   Abbe Abate, MD  aspirin  EC (ASPIRIN  LOW DOSE) 81 MG tablet Take 1 tablet (81 mg total) by mouth daily. 05/30/23   Milford, Arlice Bene, FNP  atorvastatin  (LIPITOR) 20 MG tablet Take 1 tablet (20 mg  total) by mouth daily. 05/30/23   Milford, Arlice Bene, FNP  calcium  acetate (PHOSLO) 667 MG capsule Take 2,001 mg by mouth 3 (three) times daily with meals. 11/16/23   [provider]  carvedilol  (COREG ) 6.25 MG tablet Take 1 tablet (6.25 mg total) by mouth 2 (two) times daily. 06/04/23     cefTAZidime  1 g in sodium chloride  0.9 % 100 mL Inject 1 g into the vein Every Tuesday,Thursday,and Saturday with dialysis. 12/08/23   Shahmehdi, Constantino Demark, MD  famotidine  (PEPCID ) 40 MG tablet Take 40 mg by mouth as needed for heartburn or indigestion. 08/04/23   [provider]  furosemide  (LASIX ) 80 MG tablet Take 80 mg by mouth daily. Take 1 tablet by mouth on Monday,Wednesday,Friday and Sunday. 11/19/23   [provider]  hydrALAZINE  (APRESOLINE ) 25 MG tablet Take 1 tablet (25 mg total) by mouth 3 (three) times daily. Patient taking differently: Take 25 mg by mouth 3 (three) times daily. Except on Dialysis days 05/30/23   Elmarie Hacking, FNP  isosorbide  mononitrate (IMDUR ) 30 MG 24 hr tablet Take 1 tablet (30 mg total) by mouth daily. 05/30/23   Milford, Arlice Bene, FNP  LOKELMA 10 g PACK packet Take 10 g by mouth See admin instructions. Take on days Dialysis is missed. 10/04/23   [provider]  predniSONE  (DELTASONE ) 20 MG tablet Take 2 tablets (40 mg total) by mouth daily for up to 5 days during gout flares 06/22/23  sacubitril -valsartan  (ENTRESTO ) 24-26 MG Take 1 tablet by mouth 2 (two) times daily. 05/21/23   Abbe Abate, MD  vancomycin  (VANCOCIN ) 1-5 GM/200ML-% SOLN Inject 200 mLs (1,000 mg total) into the vein Every Tuesday,Thursday,and Saturday with dialysis. 12/08/23   Bobbetta Burnet, MD      Allergies    Albuterol and Tramadol     Review of Systems   Review of Systems  Constitutional:  Negative for chills and fever.  HENT:  Negative for congestion and sore throat.   Eyes: Negative.   Respiratory:  Negative for chest tightness and shortness of breath.    Cardiovascular:  Positive for chest pain.  Gastrointestinal:  Positive for nausea and vomiting. Negative for abdominal pain.  Genitourinary: Negative.   Musculoskeletal:  Negative for arthralgias, joint swelling and neck pain.  Skin: Negative.  Negative for rash and wound.  Neurological:  Negative for dizziness, weakness, light-headedness, numbness and headaches.  Psychiatric/Behavioral: Negative.      Physical Exam Updated Vital Signs BP (!) 143/104   Pulse 93   Temp 98 F (36.7 C) (Temporal)   Resp 15   Ht 6\' 1"  (1.854 m)   Wt 103.4 kg   SpO2 100%   BMI 30.08 kg/m  Physical Exam Vitals and nursing note reviewed.  Constitutional:      Appearance: He is well-developed.  HENT:     Head: Normocephalic and atraumatic.  Eyes:     Conjunctiva/sclera: Conjunctivae normal.  Cardiovascular:     Rate and Rhythm: Normal rate and regular rhythm.     Heart sounds: Normal heart sounds.  Pulmonary:     Effort: Pulmonary effort is normal.     Breath sounds: Normal breath sounds. No wheezing.  Abdominal:     General: Bowel sounds are normal.     Palpations: Abdomen is soft.     Tenderness: There is no abdominal tenderness.  Musculoskeletal:        General: Normal range of motion.     Cervical back: Normal range of motion.  Skin:    General: Skin is warm and dry.  Neurological:     General: No focal deficit present.     Mental Status: He is alert.     ED Results / Procedures / Treatments   Labs (all labs ordered are listed, but only abnormal results are displayed) Labs Reviewed  LIPASE, BLOOD - Abnormal; Notable for the following components:      Result Value   Lipase 73 (*)    All other components within normal limits  COMPREHENSIVE METABOLIC PANEL WITH GFR - Abnormal; Notable for the following components:   BUN 42 (*)    Creatinine, Ser 14.41 (*)    GFR, Estimated 4 (*)    All other components within normal limits  CBC - Abnormal; Notable for the following  components:   RBC 3.91 (*)    Hemoglobin 12.0 (*)    HCT 36.5 (*)    All other components within normal limits  URINALYSIS, ROUTINE W REFLEX MICROSCOPIC - Abnormal; Notable for the following components:   Glucose, UA 50 (*)    Hgb urine dipstick SMALL (*)    Protein, ur 100 (*)    All other components within normal limits  SEDIMENTATION RATE - Abnormal; Notable for the following components:   Sed Rate 21 (*)    All other components within normal limits  TROPONIN I (HIGH SENSITIVITY) - Abnormal; Notable for the following components:   Troponin I (High Sensitivity) 1,680 (*)  All other components within normal limits  TROPONIN I (HIGH SENSITIVITY) - Abnormal; Notable for the following components:   Troponin I (High Sensitivity) 1,542 (*)    All other components within normal limits  C-REACTIVE PROTEIN    EKG None ED ECG REPORT   Date: 02/25/2024  Rate: 80  Rhythm: normal sinus rhythm  QRS Axis: normal  Intervals: normal  ST/T Wave abnormalities: nonspecific T wave changes  Conduction Disutrbances:none  Narrative Interpretation:   Old EKG Reviewed: unchanged  I have personally reviewed the EKG tracing and agree with the computerized printout as noted.  Radiology No results found.  Procedures Procedures    Medications Ordered in ED Medications  metoCLOPramide  (REGLAN ) injection 10 mg (10 mg Intravenous Given 02/24/24 1713)  alum & mag hydroxide-simeth (MAALOX/MYLANTA) 200-200-20 MG/5ML suspension 30 mL (30 mLs Oral Given 02/24/24 1720)  aspirin  chewable tablet 324 mg (324 mg Oral Given 02/24/24 1756)  pantoprazole  (PROTONIX ) EC tablet 40 mg (40 mg Oral Given 02/24/24 1941)    ED Course/ Medical Decision Making/ A&P                                 Medical Decision Making Patient with a history of end-stage renal disease, nonischemic cardiomyopathy, hypertension, diabetes, CHF, GERD with a 3-day history of nausea vomiting and burning quality mid chest pain with poor p.o.  intake, denies abdominal pain, also denies shortness of breath, palpitations, diaphoresis, fevers or chills.  His last dialysis was yesterday, he had a full treatment, he has been unable to tolerate his home medications secondary to the nausea and vomiting.  He takes famotidine  only for his acid reflux disease which appears to not be well-controlled at this time.  Denies history of PUD.  He was given Protonix  here, however he was initially also given a dose of Maalox after which the burning pain completely resolved.  He does have significant risk factors for unstable angina, he has a reported history of non-STEMI during admission although his cardiac catheterization was negative for obstructive disease, he was ultimately diagnosed with nonischemic cardiomyopathy.  Troponins obtained today remain elevated, although his delta troponin is revealing for actually declining from 1682 to 1542.  Her conversation with Dr. Mallipeddi, she would not intervene from a cardiac standpoint unless his troponins escalated to more than 5000.  Initially I encouraged patient to be admitted for overnight observation so we could continue to trend his troponins, patient was not willing to be admitted at this time.  Given his second troponin was less than his first, and that his chest pain resolved after receiving the Maalox suspect his immediate symptoms are secondary to GERD, his troponins may be consistently chronically elevated.  However patient is being sent home AMA with strict return precautions regarding any worsening symptoms.  Patient understands.  Amount and/or Complexity of Data Reviewed Labs: ordered.    Details: Troponin as mentioned above, urinalysis unremarkable except for proteinuria, CMET significant for BUN of 42 and a creatinine of 14.41, these numbers are stable.  He has a normal WBC count at 8.6, his hemoglobin is 12.0 which is stable ECG/medicine tests: ordered. Discussion of management or test interpretation  with external provider(s): Pt discussed with Dr. Mallipeddi concerning elevated troponin.  Review of chart, neg cardiac cath last fall, had significantly more elevated troponins then.  Recommends trending troponins, but unless continue to rise significantly (greater than 5000) would not intervene from cardiac standpoint.  Has asked for sed rate and crp which has been added.   Risk OTC drugs. Prescription drug management. Decision regarding hospitalization.           Final Clinical Impression(s) / ED Diagnoses Final diagnoses:  Gastroesophageal reflux disease, unspecified whether esophagitis present  Nausea and vomiting, unspecified vomiting type  Chest pain, unspecified type    Rx / DC Orders ED Discharge Orders          Ordered    pantoprazole  (PROTONIX ) 40 MG tablet  Daily        02/24/24 1923    ondansetron  (ZOFRAN -ODT) 4 MG disintegrating tablet  Every 8 hours PRN        02/24/24 1923              Stanley Helmuth, PA-C 02/25/24 7253

## 2024-02-24 NOTE — ED Triage Notes (Signed)
 Pt reports vomiting x 2 weeks.  Pt reports he gets and acid reflux sensation right before he vomits.  Pt says he had some diarrhea 2 weeks ago when this first started for a day or 2 but the vomiting has remained.

## 2024-02-24 NOTE — ED Notes (Signed)
 Pt/family received d/c paperwork at this time. After going over the paperwork any questions, comments, or concerns were answered to the best of this nurse's knowledge. The pt/family verbally acknowledged the teachings/instructions.

## 2024-02-25 DIAGNOSIS — Z992 Dependence on renal dialysis: Secondary | ICD-10-CM | POA: Diagnosis not present

## 2024-02-25 DIAGNOSIS — N186 End stage renal disease: Secondary | ICD-10-CM | POA: Diagnosis not present

## 2024-02-25 LAB — C-REACTIVE PROTEIN: CRP: 2.6 mg/dL — ABNORMAL HIGH (ref <1.0)

## 2024-03-01 DIAGNOSIS — Z992 Dependence on renal dialysis: Secondary | ICD-10-CM | POA: Diagnosis not present

## 2024-03-01 DIAGNOSIS — N186 End stage renal disease: Secondary | ICD-10-CM | POA: Diagnosis not present

## 2024-03-03 DIAGNOSIS — N186 End stage renal disease: Secondary | ICD-10-CM | POA: Diagnosis not present

## 2024-03-03 DIAGNOSIS — Z992 Dependence on renal dialysis: Secondary | ICD-10-CM | POA: Diagnosis not present

## 2024-03-06 DIAGNOSIS — N186 End stage renal disease: Secondary | ICD-10-CM | POA: Diagnosis not present

## 2024-03-06 DIAGNOSIS — Z992 Dependence on renal dialysis: Secondary | ICD-10-CM | POA: Diagnosis not present

## 2024-03-10 DIAGNOSIS — Z992 Dependence on renal dialysis: Secondary | ICD-10-CM | POA: Diagnosis not present

## 2024-03-10 DIAGNOSIS — N186 End stage renal disease: Secondary | ICD-10-CM | POA: Diagnosis not present

## 2024-03-15 DIAGNOSIS — Z992 Dependence on renal dialysis: Secondary | ICD-10-CM | POA: Diagnosis not present

## 2024-03-15 DIAGNOSIS — N186 End stage renal disease: Secondary | ICD-10-CM | POA: Diagnosis not present

## 2024-03-16 ENCOUNTER — Other Ambulatory Visit: Payer: Self-pay | Admitting: *Deleted

## 2024-03-16 DIAGNOSIS — I120 Hypertensive chronic kidney disease with stage 5 chronic kidney disease or end stage renal disease: Secondary | ICD-10-CM

## 2024-03-17 DIAGNOSIS — Z992 Dependence on renal dialysis: Secondary | ICD-10-CM | POA: Diagnosis not present

## 2024-03-17 DIAGNOSIS — N186 End stage renal disease: Secondary | ICD-10-CM | POA: Diagnosis not present

## 2024-03-19 DIAGNOSIS — N186 End stage renal disease: Secondary | ICD-10-CM | POA: Diagnosis not present

## 2024-03-19 DIAGNOSIS — Z992 Dependence on renal dialysis: Secondary | ICD-10-CM | POA: Diagnosis not present

## 2024-03-20 ENCOUNTER — Other Ambulatory Visit (HOSPITAL_COMMUNITY): Payer: Self-pay | Admitting: Family Medicine

## 2024-03-20 DIAGNOSIS — E785 Hyperlipidemia, unspecified: Secondary | ICD-10-CM

## 2024-03-20 DIAGNOSIS — N186 End stage renal disease: Secondary | ICD-10-CM | POA: Diagnosis not present

## 2024-03-20 DIAGNOSIS — Z992 Dependence on renal dialysis: Secondary | ICD-10-CM | POA: Diagnosis not present

## 2024-03-21 ENCOUNTER — Other Ambulatory Visit: Payer: Self-pay

## 2024-03-21 ENCOUNTER — Other Ambulatory Visit (HOSPITAL_COMMUNITY): Payer: Self-pay

## 2024-03-21 MED ORDER — HYDRALAZINE HCL 25 MG PO TABS
25.0000 mg | ORAL_TABLET | Freq: Three times a day (TID) | ORAL | 3 refills | Status: DC
  Filled 2024-03-21: qty 90, 30d supply, fill #0
  Filled 2024-04-15: qty 90, 30d supply, fill #1

## 2024-03-21 MED ORDER — ATORVASTATIN CALCIUM 20 MG PO TABS
20.0000 mg | ORAL_TABLET | Freq: Every day | ORAL | 3 refills | Status: AC
  Filled 2024-03-21: qty 90, 90d supply, fill #0
  Filled 2024-04-15 – 2024-07-11 (×2): qty 90, 90d supply, fill #1

## 2024-03-24 DIAGNOSIS — Z992 Dependence on renal dialysis: Secondary | ICD-10-CM | POA: Diagnosis not present

## 2024-03-24 DIAGNOSIS — N186 End stage renal disease: Secondary | ICD-10-CM | POA: Diagnosis not present

## 2024-03-28 ENCOUNTER — Ambulatory Visit: Attending: Vascular Surgery | Admitting: Vascular Surgery

## 2024-03-28 ENCOUNTER — Ambulatory Visit (HOSPITAL_COMMUNITY)

## 2024-03-28 ENCOUNTER — Ambulatory Visit (HOSPITAL_COMMUNITY): Attending: Nurse Practitioner

## 2024-03-29 DIAGNOSIS — N186 End stage renal disease: Secondary | ICD-10-CM | POA: Diagnosis not present

## 2024-03-29 DIAGNOSIS — Z992 Dependence on renal dialysis: Secondary | ICD-10-CM | POA: Diagnosis not present

## 2024-03-31 DIAGNOSIS — Z992 Dependence on renal dialysis: Secondary | ICD-10-CM | POA: Diagnosis not present

## 2024-03-31 DIAGNOSIS — N186 End stage renal disease: Secondary | ICD-10-CM | POA: Diagnosis not present

## 2024-04-03 DIAGNOSIS — Z992 Dependence on renal dialysis: Secondary | ICD-10-CM | POA: Diagnosis not present

## 2024-04-03 DIAGNOSIS — N186 End stage renal disease: Secondary | ICD-10-CM | POA: Diagnosis not present

## 2024-04-07 DIAGNOSIS — N186 End stage renal disease: Secondary | ICD-10-CM | POA: Diagnosis not present

## 2024-04-07 DIAGNOSIS — Z992 Dependence on renal dialysis: Secondary | ICD-10-CM | POA: Diagnosis not present

## 2024-04-10 DIAGNOSIS — Z992 Dependence on renal dialysis: Secondary | ICD-10-CM | POA: Diagnosis not present

## 2024-04-10 DIAGNOSIS — N186 End stage renal disease: Secondary | ICD-10-CM | POA: Diagnosis not present

## 2024-04-14 DIAGNOSIS — N186 End stage renal disease: Secondary | ICD-10-CM | POA: Diagnosis not present

## 2024-04-14 DIAGNOSIS — Z992 Dependence on renal dialysis: Secondary | ICD-10-CM | POA: Diagnosis not present

## 2024-04-15 ENCOUNTER — Other Ambulatory Visit (HOSPITAL_COMMUNITY): Payer: Self-pay

## 2024-04-15 ENCOUNTER — Other Ambulatory Visit (HOSPITAL_COMMUNITY): Payer: Self-pay | Admitting: Family Medicine

## 2024-04-15 DIAGNOSIS — I10 Essential (primary) hypertension: Secondary | ICD-10-CM

## 2024-04-15 DIAGNOSIS — Z8673 Personal history of transient ischemic attack (TIA), and cerebral infarction without residual deficits: Secondary | ICD-10-CM

## 2024-04-16 ENCOUNTER — Other Ambulatory Visit (HOSPITAL_COMMUNITY): Payer: Self-pay

## 2024-04-17 ENCOUNTER — Other Ambulatory Visit: Payer: Self-pay

## 2024-04-18 ENCOUNTER — Other Ambulatory Visit (HOSPITAL_COMMUNITY): Payer: Self-pay

## 2024-04-18 MED ORDER — ASPIRIN 81 MG PO TBEC
81.0000 mg | DELAYED_RELEASE_TABLET | Freq: Every day | ORAL | 2 refills | Status: AC
  Filled 2024-04-18: qty 30, 30d supply, fill #0
  Filled 2024-07-11: qty 30, 30d supply, fill #1

## 2024-04-18 MED ORDER — ISOSORBIDE MONONITRATE ER 30 MG PO TB24
30.0000 mg | ORAL_TABLET | Freq: Every day | ORAL | 2 refills | Status: AC
  Filled 2024-04-18: qty 30, 30d supply, fill #0

## 2024-04-19 DIAGNOSIS — Z992 Dependence on renal dialysis: Secondary | ICD-10-CM | POA: Diagnosis not present

## 2024-04-19 DIAGNOSIS — N186 End stage renal disease: Secondary | ICD-10-CM | POA: Diagnosis not present

## 2024-04-21 ENCOUNTER — Telehealth: Admitting: Nurse Practitioner

## 2024-04-21 DIAGNOSIS — N186 End stage renal disease: Secondary | ICD-10-CM | POA: Diagnosis not present

## 2024-04-21 DIAGNOSIS — Z992 Dependence on renal dialysis: Secondary | ICD-10-CM | POA: Diagnosis not present

## 2024-04-21 DIAGNOSIS — L739 Follicular disorder, unspecified: Secondary | ICD-10-CM

## 2024-04-21 MED ORDER — CLINDAMYCIN PHOS (TWICE-DAILY) 1 % EX GEL: Freq: Two times a day (BID) | CUTANEOUS | 0 refills | Status: DC

## 2024-04-21 NOTE — Progress Notes (Signed)
 E Visit for Rash  We are sorry that you are not feeling well. Here is how we plan to help!   Based upon what you have shared with me it looks like you have a bacterial follicultits.  Folliculitis is inflammation of the hair follicles that can be caused by a superficial infection of the skin and is treated with an antibiotic. I have prescribed:  Topical clindamycin   Oral antibiotics would not be recommended with you already being administered 2 different IV antibiotics with HD treatments     HOME CARE:  Take cool showers and avoid direct sunlight. Apply cool compress or wet dressings. Take a bath in an oatmeal bath.  Sprinkle content of one Aveeno packet under running faucet with comfortably warm water.  Bathe for 15-20 minutes, 1-2 times daily.  Pat dry with a towel. Do not rub the rash. Use hydrocortisone cream. Take an antihistamine like Benadryl  for widespread rashes that itch.  The adult dose of Benadryl  is 25-50 mg by mouth 4 times daily. Caution:  This type of medication may cause sleepiness.  Do not drink alcohol, drive, or operate dangerous machinery while taking antihistamines.  Do not take these medications if you have prostate enlargement.  Read package instructions thoroughly on all medications that you take.  GET HELP RIGHT AWAY IF:  Symptoms don't go away after treatment. Severe itching that persists. If you rash spreads or swells. If you rash begins to smell. If it blisters and opens or develops a yellow-brown crust. You develop a fever. You have a sore throat. You become short of breath.  MAKE SURE YOU:  Understand these instructions. Will watch your condition. Will get help right away if you are not doing well or get worse.  Thank you for choosing an e-visit.  Your e-visit answers were reviewed by a board certified advanced clinical practitioner to complete your personal care plan. Depending upon the condition, your plan could have included both over the  counter or prescription medications.  Please review your pharmacy choice. Make sure the pharmacy is open so you can pick up prescription now. If there is a problem, you may contact your provider through [employer] and have the prescription routed to another pharmacy.  Your safety is important to us . If you have drug allergies check your prescription carefully.   For the next 24 hours you can use MyChart to ask questions about today's visit, request a non-urgent call back, or ask for a work or school excuse. You will get an email in the next two days asking about your experience. I hope that your e-visit has been valuable and will speed your recovery.

## 2024-04-21 NOTE — Progress Notes (Signed)
 I have spent 5 minutes in review of e-visit questionnaire, review and updating patient chart, medical decision making and response to patient.   Claiborne Rigg, NP

## 2024-04-24 ENCOUNTER — Other Ambulatory Visit (HOSPITAL_COMMUNITY): Payer: Self-pay

## 2024-04-24 DIAGNOSIS — N186 End stage renal disease: Secondary | ICD-10-CM | POA: Diagnosis not present

## 2024-04-24 DIAGNOSIS — Z992 Dependence on renal dialysis: Secondary | ICD-10-CM | POA: Diagnosis not present

## 2024-04-24 MED ORDER — CARVEDILOL 25 MG PO TABS
25.0000 mg | ORAL_TABLET | Freq: Two times a day (BID) | ORAL | 3 refills | Status: DC
  Filled 2024-04-24 (×3): qty 180, 90d supply, fill #0
  Filled 2024-07-11: qty 180, 90d supply, fill #1

## 2024-04-24 MED ORDER — PREDNISONE 20 MG PO TABS
20.0000 mg | ORAL_TABLET | ORAL | 0 refills | Status: DC
  Filled 2024-04-24 (×2): qty 30, 15d supply, fill #0

## 2024-04-28 DIAGNOSIS — N186 End stage renal disease: Secondary | ICD-10-CM | POA: Diagnosis not present

## 2024-04-28 DIAGNOSIS — Z992 Dependence on renal dialysis: Secondary | ICD-10-CM | POA: Diagnosis not present

## 2024-05-01 DIAGNOSIS — N186 End stage renal disease: Secondary | ICD-10-CM | POA: Diagnosis not present

## 2024-05-01 DIAGNOSIS — Z992 Dependence on renal dialysis: Secondary | ICD-10-CM | POA: Diagnosis not present

## 2024-05-05 DIAGNOSIS — Z992 Dependence on renal dialysis: Secondary | ICD-10-CM | POA: Diagnosis not present

## 2024-05-05 DIAGNOSIS — N186 End stage renal disease: Secondary | ICD-10-CM | POA: Diagnosis not present

## 2024-05-07 ENCOUNTER — Other Ambulatory Visit (HOSPITAL_COMMUNITY): Payer: Self-pay

## 2024-05-10 DIAGNOSIS — N186 End stage renal disease: Secondary | ICD-10-CM | POA: Diagnosis not present

## 2024-05-10 DIAGNOSIS — Z992 Dependence on renal dialysis: Secondary | ICD-10-CM | POA: Diagnosis not present

## 2024-05-14 ENCOUNTER — Other Ambulatory Visit (HOSPITAL_COMMUNITY): Payer: Self-pay

## 2024-05-15 DIAGNOSIS — Z992 Dependence on renal dialysis: Secondary | ICD-10-CM | POA: Diagnosis not present

## 2024-05-15 DIAGNOSIS — N186 End stage renal disease: Secondary | ICD-10-CM | POA: Diagnosis not present

## 2024-05-17 ENCOUNTER — Encounter: Payer: Self-pay | Admitting: Pharmacist

## 2024-05-17 ENCOUNTER — Other Ambulatory Visit (HOSPITAL_COMMUNITY): Payer: Self-pay

## 2024-05-17 ENCOUNTER — Other Ambulatory Visit: Payer: Self-pay

## 2024-05-17 DIAGNOSIS — N186 End stage renal disease: Secondary | ICD-10-CM | POA: Diagnosis not present

## 2024-05-17 DIAGNOSIS — Z992 Dependence on renal dialysis: Secondary | ICD-10-CM | POA: Diagnosis not present

## 2024-05-17 MED ORDER — DOXYCYCLINE HYCLATE 100 MG PO CAPS
100.0000 mg | ORAL_CAPSULE | Freq: Two times a day (BID) | ORAL | 0 refills | Status: DC
  Filled 2024-05-17: qty 20, 10d supply, fill #0

## 2024-05-20 DIAGNOSIS — Z992 Dependence on renal dialysis: Secondary | ICD-10-CM | POA: Diagnosis not present

## 2024-05-20 DIAGNOSIS — N186 End stage renal disease: Secondary | ICD-10-CM | POA: Diagnosis not present

## 2024-05-24 DIAGNOSIS — Z992 Dependence on renal dialysis: Secondary | ICD-10-CM | POA: Diagnosis not present

## 2024-05-24 DIAGNOSIS — N186 End stage renal disease: Secondary | ICD-10-CM | POA: Diagnosis not present

## 2024-05-26 DIAGNOSIS — N186 End stage renal disease: Secondary | ICD-10-CM | POA: Diagnosis not present

## 2024-05-26 DIAGNOSIS — Z992 Dependence on renal dialysis: Secondary | ICD-10-CM | POA: Diagnosis not present

## 2024-05-29 DIAGNOSIS — N186 End stage renal disease: Secondary | ICD-10-CM | POA: Diagnosis not present

## 2024-05-29 DIAGNOSIS — Z992 Dependence on renal dialysis: Secondary | ICD-10-CM | POA: Diagnosis not present

## 2024-06-02 DIAGNOSIS — N186 End stage renal disease: Secondary | ICD-10-CM | POA: Diagnosis not present

## 2024-06-02 DIAGNOSIS — Z992 Dependence on renal dialysis: Secondary | ICD-10-CM | POA: Diagnosis not present

## 2024-06-05 DIAGNOSIS — N186 End stage renal disease: Secondary | ICD-10-CM | POA: Diagnosis not present

## 2024-06-05 DIAGNOSIS — Z992 Dependence on renal dialysis: Secondary | ICD-10-CM | POA: Diagnosis not present

## 2024-06-09 ENCOUNTER — Other Ambulatory Visit: Payer: Self-pay

## 2024-06-09 ENCOUNTER — Emergency Department
Admission: EM | Admit: 2024-06-09 | Discharge: 2024-06-09 | Attending: Emergency Medicine | Admitting: Emergency Medicine

## 2024-06-09 DIAGNOSIS — E875 Hyperkalemia: Secondary | ICD-10-CM | POA: Diagnosis not present

## 2024-06-09 DIAGNOSIS — Z992 Dependence on renal dialysis: Secondary | ICD-10-CM | POA: Insufficient documentation

## 2024-06-09 DIAGNOSIS — R519 Headache, unspecified: Secondary | ICD-10-CM | POA: Diagnosis present

## 2024-06-09 DIAGNOSIS — N186 End stage renal disease: Secondary | ICD-10-CM | POA: Diagnosis not present

## 2024-06-09 LAB — CBC WITH DIFFERENTIAL/PLATELET
Abs Immature Granulocytes: 0.01 K/uL (ref 0.00–0.07)
Basophils Absolute: 0 K/uL (ref 0.0–0.1)
Basophils Relative: 1 %
Eosinophils Absolute: 0.1 K/uL (ref 0.0–0.5)
Eosinophils Relative: 1 %
HCT: 38.5 % — ABNORMAL LOW (ref 39.0–52.0)
Hemoglobin: 12.5 g/dL — ABNORMAL LOW (ref 13.0–17.0)
Immature Granulocytes: 0 %
Lymphocytes Relative: 38 %
Lymphs Abs: 2.7 K/uL (ref 0.7–4.0)
MCH: 30.4 pg (ref 26.0–34.0)
MCHC: 32.5 g/dL (ref 30.0–36.0)
MCV: 93.7 fL (ref 80.0–100.0)
Monocytes Absolute: 0.4 K/uL (ref 0.1–1.0)
Monocytes Relative: 6 %
Neutro Abs: 4 K/uL (ref 1.7–7.7)
Neutrophils Relative %: 54 %
Platelets: 207 K/uL (ref 150–400)
RBC: 4.11 MIL/uL — ABNORMAL LOW (ref 4.22–5.81)
RDW: 14.4 % (ref 11.5–15.5)
WBC: 7.2 K/uL (ref 4.0–10.5)
nRBC: 0 % (ref 0.0–0.2)

## 2024-06-09 LAB — BASIC METABOLIC PANEL WITH GFR
Anion gap: 16 — ABNORMAL HIGH (ref 5–15)
BUN: 84 mg/dL — ABNORMAL HIGH (ref 6–20)
CO2: 20 mmol/L — ABNORMAL LOW (ref 22–32)
Calcium: 9 mg/dL (ref 8.9–10.3)
Chloride: 102 mmol/L (ref 98–111)
Creatinine, Ser: 19.63 mg/dL — ABNORMAL HIGH (ref 0.61–1.24)
GFR, Estimated: 3 mL/min — ABNORMAL LOW (ref >60)
Glucose, Bld: 73 mg/dL (ref 70–99)
Potassium: 5.2 mmol/L — ABNORMAL HIGH (ref 3.5–5.1)
Sodium: 138 mmol/L (ref 135–145)

## 2024-06-09 MED ORDER — SODIUM ZIRCONIUM CYCLOSILICATE 10 G PO PACK
10.0000 g | PACK | Freq: Once | ORAL | Status: AC
  Administered 2024-06-09: 10 g via ORAL
  Filled 2024-06-09: qty 1

## 2024-06-09 NOTE — ED Provider Notes (Signed)
 [facility] Provider Note    Event Date/Time   First MD Initiated Contact with Patient 06/09/24 1727     (approximate)   History   Missed dialysis   HPI  Zachary Lynch is a 38 y.o. male presents to the emergency department today after having missed dialysis.  The patient is under police custody.  Missed his dialysis session Thursday and then today.  He denies any shortness of breath.  He states that he does make urine and does not retain fluid.  He has had a little headache which he thinks might be due to elevated blood pressure.  He has not had his blood pressure medication in a couple of days.     Physical Exam   Triage Vital Signs: ED Triage Vitals  Encounter Vitals Group     BP 06/09/24 1625 (!) 178/119     Girls Systolic BP Percentile --      Girls Diastolic BP Percentile --      Boys Systolic BP Percentile --      Boys Diastolic BP Percentile --      Pulse Rate 06/09/24 1625 65     Resp 06/09/24 1625 19     Temp 06/09/24 1625 98.1 F (36.7 C)     Temp src --      SpO2 06/09/24 1625 100 %     Weight 06/09/24 1626 232 lb (105.2 kg)     Height 06/09/24 1626 6' 1 (1.854 m)     Head Circumference --      Peak Flow --      Pain Score 06/09/24 1626 0     Pain Loc --      Pain Education --      Exclude from Growth Chart --     Most recent vital signs: Vitals:   06/09/24 1625  BP: (!) 178/119  Pulse: 65  Resp: 19  Temp: 98.1 F (36.7 C)  SpO2: 100%   General: Awake, alert, oriented. CV:  Good peripheral perfusion. Regular rate and rhythm. Resp:  Normal effort. Lungs clear. Abd:  No distention.     ED Results / Procedures / Treatments   Labs (all labs ordered are listed, but only abnormal results are displayed) Labs Reviewed  CBC WITH DIFFERENTIAL/PLATELET  BASIC METABOLIC PANEL WITH GFR     EKG  I, Guadalupe Eagles, attending physician, personally viewed and interpreted this EKG  EKG Time: 1757 Rate: 58 Rhythm: sinus  bradycardia Axis: left axis deviation Intervals: qtc 469 QRS: narrow, q waves v1, v2  ST changes: No st elevation. No peaked t waves Impression: abnormal ekg   RADIOLOGY None   PROCEDURES:  Critical Care performed: No    MEDICATIONS ORDERED IN ED: Medications - No data to display   IMPRESSION / MDM / ASSESSMENT AND PLAN / ED COURSE  I reviewed the triage vital signs and the nursing notes.                              Differential diagnosis includes, but is not limited to, hyperkalemia, fluid overload  Patient's presentation is most consistent with acute presentation with potential threat to life or bodily function.   Patient presented to the emergency department today because he has missed 2 dialysis sessions.  Only complaint is for slight headache.  I do think the headache is likely related to hypertension.  Will check blood work and EKG for  signs of hyperkalemia.  EKG without any concerning findings.  Potassium very minimally elevated at 5.2.  Discussed with Dr. Lazarus with nephrology.  Will give dose of Lokelma  here.  Did asked that the patient contact his dialysis center Monday for a course of dialysis and then to resume his dialysis on Tuesday. Encouraged patient to resume his blood pressure medication as prescribed.     FINAL CLINICAL IMPRESSION(S) / ED DIAGNOSES   Final diagnoses:  ESRD (end stage renal disease) on dialysis [facility])     Note:  This document was prepared using Dragon voice recognition software and may include unintentional dictation errors.    Floy Roberts, MD 06/09/24 1910

## 2024-06-09 NOTE — Discharge Instructions (Signed)
 Please contact your dialysis center Monday to receive dialysis Monday, and then please resume your normal schedule. Please take your blood pressure medication as prescribed.

## 2024-06-09 NOTE — ED Notes (Signed)
 Pt to ED in [employer] custody for missing 2 dialysis sessions.

## 2024-06-09 NOTE — ED Triage Notes (Signed)
 Pt comes in via [employer] custody after missing dialysis on Thursday. Pt usually has dialysis on Tuesday, Thursday and Saturday. Pt was last dialysis on Tuesday. Pt has no complaints of pain at this time. Pt also hasn't taken his blood pressure medication. Pt is hypertensive in triage 178/119.  Pt cooperative in triage.

## 2024-06-12 DIAGNOSIS — N186 End stage renal disease: Secondary | ICD-10-CM | POA: Diagnosis not present

## 2024-06-12 DIAGNOSIS — Z992 Dependence on renal dialysis: Secondary | ICD-10-CM | POA: Diagnosis not present

## 2024-06-14 DIAGNOSIS — N186 End stage renal disease: Secondary | ICD-10-CM | POA: Diagnosis not present

## 2024-06-14 DIAGNOSIS — Z992 Dependence on renal dialysis: Secondary | ICD-10-CM | POA: Diagnosis not present

## 2024-06-16 DIAGNOSIS — Z992 Dependence on renal dialysis: Secondary | ICD-10-CM | POA: Diagnosis not present

## 2024-06-16 DIAGNOSIS — N186 End stage renal disease: Secondary | ICD-10-CM | POA: Diagnosis not present

## 2024-06-19 DIAGNOSIS — Z992 Dependence on renal dialysis: Secondary | ICD-10-CM | POA: Diagnosis not present

## 2024-06-19 DIAGNOSIS — N186 End stage renal disease: Secondary | ICD-10-CM | POA: Diagnosis not present

## 2024-06-21 DIAGNOSIS — N186 End stage renal disease: Secondary | ICD-10-CM | POA: Diagnosis not present

## 2024-06-21 DIAGNOSIS — Z992 Dependence on renal dialysis: Secondary | ICD-10-CM | POA: Diagnosis not present

## 2024-06-21 DIAGNOSIS — Z23 Encounter for immunization: Secondary | ICD-10-CM | POA: Diagnosis not present

## 2024-06-23 DIAGNOSIS — N186 End stage renal disease: Secondary | ICD-10-CM | POA: Diagnosis not present

## 2024-06-23 DIAGNOSIS — Z23 Encounter for immunization: Secondary | ICD-10-CM | POA: Diagnosis not present

## 2024-06-23 DIAGNOSIS — Z992 Dependence on renal dialysis: Secondary | ICD-10-CM | POA: Diagnosis not present

## 2024-06-28 DIAGNOSIS — Z23 Encounter for immunization: Secondary | ICD-10-CM | POA: Diagnosis not present

## 2024-06-28 DIAGNOSIS — N186 End stage renal disease: Secondary | ICD-10-CM | POA: Diagnosis not present

## 2024-06-28 DIAGNOSIS — Z992 Dependence on renal dialysis: Secondary | ICD-10-CM | POA: Diagnosis not present

## 2024-06-30 ENCOUNTER — Emergency Department (HOSPITAL_BASED_OUTPATIENT_CLINIC_OR_DEPARTMENT_OTHER)
Admission: EM | Admit: 2024-06-30 | Discharge: 2024-06-30 | Disposition: A | Discharge status: Home / Self Care | Attending: Emergency Medicine | Admitting: Emergency Medicine

## 2024-06-30 ENCOUNTER — Encounter (HOSPITAL_BASED_OUTPATIENT_CLINIC_OR_DEPARTMENT_OTHER): Payer: Self-pay | Admitting: Emergency Medicine

## 2024-06-30 ENCOUNTER — Other Ambulatory Visit: Payer: Self-pay

## 2024-06-30 DIAGNOSIS — L0201 Cutaneous abscess of face: Secondary | ICD-10-CM | POA: Diagnosis not present

## 2024-06-30 DIAGNOSIS — H00033 Abscess of eyelid right eye, unspecified eyelid: Secondary | ICD-10-CM | POA: Insufficient documentation

## 2024-06-30 DIAGNOSIS — N189 Chronic kidney disease, unspecified: Secondary | ICD-10-CM | POA: Diagnosis not present

## 2024-06-30 DIAGNOSIS — Z7982 Long term (current) use of aspirin: Secondary | ICD-10-CM | POA: Insufficient documentation

## 2024-06-30 DIAGNOSIS — Z992 Dependence on renal dialysis: Secondary | ICD-10-CM | POA: Diagnosis not present

## 2024-06-30 DIAGNOSIS — N492 Inflammatory disorders of scrotum: Secondary | ICD-10-CM | POA: Diagnosis not present

## 2024-06-30 MED ORDER — LIDOCAINE HCL (PF) 1 % IJ SOLN
30.0000 mL | Freq: Once | INTRAMUSCULAR | Status: AC
  Administered 2024-06-30: 15 mL via INTRADERMAL
  Filled 2024-06-30: qty 30

## 2024-06-30 MED ORDER — CLINDAMYCIN HCL 150 MG PO CAPS: 150.0000 mg | ORAL_CAPSULE | Freq: Four times a day (QID) | ORAL | 0 refills | Status: DC

## 2024-06-30 MED ORDER — MORPHINE SULFATE (PF) 4 MG/ML IV SOLN
4.0000 mg | Freq: Once | INTRAVENOUS | Status: AC
  Administered 2024-06-30: 4 mg via INTRAMUSCULAR
  Filled 2024-06-30: qty 1

## 2024-06-30 NOTE — Discharge Instructions (Addendum)
 It was a pleasure taking care of you today. You were seen in the Emergency Department for a scrotal and outer abscess. Your work-up was reassuring.  I was able to successfully drain your scrotal abscess.  I placed iodoform packing in the wound.  Please remove the packing in 24 to 48 hours with close follow-up for wound healing and infection.  I am also prescribing you with a 1 week course of clindamycin  for continued infection prevention.  Please take the medication as directed on the prescription.  Make sure to complete full course of antibiotics.    I recommend following up with a PCP for further management.  I have provided you with a referral to the [facility].  You will need to call them to schedule an appointment.  Their contact information is provided with your discharge instructions.  Please return to the ER if you experience chest pain, trouble breathing, intractable nausea/vomiting or any other life threatening illnesses.

## 2024-06-30 NOTE — ED Provider Notes (Signed)
 2:41 PM Patient seen in conjunction with Dufour PA-C.  Patient presents to the emergency department today for evaluation of swelling to the right supraorbital rim as well as the left scrotum.  Patient has a history of scrotal abscess.  No fevers.  Area is painful, restricting movement of his leg due to pain with movement of the leg.  Requesting drainage.  Did not go to dialysis today.  No respiratory symptoms or severe lower extremity swelling.  Plan for attempted I&D, recommend needle aspiration right elbow area and superficial I&D to the left scrotal area.  Patient states that he had scrotal abscess that had broken open after an airplane flight and drained in the past.   Zachary Chew, PA-C 06/30/24 1541    [name], MD 06/30/24 [phone]

## 2024-06-30 NOTE — ED Provider Notes (Signed)
 [facility] AT [facility] Provider Note   CSN: [id number] Arrival date & time: 06/30/24  1233     Patient presents with: Abscess   Zachary Lynch is a 38 y.o. male with past medical history of CKD on dialysis, multiple abscesses who presents the emergency department for evaluation of right eyebrow and left groin abscess.  Patient states he first noticed the abscesses on Monday, and they have gotten progressively larger and more painful.  He has had a history of multiple abscesses in the past requiring drainage.   Abscess      Prior to Admission medications   Medication Sig Start Date End Date Taking? Authorizing Provider  clindamycin  (CLEOCIN ) 150 MG capsule Take 1 capsule (150 mg total) by mouth every 6 (six) hours. 06/30/24  Yes Zared Knoth, Marry GORMAN, PA-C  acetaminophen  (TYLENOL ) 325 MG tablet Take 2 tablets (650 mg total) by mouth every 4 (four) hours as needed for headache or mild pain. 05/21/23   Danton Reyes DASEN, MD  aspirin  EC (ASPIRIN  LOW DOSE) 81 MG tablet Take 1 tablet (81 mg total) by mouth daily. 04/18/24   [name], [name], MD  atorvastatin  (LIPITOR) 20 MG tablet Take 1 tablet (20 mg total) by mouth daily. 03/21/24   Milford, Harlene HERO, FNP  calcium  acetate (PHOSLO) 667 MG capsule Take 2,001 mg by mouth 3 (three) times daily with meals. 11/16/23   [provider]  carvedilol  (COREG ) 25 MG tablet Take 1 tablet (25 mg total) by mouth 2 (two) times daily. 04/24/24     carvedilol  (COREG ) 6.25 MG tablet Take 1 tablet (6.25 mg total) by mouth 2 (two) times daily. 06/04/23     cefTAZidime  1 g in sodium chloride  0.9 % 100 mL Inject 1 g into the vein Every Tuesday,Thursday,and Saturday with dialysis. 12/08/23   Shahmehdi, Adriana LABOR, MD  clindamycin  (CLINDAGEL) 1 % gel Apply topically 2 (two) times daily. 04/21/24   Fleming, Zelda W, NP  famotidine  (PEPCID ) 40 MG tablet Take 40 mg by mouth as needed for heartburn or indigestion. 08/04/23   [provider]  furosemide  (LASIX ) 80 MG tablet Take 80 mg by mouth daily. Take 1 tablet by mouth on Monday,Wednesday,Friday and Sunday. 11/19/23   [provider]  hydrALAZINE  (APRESOLINE ) 25 MG tablet Take 1 tablet (25 mg total) by mouth 3 (three) times daily. 03/21/24   Glena Harlene HERO, FNP  isosorbide  mononitrate (IMDUR ) 30 MG 24 hr tablet Take 1 tablet (30 mg total) by mouth daily. 04/18/24   [name], [name], MD  LOKELMA  10 g PACK packet Take 10 g by mouth See admin instructions. Take on days Dialysis is missed. 10/04/23   [provider]  ondansetron  (ZOFRAN -ODT) 4 MG disintegrating tablet Take 1 tablet (4 mg total) by mouth every 8 (eight) hours as needed for nausea or vomiting. 02/24/24   [name], [name], PA-C  pantoprazole  (PROTONIX ) 40 MG tablet Take 1 tablet (40 mg total) by mouth daily. 02/24/24   [name], [name], PA-C  predniSONE  (DELTASONE ) 20 MG tablet Take 2 tablets (40 mg total) by mouth daily for up to 5 days during gout flares. 04/24/24     sacubitril -valsartan  (ENTRESTO ) 24-26 MG Take 1 tablet by mouth 2 (two) times daily. 05/21/23   Danton Reyes DASEN, MD  vancomycin  (VANCOCIN ) 1-5 GM/200ML-% SOLN Inject 200 mLs (1,000 mg total) into the vein Every Tuesday,Thursday,and Saturday with dialysis. 12/08/23   Willette Adriana LABOR, MD    Allergies: Albuterol and Tramadol   Review of Systems  Skin:  Positive for wound.    Updated Vital Signs BP (!) 138/105   Pulse 70   Temp 98.5 F (36.9 C) (Oral)   Resp 15   SpO2 98%   Physical Exam Vitals and nursing note reviewed.  Constitutional:      Appearance: Normal appearance. He is not ill-appearing.  HENT:     Head: Normocephalic and atraumatic.     Mouth/Throat:     Mouth: Mucous membranes are moist.  Eyes:     General: No scleral icterus.       Right eye: No discharge.        Left eye: No discharge.     Conjunctiva/sclera: Conjunctivae normal.     Comments: Right eyebrow circular fluctuant abscess approximately 1-1/2  cm  Cardiovascular:     Rate and Rhythm: Normal rate and regular rhythm.     Pulses: Normal pulses.  Pulmonary:     Effort: Pulmonary effort is normal. No respiratory distress.     Breath sounds: Normal breath sounds.  Abdominal:     General: There is no distension.     Tenderness: There is no abdominal tenderness.  Genitourinary:    Comments: Left-sided fluctuant abscess measuring approximately 6 cm just superior to left testicle. Musculoskeletal:        General: No deformity.     Cervical back: Normal range of motion.  Skin:    General: Skin is warm and dry.     Capillary Refill: Capillary refill takes less than 2 seconds.     Coloration: Skin is not jaundiced.  Neurological:     General: No focal deficit present.     Mental Status: He is alert.     Motor: No weakness.  Psychiatric:        Mood and Affect: Mood normal.     (all labs ordered are listed, but only abnormal results are displayed) Labs Reviewed - No data to display  EKG: None  Radiology: No results found.   .Incision and Drainage  Date/Time: 06/30/2024 3:04 PM  Performed by: Torrence Marry RAMAN, PA-C Authorized by: Torrence Marry RAMAN, PA-C   Consent:    Consent obtained:  Verbal   Consent given by:  Patient   Risks discussed:  Bleeding, incomplete drainage, pain, damage to other organs and infection   Alternatives discussed:  No treatment Universal protocol:    Procedure explained and questions answered to patient or proxy's satisfaction: yes     Relevant documents present and verified: yes     Test results available : yes     Imaging studies available: yes     Required blood products, implants, devices, and special equipment available: yes     Site/side marked: yes     Immediately prior to procedure, a time out was called: yes     Patient identity confirmed:  Verbally with patient Location:    Type:  Abscess   Size:  6 cm   Location:  Anogenital   Anogenital location:  Scrotal  space Pre-procedure details:    Skin preparation:  Betadine Sedation:    Sedation type:  None Anesthesia:    Anesthesia method:  Local infiltration   Local anesthetic:  Lidocaine  1% WITH epi Procedure type:    Complexity:  Complex Procedure details:    Ultrasound guidance: no     Needle aspiration: no     Incision types:  Single straight   Incision depth:  Subcutaneous   Wound management:  Probed and deloculated, irrigated with saline and extensive cleaning   Drainage:  Purulent   Drainage amount:  Copious   Wound treatment:  Wound left open   Packing materials:  1/4 in iodoform gauze Post-procedure details:    Procedure completion:  Tolerated with difficulty .Incision and Drainage  Date/Time: 06/30/2024 3:05 PM  Performed by: Torrence Marry RAMAN, PA-C Authorized by: Torrence Marry RAMAN, PA-C   Consent:    Consent obtained:  Verbal   Consent given by:  Patient   Risks discussed:  Bleeding, incomplete drainage, pain, damage to other organs and infection   Alternatives discussed:  No treatment Universal protocol:    Procedure explained and questions answered to patient or proxy's satisfaction: yes     Relevant documents present and verified: yes     Test results available : yes     Imaging studies available: yes     Required blood products, implants, devices, and special equipment available: yes     Site/side marked: yes     Immediately prior to procedure, a time out was called: yes     Patient identity confirmed:  Verbally with patient Location:    Type:  Abscess   Location:  Head   Head location:  R eyelid Pre-procedure details:    Skin preparation:  Betadine and chlorhexidine  with alcohol Sedation:    Sedation type:  None Anesthesia:    Anesthesia method:  Local infiltration   Local anesthetic:  Lidocaine  1% WITH epi Procedure type:    Complexity:  Simple Procedure details:    Ultrasound guidance: no     Needle aspiration: yes     Needle size:  20 G    Incision depth:  Subcutaneous   Drainage:  Purulent and bloody   Drainage amount:  Scant   Wound treatment:  Wound left open Post-procedure details:    Procedure completion:  Tolerated well, no immediate complications    Medications Ordered in the ED  lidocaine  (PF) (XYLOCAINE ) 1 % injection 30 mL (15 mLs Intradermal Given by Other 06/30/24 1525)  morphine  (PF) 4 MG/ML injection 4 mg (4 mg Intramuscular Given 06/30/24 1332)                                Medical Decision Making Risk Prescription drug management.   This patient presents to the ED for concern of multiple abscesses, this involves an extensive number of treatment options, and is a complaint that carries with it a high risk of complications and morbidity. Differential diagnosis includes: Recurrent staph/MRSA infection, hidradenitis suppurativa, folliculitis, diabetes, necrotizing fasciitis,  Co morbidities:  history of multiple prior abscesses  Additional history:  Patient has had multiple abscesses in the scrotal region in the past requiring drainage.  Most recent abscess was in March 2025, drained by urology.  Patient admitted at that time, due to secondary leukocytosis and cellulitis.  Lab Tests:  Not indicated at this time  Imaging Studies:  Not indicated  Cardiac Monitoring/ECG:  The patient was maintained on a cardiac monitor.  I personally viewed and interpreted the cardiac monitored which showed an underlying rhythm of: Normal sinus rhythm  Medicines ordered and prescription drug management:  I ordered medication including  Medications  lidocaine  (PF) (XYLOCAINE ) 1 % injection 30 mL (15 mLs Intradermal Given by Other 06/30/24 1525)  morphine  (PF) 4 MG/ML injection 4 mg (4 mg Intramuscular Given 06/30/24 1332)   Pain control Reevaluation of the  patient after these medicines showed that the patient improved I have reviewed the patients home medicines and have made adjustments as needed  Test  Considered:   none  Critical Interventions:   none  Consultations Obtained: None  Problem List / ED Course:     ICD-10-CM   1. Scrotum, abscess  N49.2     2. Abscess of eyebrow  L02.01       MDM: Patient is a 38 year old male who presents to the emergency department for evaluation of multiple abscesses.  1 cm abscess noted on right supraorbital region.  Second abscess noted on left scrotum approximately 6 cm in size.  Both abscesses are fluctuant and erythematous.  Tender to touch.  Patient given IM morphine  for pain control.  Scrotal abscess drained with copious purulent drainage.  Detailed procedure note written above.  Incision packed with quarter inch iodoform.  Supraorbital abscess attempted to be aspirated.  However, there was minimal purulent drainage.  Abscess covered with gauze to allow for further drainage.  Given patient's ESRD and hemodialysis, he is being prescribed 1 week dose of clindamycin  for infection prevention.  Patient provided discharge instructions on how to remove iodoform for scrotal abscess.  Patient verbalized understanding.  Given patient's recurrence of abscesses in the groin region, hidradenitis suppurativa is likely.  I have provided the patient with a referral to a PCP for further management.   Dispostion:  After consideration of the diagnostic results and the patients response to treatment, I feel that the patient would benefit from outpatient follow-up with a PCP.    Final diagnoses:  Abscess of eyebrow  Scrotum, abscess    ED Discharge Orders          Ordered    clindamycin  (CLEOCIN ) 150 MG capsule  Every 6 hours        06/30/24 1500               Tanea Moga S, PA-C 06/30/24 1549    [name], MD 06/30/24 [phone]

## 2024-06-30 NOTE — ED Triage Notes (Signed)
 Abscess in groin area and on face. States hx of same. Goes to dialysis Tues/Thurs/Sat. Did not got today.

## 2024-07-03 DIAGNOSIS — Z992 Dependence on renal dialysis: Secondary | ICD-10-CM | POA: Diagnosis not present

## 2024-07-03 DIAGNOSIS — N186 End stage renal disease: Secondary | ICD-10-CM | POA: Diagnosis not present

## 2024-07-03 DIAGNOSIS — Z23 Encounter for immunization: Secondary | ICD-10-CM | POA: Diagnosis not present

## 2024-07-06 ENCOUNTER — Telehealth: Payer: Self-pay

## 2024-07-06 ENCOUNTER — Encounter: Payer: Self-pay | Admitting: Student in an Organized Health Care Education/Training Program

## 2024-07-06 ENCOUNTER — Ambulatory Visit (INDEPENDENT_AMBULATORY_CARE_PROVIDER_SITE_OTHER): Admitting: Student in an Organized Health Care Education/Training Program

## 2024-07-06 VITALS — BP 138/99 | Ht 70.5 in | Wt 218.0 lb

## 2024-07-06 DIAGNOSIS — N492 Inflammatory disorders of scrotum: Secondary | ICD-10-CM

## 2024-07-06 DIAGNOSIS — R7303 Prediabetes: Secondary | ICD-10-CM | POA: Diagnosis not present

## 2024-07-06 DIAGNOSIS — M1A9XX1 Chronic gout, unspecified, with tophus (tophi): Secondary | ICD-10-CM

## 2024-07-06 DIAGNOSIS — Z992 Dependence on renal dialysis: Secondary | ICD-10-CM

## 2024-07-06 DIAGNOSIS — I10 Essential (primary) hypertension: Secondary | ICD-10-CM

## 2024-07-06 DIAGNOSIS — N186 End stage renal disease: Secondary | ICD-10-CM

## 2024-07-06 DIAGNOSIS — L089 Local infection of the skin and subcutaneous tissue, unspecified: Secondary | ICD-10-CM | POA: Insufficient documentation

## 2024-07-06 DIAGNOSIS — I428 Other cardiomyopathies: Secondary | ICD-10-CM

## 2024-07-06 LAB — CBC WITH DIFFERENTIAL/PLATELET
Basophils Absolute: 0 K/uL (ref 0.0–0.1)
Basophils Relative: 0.5 % (ref 0.0–3.0)
Eosinophils Absolute: 0 K/uL (ref 0.0–0.7)
Eosinophils Relative: 0.6 % (ref 0.0–5.0)
HCT: 33.5 % — ABNORMAL LOW (ref 39.0–52.0)
Hemoglobin: 11 g/dL — ABNORMAL LOW (ref 13.0–17.0)
Lymphocytes Relative: 30.2 % (ref 12.0–46.0)
Lymphs Abs: 2.7 K/uL (ref 0.7–4.0)
MCHC: 32.7 g/dL (ref 30.0–36.0)
MCV: 90.5 fl (ref 78.0–100.0)
Monocytes Absolute: 0.4 K/uL (ref 0.1–1.0)
Monocytes Relative: 4.4 % (ref 3.0–12.0)
Neutro Abs: 5.7 K/uL (ref 1.4–7.7)
Neutrophils Relative %: 64.3 % (ref 43.0–77.0)
Platelets: 250 K/uL (ref 150.0–400.0)
RBC: 3.71 Mil/uL — ABNORMAL LOW (ref 4.22–5.81)
RDW: 14.9 % (ref 11.5–15.5)
WBC: 8.8 K/uL (ref 4.0–10.5)

## 2024-07-06 LAB — HEMOGLOBIN A1C: Hgb A1c MFr Bld: 5.7 % (ref 4.6–6.5)

## 2024-07-06 LAB — URIC ACID: Uric Acid, Serum: 9 mg/dL — ABNORMAL HIGH (ref 4.0–7.8)

## 2024-07-06 NOTE — Assessment & Plan Note (Signed)
 End-stage renal disease for 1 year on hemodialysis through a tunneled IJ central line.  Patient tells me that he had no good targets for fistula placement.  He gets dialysis on Tuesday, Thursday, Saturday with [facility] in [place].  He reports missing a few sessions here and there especially when he is feeling poorly, has issues with recurrent abscess.  He has suspicions that the dialysis is contributing to his recurrent infections.  He is hopeful of doing a transplant at some point in the future but currently is not listed.  He still makes urine throughout the day.

## 2024-07-06 NOTE — Progress Notes (Signed)
 New Patient Office Visit  Subjective    Patient ID: YUSSEF JORGE, male    DOB: 1986/06/21  Age: 38 y.o. MRN: [id number]  CC:  Chief Complaint  Patient presents with   Establish Care    Patient on Dialysis and have abscesses     HPI  Discussed the use of AI scribe software for clinical note transcription with the patient, who gave verbal consent to proceed.  History of Present Illness Herberth Deharo Keng is a 38 year old male with recurrent scrotal abscesses who presents for evaluation and management of these abscesses.  He has experienced recurrent scrotal abscesses over the past year, with the first occurrence in June 2024. The abscesses are painful, become large, and often require drainage. They frequently recur in the same location on his scrotum, with the most recent one occurring last Saturday. He is currently taking clindamycin , prescribed as 28 capsules to be taken four times a day, to manage these abscesses.  He has been on dialysis for about a year due to kidney failure, which he attributes to high blood pressure. He attends dialysis sessions three times a week and reports increased urination since starting dialysis, with no fluid retention. He has a catheter for dialysis access, which has been in place for a year. He is interested in pursuing a kidney transplant but has been removed from the evaluation list due to missed treatments.  He has a history of congestive heart failure, which is being managed with Entresto  and carvedilol . He reports improvement in his heart condition with medication.  He has a history of a stroke, which he attributes to high blood pressure and a series of mini-strokes prior to the major event. He also experiences gout attacks, which he manages with prednisone  on a PRN basis, particularly when consuming foods that trigger his gout.  He experiences skin issues, including dryness and nodules on his scalp and face, which he associates with his kidney condition.  He uses antibacterial soaps and Hibiclens  to manage these skin issues.  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 He is active in his church and has recently moved back to [place] to care for his family. No fevers or chills. No history of diabetes.   Outpatient Encounter Medications as of 07/06/2024  Medication Sig   aspirin  EC (ASPIRIN  LOW DOSE) 81 MG tablet Take 1 tablet (81 mg total) by mouth daily.   atorvastatin  (LIPITOR) 20 MG tablet Take 1 tablet (20 mg total) by mouth daily.   calcium  acetate (PHOSLO) 667 MG capsule Take 2,001 mg by mouth 3 (three) times daily with meals.   carvedilol  (COREG ) 25 MG tablet Take 1 tablet (25 mg total) by mouth 2 (two) times daily.   clindamycin  (CLEOCIN ) 150 MG capsule Take 1 capsule (150 mg total) by mouth every 6 (six) hours.   famotidine  (PEPCID ) 40 MG tablet Take 40 mg by mouth as needed for heartburn or indigestion.   isosorbide  mononitrate (IMDUR ) 30 MG 24 hr tablet Take 1 tablet (30 mg total) by mouth daily.   pantoprazole  (PROTONIX ) 40 MG tablet Take 1 tablet (40 mg total) by mouth daily.   predniSONE  (DELTASONE ) 20 MG tablet Take 2 tablets (40 mg total) by mouth daily for up to 5 days during gout flares.   [DISCONTINUED] acetaminophen  (TYLENOL ) 325 MG tablet Take 2 tablets (650 mg total) by mouth every 4 (four) hours as needed for  headache or mild pain.   [DISCONTINUED] carvedilol  (COREG ) 6.25 MG tablet Take 1 tablet (6.25 mg total) by mouth 2 (two) times daily.   [DISCONTINUED] clindamycin  (CLINDAGEL) 1 % gel Apply topically 2 (two) times daily.   [DISCONTINUED] LOKELMA  10 g PACK packet Take 10 g by mouth See admin instructions. Take on days Dialysis is missed.   [DISCONTINUED] ondansetron  (ZOFRAN -ODT) 4 MG disintegrating tablet Take 1 tablet (4 mg total) by mouth every 8 (eight) hours as needed for nausea or vomiting.   sacubitril -valsartan  (ENTRESTO ) 24-26 MG Take  1 tablet by mouth 2 (two) times daily.   [DISCONTINUED] cefTAZidime  1 g in sodium chloride  0.9 % 100 mL Inject 1 g into the vein Every Tuesday,Thursday,and Saturday with dialysis.   [DISCONTINUED] furosemide  (LASIX ) 80 MG tablet Take 80 mg by mouth daily. Take 1 tablet by mouth on Monday,Wednesday,Friday and Sunday.   [DISCONTINUED] hydrALAZINE  (APRESOLINE ) 25 MG tablet Take 1 tablet (25 mg total) by mouth 3 (three) times daily.   [DISCONTINUED] vancomycin  (VANCOCIN ) 1-5 GM/200ML-% SOLN Inject 200 mLs (1,000 mg total) into the vein Every Tuesday,Thursday,and Saturday with dialysis.   No facility-administered encounter medications on file as of 07/06/2024.    Past Medical History:  Diagnosis Date   Arthritis    CKD stage 4 secondary to hypertension (HCC) 03/28/2017   Elevated blood uric acid level    Essential hypertension    Folliculitis    Managed with doxycycline , topical clindamycin /benzoyl peroxide   GERD (gastroesophageal reflux disease)    Gout    HFrEF (heart failure with reduced ejection fraction) (HCC)    Echo 2019 with EF 30-35%, hypokinesis   History of cardiomyopathy    LVEF 15% in 2010 - evaluated by [facility] and Dr. Fernande   History of stroke 2010   Right MCA distribution infarct, felt to be possibly embolic in the setting of cardiomyopathy - placed on Coumadin at that time   Hyperlipidemia    Nonischemic cardiomyopathy (HCC) 03/28/2017   Obesity    Prediabetes 05/03/2017    Past Surgical History:  Procedure Laterality Date   IR FLUORO GUIDE CV LINE RIGHT  05/17/2023   IR FLUORO GUIDE CV LINE RIGHT  06/01/2023   IR US  GUIDE VASC ACCESS RIGHT  05/17/2023   RIGHT/LEFT HEART CATH AND CORONARY ANGIOGRAPHY N/A 05/18/2023   Procedure: RIGHT/LEFT HEART CATH AND CORONARY ANGIOGRAPHY;  Surgeon: Elmira Newman PARAS, MD;  Location: [facility] INVASIVE CV LAB;  Service: Cardiovascular;  Laterality: N/A;    Family History  Problem Relation Age of Onset   Stroke Cousin    Hypertension  Mother    Heart attack Mother        Died age 36   Hypertension Father          Objective    BP (!) 138/99   Ht 5' 10.5 (1.791 m)   Wt 218 lb (98.9 kg)   BMI 30.84 kg/m   Physical Exam  Gen: Well-appearing young man Face: Skin of his face has multiple scars from prior acne, he has discoloration along the beard line consistent with tinea barbae, a few subcutaneous nodules along the scalp with some overlying scale Neck: Normal thyroid, no nodules or adenopathy Heart: Regular, no murmur Lungs unlabored, clear throughout:  Abd: Soft, nontender Scrotum: Moderate areas of induration on the left side of the scrotum, some drainage, no purulence, no tenderness to palpation, testicles feel normal, no skin breakdown, ulcerations, or open wounds Ext: Warm, no edema Neuro: Alert, conversational, full strength  upper and lower extremities, normal gait and balance Psych: Idiosyncratic, not anxious or depressed appearing    Assessment & Plan:   Problem List Items Addressed This Visit       High   Nonischemic cardiomyopathy (HCC) (Chronic)   Chronic heart failure with mildly reduced ejection fraction, last echo was 40-45% LV.  Etiology is nonischemic, most likely due to longstanding hypertension.  This is managed with the heart failure clinic.  He appears well compensated today on exam.  He looks euvolemic, volume status being managed with HD and he still has urine output.  He reports doing well on Entresto , Imdur , and carvedilol .  He was not able to tolerate hydralazine  and stopped taking that medication a few months ago.      Scrotal abscess (Chronic)   Patient with 5th episode of scrotal abscess over the last 12 months.  He has several comorbidities that may be predisposing him to recurrent MRSA infections, he is on hemodialysis, frequent healthcare facility exposure, and uses prednisone  intermittently for episodes of gout.  He is most concerned about the possibility of a Staph aureus  bloodstream infection causing satellites abscess.  This is possible, he is at risk for bloodstream infection because of a indwelling tunneled HD catheter present for the last 1 year.  He has no systemic symptoms of fever.  Will order a blood culture.  I wonder about other mimickers of abscess like a infected inclusion cyst of the scrotum, especially because this drainage seems to come from the same anatomic location on the scrotum.  He does no hair removal, has no local trauma to the area.  I will refer him to urology for examination once this current flare is improved.  I think if he has a chronic inclusion cyst, surgical removal would help alleviate these recurrent infections.      Relevant Orders   Ambulatory referral to Urology   Blood culture (routine single)   CBC with Differential/Platelet   End-stage renal disease on hemodialysis (HCC) (Chronic)   End-stage renal disease for 1 year on hemodialysis through a tunneled IJ central line.  Patient tells me that he had no good targets for fistula placement.  He gets dialysis on Tuesday, Thursday, Saturday with [facility] in [place].  He reports missing a few sessions here and there especially when he is feeling poorly, has issues with recurrent abscess.  He has suspicions that the dialysis is contributing to his recurrent infections.  He is hopeful of doing a transplant at some point in the future but currently is not listed.  He still makes urine throughout the day.      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  He seems to have tinea barbae on his face, history of scarring acne, having some nodules along his scalp that looks somewhat psoriatic.  This is all been worsening since starting HD over the last 1 year.  He has a history of gout that is not treated with uric acid lowering anymore, so I wonder if these could be  tophi.  Will refer the patient to dermatology to consult on the cause of these frequent subcutaneous nodules along his face, beard line, and scalp.      Relevant Orders   Ambulatory referral to Dermatology     Medium    Prediabetes (Chronic)   Relevant Orders   Hemoglobin  A1c   Essential hypertension (Chronic)   Very chronic issue causing most of his comorbidities including ESRD and heart disease.  Blood pressure is reasonable today.  Tolerating Entresto , Imdur , and carvedilol .  Was not able to tolerate hydralazine , said it made him feel woozy.  So we stopped that medicine a few months ago.  Glad to see his blood pressure is a little better currently.        Low   Gout (Chronic)   Chronic and stable.  Reports a long history of gout, previously using allopurinol  but discontinued when his CKD progressed.  Now he reports using as needed prednisone .  He has a number of refills over the last year.  Says he uses this when he goes out to eat sometimes.  30 tablets of 20 mg prednisone  usually lasts him about 2 months.  Says he uses this mostly for preventative when he feels like a gout attack is about to start.  I do worry some of the nodules especially on his face may be tophi, will check uric acid today.      Relevant Orders   Uric acid    Return in about 3 months (around 10/06/2024).   Cleatus Debby Specking, MD

## 2024-07-06 NOTE — Assessment & Plan Note (Addendum)
 Chronic heart failure with mildly reduced ejection fraction, last echo was 40-45% LV.  Etiology is nonischemic, most likely due to longstanding hypertension.  This is managed with the heart failure clinic.  He appears well compensated today on exam.  He looks euvolemic, volume status being managed with HD and he still has urine output.  He reports doing well on Entresto , Imdur , and carvedilol .  He was not able to tolerate hydralazine  and stopped taking that medication a few months ago.

## 2024-07-06 NOTE — Patient Instructions (Signed)
  VISIT SUMMARY: Today, we discussed your recurrent scrotal abscesses, kidney failure management, heart condition, high blood pressure, and gout. We reviewed your current medications and made some recommendations for further evaluation and treatment.  YOUR PLAN: -RECURRENT SCROTAL ABSCESSES: You have been experiencing painful, recurring abscesses on your scrotum. These may be due to a condition called hidradenitis suppurativa or possibly cysts. We will continue your current antibiotic, clindamycin , and have ordered blood cultures. You will also be referred to a dermatologist and a urologist for further evaluation.  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 We discussed the importance of attending all dialysis sessions to be reconsidered for a kidney transplant. We will also talk to the dialysis team about possibly placing a fistula or another type of access.  -CONGESTIVE HEART FAILURE WITH REDUCED EJECTION FRACTION: Your heart condition, which means your heart doesn't pump blood as well as it should, is being managed with Entresto  and carvedilol . You have shown improvement, and we will continue with these medications.  -HYPERTENSION: Your high blood pressure, which contributes to your heart and kidney issues, is being managed with carvedilol  and Entresto . We will continue with this treatment plan.  -GOUT: Your gout, which causes painful joint inflammation, is managed with prednisone  during flare-ups. We will continue to use prednisone  as needed and monitor for any side effects, including an increased risk of infection.  INSTRUCTIONS: Please follow up with the dermatologist and urologist as soon as possible for your scrotal abscesses. Continue attending all your dialysis sessions and discuss the possibility of a fistula or alternative access with your dialysis team. Keep taking your medications as prescribed and  monitor for any side effects. If you experience any new or worsening symptoms, please contact our office.

## 2024-07-06 NOTE — Assessment & Plan Note (Signed)
 Very chronic issue causing most of his comorbidities including ESRD and heart disease.  Blood pressure is reasonable today.  Tolerating Entresto , Imdur , and carvedilol .  Was not able to tolerate hydralazine , said it made him feel woozy.  So we stopped that medicine a few months ago.  Glad to see his blood pressure is a little better currently.

## 2024-07-06 NOTE — Assessment & Plan Note (Signed)
 Chronic and stable.  Reports a long history of gout, previously using allopurinol  but discontinued when his CKD progressed.  Now he reports using as needed prednisone .  He has a number of refills over the last year.  Says he uses this when he goes out to eat sometimes.  30 tablets of 20 mg prednisone  usually lasts him about 2 months.  Says he uses this mostly for preventative when he feels like a gout attack is about to start.  I do worry some of the nodules especially on his face may be tophi, will check uric acid today.

## 2024-07-06 NOTE — Assessment & Plan Note (Signed)
 Recurrent skin infections mostly impacting his scrotum but he has a number of other nodules on his skin that cause him concern.  He seems to have tinea barbae on his face, history of scarring acne, having some nodules along his scalp that looks somewhat psoriatic.  This is all been worsening since starting HD over the last 1 year.  He has a history of gout that is not treated with uric acid lowering anymore, so I wonder if these could be tophi.  Will refer the patient to dermatology to consult on the cause of these frequent subcutaneous nodules along his face, beard line, and scalp.

## 2024-07-06 NOTE — Telephone Encounter (Signed)
 Called patient to let him know that the blood culture lab that Dr.Vincent ordered is not able to be collected at our lab. He will need to go to [place] lab to have this completed. Left VM, for patient to return call.

## 2024-07-06 NOTE — Assessment & Plan Note (Signed)
 Patient with 5th episode of scrotal abscess over the last 12 months.  He has several comorbidities that may be predisposing him to recurrent MRSA infections, he is on hemodialysis, frequent healthcare facility exposure, and uses prednisone  intermittently for episodes of gout.  He is most concerned about the possibility of a Staph aureus bloodstream infection causing satellites abscess.  This is possible, he is at risk for bloodstream infection because of a indwelling tunneled HD catheter present for the last 1 year.  He has no systemic symptoms of fever.  Will order a blood culture.  I wonder about other mimickers of abscess like a infected inclusion cyst of the scrotum, especially because this drainage seems to come from the same anatomic location on the scrotum.  He does no hair removal, has no local trauma to the area.  I will refer him to urology for examination once this current flare is improved.  I think if he has a chronic inclusion cyst, surgical removal would help alleviate these recurrent infections.

## 2024-07-07 DIAGNOSIS — Z23 Encounter for immunization: Secondary | ICD-10-CM | POA: Diagnosis not present

## 2024-07-07 DIAGNOSIS — Z992 Dependence on renal dialysis: Secondary | ICD-10-CM | POA: Diagnosis not present

## 2024-07-07 DIAGNOSIS — N186 End stage renal disease: Secondary | ICD-10-CM | POA: Diagnosis not present

## 2024-07-10 ENCOUNTER — Ambulatory Visit: Payer: Self-pay | Admitting: Student in an Organized Health Care Education/Training Program

## 2024-07-10 DIAGNOSIS — Z992 Dependence on renal dialysis: Secondary | ICD-10-CM | POA: Diagnosis not present

## 2024-07-10 DIAGNOSIS — Z23 Encounter for immunization: Secondary | ICD-10-CM | POA: Diagnosis not present

## 2024-07-10 DIAGNOSIS — N186 End stage renal disease: Secondary | ICD-10-CM | POA: Diagnosis not present

## 2024-07-10 NOTE — Telephone Encounter (Signed)
 Called patient to check in if he went to [facility] to complete the Blood culture labs.Left Vm to return call

## 2024-07-11 ENCOUNTER — Other Ambulatory Visit: Payer: Self-pay

## 2024-07-11 ENCOUNTER — Other Ambulatory Visit (HOSPITAL_COMMUNITY): Payer: Self-pay

## 2024-07-11 ENCOUNTER — Other Ambulatory Visit

## 2024-07-11 DIAGNOSIS — N492 Inflammatory disorders of scrotum: Secondary | ICD-10-CM

## 2024-07-11 MED ORDER — PREDNISONE 20 MG PO TABS
40.0000 mg | ORAL_TABLET | Freq: Every day | ORAL | 0 refills | Status: DC
  Filled 2024-07-11: qty 10, 5d supply, fill #0

## 2024-07-11 NOTE — Telephone Encounter (Unsigned)
 Copied from CRM [id number]. Topic: Clinical - Lab/Test Results >> Jul 11, 2024 12:27 PM Zachary Lynch ORN wrote: Reason for CRM: pt called to update on labs. Pt stated that [name] lab told him that they didn't have blood culture there and couldn't complete them and told him to go to [facility] but he didn't have his id and will try again tomorrow.   Pt mentioned doing blood culture last week with another clinic and giving permission to share information.

## 2024-07-11 NOTE — Telephone Encounter (Signed)
 Future orders placed for Blood culture to have completed at [facility] lab

## 2024-07-11 NOTE — Addendum Note (Signed)
 Addended by: RANDINE BERYL SAUNDERS on: 07/11/2024 09:15 AM   Modules accepted: Orders

## 2024-07-14 DIAGNOSIS — N186 End stage renal disease: Secondary | ICD-10-CM | POA: Diagnosis not present

## 2024-07-14 DIAGNOSIS — Z23 Encounter for immunization: Secondary | ICD-10-CM | POA: Diagnosis not present

## 2024-07-14 DIAGNOSIS — Z992 Dependence on renal dialysis: Secondary | ICD-10-CM | POA: Diagnosis not present

## 2024-07-17 ENCOUNTER — Telehealth: Admitting: Physician Assistant

## 2024-07-17 DIAGNOSIS — R300 Dysuria: Secondary | ICD-10-CM

## 2024-07-18 NOTE — Progress Notes (Signed)
 E-Visit for Urinary Problems  Based on what you shared with me, I feel your condition warrants further evaluation and I recommend that you be seen for a face to face office visit.  Male bladder infections are not very common.  We worry about prostate or kidney conditions.  The standard of care is to examine the abdomen and kidneys, and to do a urine and blood test to make sure that something more serious is not going on.  We recommend that you see a provider today.  If your doctor's office is closed [place] has the following Urgent Cares:   NOTE: There will be NO CHARGE for this E-Visit   If you are having a true medical emergency, please call 911.     For an urgent face to face visit, [facility] has multiple urgent care centers for your convenience.  Click the link below for the full list of locations and hours, walk-in wait times, appointment scheduling options and driving directions:  Urgent Care - [place], [place], [place], [place], [place], [place]       Your MyChart E-visit questionnaire answers were reviewed by a board certified advanced clinical practitioner to complete your personal care plan based on your specific symptoms.  Thank you for using e-Visits.

## 2024-07-19 ENCOUNTER — Encounter (HOSPITAL_COMMUNITY): Payer: Self-pay

## 2024-07-19 ENCOUNTER — Other Ambulatory Visit (HOSPITAL_COMMUNITY): Payer: Self-pay

## 2024-07-19 ENCOUNTER — Telehealth (HOSPITAL_COMMUNITY): Payer: Self-pay | Admitting: Cardiology

## 2024-07-19 DIAGNOSIS — Z992 Dependence on renal dialysis: Secondary | ICD-10-CM | POA: Diagnosis not present

## 2024-07-19 DIAGNOSIS — Z23 Encounter for immunization: Secondary | ICD-10-CM | POA: Diagnosis not present

## 2024-07-19 DIAGNOSIS — N186 End stage renal disease: Secondary | ICD-10-CM | POA: Diagnosis not present

## 2024-07-19 MED ORDER — CIPROFLOXACIN HCL 500 MG PO TABS
500.0000 mg | ORAL_TABLET | Freq: Every day | ORAL | 0 refills | Status: DC
  Filled 2024-07-19 (×2): qty 5, 5d supply, fill #0

## 2024-07-19 NOTE — Telephone Encounter (Signed)
 Called to confirm/remind patient of their appointment at the [facility] on 07/19/24.   Appointment:   [] Confirmed  [x] Left mess   [] No answer/No voice mail  [] VM Full/unable to leave message  [] Phone not in service  Patient reminded to bring all medications and/or complete list.  Confirmed patient has transportation. Gave directions, instructed to utilize valet parking.

## 2024-07-20 ENCOUNTER — Encounter (HOSPITAL_COMMUNITY): Payer: Self-pay

## 2024-07-20 ENCOUNTER — Ambulatory Visit (HOSPITAL_COMMUNITY): Admitting: Cardiology

## 2024-07-20 NOTE — Progress Notes (Incomplete)
   ADVANCED HEART FAILURE FOLLOW UP CLINIC NOTE  Referring Physician: Jerrell Cleatus Ned,*  Primary Care: Jerrell Cleatus Ned, MD Primary Cardiologist:  HPI: Zachary Lynch is a 38 y.o. male with a PMH of  severe HTN, CKD IV--> now ESRD,  gout, obesity and systolic HF due to NICM ho presents for follow up of chronic systolic heart failure.      NICM that was diagnosed when he had a stroke in 2010. Initially followed Dr. Leafy at [facility], transiently wore a LifeVest and then saw Dr. Debera in 2016. Has not seen a cardiologist since. Referred back to [facility] Clinic by Severa Mink PA-C for follow-up of his HF in 2019.    Echo (2010): EF 15%.    Echo (2016): EF 25%.    Initial visit Dr. [name] 05/2018. GDMT titrated. Echo (9/19): EF 30-35%. Lost to follow up due to financial concerns.   Admitted 8/24 with a/c HF, off all meds x 3 months due to finances. HsTroponins > 5k (no CP, in setting of CKD IV) and creatinine 21.8. Had not seen Nephrology since 2022. ECG showed ST with subtle TWI. Echo showed EF 25%, moderate eccentric LVH, RV hyperdynamic. Underwent TDC placement, received dialysis on 05/17/23, and then underwent R/LHC showing mildly elevated filling pressures, borderline pulmonary hypertension, and no obstructive CAD. He continued to make some urine and was continued on Entresto . Hospitalization c/b scrotal abscess, general surgery consulted and recommended supportive care. Outpatient iHD arranged and he was discharged home, weight 220 lbs.  Family Hx: Mother with MI (died at 83), HTN, father with HTN     SUBJECTIVE:  PMH, current medications, allergies, social history, and family history reviewed in epic.  PHYSICAL EXAM: There were no vitals filed for this visit. GENERAL: Well nourished and in no apparent distress at rest.  PULM:  Normal work of breathing, clear to auscultation bilaterally. Respirations are unlabored.  CARDIAC:  JVP: ***         Normal rate with regular  rhythm. No murmurs, rubs or gallops.  *** edema. Warm and well perfused extremities. ABDOMEN: Soft, non-tender, non-distended. NEUROLOGIC: Patient is oriented x3 with no focal or lateralizing neurologic deficits.    DATA REVIEW  ECG: ***    ECHO: ***   CATH: ***   Heart failure review: - Classification: {HFCLASS:30917} - Etiology: {Cardiomyopathy:30918} - NYHA Class:  - Volume status: {volumechf:30919} - ACEi/ARB/ARNI: {HF:30752} - Aldosterone antagonist: {HF:30752} - Beta-blocker: {HF:30752} - Digoxin: {HF:30752} - Hydralazine /Nitrates: {HF:30752} - SGLT2i: {HF:30752} - GLP-1: {GLP:30906} - Advanced therapies: {Advancedtherapies:30916} - ICD: {ICD:30901}  ASSESSMENT & PLAN:  ***  Follow up in ***  Morene Brownie, MD Advanced Heart Failure Mechanical Circulatory Support 07/20/24

## 2024-07-25 DIAGNOSIS — Z992 Dependence on renal dialysis: Secondary | ICD-10-CM | POA: Diagnosis not present

## 2024-07-25 DIAGNOSIS — L02214 Cutaneous abscess of groin: Secondary | ICD-10-CM | POA: Diagnosis not present

## 2024-07-25 DIAGNOSIS — N186 End stage renal disease: Secondary | ICD-10-CM | POA: Diagnosis not present

## 2024-07-31 DIAGNOSIS — N186 End stage renal disease: Secondary | ICD-10-CM | POA: Diagnosis not present

## 2024-07-31 DIAGNOSIS — Z992 Dependence on renal dialysis: Secondary | ICD-10-CM | POA: Diagnosis not present

## 2024-07-31 DIAGNOSIS — L02214 Cutaneous abscess of groin: Secondary | ICD-10-CM | POA: Diagnosis not present

## 2024-08-04 DIAGNOSIS — N186 End stage renal disease: Secondary | ICD-10-CM | POA: Diagnosis not present

## 2024-08-04 DIAGNOSIS — Z992 Dependence on renal dialysis: Secondary | ICD-10-CM | POA: Diagnosis not present

## 2024-08-04 DIAGNOSIS — L02214 Cutaneous abscess of groin: Secondary | ICD-10-CM | POA: Diagnosis not present

## 2024-08-06 ENCOUNTER — Emergency Department (HOSPITAL_BASED_OUTPATIENT_CLINIC_OR_DEPARTMENT_OTHER)

## 2024-08-06 ENCOUNTER — Inpatient Hospital Stay (HOSPITAL_BASED_OUTPATIENT_CLINIC_OR_DEPARTMENT_OTHER)
Admission: EM | Admit: 2024-08-06 | Discharge: 2024-08-10 | DRG: 689 | Disposition: A | Discharge status: Home / Self Care | Attending: Internal Medicine | Admitting: Internal Medicine

## 2024-08-06 ENCOUNTER — Other Ambulatory Visit: Payer: Self-pay

## 2024-08-06 DIAGNOSIS — N200 Calculus of kidney: Secondary | ICD-10-CM | POA: Diagnosis not present

## 2024-08-06 DIAGNOSIS — K219 Gastro-esophageal reflux disease without esophagitis: Secondary | ICD-10-CM | POA: Diagnosis present

## 2024-08-06 DIAGNOSIS — K5730 Diverticulosis of large intestine without perforation or abscess without bleeding: Secondary | ICD-10-CM | POA: Diagnosis not present

## 2024-08-06 DIAGNOSIS — Z888 Allergy status to other drugs, medicaments and biological substances status: Secondary | ICD-10-CM | POA: Diagnosis not present

## 2024-08-06 DIAGNOSIS — N12 Tubulo-interstitial nephritis, not specified as acute or chronic: Principal | ICD-10-CM | POA: Diagnosis present

## 2024-08-06 DIAGNOSIS — N10 Acute tubulo-interstitial nephritis: Secondary | ICD-10-CM | POA: Diagnosis not present

## 2024-08-06 DIAGNOSIS — B9620 Unspecified Escherichia coli [E. coli] as the cause of diseases classified elsewhere: Secondary | ICD-10-CM | POA: Diagnosis present

## 2024-08-06 DIAGNOSIS — E8809 Other disorders of plasma-protein metabolism, not elsewhere classified: Secondary | ICD-10-CM | POA: Diagnosis present

## 2024-08-06 DIAGNOSIS — E785 Hyperlipidemia, unspecified: Secondary | ICD-10-CM | POA: Diagnosis present

## 2024-08-06 DIAGNOSIS — E66811 Obesity, class 1: Secondary | ICD-10-CM | POA: Diagnosis present

## 2024-08-06 DIAGNOSIS — N281 Cyst of kidney, acquired: Secondary | ICD-10-CM | POA: Diagnosis not present

## 2024-08-06 DIAGNOSIS — N2889 Other specified disorders of kidney and ureter: Secondary | ICD-10-CM | POA: Diagnosis present

## 2024-08-06 DIAGNOSIS — Z79899 Other long term (current) drug therapy: Secondary | ICD-10-CM

## 2024-08-06 DIAGNOSIS — Z8673 Personal history of transient ischemic attack (TIA), and cerebral infarction without residual deficits: Secondary | ICD-10-CM | POA: Diagnosis not present

## 2024-08-06 DIAGNOSIS — Z1612 Extended spectrum beta lactamase (ESBL) resistance: Secondary | ICD-10-CM | POA: Diagnosis present

## 2024-08-06 DIAGNOSIS — L45 Papulosquamous disorders in diseases classified elsewhere: Secondary | ICD-10-CM | POA: Diagnosis not present

## 2024-08-06 DIAGNOSIS — N289 Disorder of kidney and ureter, unspecified: Secondary | ICD-10-CM

## 2024-08-06 DIAGNOSIS — E669 Obesity, unspecified: Secondary | ICD-10-CM | POA: Diagnosis not present

## 2024-08-06 DIAGNOSIS — Z992 Dependence on renal dialysis: Secondary | ICD-10-CM | POA: Diagnosis not present

## 2024-08-06 DIAGNOSIS — I5022 Chronic systolic (congestive) heart failure: Secondary | ICD-10-CM | POA: Diagnosis present

## 2024-08-06 DIAGNOSIS — M109 Gout, unspecified: Secondary | ICD-10-CM | POA: Diagnosis present

## 2024-08-06 DIAGNOSIS — I132 Hypertensive heart and chronic kidney disease with heart failure and with stage 5 chronic kidney disease, or end stage renal disease: Secondary | ICD-10-CM | POA: Diagnosis present

## 2024-08-06 DIAGNOSIS — Z823 Family history of stroke: Secondary | ICD-10-CM | POA: Diagnosis not present

## 2024-08-06 DIAGNOSIS — D631 Anemia in chronic kidney disease: Secondary | ICD-10-CM | POA: Diagnosis present

## 2024-08-06 DIAGNOSIS — Z8249 Family history of ischemic heart disease and other diseases of the circulatory system: Secondary | ICD-10-CM | POA: Diagnosis not present

## 2024-08-06 DIAGNOSIS — N186 End stage renal disease: Secondary | ICD-10-CM | POA: Diagnosis present

## 2024-08-06 DIAGNOSIS — Z7982 Long term (current) use of aspirin: Secondary | ICD-10-CM | POA: Diagnosis not present

## 2024-08-06 DIAGNOSIS — Z6830 Body mass index (BMI) 30.0-30.9, adult: Secondary | ICD-10-CM | POA: Diagnosis not present

## 2024-08-06 DIAGNOSIS — I428 Other cardiomyopathies: Secondary | ICD-10-CM | POA: Diagnosis present

## 2024-08-06 DIAGNOSIS — N492 Inflammatory disorders of scrotum: Secondary | ICD-10-CM | POA: Diagnosis not present

## 2024-08-06 LAB — URINALYSIS, ROUTINE W REFLEX MICROSCOPIC
Bacteria, UA: NONE SEEN
RBC / HPF: >50 RBC/hpf (ref 0–5)
WBC, UA: >50 WBC/hpf (ref 0–5)

## 2024-08-06 LAB — CBC
HCT: 28.9 % — ABNORMAL LOW (ref 39.0–52.0)
Hemoglobin: 9.6 g/dL — ABNORMAL LOW (ref 13.0–17.0)
MCH: 29.3 pg (ref 26.0–34.0)
MCHC: 33.2 g/dL (ref 30.0–36.0)
MCV: 88.1 fL (ref 80.0–100.0)
Platelets: 244 K/uL (ref 150–400)
RBC: 3.28 MIL/uL — ABNORMAL LOW (ref 4.22–5.81)
RDW: 14.2 % (ref 11.5–15.5)
WBC: 8.3 K/uL (ref 4.0–10.5)
nRBC: 0 % (ref 0.0–0.2)

## 2024-08-06 LAB — URINALYSIS, MICROSCOPIC (REFLEX)
Bacteria, UA: NONE SEEN
RBC / HPF: >50 RBC/hpf (ref 0–5)
Squamous Epithelial / HPF: NONE SEEN /HPF (ref 0–5)
WBC, UA: >50 WBC/hpf (ref 0–5)

## 2024-08-06 LAB — BASIC METABOLIC PANEL WITH GFR
Anion gap: 19 — ABNORMAL HIGH (ref 5–15)
BUN: 58 mg/dL — ABNORMAL HIGH (ref 6–20)
CO2: 24 mmol/L (ref 22–32)
Calcium: 9.7 mg/dL (ref 8.9–10.3)
Chloride: 97 mmol/L — ABNORMAL LOW (ref 98–111)
Creatinine, Ser: 18.2 mg/dL — ABNORMAL HIGH (ref 0.61–1.24)
GFR, Estimated: 3 mL/min — ABNORMAL LOW (ref >60)
Glucose, Bld: 134 mg/dL — ABNORMAL HIGH (ref 70–99)
Potassium: 3.5 mmol/L (ref 3.5–5.1)
Sodium: 140 mmol/L (ref 135–145)

## 2024-08-06 MED ORDER — IOHEXOL 300 MG/ML  SOLN
100.0000 mL | Freq: Once | INTRAMUSCULAR | Status: AC | PRN
  Administered 2024-08-06: 85 mL via INTRAVENOUS

## 2024-08-06 MED ORDER — MORPHINE SULFATE (PF) 4 MG/ML IV SOLN
4.0000 mg | Freq: Once | INTRAVENOUS | Status: AC
  Administered 2024-08-07: 4 mg via INTRAVENOUS
  Filled 2024-08-06: qty 1

## 2024-08-06 MED ORDER — ONDANSETRON HCL 4 MG/2ML IJ SOLN
4.0000 mg | Freq: Once | INTRAMUSCULAR | Status: AC
  Administered 2024-08-07: 4 mg via INTRAVENOUS
  Filled 2024-08-06: qty 2

## 2024-08-06 MED ORDER — SODIUM CHLORIDE 0.9 % IV SOLN
2.0000 g | Freq: Once | INTRAVENOUS | Status: AC
  Administered 2024-08-07: 2 g via INTRAVENOUS
  Filled 2024-08-06: qty 20

## 2024-08-06 NOTE — ED Triage Notes (Signed)
 Dark urine, sediment, dysuria. Dull left flank pain. Dialysis T, Th,Sat -right sided port.

## 2024-08-06 NOTE — ED Provider Notes (Incomplete)
 [place] EMERGENCY DEPARTMENT AT [facility] Provider Note   CSN: [id number] Arrival date & time: 08/06/24  1635     Patient presents with: No chief complaint on file.   Zachary Lynch is a 38 y.o. male past medical history significant for CHF, ESRD on dialysis, and hypertension presents today for hematuria.  Patient reports dark urine, sediment in his urine, mild nausea and vomiting, and left flank pain.  Patient receives dialysis Tuesday, Thursday, and Saturday last received dialysis on Saturday.  Patient denies dysuria, urgency, frequency, fever, chest pain, shortness of breath, scrotal pain or any other symptoms at this time.  {Add pertinent medical, surgical, social history, OB history to HPI:32947} HPI     Prior to Admission medications   Medication Sig Start Date End Date Taking? Authorizing Provider  aspirin  EC (ASPIRIN  LOW DOSE) 81 MG tablet Take 1 tablet (81 mg total) by mouth daily. 04/18/24   [name], [name], MD  atorvastatin  (LIPITOR) 20 MG tablet Take 1 tablet (20 mg total) by mouth daily. 03/21/24   Glena Harlene HERO, FNP  carvedilol  (COREG ) 25 MG tablet Take 1 tablet (25 mg total) by mouth 2 (two) times daily. 04/24/24     ciprofloxacin  (CIPRO ) 500 MG tablet Take 1 tablet (500 mg total) by mouth daily for 5 days 07/19/24     clindamycin  (CLEOCIN ) 150 MG capsule Take 1 capsule (150 mg total) by mouth every 6 (six) hours. 06/30/24   Dufour, Marry GORMAN, PA-C  famotidine  (PEPCID ) 40 MG tablet Take 40 mg by mouth as needed for heartburn or indigestion. 08/04/23   [provider]  isosorbide  mononitrate (IMDUR ) 30 MG 24 hr tablet Take 1 tablet (30 mg total) by mouth daily. 04/18/24   [name], [name], MD  pantoprazole  (PROTONIX ) 40 MG tablet Take 1 tablet (40 mg total) by mouth daily. 02/24/24   [name], [name], PA-C  predniSONE  (DELTASONE ) 20 MG tablet Take 2 tablets (40 mg total) by mouth daily for up to 5 days during gout flares. 04/24/24     predniSONE   (DELTASONE ) 20 MG tablet Take 2 tablets (40 mg total) by mouth daily for five days as needed for gout flares 07/11/24       Allergies: Albuterol and Tramadol     Review of Systems  Gastrointestinal:  Positive for nausea and vomiting.  Genitourinary:  Positive for flank pain and hematuria.    Updated Vital Signs BP (!) 138/96   Pulse 100   Temp 98.7 F (37.1 C) (Oral)   Resp 17   SpO2 100%   Physical Exam Vitals and nursing note reviewed.  Constitutional:      General: He is not in acute distress.    Appearance: Normal appearance. He is well-developed. He is not toxic-appearing.  HENT:     Head: Normocephalic and atraumatic.     Right Ear: External ear normal.     Left Ear: External ear normal.     Nose: Nose normal.     Mouth/Throat:     Mouth: Mucous membranes are moist.     Pharynx: Oropharynx is clear.  Eyes:     Conjunctiva/sclera: Conjunctivae normal.  Cardiovascular:     Rate and Rhythm: Regular rhythm. Tachycardia present.     Pulses: Normal pulses.     Heart sounds: Normal heart sounds. No murmur heard. Pulmonary:     Effort: Pulmonary effort is normal. No respiratory distress.     Breath sounds: Normal breath sounds.  Abdominal:  Palpations: Abdomen is soft.     Tenderness: There is no abdominal tenderness. There is no right CVA tenderness, left CVA tenderness or guarding.  Musculoskeletal:        General: No swelling.     Cervical back: Neck supple.  Skin:    General: Skin is warm and dry.     Capillary Refill: Capillary refill takes less than 2 seconds.  Neurological:     General: No focal deficit present.     Mental Status: He is alert and oriented to person, place, and time.  Psychiatric:        Mood and Affect: Mood normal.     (all labs ordered are listed, but only abnormal results are displayed) Labs Reviewed  URINALYSIS, ROUTINE W REFLEX MICROSCOPIC - Abnormal; Notable for the following components:      Result Value   Color, Urine BROWN  (*)    APPearance TURBID (*)    Glucose, UA   (*)    Value: TEST NOT REPORTED DUE TO COLOR INTERFERENCE OF URINE PIGMENT   Hgb urine dipstick   (*)    Value: TEST NOT REPORTED DUE TO COLOR INTERFERENCE OF URINE PIGMENT   Bilirubin Urine   (*)    Value: TEST NOT REPORTED DUE TO COLOR INTERFERENCE OF URINE PIGMENT   Ketones, ur   (*)    Value: TEST NOT REPORTED DUE TO COLOR INTERFERENCE OF URINE PIGMENT   Protein, ur   (*)    Value: TEST NOT REPORTED DUE TO COLOR INTERFERENCE OF URINE PIGMENT   Nitrite   (*)    Value: TEST NOT REPORTED DUE TO COLOR INTERFERENCE OF URINE PIGMENT   Leukocytes,Ua   (*)    Value: TEST NOT REPORTED DUE TO COLOR INTERFERENCE OF URINE PIGMENT   All other components within normal limits  BASIC METABOLIC PANEL WITH GFR - Abnormal; Notable for the following components:   Chloride 97 (*)    Glucose, Bld 134 (*)    BUN 58 (*)    Creatinine, Ser 18.20 (*)    GFR, Estimated 3 (*)    Anion gap 19 (*)    All other components within normal limits  CBC - Abnormal; Notable for the following components:   RBC 3.28 (*)    Hemoglobin 9.6 (*)    HCT 28.9 (*)    All other components within normal limits  URINALYSIS, MICROSCOPIC (REFLEX)    EKG: None  Radiology: No results found.  {Document cardiac monitor, telemetry assessment procedure when appropriate:32947} Procedures   Medications Ordered in the ED - No data to display    {Click here for ABCD2, HEART and other calculators REFRESH Note before signing:1}                              Medical Decision Making Amount and/or Complexity of Data Reviewed Labs: ordered.   This patient presents to the ED for concern of hematuria differential diagnosis includes UTI, septic stone, kidney stone, pyelonephritis    Additional history obtained   Additional history obtained from Electronic Medical Record External records from outside source obtained and reviewed including family medicine notes   Lab  Tests:  I Ordered, and personally interpreted labs.  The pertinent results include: Elevated glucose at 134, elevated bun at 58, creatinine 18.2, anion gap 19, anemia 9.6, UA with greater than 50 RBCs, white blood cell clumps, and greater than 50 WBCs Urine culture pending  Imaging Studies ordered:  I ordered imaging studies including CT abdomen pelvis w contrast I independently visualized and interpreted imaging which showed *** I agree with the radiologist interpretation   Medicines ordered and prescription drug management:  I ordered medication including ***    I have reviewed the patients home medicines and have made adjustments as needed   Problem List / ED Course:  ***  {Document critical care time when appropriate  Document review of labs and clinical decision tools ie CHADS2VASC2, etc  Document your independent review of radiology images and any outside records  Document your discussion with family members, caretakers and with consultants  Document social determinants of health affecting pt's care  Document your decision making why or why not admission, treatments were needed:32947:::1}   Final diagnoses:  None    ED Discharge Orders     None

## 2024-08-06 NOTE — ED Provider Notes (Incomplete)
 [name] Provider Note   CSN: [id number] Arrival date & time: 08/06/24  1635     Patient presents with: No chief complaint on file.   Zachary Lynch is a 38 y.o. male past medical history significant for CHF, ESRD on dialysis, and hypertension presents today for hematuria.  Patient reports dark urine, sediment in his urine, mild nausea and vomiting, and left flank pain.  Patient receives dialysis Tuesday, Thursday, and Saturday last received dialysis on Saturday.  Patient denies dysuria, urgency, frequency, fever, chest pain, shortness of breath, scrotal pain or any other symptoms at this time.  {Add pertinent medical, surgical, social history, OB history to HPI:32947} HPI     Prior to Admission medications   Medication Sig Start Date End Date Taking? Authorizing Provider  aspirin  EC (ASPIRIN  LOW DOSE) 81 MG tablet Take 1 tablet (81 mg total) by mouth daily. 04/18/24   [name], [name], MD  atorvastatin  (LIPITOR) 20 MG tablet Take 1 tablet (20 mg total) by mouth daily. 03/21/24   Milford, Harlene HERO, FNP  carvedilol  (COREG ) 25 MG tablet Take 1 tablet (25 mg total) by mouth 2 (two) times daily. 04/24/24     ciprofloxacin  (CIPRO ) 500 MG tablet Take 1 tablet (500 mg total) by mouth daily for 5 days 07/19/24     clindamycin  (CLEOCIN ) 150 MG capsule Take 1 capsule (150 mg total) by mouth every 6 (six) hours. 06/30/24   Dufour, Marry GORMAN, PA-C  famotidine  (PEPCID ) 40 MG tablet Take 40 mg by mouth as needed for heartburn or indigestion. 08/04/23   [provider]  isosorbide  mononitrate (IMDUR ) 30 MG 24 hr tablet Take 1 tablet (30 mg total) by mouth daily. 04/18/24   [name], [name], MD  pantoprazole  (PROTONIX ) 40 MG tablet Take 1 tablet (40 mg total) by mouth daily. 02/24/24   [name], [name], PA-C  predniSONE  (DELTASONE ) 20 MG tablet Take 2 tablets (40 mg total) by mouth daily for up to 5 days during gout flares. 04/24/24     predniSONE   (DELTASONE ) 20 MG tablet Take 2 tablets (40 mg total) by mouth daily for five days as needed for gout flares 07/11/24       Allergies: Albuterol and Tramadol     Review of Systems  Gastrointestinal:  Positive for nausea and vomiting.  Genitourinary:  Positive for flank pain and hematuria.    Updated Vital Signs BP (!) 138/96   Pulse 100   Temp 98.7 F (37.1 C) (Oral)   Resp 17   SpO2 100%   Physical Exam Vitals and nursing note reviewed.  Constitutional:      General: He is not in acute distress.    Appearance: Normal appearance. He is well-developed. He is not toxic-appearing.  HENT:     Head: Normocephalic and atraumatic.     Right Ear: External ear normal.     Left Ear: External ear normal.     Nose: Nose normal.     Mouth/Throat:     Mouth: Mucous membranes are moist.     Pharynx: Oropharynx is clear.  Eyes:     Conjunctiva/sclera: Conjunctivae normal.  Cardiovascular:     Rate and Rhythm: Regular rhythm. Tachycardia present.     Pulses: Normal pulses.     Heart sounds: Normal heart sounds. No murmur heard. Pulmonary:     Effort: Pulmonary effort is normal. No respiratory distress.     Breath sounds: Normal breath sounds.  Abdominal:  Palpations: Abdomen is soft.     Tenderness: There is no abdominal tenderness. There is no right CVA tenderness, left CVA tenderness or guarding.  Musculoskeletal:        General: No swelling.     Cervical back: Neck supple.  Skin:    General: Skin is warm and dry.     Capillary Refill: Capillary refill takes less than 2 seconds.  Neurological:     General: No focal deficit present.     Mental Status: He is alert and oriented to person, place, and time.  Psychiatric:        Mood and Affect: Mood normal.     (all labs ordered are listed, but only abnormal results are displayed) Labs Reviewed  URINALYSIS, ROUTINE W REFLEX MICROSCOPIC - Abnormal; Notable for the following components:      Result Value   Color, Urine BROWN  (*)    APPearance TURBID (*)    Glucose, UA   (*)    Value: TEST NOT REPORTED DUE TO COLOR INTERFERENCE OF URINE PIGMENT   Hgb urine dipstick   (*)    Value: TEST NOT REPORTED DUE TO COLOR INTERFERENCE OF URINE PIGMENT   Bilirubin Urine   (*)    Value: TEST NOT REPORTED DUE TO COLOR INTERFERENCE OF URINE PIGMENT   Ketones, ur   (*)    Value: TEST NOT REPORTED DUE TO COLOR INTERFERENCE OF URINE PIGMENT   Protein, ur   (*)    Value: TEST NOT REPORTED DUE TO COLOR INTERFERENCE OF URINE PIGMENT   Nitrite   (*)    Value: TEST NOT REPORTED DUE TO COLOR INTERFERENCE OF URINE PIGMENT   Leukocytes,Ua   (*)    Value: TEST NOT REPORTED DUE TO COLOR INTERFERENCE OF URINE PIGMENT   All other components within normal limits  BASIC METABOLIC PANEL WITH GFR - Abnormal; Notable for the following components:   Chloride 97 (*)    Glucose, Bld 134 (*)    BUN 58 (*)    Creatinine, Ser 18.20 (*)    GFR, Estimated 3 (*)    Anion gap 19 (*)    All other components within normal limits  CBC - Abnormal; Notable for the following components:   RBC 3.28 (*)    Hemoglobin 9.6 (*)    HCT 28.9 (*)    All other components within normal limits  URINALYSIS, MICROSCOPIC (REFLEX)    EKG: None  Radiology: No results found.  {Document cardiac monitor, telemetry assessment procedure when appropriate:32947} Procedures   Medications Ordered in the ED - No data to display    {Click here for ABCD2, HEART and other calculators REFRESH Note before signing:1}                              Medical Decision Making Amount and/or Complexity of Data Reviewed Labs: ordered. Radiology: ordered.  Risk Prescription drug management.   This patient presents to the ED for concern of hematuria differential diagnosis includes UTI, septic stone, kidney stone, pyelonephritis    Additional history obtained   Additional history obtained from Electronic Medical Record External records from outside source obtained and  reviewed including family medicine notes   Lab Tests:  I Ordered, and personally interpreted labs.  The pertinent results include: Elevated glucose at 134, elevated bun at 58, creatinine 18.2, anion gap 19, anemia 9.6, UA with greater than 50 RBCs, white blood cell clumps, and  greater than 50 WBCs Urine culture pending   Imaging Studies ordered:  I ordered imaging studies including CT abdomen pelvis w contrast I independently visualized and interpreted imaging which showed left perinephric fat stranding worrisome for pyelonephritis.  Expand Sital low-attenuation area in the superior pole of the left kidney has decreased in size compared to 2022.  There is new surrounding wall thickening and mild internal debris which is indeterminate.  Findings may represent a simple cyst or infected cyst.  Recommend further evaluation with ultrasound or MRI.  Bladder wall thickening versus normal underdistention. I agree with the radiologist interpretation   Medicines ordered and prescription drug management:  I ordered medication including Rocephin , morphine , Zofran     I have reviewed the patients home medicines and have made adjustments as needed   Problem List / ED Course:  Consulted hospitalist, Dr. Charlton who is agreeable to admission for pyelonephritis  {Document critical care time when appropriate  Document review of labs and clinical decision tools ie CHADS2VASC2, etc  Document your independent review of radiology images and any outside records  Document your discussion with family members, caretakers and with consultants  Document social determinants of health affecting pt's care  Document your decision making why or why not admission, treatments were needed:32947:::1}   Final diagnoses:  None    ED Discharge Orders     None

## 2024-08-06 NOTE — ED Notes (Signed)
 Patient transported to CT

## 2024-08-07 ENCOUNTER — Inpatient Hospital Stay (HOSPITAL_COMMUNITY)

## 2024-08-07 ENCOUNTER — Encounter (HOSPITAL_COMMUNITY): Payer: Self-pay | Admitting: Family Medicine

## 2024-08-07 DIAGNOSIS — Z823 Family history of stroke: Secondary | ICD-10-CM | POA: Diagnosis not present

## 2024-08-07 DIAGNOSIS — M109 Gout, unspecified: Secondary | ICD-10-CM | POA: Diagnosis present

## 2024-08-07 DIAGNOSIS — L45 Papulosquamous disorders in diseases classified elsewhere: Secondary | ICD-10-CM | POA: Diagnosis not present

## 2024-08-07 DIAGNOSIS — E66811 Obesity, class 1: Secondary | ICD-10-CM | POA: Diagnosis present

## 2024-08-07 DIAGNOSIS — E669 Obesity, unspecified: Secondary | ICD-10-CM | POA: Diagnosis not present

## 2024-08-07 DIAGNOSIS — Z888 Allergy status to other drugs, medicaments and biological substances status: Secondary | ICD-10-CM | POA: Diagnosis not present

## 2024-08-07 DIAGNOSIS — B9620 Unspecified Escherichia coli [E. coli] as the cause of diseases classified elsewhere: Secondary | ICD-10-CM | POA: Diagnosis present

## 2024-08-07 DIAGNOSIS — I132 Hypertensive heart and chronic kidney disease with heart failure and with stage 5 chronic kidney disease, or end stage renal disease: Secondary | ICD-10-CM | POA: Diagnosis present

## 2024-08-07 DIAGNOSIS — K219 Gastro-esophageal reflux disease without esophagitis: Secondary | ICD-10-CM | POA: Diagnosis present

## 2024-08-07 DIAGNOSIS — N492 Inflammatory disorders of scrotum: Secondary | ICD-10-CM | POA: Diagnosis not present

## 2024-08-07 DIAGNOSIS — I5022 Chronic systolic (congestive) heart failure: Secondary | ICD-10-CM | POA: Diagnosis present

## 2024-08-07 DIAGNOSIS — I428 Other cardiomyopathies: Secondary | ICD-10-CM | POA: Diagnosis present

## 2024-08-07 DIAGNOSIS — N281 Cyst of kidney, acquired: Secondary | ICD-10-CM | POA: Diagnosis not present

## 2024-08-07 DIAGNOSIS — N186 End stage renal disease: Secondary | ICD-10-CM | POA: Diagnosis present

## 2024-08-07 DIAGNOSIS — Z8673 Personal history of transient ischemic attack (TIA), and cerebral infarction without residual deficits: Secondary | ICD-10-CM | POA: Diagnosis not present

## 2024-08-07 DIAGNOSIS — Z8249 Family history of ischemic heart disease and other diseases of the circulatory system: Secondary | ICD-10-CM | POA: Diagnosis not present

## 2024-08-07 DIAGNOSIS — Z6830 Body mass index (BMI) 30.0-30.9, adult: Secondary | ICD-10-CM | POA: Diagnosis not present

## 2024-08-07 DIAGNOSIS — N12 Tubulo-interstitial nephritis, not specified as acute or chronic: Secondary | ICD-10-CM | POA: Diagnosis present

## 2024-08-07 DIAGNOSIS — Z992 Dependence on renal dialysis: Secondary | ICD-10-CM | POA: Diagnosis not present

## 2024-08-07 DIAGNOSIS — N289 Disorder of kidney and ureter, unspecified: Secondary | ICD-10-CM | POA: Diagnosis not present

## 2024-08-07 DIAGNOSIS — D631 Anemia in chronic kidney disease: Secondary | ICD-10-CM | POA: Diagnosis present

## 2024-08-07 DIAGNOSIS — N2889 Other specified disorders of kidney and ureter: Secondary | ICD-10-CM | POA: Diagnosis not present

## 2024-08-07 DIAGNOSIS — N10 Acute tubulo-interstitial nephritis: Secondary | ICD-10-CM | POA: Diagnosis not present

## 2024-08-07 DIAGNOSIS — Z1612 Extended spectrum beta lactamase (ESBL) resistance: Secondary | ICD-10-CM | POA: Diagnosis present

## 2024-08-07 DIAGNOSIS — E785 Hyperlipidemia, unspecified: Secondary | ICD-10-CM | POA: Diagnosis present

## 2024-08-07 DIAGNOSIS — E8809 Other disorders of plasma-protein metabolism, not elsewhere classified: Secondary | ICD-10-CM | POA: Diagnosis present

## 2024-08-07 DIAGNOSIS — Z79899 Other long term (current) drug therapy: Secondary | ICD-10-CM | POA: Diagnosis not present

## 2024-08-07 DIAGNOSIS — Z7982 Long term (current) use of aspirin: Secondary | ICD-10-CM | POA: Diagnosis not present

## 2024-08-07 LAB — HEPATIC FUNCTION PANEL
ALT: 12 U/L (ref 0–44)
AST: 11 U/L — ABNORMAL LOW (ref 15–41)
Albumin: 2.7 g/dL — ABNORMAL LOW (ref 3.5–5.0)
Alkaline Phosphatase: 97 U/L (ref 38–126)
Bilirubin, Direct: <0.1 mg/dL (ref 0.0–0.2)
Total Bilirubin: 0.9 mg/dL (ref 0.0–1.2)
Total Protein: 6.9 g/dL (ref 6.5–8.1)

## 2024-08-07 LAB — LACTIC ACID, PLASMA: Lactic Acid, Venous: 1.1 mmol/L (ref 0.5–1.9)

## 2024-08-07 LAB — HEPATITIS B SURFACE ANTIGEN: Hepatitis B Surface Ag: NONREACTIVE

## 2024-08-07 MED ORDER — ACETAMINOPHEN 650 MG RE SUPP: 650.0000 mg | Freq: Four times a day (QID) | RECTAL | Status: DC | PRN

## 2024-08-07 MED ORDER — HYDROCODONE-ACETAMINOPHEN 5-325 MG PO TABS
1.0000 | ORAL_TABLET | Freq: Four times a day (QID) | ORAL | Status: DC | PRN
  Administered 2024-08-08 – 2024-08-09 (×2): 1 via ORAL
  Filled 2024-08-07 (×2): qty 1

## 2024-08-07 MED ORDER — ISOSORBIDE MONONITRATE ER 30 MG PO TB24
30.0000 mg | ORAL_TABLET | Freq: Every day | ORAL | Status: DC
  Administered 2024-08-09 – 2024-08-10 (×2): 30 mg via ORAL
  Filled 2024-08-07 (×2): qty 1

## 2024-08-07 MED ORDER — ACETAMINOPHEN 325 MG PO TABS: 650.0000 mg | ORAL_TABLET | Freq: Four times a day (QID) | ORAL | Status: DC | PRN

## 2024-08-07 MED ORDER — SODIUM CHLORIDE 0.9% FLUSH
3.0000 mL | Freq: Two times a day (BID) | INTRAVENOUS | Status: DC
  Administered 2024-08-07 – 2024-08-09 (×5): 3 mL via INTRAVENOUS

## 2024-08-07 MED ORDER — CHLORHEXIDINE GLUCONATE CLOTH 2 % EX PADS
6.0000 | MEDICATED_PAD | Freq: Every day | CUTANEOUS | Status: DC
  Administered 2024-08-09 – 2024-08-10 (×2): 6 via TOPICAL

## 2024-08-07 MED ORDER — SODIUM CHLORIDE 0.9 % IV SOLN: Freq: Once | INTRAVENOUS | Status: DC

## 2024-08-07 MED ORDER — HYDRALAZINE HCL 20 MG/ML IJ SOLN: 10.0000 mg | INTRAMUSCULAR | Status: DC | PRN

## 2024-08-07 MED ORDER — SODIUM CHLORIDE 0.9 % IV SOLN: Freq: Once | INTRAVENOUS | Status: AC

## 2024-08-07 MED ORDER — HEPARIN SODIUM (PORCINE) 5000 UNIT/ML IJ SOLN
5000.0000 [IU] | Freq: Three times a day (TID) | INTRAMUSCULAR | Status: DC
  Administered 2024-08-07 – 2024-08-10 (×9): 5000 [IU] via SUBCUTANEOUS
  Filled 2024-08-07 (×9): qty 1

## 2024-08-07 MED ORDER — ATORVASTATIN CALCIUM 10 MG PO TABS
20.0000 mg | ORAL_TABLET | Freq: Every day | ORAL | Status: DC
  Administered 2024-08-07 – 2024-08-10 (×3): 20 mg via ORAL
  Filled 2024-08-07 (×3): qty 2

## 2024-08-07 MED ORDER — SODIUM CHLORIDE 0.9 % IV SOLN
2.0000 g | INTRAVENOUS | Status: DC
  Administered 2024-08-08: 2 g via INTRAVENOUS
  Filled 2024-08-07: qty 20

## 2024-08-07 MED ORDER — PANTOPRAZOLE SODIUM 40 MG PO TBEC
40.0000 mg | DELAYED_RELEASE_TABLET | Freq: Every day | ORAL | Status: DC
  Administered 2024-08-07 – 2024-08-10 (×3): 40 mg via ORAL
  Filled 2024-08-07 (×3): qty 1

## 2024-08-07 MED ORDER — ASPIRIN 81 MG PO TBEC
81.0000 mg | DELAYED_RELEASE_TABLET | Freq: Every day | ORAL | Status: DC
  Administered 2024-08-07 – 2024-08-10 (×3): 81 mg via ORAL
  Filled 2024-08-07 (×3): qty 1

## 2024-08-07 MED ORDER — CARVEDILOL 25 MG PO TABS
25.0000 mg | ORAL_TABLET | Freq: Two times a day (BID) | ORAL | Status: DC
  Administered 2024-08-07 – 2024-08-09 (×4): 25 mg via ORAL
  Filled 2024-08-07 (×4): qty 1

## 2024-08-07 MED ORDER — SACUBITRIL-VALSARTAN 24-26 MG PO TABS
1.0000 | ORAL_TABLET | Freq: Two times a day (BID) | ORAL | Status: DC
  Administered 2024-08-07 – 2024-08-10 (×5): 1 via ORAL
  Filled 2024-08-07 (×5): qty 1

## 2024-08-07 NOTE — Progress Notes (Signed)
 Plan of Care Note for accepted transfer   Patient: Zachary Lynch MRN: [id number]   DOA: 08/06/2024  Facility requesting transfer: [facility]   Requesting Provider: Dr. Bernard   Reason for transfer: Pyelonephritis   Facility course: 38 yr old man with HTN, HFrEF, CVA, and ESRD on HD presenting with left flank pain and change in urine.   CT findings include left perinephric stranding concerning for pyelonephritis as well as complex or infected cyst in the left kidney.   He was treated with Rocephin , morphine , and Zofran .   Plan of care: The patient is accepted for admission to Telemetry unit, at [facility].   Author: Evalene GORMAN Sprinkles, MD 08/07/2024  Check [email or web address] for on-call coverage.  Nursing staff, Please call [facility] Admits & Consults System-Wide number on Amion as soon as patient's arrival, so appropriate admitting provider can evaluate the pt.

## 2024-08-07 NOTE — Hospital Course (Signed)
 SABRA

## 2024-08-07 NOTE — ED Notes (Signed)
 Florence Bare at [facility] for transport

## 2024-08-07 NOTE — Plan of Care (Signed)

## 2024-08-07 NOTE — Consult Note (Signed)
 Renal Service Consult Note [facility] Lamar JONETTA Fret, MD  Patient: Zachary Lynch Date: 08/07/2024 Requesting Physician: Dr. Charlton  Reason for Consult: ESRD patient with kidney infection HPI: The patient is a 38 y.o. year-old w/ PMH as below who presented to ED at [facility] complaining of left-sided flank pain and change in urine color and smell.  CT findings showed left perinephric stranding concerning for pyelonephritis and also possible infected cyst in the left kidney.  Patient was treated with Zofran  and morphine  and IV Rocephin .  Patient was admitted to [facility].  We are asked to see for dialysis.   Pt seen in room.  Blood pressure 130/91, heart rate 78, RR 16, temp 98, O2 sat 99% on room air.  K+ 3.5, creatinine 18.2, albumin  2.7, hemoglobin 9.6.  Patient states that he is on dialysis at [facility] for the last 14 months on a TTS schedule.  He thinks his dry weight is around 96 kg.  He says he still voids of a lot and usually they do not take any fluid off.  He has a right chest catheter, no other access.   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 Fernande   History of stroke 2010   Right MCA distribution infarct, felt to be possibly embolic in the setting of cardiomyopathy - placed on Coumadin at that time   Hyperlipidemia    Nonischemic cardiomyopathy (HCC) 03/28/2017   Obesity     Prediabetes 05/03/2017   Past Surgical History  Past Surgical History:  Procedure Laterality Date   IR FLUORO GUIDE CV LINE RIGHT  05/17/2023   IR FLUORO GUIDE CV LINE RIGHT  06/01/2023   IR US  GUIDE VASC ACCESS RIGHT  05/17/2023   RIGHT/LEFT HEART CATH AND CORONARY ANGIOGRAPHY N/A 05/18/2023   Procedure: RIGHT/LEFT HEART CATH AND CORONARY ANGIOGRAPHY;  Surgeon: Elmira Newman PARAS, MD;  Location: [facility] INVASIVE CV LAB;  Service: Cardiovascular;  Laterality: N/A;   Family History  Family History  Problem Relation Age of Onset   Stroke Cousin    Hypertension Mother    Heart attack Mother        Died age 43   Hypertension Father    Social History  reports that he has never smoked. He has never used smokeless tobacco. He reports that he does not drink alcohol and does not use drugs. Allergies  Allergies  Allergen Reactions   Albuterol Other (See Comments)    Reaction is unknown. Allergy was entered post Stroke caused him to have a stroke   Tramadol  Nausea And Vomiting    Disorientation   Home medications Prior to Admission medications   Medication Sig Start Date End Date Taking? Authorizing Provider  aspirin  EC (ASPIRIN  LOW DOSE) 81 MG tablet Take 1 tablet (81 mg total) by mouth daily. 04/18/24  Yes [name], Toribio  R, MD  atorvastatin  (LIPITOR) 20 MG tablet Take 1 tablet (20 mg total) by mouth daily. 03/21/24  Yes Milford, Harlene HERO, FNP  carvedilol  (COREG ) 25 MG tablet Take 1 tablet (25 mg total) by mouth 2 (two) times daily. 04/24/24  Yes   famotidine  (PEPCID ) 40 MG tablet Take 40 mg by mouth as needed for heartburn or indigestion. 08/04/23  Yes [provider]  isosorbide  mononitrate (IMDUR ) 30 MG 24 hr tablet Take 1 tablet (30 mg total) by mouth daily. 04/18/24  Yes [name], [name], MD  ondansetron  (ZOFRAN -ODT) 4 MG disintegrating tablet Take 4 mg by mouth every 8 (eight) hours as needed. 08/01/24  Yes [provider]  pantoprazole  (PROTONIX ) 40 MG tablet Take 1  tablet (40 mg total) by mouth daily. 02/24/24  Yes [name], [name], PA-C  predniSONE  (DELTASONE ) 20 MG tablet Take 2 tablets (40 mg total) by mouth daily for up to 5 days during gout flares. 04/24/24  Yes   predniSONE  (DELTASONE ) 20 MG tablet Take 2 tablets (40 mg total) by mouth daily for five days as needed for gout flares 07/11/24  Yes   sacubitril -valsartan  (ENTRESTO ) 24-26 MG Take 1 tablet by mouth 2 (two) times daily.   Yes [provider]     Vitals:   08/07/24 1137 08/07/24 1230 08/07/24 1351 08/07/24 1512  BP: (!) 129/95 (!) 130/96 (!) 134/91 (!) 134/97  Pulse: 83 78 75 78  Resp: 16 17 16 16   Temp: 98.7 F (37.1 C)  (!) 97.5 F (36.4 C) 98.3 F (36.8 C)  TempSrc:    Oral  SpO2: 99% 100% 100% 100%   Exam Gen alert, no distress, young adult Sclera anicteric, throat clear  No jvd or bruits Chest clear bilat to bases RRR no MRG Abd soft ntnd no mass or ascites +bs Ext no LE or UE edema, no other edema Neuro is alert, Ox 3 , nf    RIJ TDC intact    OP HD: [facility]  Dry wt 96kg (per pt)  Details pending  Assessment/ Plan: L pyelonephritis: per CT and symptoms. Getting IV abx per pmd.  ESRD: on HD TTS. Labs and vol are good, will plan on HD tomorrow am off schedule.  HTN: bp's borderline high. No vol excess on exam.  Volume: no vol excess, keep even on HD.  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   Myer Fret  MD [facility] 08/07/2024, 5:24 PM  Recent Labs  Lab 08/06/24 1651 08/07/24 1503  HGB 9.6*  --   ALBUMIN   --  2.7*  CALCIUM  9.7  --   CREATININE 18.20*  --   K 3.5  --    Inpatient medications:  aspirin  EC  81 mg Oral Daily   atorvastatin   20 mg Oral Daily   carvedilol   25 mg Oral BID WC   heparin   5,000 Units Subcutaneous Q8H   [START ON 08/08/2024] isosorbide  mononitrate  30 mg Oral Daily   pantoprazole   40 mg Oral Daily   sacubitril -valsartan   1 tablet Oral BID   sodium chloride  flush  3 mL Intravenous Q12H    [START ON 08/08/2024]  cefTRIAXone  (ROCEPHIN )  IV     acetaminophen  **OR** acetaminophen , hydrALAZINE , HYDROcodone -acetaminophen 

## 2024-08-07 NOTE — H&P (Signed)
 History and Physical    Patient: Zachary Lynch FMW:[id number] DOB: 05/05/1986 DOA: 08/06/2024 DOS: the patient was seen and examined on 08/07/2024 . PCP: Jerrell Cleatus Ned, MD  Patient coming from: [facility] Chief complaint: Left flank pain  HPI:  Zachary Lynch is a 38 y.o. male with past medical history  of stage renal disease on hemodialysis Tuesdays Thursdays and Saturdays, has a right sided port, was seen at [facility] yesterday for left-sided flank pain and dark urine.  Patient's received his last hemodialysis on Saturday.  No reports of fever chest pain shortness of breath or any other symptoms. No reports of nausea or vomiting.  Patient states that he has been urinating a lot more and is been on dialysis since about 2 years.  States he felt that he passed a clot and so he went to [facility] to be seen.  ED Course:  Vital signs in the ED were notable for the following:  Vitals:   08/07/24 1137 08/07/24 1230 08/07/24 1351 08/07/24 1512  BP: (!) 129/95 (!) 130/96 (!) 134/91 (!) 134/97  Pulse: 83 78 75 78  Temp: 98.7 F (37.1 C)  (!) 97.5 F (36.4 C) 98.3 F (36.8 C)  Resp: 16 17 16 16   SpO2: 99% 100% 100% 100%  TempSrc:    Oral  >>ED evaluation thus far shows: BMP today shows chloride 97 glucose 134 BUN 58 creatinine 18.20 anion gap of 19. LFTs ordered. CBC shows WBC count 8.3 hemoglobin of 9.6 platelets of 244. A1c of 5.72 weeks ago.   Abnormal urinalysis with turbid brown urine more than 50 WBCs and more than 50 RBCs.  Culture collected and pending.  CT abdomen and pelvis with contrast shows left pyelonephritis, left kidney cyst that may be a complex cyst or infected cyst with recommendations for evaluation with ultrasound which we will order. Bladder wall thickening versus underdistention, diverticulosis, nonobstructing left renal calculus. EKG ordered. Lactic acid ordered.  >>While in the ED patient received the following: Medications  iohexol  (OMNIPAQUE ) 300 MG/ML  solution 100 mL (85 mLs Intravenous Contrast Given 08/06/24 2301)  cefTRIAXone  (ROCEPHIN ) 2 g in sodium chloride  0.9 % 100 mL IVPB (0 g Intravenous Stopped 08/07/24 0108)  ondansetron  (ZOFRAN ) injection 4 mg (4 mg Intravenous Given 08/07/24 0024)  morphine  (PF) 4 MG/ML injection 4 mg (4 mg Intravenous Given 08/07/24 0025)   Review of Systems  Genitourinary:  Positive for dysuria, flank pain and frequency.  All other systems reviewed and are negative.  Past Medical History:  Diagnosis Date   Arthritis    CKD stage 4 secondary to hypertension (HCC) 03/28/2017   Elevated blood uric acid level    Essential hypertension    Folliculitis    Managed with doxycycline , topical clindamycin /benzoyl peroxide   GERD (gastroesophageal reflux disease)    Gout    HFrEF (heart failure with reduced ejection fraction) (HCC)    Echo 2019 with EF 30-35%, hypokinesis   History of cardiomyopathy    LVEF 15% in 2010 - evaluated by [facility] and Dr. Fernande   History of stroke 2010   Right MCA distribution infarct, felt to be possibly embolic in the setting of cardiomyopathy - placed on Coumadin at that time   Hyperlipidemia    Nonischemic cardiomyopathy (HCC) 03/28/2017   Obesity    Prediabetes 05/03/2017   Past Surgical History:  Procedure Laterality Date   IR FLUORO GUIDE CV LINE RIGHT  05/17/2023   IR FLUORO GUIDE CV LINE RIGHT  06/01/2023  IR US  GUIDE VASC ACCESS RIGHT  05/17/2023   RIGHT/LEFT HEART CATH AND CORONARY ANGIOGRAPHY N/A 05/18/2023   Procedure: RIGHT/LEFT HEART CATH AND CORONARY ANGIOGRAPHY;  Surgeon: Elmira Newman PARAS, MD;  Location: [facility] INVASIVE CV LAB;  Service: Cardiovascular;  Laterality: N/A;    reports that he has never smoked. He has never used smokeless tobacco. He reports that he does not drink alcohol and does not use drugs. Allergies  Allergen Reactions   Albuterol Other (See Comments)    Reaction is unknown. Allergy was entered post Stroke caused him to have a stroke    Tramadol  Nausea And Vomiting    Disorientation   Family History  Problem Relation Age of Onset   Stroke Cousin    Hypertension Mother    Heart attack Mother        Died age 42   Hypertension Father    Prior to Admission medications   Medication Sig Start Date End Date Taking? Authorizing Provider  aspirin  EC (ASPIRIN  LOW DOSE) 81 MG tablet Take 1 tablet (81 mg total) by mouth daily. 04/18/24  Yes [name], [name], MD  atorvastatin  (LIPITOR) 20 MG tablet Take 1 tablet (20 mg total) by mouth daily. 03/21/24  Yes Milford, Harlene HERO, FNP  carvedilol  (COREG ) 25 MG tablet Take 1 tablet (25 mg total) by mouth 2 (two) times daily. 04/24/24  Yes   famotidine  (PEPCID ) 40 MG tablet Take 40 mg by mouth as needed for heartburn or indigestion. 08/04/23  Yes [provider]  isosorbide  mononitrate (IMDUR ) 30 MG 24 hr tablet Take 1 tablet (30 mg total) by mouth daily. 04/18/24  Yes [name], [name], MD  ondansetron  (ZOFRAN -ODT) 4 MG disintegrating tablet Take 4 mg by mouth every 8 (eight) hours as needed. 08/01/24  Yes [provider]  pantoprazole  (PROTONIX ) 40 MG tablet Take 1 tablet (40 mg total) by mouth daily. 02/24/24  Yes [name], [name], PA-C  predniSONE  (DELTASONE ) 20 MG tablet Take 2 tablets (40 mg total) by mouth daily for up to 5 days during gout flares. 04/24/24  Yes   predniSONE  (DELTASONE ) 20 MG tablet Take 2 tablets (40 mg total) by mouth daily for five days as needed for gout flares 07/11/24  Yes   sacubitril -valsartan  (ENTRESTO ) 24-26 MG Take 1 tablet by mouth 2 (two) times daily.   Yes [provider]                                                                                 Vitals:   08/07/24 1137 08/07/24 1230 08/07/24 1351 08/07/24 1512  BP: (!) 129/95 (!) 130/96 (!) 134/91 (!) 134/97  Pulse: 83 78 75 78  Resp: 16 17 16 16   Temp: 98.7 F (37.1 C)  (!) 97.5 F (36.4 C) 98.3 F (36.8 C)  TempSrc:    Oral  SpO2: 99% 100% 100% 100%   Physical  Exam Vitals reviewed.  Constitutional:      General: He is not in acute distress.    Appearance: He is obese. He is not ill-appearing.  HENT:     Head: Normocephalic.  Eyes:     Extraocular Movements: Extraocular movements intact.  Cardiovascular:  Rate and Rhythm: Normal rate and regular rhythm.     Pulses: Normal pulses.     Heart sounds: Normal heart sounds.  Pulmonary:     Effort: Pulmonary effort is normal.     Breath sounds: Normal breath sounds.  Abdominal:     General: There is no distension.     Palpations: Abdomen is soft.     Tenderness: There is no abdominal tenderness. There is left CVA tenderness.  Musculoskeletal:     Right lower leg: No edema.     Left lower leg: No edema.  Neurological:     General: No focal deficit present.     Mental Status: He is alert and oriented to person, place, and time.     Labs on Admission: I have personally reviewed following labs and imaging studies CBC: Recent Labs  Lab 08/06/24 1651  WBC 8.3  HGB 9.6*  HCT 28.9*  MCV 88.1  PLT 244   Basic Metabolic Panel: Recent Labs  Lab 08/06/24 1651  NA 140  K 3.5  CL 97*  CO2 24  GLUCOSE 134*  BUN 58*  CREATININE 18.20*  CALCIUM  9.7   GFR: CrCl cannot be calculated (Unknown ideal weight.). Liver Function Tests: Recent Labs  Lab 08/07/24 1503  AST 11*  ALT 12  ALKPHOS 97  BILITOT 0.9  PROT 6.9  ALBUMIN  2.7*   No results for input(s): LIPASE, AMYLASE in the last 168 hours. No results for input(s): AMMONIA in the last 168 hours. Recent Labs    12/06/23 0118 12/07/23 0409 02/24/24 1340 06/09/24 1731 08/06/24 1651  BUN 83* 42* 42* 84* 58*  CREATININE 18.94* 11.10* 14.41* 19.63* 18.20*    Cardiac Enzymes: No results for input(s): CKTOTAL, CKMB, CKMBINDEX, TROPONINI in the last 168 hours. BNP (last 3 results) No results for input(s): PROBNP in the last 8760 hours. HbA1C: No results for input(s): HGBA1C in the last 72 hours. CBG: No  results for input(s): GLUCAP in the last 168 hours. Lipid Profile: No results for input(s): CHOL, HDL, LDLCALC, TRIG, CHOLHDL, LDLDIRECT in the last 72 hours. Thyroid Function Tests: No results for input(s): TSH, T4TOTAL, FREET4, T3FREE, THYROIDAB in the last 72 hours. Anemia Panel: No results for input(s): VITAMINB12, FOLATE, FERRITIN, TIBC, IRON , RETICCTPCT in the last 72 hours. Urine analysis:    Component Value Date/Time   COLORURINE BROWN (A) 08/06/2024 1652   APPEARANCEUR TURBID (A) 08/06/2024 1652   LABSPEC  08/06/2024 1652    TEST NOT REPORTED DUE TO COLOR INTERFERENCE OF URINE PIGMENT   PHURINE  08/06/2024 1652    TEST NOT REPORTED DUE TO COLOR INTERFERENCE OF URINE PIGMENT   GLUCOSEU (A) 08/06/2024 1652    TEST NOT REPORTED DUE TO COLOR INTERFERENCE OF URINE PIGMENT   HGBUR (A) 08/06/2024 1652    TEST NOT REPORTED DUE TO COLOR INTERFERENCE OF URINE PIGMENT   BILIRUBINUR (A) 08/06/2024 1652    TEST NOT REPORTED DUE TO COLOR INTERFERENCE OF URINE PIGMENT   KETONESUR (A) 08/06/2024 1652    TEST NOT REPORTED DUE TO COLOR INTERFERENCE OF URINE PIGMENT   PROTEINUR (A) 08/06/2024 1652    TEST NOT REPORTED DUE TO COLOR INTERFERENCE OF URINE PIGMENT   UROBILINOGEN 0.2 09/27/2008 1208   NITRITE (A) 08/06/2024 1652    TEST NOT REPORTED DUE TO COLOR INTERFERENCE OF URINE PIGMENT   LEUKOCYTESUR (A) 08/06/2024 1652    TEST NOT REPORTED DUE TO COLOR INTERFERENCE OF URINE PIGMENT   Radiological Exams on Admission: CT ABDOMEN  PELVIS W CONTRAST Result Date: 08/06/2024 CLINICAL DATA:  Left flank pain EXAM: CT ABDOMEN AND PELVIS WITH CONTRAST TECHNIQUE: Multidetector CT imaging of the abdomen and pelvis was performed using the standard protocol following bolus administration of intravenous contrast. RADIATION DOSE REDUCTION: This exam was performed according to the departmental dose-optimization program which includes automated exposure control, adjustment  of the mA and/or kV according to patient size and/or use of iterative reconstruction technique. CONTRAST:  85mL OMNIPAQUE  IOHEXOL  300 MG/ML  SOLN COMPARISON:  Pelvis CT 12/06/2023. CT renal stone 07/19/2016. CT chest 10/18/2020. FINDINGS: Lower chest: No acute abnormality. Hepatobiliary: No focal liver abnormality is seen. No gallstones, gallbladder wall thickening, or biliary dilatation. Pancreas: Unremarkable. No pancreatic ductal dilatation or surrounding inflammatory changes. Spleen: Normal in size without focal abnormality. Adrenals/Urinary Tract: There is left perinephric fat stranding. There is a single calculus in the superior pole of the left kidney. There is no hydronephrosis. There is normal renal enhancement bilaterally. There is an expansile oval low-attenuation area in the superior pole of the left kidney measuring 6.2 x 6.5 x 4.8 cm which has decreased in size compared to 2022. The borders are slightly ragged and thickened and there some questionable mild internal debris or septation along the medial border best seen on coronal image 4/40. On prior examination this appear to be a simple cyst, but wall findings are new from prior. There are additional rounded cortical hypodensities in both kidneys which are too small to characterize, possibly cysts. The adrenal glands are within normal limits. There is bladder wall thickening versus normal under distention. There is no surrounding inflammation. Next Stomach/Bowel: Stomach is within normal limits. Appendix appears normal. No evidence of bowel wall thickening, distention, or inflammatory changes. There scattered colonic diverticula. There is a large amount of stool throughout the colon. Vascular/Lymphatic: Aortic atherosclerosis. No enlarged abdominal or pelvic lymph nodes. Reproductive: Prostate gland is within normal limits. Other: No abdominal wall hernia or abnormality. No abdominopelvic ascites. Musculoskeletal: No acute or significant osseous  findings. IMPRESSION: 1. Left perinephric fat stranding worrisome for pyelonephritis. 2. Expansile low-attenuation area in the superior pole of the left kidney has decreased in size compared to 2022, but there is is new surrounding wall thickening and mild internal debris which is indeterminate. Findings may represent a complex cyst or infected cyst. Recommend further evaluation with ultrasound or MRI. 3. Nonobstructing left renal calculus. 4. Bladder wall thickening versus normal under distention. Correlate clinically for cystitis. 5. Colonic diverticulosis. Aortic Atherosclerosis (ICD10-I70.0). Electronically Signed   By: Greig Pique M.D.   On: 08/06/2024 23:34   Data Reviewed: Relevant notes from primary care and specialist visits, past discharge summaries as available in EHR, including Care Everywhere . Prior diagnostic testing as pertinent to current admission diagnoses, Updated medications and problem lists for reconciliation .ED course, including vitals, labs, imaging, treatment and response to treatment,Triage notes, nursing and pharmacy notes and ED provider's notes.Notable results as noted in HPI.Discussed case with EDMD/ ED APP/ or Specialty MD on call and as needed.  Assessment & Plan    >> Left pyelonephritis: Will continue patient on Rocephin  total dose of 5 days for pending.  As needed Tylenol .  Will follow blood culture sensitivity and tailor antibiotics and appropriate.  >> Left renal cyst: Abnormal and complex ovarian cyst will proceed with ultrasound and defer to a.m. team for MRI and urology consult once results are available.   >> End-stage renal disease on hemodialysis: Appreciate nephrology consult and management. Strict I's and O's  gentle IV fluid hydration overnight. Patient started on normal saline at 10 cc/h x 1.   >> Essential hypertension: Vitals:   08/07/24 0430 08/07/24 0500 08/07/24 0545 08/07/24 0600  BP: 117/87 118/85 112/83 120/86   08/07/24 0630 08/07/24  0800 08/07/24 0830 08/07/24 1000  BP: 113/86 118/85 (!) 121/92 122/87   08/07/24 1137 08/07/24 1230 08/07/24 1351 08/07/24 1512  BP: (!) 129/95 (!) 130/96 (!) 134/91 (!) 134/97  Patient continued on his Coreg , Imdur .   >> GERD: Continue patient on PPI therapy.   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    DVT prophylaxis:  Heparin  Consults:  Nephrology  Advance Care Planning:    Code Status: Full Code   Family Communication:  None Disposition Plan:  Home Severity of Illness: The appropriate patient status for this patient is INPATIENT. Inpatient status is judged to be reasonable and necessary in order to provide the required intensity of service to ensure the patient's safety. The patient's presenting symptoms, physical exam findings, and initial radiographic and laboratory data in the context of their chronic comorbidities is felt to place them at high risk for further clinical deterioration. Furthermore, it is not anticipated that the patient will be medically stable for discharge from the hospital within 2 midnights of admission.   * I certify that at the point of admission it is my clinical judgment that the patient will require inpatient hospital care spanning beyond 2 midnights from the point of admission due to high intensity of service, high risk for further deterioration and high frequency of surveillance required.*  Unresulted Labs (From admission, onward)     Start     Ordered   08/08/24 0500  Comprehensive metabolic panel  Tomorrow morning,   R        08/07/24 1430   08/08/24 0500  CBC  Tomorrow morning,   R        08/07/24 1430   08/07/24 1730  Hepatitis B surface antigen  (New Admission Hemo Labs (Hepatitis B))  Once,   R        08/07/24 1731   08/07/24 1730  Hepatitis B surface antibody,quantitative  (New Admission Hemo Labs (Hepatitis B))  Once,   R        08/07/24 1731   08/07/24 1503  Miscellaneous [facility]  test (send-out)  Once,   R        08/07/24 1503            Meds ordered this encounter  Medications   iohexol  (OMNIPAQUE ) 300 MG/ML solution 100 mL   cefTRIAXone  (ROCEPHIN ) 2 g in sodium chloride  0.9 % 100 mL IVPB    Antibiotic Indication::   UTI   ondansetron  (ZOFRAN ) injection 4 mg   morphine  (PF) 4 MG/ML injection 4 mg   aspirin  EC tablet 81 mg   atorvastatin  (LIPITOR) tablet 20 mg   carvedilol  (COREG ) tablet 25 mg   isosorbide  mononitrate (IMDUR ) 24 hr tablet 30 mg   pantoprazole  (PROTONIX ) EC tablet 40 mg   sacubitril -valsartan  (ENTRESTO ) 24-26 mg per tablet    ACE-inhibitors have NOT been administered in the past 36-hours.:   NOT APPLICABLE (continuing home med)   hydrALAZINE  (APRESOLINE ) injection 10 mg   cefTRIAXone  (ROCEPHIN ) 2 g in sodium chloride  0.9 % 100 mL IVPB    Antibiotic Indication::   UTI   heparin  injection 5,000 Units   sodium chloride  flush (NS) 0.9 % injection 3 mL   OR Linked Order  Group    acetaminophen  (TYLENOL ) tablet 650 mg    acetaminophen  (TYLENOL ) suppository 650 mg   HYDROcodone -acetaminophen  (NORCO/VICODIN) 5-325 MG per tablet 1 tablet    Refill:  0   Chlorhexidine  Gluconate Cloth 2 % PADS 6 each   DISCONTD: 0.9 %  sodium chloride  infusion   0.9 %  sodium chloride  infusion     Orders Placed This Encounter  Procedures   Urine Culture   CT ABDOMEN PELVIS W CONTRAST   US  Renal   Urinalysis, Routine w reflex microscopic -Urine, Clean Catch   Basic metabolic panel   CBC   Urinalysis, Microscopic (reflex)   Lactic acid, plasma   Hepatic function panel   Comprehensive metabolic panel   CBC   Miscellaneous [facility] test (send-out)   Hepatitis B surface antigen   Hepatitis B surface antibody,quantitative   Diet renal with fluid restriction Fluid restriction: 1200 mL Fluid; Room service appropriate? Yes; Fluid consistency: Thin   Maintain IV access   Vital signs   Notify physician (specify)   Mobility Protocol: No Restrictions   Refer  to Sidebar Report Mobility Protocol for Adult Inpatient   Initiate Adult Central Line Maintenance and Catheter Clearance Protocol for patients with central line (CVC, PICC, Port, Hemodialysis, Trialysis)   Daily weights   Intake and Output   Initiate CHG Protocol for patients in ICU/SD or any patient with a central line or foley catheter   Do not place and if present remove PureWick   Initiate Oral Care Protocol   Initiate Carrier Fluid Protocol   RN may order General Admission PRN Orders utilizing General Admission PRN medications (through manage orders) for the following patient needs: allergy symptoms (Claritin), cold sores (Carmex), cough (Robitussin DM), eye irritation (Liquifilm Tears), hemorrhoids (Tucks), indigestion (Maalox), minor skin irritation (Hydrocortisone Cream), muscle pain Lucienne Gay), nose irritation (saline nasal spray) and sore throat (Chloraseptic spray).   Patient has an active order for admit to inpatient/place in observation   Cardiac Monitoring Continuous x 48 hours Indications for use: Other; Other indications for use: Hemodialysis patient   Bladder scan   Informed Consent Details: Physician/Practitioner Attestation; Transcribe to consent form and obtain patient signature   Pre-Hemodialysis Protocol - Day of Dialysis   Post-Dialysis Protocol - Day of Dialysis   Full code   Consult to hospitalist   Pulse oximetry check with vital signs   Oxygen therapy Mode or (Route): Nasal cannula; Liters Per Minute: 2; Keep O2 saturation between: greater than 92 %   EKG   EKG   Hemodialysis inpatient   Admit to Inpatient (patient's expected length of stay will be greater than 2 midnights or inpatient only procedure)   Aspiration precautions   Fall precautions    Author: Mario LULLA Blanch, MD 12 pm- 8 pm. [facility]. 08/07/2024 6:12 PM Please note for any communication after hours contact [facility] Assigned provider on call on Amion.

## 2024-08-07 NOTE — Progress Notes (Signed)
 Message sent to Dr. Tobie requesting that a diet be ordered for patient.

## 2024-08-08 DIAGNOSIS — N12 Tubulo-interstitial nephritis, not specified as acute or chronic: Secondary | ICD-10-CM | POA: Diagnosis not present

## 2024-08-08 LAB — COMPREHENSIVE METABOLIC PANEL WITH GFR
ALT: 11 U/L (ref 0–44)
AST: <10 U/L — ABNORMAL LOW (ref 15–41)
Albumin: 2.6 g/dL — ABNORMAL LOW (ref 3.5–5.0)
Alkaline Phosphatase: 96 U/L (ref 38–126)
Anion gap: 19 — ABNORMAL HIGH (ref 5–15)
BUN: 76 mg/dL — ABNORMAL HIGH (ref 6–20)
CO2: 23 mmol/L (ref 22–32)
Calcium: 8.3 mg/dL — ABNORMAL LOW (ref 8.9–10.3)
Chloride: 98 mmol/L (ref 98–111)
Creatinine, Ser: 21.75 mg/dL — ABNORMAL HIGH (ref 0.61–1.24)
GFR, Estimated: 2 mL/min — ABNORMAL LOW (ref >60)
Glucose, Bld: 95 mg/dL (ref 70–99)
Potassium: 4.1 mmol/L (ref 3.5–5.1)
Sodium: 140 mmol/L (ref 135–145)
Total Bilirubin: 0.6 mg/dL (ref 0.0–1.2)
Total Protein: 6.5 g/dL (ref 6.5–8.1)

## 2024-08-08 LAB — MISC LABCORP TEST (SEND OUT): Labcorp test code: 83935

## 2024-08-08 LAB — URINE CULTURE: Culture: >=100000 — AB

## 2024-08-08 LAB — CBC
HCT: 27.8 % — ABNORMAL LOW (ref 39.0–52.0)
Hemoglobin: 9.2 g/dL — ABNORMAL LOW (ref 13.0–17.0)
MCH: 29.7 pg (ref 26.0–34.0)
MCHC: 33.1 g/dL (ref 30.0–36.0)
MCV: 89.7 fL (ref 80.0–100.0)
Platelets: 232 K/uL (ref 150–400)
RBC: 3.1 MIL/uL — ABNORMAL LOW (ref 4.22–5.81)
RDW: 14.4 % (ref 11.5–15.5)
WBC: 6.6 K/uL (ref 4.0–10.5)
nRBC: 0 % (ref 0.0–0.2)

## 2024-08-08 LAB — HEPATITIS B SURFACE ANTIBODY, QUANTITATIVE: Hep B S AB Quant (Post): 10512 m[IU]/mL

## 2024-08-08 MED ORDER — ALUM & MAG HYDROXIDE-SIMETH 200-200-20 MG/5ML PO SUSP
15.0000 mL | ORAL | Status: DC | PRN
  Administered 2024-08-08: 15 mL via ORAL
  Filled 2024-08-08: qty 30

## 2024-08-08 MED ORDER — HEPARIN SODIUM (PORCINE) 1000 UNIT/ML IJ SOLN
INTRAMUSCULAR | Status: AC
  Filled 2024-08-08: qty 4

## 2024-08-08 MED ORDER — PROCHLORPERAZINE EDISYLATE 10 MG/2ML IJ SOLN
10.0000 mg | Freq: Once | INTRAMUSCULAR | Status: AC
  Administered 2024-08-08: 10 mg via INTRAVENOUS
  Filled 2024-08-08: qty 2

## 2024-08-08 MED ORDER — NEPRO/CARBSTEADY PO LIQD: 237.0000 mL | ORAL | Status: DC | PRN

## 2024-08-08 MED ORDER — PENTAFLUOROPROP-TETRAFLUOROETH EX AERO: 1.0000 | INHALATION_SPRAY | CUTANEOUS | Status: DC | PRN

## 2024-08-08 MED ORDER — HEPARIN SODIUM (PORCINE) 1000 UNIT/ML DIALYSIS
1500.0000 [IU] | INTRAMUSCULAR | Status: AC | PRN
  Administered 2024-08-08: 1500 [IU] via INTRAVENOUS_CENTRAL

## 2024-08-08 MED ORDER — LIDOCAINE HCL (PF) 1 % IJ SOLN: 5.0000 mL | INTRAMUSCULAR | Status: DC | PRN

## 2024-08-08 MED ORDER — HEPARIN SODIUM (PORCINE) 1000 UNIT/ML DIALYSIS
1000.0000 [IU] | INTRAMUSCULAR | Status: DC | PRN
  Administered 2024-08-08: 3800 [IU]

## 2024-08-08 MED ORDER — ALTEPLASE 2 MG IJ SOLR: 2.0000 mg | Freq: Once | INTRAMUSCULAR | Status: DC | PRN

## 2024-08-08 MED ORDER — SODIUM CHLORIDE 0.9 % IV SOLN
500.0000 mg | INTRAVENOUS | Status: DC
  Administered 2024-08-08 – 2024-08-09 (×2): 500 mg via INTRAVENOUS
  Filled 2024-08-08 (×3): qty 500

## 2024-08-08 MED ORDER — HEPARIN SODIUM (PORCINE) 1000 UNIT/ML IJ SOLN
INTRAMUSCULAR | Status: AC
  Filled 2024-08-08: qty 6

## 2024-08-08 MED ORDER — ANTICOAGULANT SODIUM CITRATE 4% (200MG/5ML) IV SOLN: 5.0000 mL | Status: DC | PRN

## 2024-08-08 MED ORDER — LIDOCAINE-PRILOCAINE 2.5-2.5 % EX CREA: 1.0000 | TOPICAL_CREAM | CUTANEOUS | Status: DC | PRN

## 2024-08-08 MED ORDER — HEPARIN SODIUM (PORCINE) 1000 UNIT/ML DIALYSIS
2500.0000 [IU] | Freq: Once | INTRAMUSCULAR | Status: AC
  Administered 2024-08-08: 2500 [IU] via INTRAVENOUS_CENTRAL

## 2024-08-08 NOTE — Progress Notes (Addendum)
 Noted that pt will likely need iv abx, possibly ertapenem. Have requested pharmacy to provide specifics regarding dose. Once dosage info obtained, can contact out-pt HD clinic and begin to get approval. Will assist as needed.   Lavanda Miquela Costabile Dialysis Navigator [phone]  Addendum 2:56pm Got into contact with clinic [facility] and provided dosage details. Clinic stated they will order and advise once clearance obtained. Will continue to assist as needed.

## 2024-08-08 NOTE — Progress Notes (Signed)
 Patient arrived back to unit from Dialysis.  Still advises of nausea post treatment.  Given Compazine.  Patient advised this RN that this is a normal response post dialysis.  Advises that the nausea usually last 3-4 hours.  Resting with his eyes closed at this time.

## 2024-08-08 NOTE — Progress Notes (Signed)
 Pt receives out-pt HD at [facility], T[facility], 0515 am chair time. Contacted clinic to advise that [facility] is at [facility], will continue to assist as needed.   Lavanda Lambros [place] Dialysis Navigator [phone]

## 2024-08-08 NOTE — Hospital Course (Addendum)
 The patient is a 38 year old obese African-American male with a past medical history significant for but not limited to essential hypertension, heart failure with reduced ejection fraction, history of CVA, ESRD on HD who presented with left flank pain and changes in his urine.  CT of the abdomen pelvis was done and showed findings of left-sided perinephric stranding concerning for pyelonephritis as well as a complex infected mass of the left kidney.  He was initiated on IV ceftriaxone  and given pain control with morphine  and Zofran .  Cultures came back with ESBL E. coli so antibiotics were changed to IV ertapenem .  Nephrology was consulted and patient was taken for dialysis 11/19.  We are currently working up his left complex renal mass which is likely a cyst and IR has been consulted.  Nephrology has cleared the patient for a MRI with contrast and he will be dialyzed tomorrow off cycle.  Assessment and Plan:  Left Sided Pyelonephritis: Changed IV Ceftriaxone  to IV Ertapenem  given Sensitivities.  Will continue for now.  Urinalysis showed turbid appearance and was not able to be tested but did show no bacteria and greater than 50 RBCs per high-power field greater than 50 WBCs.  Urine culture done and showed ESBL E. coli.  CT scan of the abdomen and pelvis with contrast done and showed left perinephric fat stranding which was worrisome for pyelonephritis; He has a Hx of multiple scrotal abscesses so may need ID evaluation to help determine length of treatment  Complex left renal mass: CT scan also showed expansile low-attenuation area in the superior pole of the left kidney has decreased in size compared to 2022 but there is new surrounding wall thickening and mild internal debris which is intra-abdominal.  Findings could represent a complex cyst or infected cyst.  Recommendation was for further evaluation with ultrasound or MRI so we obtained an ultrasound renal which showed complex cystic left renal lesion  measuring up to 5.4 cm with thick peripheral vascular wall and trace internal echoes, concerning for a complex cystic renal mass requiring further characterization with contrast-enhanced MRI. Differential diagnosis includes abscess versus malignancy.  Discussed with Nephrology about getting an MRI with contrast to further characterize his renal mass versus cyst and they feel that with the newer gadolinium agents the risk of NSF is nearly 0 so they have cleared the patient to get this MRI w/wo Contrast done and patient to be dialyzed tomorrow. IR consulted for further evaluation and recommending obtaining MRI first prior to next steps   ESRD on HD TThSat: Patient being dialyzed today offschedule. BUN/Cr Trend: Recent Labs  Lab 08/06/24 1651 08/08/24 0514 08/09/24 0505 08/10/24 0445  BUN 58* 76* 41* 49*  CREATININE 18.20* 21.75* 14.07* 16.52*  -Stop IVF -Avoid Nephrotoxic Medications, Contrast Dyes, Hypotension and Dehydration to Ensure Adequate Renal Perfusion and will need to Renally Adjust Meds. CTM & Trend Renal Function carefully & repeat CMP in the AM  -Further Care per Nephrology and patient is to be dialyzed off cycle tomorrow given that he is getting MRI with and w/o contrast today  Essential HTN: C/w Carvedilol  25 mg po BID, Isosorbide  Mononitrate 30 mg po Daily, and Sacubitril -Valsartan  1 tab po BID. C/w IV Hydralazine  10 mg q4hprn for SBP >170. CTM BP per Protocol. 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 CTM For S/Sx  of Volume overload; Volume Maintenance by HD  HLD: C/w Atorvastatin  20 mg po at bedtime  Hx of CVA: C/w ASA 81 mg po Daily and Atorvastatin  20 mg po at bedtime; No longer taking Coumadin   Normocytic Anemia/Anemia of Chronic Kidney Disease:  Recent Labs  Lab 08/06/24 1651 08/08/24 0514  08/09/24 0505 08/10/24 0445  HGB 9.6* 9.2* 8.9* 9.0*  HCT 28.9* 27.8* 26.9* 27.6*  MCV 88.1 89.7 88.5 89.3  -Check Anemia Panel in the AM. CTM for S/Sx of Bleeding; No overt bleeding noted. Repeat CBC in the AM   GERD/GI Prophylaxis: Continue PPI w/ Pantoprazole  40 mg po daily   Hypoalbuminemia: Patient's Albumin  Lvl went from 2.7 -> 2.6 -> 2.5. CTM & Trend & repeat CMP in the AM  Class I Obesity: Complicates overall prognosis and care. Estimated body mass index is 30.06 kg/m as calculated from the following:   Height as of 07/06/24: 5' 10.5 (1.791 m).   Weight as of this encounter: 96.4 kg. Weight Loss and Dietary Counseling given

## 2024-08-08 NOTE — Progress Notes (Signed)
 Patient left floor for hemodialysis treatment.

## 2024-08-08 NOTE — Plan of Care (Signed)
  Problem: Education: Goal: Knowledge of General Education information will improve Description: Including pain rating scale, medication(s)/side effects and non-pharmacologic comfort measures 08/08/2024 0825 by German Adrien BRAVO, RN Outcome: Progressing 08/07/2024 2209 by German Adrien BRAVO, RN Outcome: Progressing   Problem: Health Behavior/Discharge Planning: Goal: Ability to manage health-related needs will improve 08/08/2024 0825 by German Adrien BRAVO, RN Outcome: Progressing 08/07/2024 2209 by German Adrien BRAVO, RN Outcome: Progressing   Problem: Clinical Measurements: Goal: Ability to maintain clinical measurements within normal limits will improve 08/08/2024 0825 by German Adrien BRAVO, RN Outcome: Progressing 08/07/2024 2209 by German Adrien BRAVO, RN Outcome: Progressing Goal: Will remain free from infection 08/08/2024 0825 by German Adrien BRAVO, RN Outcome: Progressing 08/07/2024 2209 by German Adrien BRAVO, RN Outcome: Progressing Goal: Diagnostic test results will improve 08/08/2024 0825 by German Adrien BRAVO, RN Outcome: Progressing 08/07/2024 2209 by German Adrien BRAVO, RN Outcome: Progressing Goal: Respiratory complications will improve 08/08/2024 0825 by German Adrien BRAVO, RN Outcome: Progressing 08/07/2024 2209 by German Adrien BRAVO, RN Outcome: Progressing Goal: Cardiovascular complication will be avoided 08/08/2024 0825 by German Adrien BRAVO, RN Outcome: Progressing 08/07/2024 2209 by German Adrien BRAVO, RN Outcome: Progressing   Problem: Activity: Goal: Risk for activity intolerance will decrease 08/08/2024 0825 by German Adrien BRAVO, RN Outcome: Progressing 08/07/2024 2209 by German Adrien BRAVO, RN Outcome: Progressing   Problem: Nutrition: Goal: Adequate nutrition will be maintained 08/08/2024 0825 by German Adrien BRAVO, RN Outcome: Progressing 08/07/2024 2209 by German Adrien BRAVO, RN Outcome: Progressing   Problem: Coping: Goal: Level of anxiety will decrease 08/08/2024 0825 by German Adrien BRAVO, RN Outcome: Progressing 08/07/2024 2209 by German Adrien BRAVO, RN Outcome: Progressing   Problem: Elimination: Goal: Will not experience complications related to bowel motility 08/08/2024 0825 by German Adrien BRAVO, RN Outcome: Progressing 08/07/2024 2209 by German Adrien BRAVO, RN Outcome: Progressing Goal: Will not experience complications related to urinary retention 08/08/2024 0825 by German Adrien BRAVO, RN Outcome: Progressing 08/07/2024 2209 by German Adrien BRAVO, RN Outcome: Progressing   Problem: Pain Managment: Goal: General experience of comfort will improve and/or be controlled 08/08/2024 0825 by German Adrien BRAVO, RN Outcome: Progressing 08/07/2024 2209 by German Adrien BRAVO, RN Outcome: Progressing   Problem: Safety: Goal: Ability to remain free from injury will improve 08/08/2024 0825 by German Adrien BRAVO, RN Outcome: Progressing 08/07/2024 2209 by German Adrien BRAVO, RN Outcome: Progressing   Problem: Skin Integrity: Goal: Risk for impaired skin integrity will decrease 08/08/2024 0825 by German Adrien BRAVO, RN Outcome: Progressing 08/07/2024 2209 by German Adrien BRAVO, RN Outcome: Progressing

## 2024-08-08 NOTE — Progress Notes (Signed)
 Pharmacy Antibiotic Note  Urine culture from 11/17 returned positive for ESBL E Coli. Patient being treated for left pyelonephritis. Primary physician contacted and decision to change to ertapenem.   If patient discharges before completing course of ertapenem, dosing information will be as follows at dialysis center:   Ertapenem 1000 mg at the end of every dialysis. Duration of therapy per primary team.   Rankin Sams 08/08/2024 1:43 PM

## 2024-08-08 NOTE — Progress Notes (Signed)
 PROGRESS NOTE    Zachary Lynch  FMW:[id number] DOB: [birth year] DOA: 08/06/2024 PCP: Jerrell Cleatus Ned, MD   Brief Narrative:  The patient is a 38 year old obese African-American male with a past medical history significant for but not limited to essential hypertension, heart failure with reduced ejection fraction, history of CVA, ESRD on HD who presented with left flank pain and changes in his urine.  CT of the abdomen pelvis was done and showed findings of left-sided perinephric stranding concerning for pyelonephritis as well as a complex infected mass of the left kidney.  He was initiated on IV ceftriaxone  and given pain control with morphine  and Zofran .  Cultures came back with ESBL E. coli so antibiotics were changed to IV ertapenem.  Nephrology was consulted and patient was taken for dialysis today.  Assessment and Plan:  Left Sided Pyelonephritis: Changed IV Ceftriaxone  to IV Ertapenem given Sensitivities.  Showed turbid appearance and was not able to be tested but did show no bacteria and greater than 50 RBCs per high-power field greater than 50 WBCs.  Urine culture done and showed ESBL E. coli.  CT scan of the abdomen and pelvis with contrast done and showed left perinephric fat stranding which was worrisome for pyelonephritis  Complex left renal mass: CT scan also showed expansile low-attenuation area in the superior pole of the left kidney has decreased in size compared to 2022 but there is new surrounding wall thickening and mild internal debris which is intra-abdominal.  Findings could represent a complex cyst or infected cyst.  Recommendation was for further evaluation with ultrasound or MRI so we obtained an ultrasound renal which showed complex cystic left renal lesion measuring up to 5.4 cm with thick peripheral vascular wall and trace internal echoes, concerning for a complex cystic renal mass requiring further characterization with contrast-enhanced MRI. Differential diagnosis  includes abscess versus malignancy.  Will discuss with nephrology about getting an MRI with contrast to further characterize his renal mass versus cyst   ESRD on HD TThSat: Patient being dialyzed today offschedule. BUN/Cr Trend: Recent Labs  Lab 08/06/24 1651 08/08/24 0514  BUN 58* 76*  CREATININE 18.20* 21.75*  -Stop IVF -Avoid Nephrotoxic Medications, Contrast Dyes, Hypotension and Dehydration to Ensure Adequate Renal Perfusion and will need to Renally Adjust Meds. CTM & Trend Renal Function carefully & repeat CMP in the AM  -Further Care per Nephrology  Essential HTN: C/w Carvedilol  25 mg po BID, Isosorbide  Mononitrate 30 mg po Daily, and Sacubitril -Valsartan  1 tab po BID. C/w IV Hydralazine  10 mg q4hprn for SBP >170. CTM BP per Protocol. 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 CTM For S/Sx of Volume overload; Volume Maintenance by HD  HLD: C/w Atorvastatin  20 mg po at bedtime  Hx of CVA: C/w ASA 81 mg po Daily and Atorvastatin  20 mg po at bedtime; No longer taking Coumadin   Normocytic Anemia/Anemia of Chronic Kidney Disease:  Recent Labs  Lab 08/06/24 1651 08/08/24 0514  HGB 9.6* 9.2*  HCT 28.9* 27.8*  MCV 88.1 89.7  -Check Anemia Panel in the AM. CTM for S/Sx of Bleeding; No overt bleeding noted. Repeat CBC in the AM   GERD/GI Prophylaxis: Continue PPI w/ Pantoprazole  40 mg po daily   Hypoalbuminemia: Patient's Albumin  Lvl went from 2.7 -> 2.6. CTM & Trend & repeat CMP in  the AM  Class I Obesity: Complicates overall prognosis and care. Estimated body mass index is 30.56 kg/m as calculated from the following:   Height as of 07/06/24: 5' 10.5 (1.791 m).   Weight as of this encounter: 98 kg. Weight Loss and Dietary Counseling given   DVT prophylaxis: heparin  injection 5,000 Units Start: 08/07/24  1530    Code Status: Full Code Family Communication: No family present @ bedside  Disposition Plan:  Level of care: Telemetry Status is: Inpatient Remains inpatient appropriate because: The clinical improvement and clearance by the specialist   Consultants:  Nephrology  Procedures:  As delineated as above  Antimicrobials:  Anti-infectives (From admission, onward)    Start     Dose/Rate Route Frequency Ordered Stop   08/08/24 1800  ertapenem  (INVANZ ) 500 mg in sodium chloride  0.9 % 50 mL IVPB        500 mg 100 mL/hr over 30 Minutes Intravenous Every 24 hours 08/08/24 1313     08/08/24 0600  cefTRIAXone  (ROCEPHIN ) 2 g in sodium chloride  0.9 % 100 mL IVPB  Status:  Discontinued        2 g 200 mL/hr over 30 Minutes Intravenous Every 24 hours 08/07/24 1415 08/08/24 1313   08/06/24 2345  cefTRIAXone  (ROCEPHIN ) 2 g in sodium chloride  0.9 % 100 mL IVPB        2 g 200 mL/hr over 30 Minutes Intravenous  Once 08/06/24 2338 08/07/24 0108       Subjective: Seen and examined and bedside in the dialysis unit and was still having some back pain.  No nausea or vomiting.  Feels okay.  Denies any lightheadedness or dizziness.  No other concerns or complaints at this time  Objective: Vitals:   08/08/24 1135 08/08/24 1155 08/08/24 1205 08/08/24 1426  BP: (!) 114/97 127/88 119/82 122/86  Pulse: 84 83 87 84  Resp: (!) 23 16 15 17   Temp:   98.2 F (36.8 C) 98.3 F (36.8 C)  TempSrc:    Oral  SpO2: 99% 99% 100% 100%  Weight:   98 kg     Intake/Output Summary (Last 24 hours) at 08/08/2024 1825 Last data filed at 08/08/2024 1819 Gross per 24 hour  Intake 108.67 ml  Output 175 ml  Net -66.33 ml   Filed Weights   08/08/24 0805 08/08/24 1205  Weight: 98 kg 98 kg   Examination: Physical Exam:  Constitutional: Obese chronically ill-appearing African-American male who is older appearing than his stated age resting in the dialysis unit Respiratory: Slightly diminished to auscultation  bilaterally, no wheezing, rales, rhonchi or crackles. Normal respiratory effort and patient is not tachypenic. No accessory muscle use.  Unlabored breathing Cardiovascular: RRR, no murmurs / rubs / gallops. S1 and S2 auscultated.  Extremity edema Abdomen: Soft, non-tender, distended secondary body habitus bowel sounds positive.  GU: Deferred. Musculoskeletal: No clubbing / cyanosis of digits/nails. No joint deformity upper and lower extremities.  Has a right IJ tunneled dialysis catheter which she is receiving dialysis from Skin: No rashes, lesions, ulcers on limited skin evaluation. No induration; Warm and dry.  Neurologic: CN 2-12 grossly intact with no focal deficits. Romberg sign and cerebellar reflexes not assessed.  Psychiatric: Normal judgment and insight. Alert and oriented x 3.  Data Reviewed: I have personally reviewed following labs and imaging studies  CBC: Recent Labs  Lab 08/06/24 1651 08/08/24 0514  WBC 8.3 6.6  HGB 9.6* 9.2*  HCT 28.9* 27.8*  MCV 88.1 89.7  PLT 244  232   Basic Metabolic Panel: Recent Labs  Lab 08/06/24 1651 08/08/24 0514  NA 140 140  K 3.5 4.1  CL 97* 98  CO2 24 23  GLUCOSE 134* 95  BUN 58* 76*  CREATININE 18.20* 21.75*  CALCIUM  9.7 8.3*   GFR: Estimated Creatinine Clearance: 5.3 mL/min (A) (by C-G formula based on SCr of 21.75 mg/dL (H)). Liver Function Tests: Recent Labs  Lab 08/07/24 1503 08/08/24 0514  AST 11* <10*  ALT 12 11  ALKPHOS 97 96  BILITOT 0.9 0.6  PROT 6.9 6.5  ALBUMIN  2.7* 2.6*   No results for input(s): LIPASE, AMYLASE in the last 168 hours. No results for input(s): AMMONIA in the last 168 hours. Coagulation Profile: No results for input(s): INR, PROTIME in the last 168 hours. Cardiac Enzymes: No results for input(s): CKTOTAL, CKMB, CKMBINDEX, TROPONINI in the last 168 hours. BNP (last 3 results) No results for input(s): PROBNP in the last 8760 hours. HbA1C: No results for input(s):  HGBA1C in the last 72 hours. CBG: No results for input(s): GLUCAP in the last 168 hours. Lipid Profile: No results for input(s): CHOL, HDL, LDLCALC, TRIG, CHOLHDL, LDLDIRECT in the last 72 hours. Thyroid Function Tests: No results for input(s): TSH, T4TOTAL, FREET4, T3FREE, THYROIDAB in the last 72 hours. Anemia Panel: No results for input(s): VITAMINB12, FOLATE, FERRITIN, TIBC, IRON , RETICCTPCT in the last 72 hours. Sepsis Labs: Recent Labs  Lab 08/07/24 1503  LATICACIDVEN 1.1   Recent Results (from the past 240 hours)  Urine Culture     Status: Abnormal   Collection Time: 08/06/24  4:52 PM   Specimen: Urine, Clean Catch  Result Value Ref Range Status   Specimen Description   Final    URINE, CLEAN CATCH Performed at [facility], [address] [place], [place] [address]    Special Requests   Final    NONE Performed at [facility], [address], [place], [place] [address]    Culture (A)  Final    >=100,000 COLONIES/mL ESCHERICHIA COLI Confirmed Extended Spectrum Beta-Lactamase Producer (ESBL).  In bloodstream infections from ESBL organisms, carbapenems are preferred over piperacillin /tazobactam. They are shown to have a lower risk of mortality.    Report Status 08/08/2024 FINAL  Final   Organism ID, Bacteria ESCHERICHIA COLI (A)  Final      Susceptibility   Escherichia coli - MIC*    AMPICILLIN >=32 RESISTANT Resistant     CEFAZOLIN  (URINE) Value in next row Resistant      >=32 RESISTANTThis is a modified FDA-approved test that has been validated and its performance characteristics determined by the reporting laboratory.  This laboratory is certified under the Clinical Laboratory Improvement Amendments CLIA as qualified to perform high complexity clinical laboratory testing.    CEFEPIME Value in next row Sensitive      >=32 RESISTANTThis is a modified FDA-approved test that has been validated  and its performance characteristics determined by the reporting laboratory.  This laboratory is certified under the Clinical Laboratory Improvement Amendments CLIA as qualified to perform high complexity clinical laboratory testing.    ERTAPENEM  Value in next row Sensitive      >=32 RESISTANTThis is a modified FDA-approved test that has been validated and its performance characteristics determined by the reporting laboratory.  This laboratory is certified under the Clinical Laboratory Improvement Amendments CLIA as qualified to perform high complexity clinical laboratory testing.    CEFTRIAXONE  Value in next row Resistant      >=32  RESISTANTThis is a modified FDA-approved test that has been validated and its performance characteristics determined by the reporting laboratory.  This laboratory is certified under the Clinical Laboratory Improvement Amendments CLIA as qualified to perform high complexity clinical laboratory testing.    CIPROFLOXACIN  Value in next row Resistant      >=32 RESISTANTThis is a modified FDA-approved test that has been validated and its performance characteristics determined by the reporting laboratory.  This laboratory is certified under the Clinical Laboratory Improvement Amendments CLIA as qualified to perform high complexity clinical laboratory testing.    GENTAMICIN Value in next row Sensitive      >=32 RESISTANTThis is a modified FDA-approved test that has been validated and its performance characteristics determined by the reporting laboratory.  This laboratory is certified under the Clinical Laboratory Improvement Amendments CLIA as qualified to perform high complexity clinical laboratory testing.    NITROFURANTOIN Value in next row Sensitive      >=32 RESISTANTThis is a modified FDA-approved test that has been validated and its performance characteristics determined by the reporting laboratory.  This laboratory is certified under the Clinical Laboratory Improvement  Amendments CLIA as qualified to perform high complexity clinical laboratory testing.    TRIMETH/SULFA Value in next row Resistant      >=32 RESISTANTThis is a modified FDA-approved test that has been validated and its performance characteristics determined by the reporting laboratory.  This laboratory is certified under the Clinical Laboratory Improvement Amendments CLIA as qualified to perform high complexity clinical laboratory testing.    AMPICILLIN/SULBACTAM Value in next row Sensitive      >=32 RESISTANTThis is a modified FDA-approved test that has been validated and its performance characteristics determined by the reporting laboratory.  This laboratory is certified under the Clinical Laboratory Improvement Amendments CLIA as qualified to perform high complexity clinical laboratory testing.    PIP/TAZO Value in next row Sensitive      <=4 SENSITIVEThis is a modified FDA-approved test that has been validated and its performance characteristics determined by the reporting laboratory.  This laboratory is certified under the Clinical Laboratory Improvement Amendments CLIA as qualified to perform high complexity clinical laboratory testing.    MEROPENEM Value in next row Sensitive      <=4 SENSITIVEThis is a modified FDA-approved test that has been validated and its performance characteristics determined by the reporting laboratory.  This laboratory is certified under the Clinical Laboratory Improvement Amendments CLIA as qualified to perform high complexity clinical laboratory testing.    * >=100,000 COLONIES/mL ESCHERICHIA COLI    Radiology Studies: US  Renal Result Date: 08/07/2024 EXAM: US  Retroperitoneum Complete, Renal. 08/07/2024 06:11:15 PM TECHNIQUE: Real-time ultrasonography of the retroperitoneum renal was performed. COMPARISON: US  Renal 11/21/2015. CT Abdomen and Pelvis 08/06/2024. CLINICAL HISTORY: Renal cyst. FINDINGS: FINDINGS: RIGHT KIDNEY/URETER: Right kidney measures 7.5 x 3.5 x 4.7  cm. Normal cortical echogenicity. No hydronephrosis. No calculus. Simple renal cysts. LEFT KIDNEY/URETER: Left kidney measures 11.9 x 6.3 x 4.6 cm. Normal cortical echogenicity. No hydronephrosis. No calculus. Simple renal cysts. There is a 5.3 x 5 x 5.4 cm mass complex cystic left renal lesion with associated thick peripheral vascular wall and trace internal echoes. This lesion measures up to 5.4 cm. BLADDER: Unremarkable appearance of the bladder. IMPRESSION: 1. Complex cystic left renal lesion measuring up to 5.4 cm with thick peripheral vascular wall and trace internal echoes, concerning for a complex cystic renal mass requiring further characterization with contrast-enhanced MRI. Differential diagnosis includes abscess versus malignancy. Electronically signed by:  Kate Plummer MD 08/07/2024 06:45 PM EST RP Workstation: [id number]   CT ABDOMEN PELVIS W CONTRAST Result Date: 08/06/2024 CLINICAL DATA:  Left flank pain EXAM: CT ABDOMEN AND PELVIS WITH CONTRAST TECHNIQUE: Multidetector CT imaging of the abdomen and pelvis was performed using the standard protocol following bolus administration of intravenous contrast. RADIATION DOSE REDUCTION: This exam was performed according to the departmental dose-optimization program which includes automated exposure control, adjustment of the mA and/or kV according to patient size and/or use of iterative reconstruction technique. CONTRAST:  85mL OMNIPAQUE  IOHEXOL  300 MG/ML  SOLN COMPARISON:  Pelvis CT 12/06/2023. CT renal stone 07/19/2016. CT chest 10/18/2020. FINDINGS: Lower chest: No acute abnormality. Hepatobiliary: No focal liver abnormality is seen. No gallstones, gallbladder wall thickening, or biliary dilatation. Pancreas: Unremarkable. No pancreatic ductal dilatation or surrounding inflammatory changes. Spleen: Normal in size without focal abnormality. Adrenals/Urinary Tract: There is left perinephric fat stranding. There is a single calculus in the superior pole  of the left kidney. There is no hydronephrosis. There is normal renal enhancement bilaterally. There is an expansile oval low-attenuation area in the superior pole of the left kidney measuring 6.2 x 6.5 x 4.8 cm which has decreased in size compared to 2022. The borders are slightly ragged and thickened and there some questionable mild internal debris or septation along the medial border best seen on coronal image 4/40. On prior examination this appear to be a simple cyst, but wall findings are new from prior. There are additional rounded cortical hypodensities in both kidneys which are too small to characterize, possibly cysts. The adrenal glands are within normal limits. There is bladder wall thickening versus normal under distention. There is no surrounding inflammation. Next Stomach/Bowel: Stomach is within normal limits. Appendix appears normal. No evidence of bowel wall thickening, distention, or inflammatory changes. There scattered colonic diverticula. There is a large amount of stool throughout the colon. Vascular/Lymphatic: Aortic atherosclerosis. No enlarged abdominal or pelvic lymph nodes. Reproductive: Prostate gland is within normal limits. Other: No abdominal wall hernia or abnormality. No abdominopelvic ascites. Musculoskeletal: No acute or significant osseous findings. IMPRESSION: 1. Left perinephric fat stranding worrisome for pyelonephritis. 2. Expansile low-attenuation area in the superior pole of the left kidney has decreased in size compared to 2022, but there is is new surrounding wall thickening and mild internal debris which is indeterminate. Findings may represent a complex cyst or infected cyst. Recommend further evaluation with ultrasound or MRI. 3. Nonobstructing left renal calculus. 4. Bladder wall thickening versus normal under distention. Correlate clinically for cystitis. 5. Colonic diverticulosis. Aortic Atherosclerosis (ICD10-I70.0). Electronically Signed   By: Greig Pique M.D.    On: 08/06/2024 23:34   Scheduled Meds:  aspirin  EC  81 mg Oral Daily   atorvastatin   20 mg Oral Daily   carvedilol   25 mg Oral BID WC   Chlorhexidine  Gluconate Cloth  6 each Topical Q0600   heparin   5,000 Units Subcutaneous Q8H   isosorbide  mononitrate  30 mg Oral Daily   pantoprazole   40 mg Oral Daily   sacubitril -valsartan   1 tablet Oral BID   sodium chloride  flush  3 mL Intravenous Q12H   Continuous Infusions:  ertapenem  100 mL/hr at 08/08/24 1819    LOS: 1 day   Alejandro Marker, DO [facility] Available via Epic secure chat 7am-7pm After these hours, please refer to coverage provider listed on [email or web address] 08/08/2024, 6:25 PM

## 2024-08-08 NOTE — Progress Notes (Signed)
  [facility] Progress Note   Subjective:  Seen in [facility] - on dialysis.  Afebrile. Has dull ache on left side.   Objective Vitals:   08/08/24 0532 08/08/24 0733 08/08/24 0805 08/08/24 0814  BP: 123/87 118/79 123/88 (!) 125/92  Pulse: 79 76 78 84  Resp: 18 16 17  (!) 25  Temp: 98.6 F (37 C) 98.1 F (36.7 C) 98.3 F (36.8 C)   TempSrc: Oral Oral    SpO2: 100% 100% 98% 98%  Weight:   98 kg     Additional Objective Labs: Basic Metabolic Panel: Recent Labs  Lab 08/06/24 1651 08/08/24 0514  NA 140 140  K 3.5 4.1  CL 97* 98  CO2 24 23  GLUCOSE 134* 95  BUN 58* 76*  CREATININE 18.20* 21.75*  CALCIUM  9.7 8.3*   CBC: Recent Labs  Lab 08/06/24 1651 08/08/24 0514  WBC 8.3 6.6  HGB 9.6* 9.2*  HCT 28.9* 27.8*  MCV 88.1 89.7  PLT 244 232   Blood Culture    Component Value Date/Time   SDES  08/06/2024 1652    URINE, CLEAN CATCH Performed at [facility], [address] [place], [place] [address]    [facility]  08/06/2024 1652    NONE Performed at [facility], [address], [place], [place] [address]    CULT (A) 08/06/2024 1652    >=100,000 COLONIES/mL GRAM NEGATIVE RODS IDENTIFICATION AND SUSCEPTIBILITIES TO FOLLOW Performed at [facility], [address], [place], [place] [address]    REPTSTATUS PENDING 08/06/2024 1652   Physical Exam General: Alert, nad Heart: RRR Lungs: Normal wob Abdomen: non-tender Extremities: no LE edema  Dialysis Access: TDC   Medications:  anticoagulant sodium citrate      cefTRIAXone  (ROCEPHIN )  IV 2 g (08/08/24 0522)    aspirin  EC  81 mg Oral Daily   atorvastatin   20 mg Oral Daily   carvedilol   25 mg Oral BID WC   Chlorhexidine  Gluconate Cloth  6 each Topical Q0600   [START ON 08/09/2024] heparin   2,500 Units Dialysis Once in dialysis   heparin   5,000 Units Subcutaneous Q8H   isosorbide  mononitrate  30 mg Oral Daily   pantoprazole   40 mg Oral Daily    sacubitril -valsartan   1 tablet Oral BID   sodium chloride  flush  3 mL Intravenous Q12H    Dialysis Orders:  [facility] TTS  4H 450/600 EDW 97.5kg 2K/2.5Ca  TDC Heparin  1000 bolus + 1000 u/hr -Mircera 50 q 4 weeks (last 10/30) -Cinacalcet 90 mg TIW  Assessment/Plan: L pyelonephritis: per CT and symptoms. On IV Rocephin  here.  ESRD: on HD TTS. HD off schedule today.  HTN:  BP acceptable. Continue home meds.  Volume: no vol excess, keep even on HD.  Anemia. Hgb 9s. On ESA as outpatient. Follow trends.  2HPTH. Ca acceptable. Check phos. Continue cinacalcet.   HFrEF 40-45%. Appears euvolemic. On Entresto .  Complex L renal cyst. On CT   Maisie Ronnald Acosta PA-C [facility] 08/08/2024,8:41 AM

## 2024-08-09 ENCOUNTER — Inpatient Hospital Stay (HOSPITAL_COMMUNITY)

## 2024-08-09 DIAGNOSIS — N186 End stage renal disease: Secondary | ICD-10-CM | POA: Diagnosis not present

## 2024-08-09 DIAGNOSIS — N12 Tubulo-interstitial nephritis, not specified as acute or chronic: Secondary | ICD-10-CM | POA: Diagnosis not present

## 2024-08-09 DIAGNOSIS — N281 Cyst of kidney, acquired: Secondary | ICD-10-CM | POA: Diagnosis not present

## 2024-08-09 DIAGNOSIS — N289 Disorder of kidney and ureter, unspecified: Secondary | ICD-10-CM

## 2024-08-09 DIAGNOSIS — E669 Obesity, unspecified: Secondary | ICD-10-CM | POA: Diagnosis not present

## 2024-08-09 DIAGNOSIS — Z992 Dependence on renal dialysis: Secondary | ICD-10-CM

## 2024-08-09 LAB — FOLATE: Folate: 5.7 ng/mL — ABNORMAL LOW (ref >5.9)

## 2024-08-09 LAB — COMPREHENSIVE METABOLIC PANEL WITH GFR
ALT: 12 U/L (ref 0–44)
AST: 11 U/L — ABNORMAL LOW (ref 15–41)
Albumin: 2.5 g/dL — ABNORMAL LOW (ref 3.5–5.0)
Alkaline Phosphatase: 98 U/L (ref 38–126)
Anion gap: 13 (ref 5–15)
BUN: 41 mg/dL — ABNORMAL HIGH (ref 6–20)
CO2: 26 mmol/L (ref 22–32)
Calcium: 8.7 mg/dL — ABNORMAL LOW (ref 8.9–10.3)
Chloride: 96 mmol/L — ABNORMAL LOW (ref 98–111)
Creatinine, Ser: 14.07 mg/dL — ABNORMAL HIGH (ref 0.61–1.24)
GFR, Estimated: 4 mL/min — ABNORMAL LOW (ref >60)
Glucose, Bld: 88 mg/dL (ref 70–99)
Potassium: 3.8 mmol/L (ref 3.5–5.1)
Sodium: 135 mmol/L (ref 135–145)
Total Bilirubin: 0.6 mg/dL (ref 0.0–1.2)
Total Protein: 6.5 g/dL (ref 6.5–8.1)

## 2024-08-09 LAB — CBC WITH DIFFERENTIAL/PLATELET
Abs Immature Granulocytes: 0.02 K/uL (ref 0.00–0.07)
Basophils Absolute: 0 K/uL (ref 0.0–0.1)
Basophils Relative: 1 %
Eosinophils Absolute: 0.1 K/uL (ref 0.0–0.5)
Eosinophils Relative: 2 %
HCT: 26.9 % — ABNORMAL LOW (ref 39.0–52.0)
Hemoglobin: 8.9 g/dL — ABNORMAL LOW (ref 13.0–17.0)
Immature Granulocytes: 0 %
Lymphocytes Relative: 38 %
Lymphs Abs: 2.2 K/uL (ref 0.7–4.0)
MCH: 29.3 pg (ref 26.0–34.0)
MCHC: 33.1 g/dL (ref 30.0–36.0)
MCV: 88.5 fL (ref 80.0–100.0)
Monocytes Absolute: 0.5 K/uL (ref 0.1–1.0)
Monocytes Relative: 8 %
Neutro Abs: 2.9 K/uL (ref 1.7–7.7)
Neutrophils Relative %: 51 %
Platelets: 222 K/uL (ref 150–400)
RBC: 3.04 MIL/uL — ABNORMAL LOW (ref 4.22–5.81)
RDW: 13.9 % (ref 11.5–15.5)
WBC: 5.7 K/uL (ref 4.0–10.5)
nRBC: 0 % (ref 0.0–0.2)

## 2024-08-09 LAB — PHOSPHORUS: Phosphorus: 7.5 mg/dL — ABNORMAL HIGH (ref 2.5–4.6)

## 2024-08-09 LAB — RETICULOCYTES
Immature Retic Fract: 7.1 % (ref 2.3–15.9)
RBC.: 3.08 MIL/uL — ABNORMAL LOW (ref 4.22–5.81)
Retic Count, Absolute: 30.8 K/uL (ref 19.0–186.0)
Retic Ct Pct: 1 % (ref 0.4–3.1)

## 2024-08-09 LAB — IRON AND TIBC
Iron: 56 ug/dL (ref 45–182)
Saturation Ratios: 38 % (ref 17.9–39.5)
TIBC: 147 ug/dL — ABNORMAL LOW (ref 250–450)
UIBC: 91 ug/dL

## 2024-08-09 LAB — FERRITIN: Ferritin: 91 ng/mL (ref 24–336)

## 2024-08-09 LAB — MAGNESIUM: Magnesium: 2 mg/dL (ref 1.7–2.4)

## 2024-08-09 LAB — VITAMIN B12: Vitamin B-12: 330 pg/mL (ref 180–914)

## 2024-08-09 MED ORDER — GADOBUTROL 1 MMOL/ML IV SOLN
9.0000 mL | Freq: Once | INTRAVENOUS | Status: AC | PRN
  Administered 2024-08-09: 9 mL via INTRAVENOUS

## 2024-08-09 MED ORDER — SEVELAMER CARBONATE 800 MG PO TABS
800.0000 mg | ORAL_TABLET | Freq: Three times a day (TID) | ORAL | Status: DC
  Administered 2024-08-09: 800 mg via ORAL
  Filled 2024-08-09 (×2): qty 1

## 2024-08-09 NOTE — Progress Notes (Signed)
  [facility] Progress Note   Subjective:   Completed dialysis yesterday, no UF Seen in room. No complaints MRI today for complex renal cyst   Objective Vitals:   08/08/24 1426 08/08/24 2001 08/09/24 0352 08/09/24 0800  BP: 122/86 114/73 125/88 (!) 133/96  Pulse: 84 82 84 82  Resp: 17   16  Temp: 98.3 F (36.8 C) 98.9 F (37.2 C) 98.4 F (36.9 C) 98.4 F (36.9 C)  TempSrc: Oral Oral Oral   SpO2: 100% 100% 98% 100%  Weight:        Additional Objective Labs: Basic Metabolic Panel: Recent Labs  Lab 08/06/24 1651 08/08/24 0514 08/09/24 0505  NA 140 140 135  K 3.5 4.1 3.8  CL 97* 98 96*  CO2 24 23 26   GLUCOSE 134* 95 88  BUN 58* 76* 41*  CREATININE 18.20* 21.75* 14.07*  CALCIUM  9.7 8.3* 8.7*  PHOS  --   --  7.5*   CBC: Recent Labs  Lab 08/06/24 1651 08/08/24 0514 08/09/24 0505  WBC 8.3 6.6 5.7  NEUTROABS  --   --  2.9  HGB 9.6* 9.2* 8.9*  HCT 28.9* 27.8* 26.9*  MCV 88.1 89.7 88.5  PLT 244 232 222   Blood Culture    Component Value Date/Time   SDES  08/06/2024 1652    URINE, CLEAN CATCH Performed at [facility], [address] [place], [place] [address]    [facility]  08/06/2024 1652    NONE Performed at [facility], [address], [place], [place] [address]    CULT (A) 08/06/2024 1652    >=100,000 COLONIES/mL ESCHERICHIA COLI Confirmed Extended Spectrum Beta-Lactamase Producer (ESBL).  In bloodstream infections from ESBL organisms, carbapenems are preferred over piperacillin /tazobactam. They are shown to have a lower risk of mortality.    REPTSTATUS 08/08/2024 FINAL 08/06/2024 1652   Physical Exam General: Alert, nad Heart: RRR Lungs: Normal wob Abdomen: non-tender Extremities: no LE edema  Dialysis Access: [facility]   Medications:  ertapenem 100 mL/hr at 08/08/24 1819    aspirin  EC  81 mg Oral Daily   atorvastatin   20 mg Oral Daily   carvedilol   25 mg Oral BID WC   Chlorhexidine   Gluconate Cloth  6 each Topical Q0600   heparin   5,000 Units Subcutaneous Q8H   isosorbide  mononitrate  30 mg Oral Daily   pantoprazole   40 mg Oral Daily   sacubitril -valsartan   1 tablet Oral BID   sodium chloride  flush  3 mL Intravenous Q12H    Dialysis Orders:  [facility]  4H 450/600 EDW 97.5kg 2K/2.5Ca  TDC Heparin  1000 bolus + 1000 u/hr -Mircera 50 q 4 weeks (last 10/30) -Cinacalcet 90 mg TIW  Assessment/Plan: ESBL E. Coli UTI/pyelonephritis. Receiving IV ertapenem. Per primary  ESRD: on HD TTS. HD off schedule this week.Had HD Wed. Next HD Fri.  HTN:  BP acceptable. Continue home meds.  Volume: no vol excess  Anemia. Hgb 9s. On ESA as outpatient. Follow trends.  2HPTH. Ca acceptable.Add Renevla binder for hyperphos.  HFrEF 40-45%. Appears euvolemic. On Entresto .  Complex L renal cyst. MRI today for further eval.   Maisie Ronnald Acosta PA-C [facility] 08/09/2024,9:01 AM

## 2024-08-09 NOTE — Progress Notes (Signed)
 Clinic [facility] will need end of duration for iv abx if needed to be given at out-pt HD clinic, have advised pharmacy and MD at this time. Will continue to follow.   Lavanda Royal [place] Dialysis Navigator [phone]

## 2024-08-09 NOTE — Progress Notes (Signed)
 PROGRESS NOTE    Zachary Lynch  FMW:[id number] DOB: 11/27/1985 DOA: 08/06/2024 PCP: Jerrell Cleatus Ned, MD   Brief Narrative:  The patient is a 38 year old obese African-American male with a past medical history significant for but not limited to essential hypertension, heart failure with reduced ejection fraction, history of CVA, ESRD on HD who presented with left flank pain and changes in his urine.  CT of the abdomen pelvis was done and showed findings of left-sided perinephric stranding concerning for pyelonephritis as well as a complex infected mass of the left kidney.  He was initiated on IV ceftriaxone  and given pain control with morphine  and Zofran .  Cultures came back with ESBL E. coli so antibiotics were changed to IV ertapenem.  Nephrology was consulted and patient was taken for dialysis 11/19.  We are currently working up his left complex renal mass which is likely a cyst and IR has been consulted.  Nephrology has cleared the patient for a MRI with contrast and he will be dialyzed tomorrow off cycle.  Assessment and Plan:  Left Sided Pyelonephritis: Changed IV Ceftriaxone  to IV Ertapenem given Sensitivities.  Will continue for now.  Urinalysis showed turbid appearance and was not able to be tested but did show no bacteria and greater than 50 RBCs per high-power field greater than 50 WBCs.  Urine culture done and showed ESBL E. coli.  CT scan of the abdomen and pelvis with contrast done and showed left perinephric fat stranding which was worrisome for pyelonephritis; He has a Hx of multiple scrotal abscesses so may need ID evaluation to help determine length of treatment  Complex left renal mass: CT scan also showed expansile low-attenuation area in the superior pole of the left kidney has decreased in size compared to 2022 but there is new surrounding wall thickening and mild internal debris which is intra-abdominal.  Findings could represent a complex cyst or infected cyst.   Recommendation was for further evaluation with ultrasound or MRI so we obtained an ultrasound renal which showed complex cystic left renal lesion measuring up to 5.4 cm with thick peripheral vascular wall and trace internal echoes, concerning for a complex cystic renal mass requiring further characterization with contrast-enhanced MRI. Differential diagnosis includes abscess versus malignancy.  Discussed with Nephrology about getting an MRI with contrast to further characterize his renal mass versus cyst and they feel that with the newer gadolinium agents the risk of NSF is nearly 0 so they have cleared the patient to get this MRI w/wo Contrast done and patient to be dialyzed tomorrow. IR consulted for further evaluation and recommending obtaining MRI first prior to next steps   ESRD on HD TThSat: Patient being dialyzed today offschedule. BUN/Cr Trend: Recent Labs  Lab 08/06/24 1651 08/08/24 0514 08/09/24 0505  BUN 58* 76* 41*  CREATININE 18.20* 21.75* 14.07*  -Stop IVF -Avoid Nephrotoxic Medications, Contrast Dyes, Hypotension and Dehydration to Ensure Adequate Renal Perfusion and will need to Renally Adjust Meds. CTM & Trend Renal Function carefully & repeat CMP in the AM  -Further Care per Nephrology and patient is to be dialyzed off cycle tomorrow given that he is getting MRI with and w/o contrast today  Essential HTN: C/w Carvedilol  25 mg po BID, Isosorbide  Mononitrate 30 mg po Daily, and Sacubitril -Valsartan  1 tab po BID. C/w IV Hydralazine  10 mg q4hprn for SBP >170. CTM BP per Protocol. 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 CTM For S/Sx of Volume overload; Volume Maintenance by HD  HLD: C/w Atorvastatin  20 mg po at bedtime  Hx of CVA: C/w ASA 81 mg po Daily and Atorvastatin  20 mg po at bedtime; No longer taking  Coumadin   Normocytic Anemia/Anemia of Chronic Kidney Disease:  Recent Labs  Lab 08/06/24 1651 08/08/24 0514 08/09/24 0505  HGB 9.6* 9.2* 8.9*  HCT 28.9* 27.8* 26.9*  MCV 88.1 89.7 88.5  -Check Anemia Panel in the AM. CTM for S/Sx of Bleeding; No overt bleeding noted. Repeat CBC in the AM   GERD/GI Prophylaxis: Continue PPI w/ Pantoprazole  40 mg po daily   Hypoalbuminemia: Patient's Albumin  Lvl went from 2.7 -> 2.6 -> 2.5. CTM & Trend & repeat CMP in the AM  Class I Obesity: Complicates overall prognosis and care. Estimated body mass index is 30.56 kg/m as calculated from the following:   Height as of 07/06/24: 5' 10.5 (1.791 m).   Weight as of this encounter: 98 kg. Weight Loss and Dietary Counseling given   DVT prophylaxis: heparin  injection 5,000 Units Start: 08/07/24 1530    Code Status: Full Code Family Communication: No family currently at bedside  Disposition Plan:  Level of care: Telemetry Status is: Inpatient Remains inpatient appropriate because: His further clinical improvement and workup of his complex renal mass versus cyst   Consultants:  Nephrology Interventional Radiology Case was discussed with Urology  Procedures:  As delineated as above   Antimicrobials:  Anti-infectives (From admission, onward)    Start     Dose/Rate Route Frequency Ordered Stop   08/08/24 1800  ertapenem (INVANZ) 500 mg in sodium chloride  0.9 % 50 mL IVPB        500 mg 100 mL/hr over 30 Minutes Intravenous Every 24 hours 08/08/24 1313     08/08/24 0600  cefTRIAXone  (ROCEPHIN ) 2 g in sodium chloride  0.9 % 100 mL IVPB  Status:  Discontinued        2 g 200 mL/hr over 30 Minutes Intravenous Every 24 hours 08/07/24 1415 08/08/24 1313   08/06/24 2345  cefTRIAXone  (ROCEPHIN ) 2 g in sodium chloride  0.9 % 100 mL IVPB        2 g 200 mL/hr over 30 Minutes Intravenous  Once 08/06/24 2338 08/07/24 0108       Subjective: Seen and examined at bedside and he is sitting in the chair and  resting.  Feeling little bit better.  No nausea or vomiting.  Thinks his flank pain is improved.  No other concerns or complaints at this time.  Objective: Vitals:   08/08/24 1426 08/08/24 2001 08/09/24 0352 08/09/24 0800  BP: 122/86 114/73 125/88 (!) 133/96  Pulse: 84 82 84 82  Resp: 17   16  Temp: 98.3 F (36.8 C) 98.9 F (37.2 C) 98.4 F (36.9 C) 98.4 F (36.9 C)  TempSrc: Oral Oral Oral   SpO2: 100% 100% 98% 100%  Weight:        Intake/Output Summary (Last 24 hours) at 08/09/2024 1506 Last data filed at 08/09/2024 0140 Gross per 24 hour  Intake 108.67 ml  Output 400 ml  Net -291.33 ml   Filed Weights   08/08/24 0805 08/08/24 1205  Weight: 98 kg 98 kg   Examination: Physical Exam:  Constitutional: WN/WD obese chronically ill-appearing older appearing than his stated age African-American male in no acute distress Respiratory: Diminished to auscultation bilaterally, no  wheezing, rales, rhonchi or crackles. Normal respiratory effort and patient is not tachypenic. No accessory muscle use.  Unlabored breathing Cardiovascular: RRR, no murmurs / rubs / gallops. S1 and S2 auscultated.  Mild extremity edema Abdomen: Soft, non-tender, distended secondary to body habitus. Bowel sounds positive.  GU: Deferred. Musculoskeletal: No clubbing / cyanosis of digits/nails. No joint deformity upper and lower extremities.  Has a right TDC Skin: No rashes, lesions, ulcers on a limited skin evaluation. No induration; Warm and dry.  Neurologic: CN 2-12 grossly intact with no focal deficits. Romberg sign and cerebellar reflexes not assessed.  Psychiatric: Normal judgment and insight. Alert and oriented x 3. Normal mood and appropriate affect.   Data Reviewed: I have personally reviewed following labs and imaging studies  CBC: Recent Labs  Lab 08/06/24 1651 08/08/24 0514 08/09/24 0505  WBC 8.3 6.6 5.7  NEUTROABS  --   --  2.9  HGB 9.6* 9.2* 8.9*  HCT 28.9* 27.8* 26.9*  MCV 88.1 89.7  88.5  PLT 244 232 222   Basic Metabolic Panel: Recent Labs  Lab 08/06/24 1651 08/08/24 0514 08/09/24 0505  NA 140 140 135  K 3.5 4.1 3.8  CL 97* 98 96*  CO2 24 23 26   GLUCOSE 134* 95 88  BUN 58* 76* 41*  CREATININE 18.20* 21.75* 14.07*  CALCIUM  9.7 8.3* 8.7*  MG  --   --  2.0  PHOS  --   --  7.5*   GFR: Estimated Creatinine Clearance: 8.3 mL/min (A) (by C-G formula based on SCr of 14.07 mg/dL (H)). Liver Function Tests: Recent Labs  Lab 08/07/24 1503 08/08/24 0514 08/09/24 0505  AST 11* <10* 11*  ALT 12 11 12   ALKPHOS 97 96 98  BILITOT 0.9 0.6 0.6  PROT 6.9 6.5 6.5  ALBUMIN  2.7* 2.6* 2.5*   No results for input(s): LIPASE, AMYLASE in the last 168 hours. No results for input(s): AMMONIA in the last 168 hours. Coagulation Profile: No results for input(s): INR, PROTIME in the last 168 hours. Cardiac Enzymes: No results for input(s): CKTOTAL, CKMB, CKMBINDEX, TROPONINI in the last 168 hours. BNP (last 3 results) No results for input(s): PROBNP in the last 8760 hours. HbA1C: No results for input(s): HGBA1C in the last 72 hours. CBG: No results for input(s): GLUCAP in the last 168 hours. Lipid Profile: No results for input(s): CHOL, HDL, LDLCALC, TRIG, CHOLHDL, LDLDIRECT in the last 72 hours. Thyroid Function Tests: No results for input(s): TSH, T4TOTAL, FREET4, T3FREE, THYROIDAB in the last 72 hours. Anemia Panel: Recent Labs    08/09/24 0505  VITAMINB12 330  FOLATE 5.7*  FERRITIN 91  TIBC 147*  IRON  56  RETICCTPCT 1.0   Sepsis Labs: Recent Labs  Lab 08/07/24 1503  LATICACIDVEN 1.1   Recent Results (from the past 240 hours)  Urine Culture     Status: Abnormal   Collection Time: 08/06/24  4:52 PM   Specimen: Urine, Clean Catch  Result Value Ref Range Status   Specimen Description   Final    URINE, CLEAN CATCH Performed at [facility], [address], [place], [place] [address]     Special Requests   Final    NONE Performed at [facility], [address], [place], [place] [address]    Culture (A)  Final    >=100,000 COLONIES/mL ESCHERICHIA COLI Confirmed Extended Spectrum Beta-Lactamase Producer (ESBL).  In bloodstream infections from ESBL organisms, carbapenems are preferred over piperacillin /tazobactam. They are shown to have a lower  risk of mortality.    Report Status 08/08/2024 FINAL  Final   Organism ID, Bacteria ESCHERICHIA COLI (A)  Final      Susceptibility   Escherichia coli - MIC*    AMPICILLIN >=32 RESISTANT Resistant     CEFAZOLIN  (URINE) Value in next row Resistant      >=32 RESISTANTThis is a modified FDA-approved test that has been validated and its performance characteristics determined by the reporting laboratory.  This laboratory is certified under the Clinical Laboratory Improvement Amendments CLIA as qualified to perform high complexity clinical laboratory testing.    CEFEPIME Value in next row Sensitive      >=32 RESISTANTThis is a modified FDA-approved test that has been validated and its performance characteristics determined by the reporting laboratory.  This laboratory is certified under the Clinical Laboratory Improvement Amendments CLIA as qualified to perform high complexity clinical laboratory testing.    ERTAPENEM Value in next row Sensitive      >=32 RESISTANTThis is a modified FDA-approved test that has been validated and its performance characteristics determined by the reporting laboratory.  This laboratory is certified under the Clinical Laboratory Improvement Amendments CLIA as qualified to perform high complexity clinical laboratory testing.    CEFTRIAXONE  Value in next row Resistant      >=32 RESISTANTThis is a modified FDA-approved test that has been validated and its performance characteristics determined by the reporting laboratory.  This laboratory is certified under the Clinical Laboratory Improvement  Amendments CLIA as qualified to perform high complexity clinical laboratory testing.    CIPROFLOXACIN  Value in next row Resistant      >=32 RESISTANTThis is a modified FDA-approved test that has been validated and its performance characteristics determined by the reporting laboratory.  This laboratory is certified under the Clinical Laboratory Improvement Amendments CLIA as qualified to perform high complexity clinical laboratory testing.    GENTAMICIN Value in next row Sensitive      >=32 RESISTANTThis is a modified FDA-approved test that has been validated and its performance characteristics determined by the reporting laboratory.  This laboratory is certified under the Clinical Laboratory Improvement Amendments CLIA as qualified to perform high complexity clinical laboratory testing.    NITROFURANTOIN Value in next row Sensitive      >=32 RESISTANTThis is a modified FDA-approved test that has been validated and its performance characteristics determined by the reporting laboratory.  This laboratory is certified under the Clinical Laboratory Improvement Amendments CLIA as qualified to perform high complexity clinical laboratory testing.    TRIMETH/SULFA Value in next row Resistant      >=32 RESISTANTThis is a modified FDA-approved test that has been validated and its performance characteristics determined by the reporting laboratory.  This laboratory is certified under the Clinical Laboratory Improvement Amendments CLIA as qualified to perform high complexity clinical laboratory testing.    AMPICILLIN/SULBACTAM Value in next row Sensitive      >=32 RESISTANTThis is a modified FDA-approved test that has been validated and its performance characteristics determined by the reporting laboratory.  This laboratory is certified under the Clinical Laboratory Improvement Amendments CLIA as qualified to perform high complexity clinical laboratory testing.    PIP/TAZO Value in next row Sensitive      <=4  SENSITIVEThis is a modified FDA-approved test that has been validated and its performance characteristics determined by the reporting laboratory.  This laboratory is certified under the Clinical Laboratory Improvement Amendments CLIA as qualified to perform high complexity clinical laboratory testing.    MEROPENEM Value  in next row Sensitive      <=4 SENSITIVEThis is a modified FDA-approved test that has been validated and its performance characteristics determined by the reporting laboratory.  This laboratory is certified under the Clinical Laboratory Improvement Amendments CLIA as qualified to perform high complexity clinical laboratory testing.    * >=100,000 COLONIES/mL ESCHERICHIA COLI    Radiology Studies: US  Renal Result Date: 08/07/2024 EXAM: US  Retroperitoneum Complete, Renal. 08/07/2024 06:11:15 PM TECHNIQUE: Real-time ultrasonography of the retroperitoneum renal was performed. COMPARISON: US  Renal 11/21/2015. CT Abdomen and Pelvis 08/06/2024. CLINICAL HISTORY: Renal cyst. FINDINGS: FINDINGS: RIGHT KIDNEY/URETER: Right kidney measures 7.5 x 3.5 x 4.7 cm. Normal cortical echogenicity. No hydronephrosis. No calculus. Simple renal cysts. LEFT KIDNEY/URETER: Left kidney measures 11.9 x 6.3 x 4.6 cm. Normal cortical echogenicity. No hydronephrosis. No calculus. Simple renal cysts. There is a 5.3 x 5 x 5.4 cm mass complex cystic left renal lesion with associated thick peripheral vascular wall and trace internal echoes. This lesion measures up to 5.4 cm. BLADDER: Unremarkable appearance of the bladder. IMPRESSION: 1. Complex cystic left renal lesion measuring up to 5.4 cm with thick peripheral vascular wall and trace internal echoes, concerning for a complex cystic renal mass requiring further characterization with contrast-enhanced MRI. Differential diagnosis includes abscess versus malignancy. Electronically signed by: Morgane Naveau MD 08/07/2024 06:45 PM EST RP Workstation: [id number]   Scheduled  Meds:  aspirin  EC  81 mg Oral Daily   atorvastatin   20 mg Oral Daily   carvedilol   25 mg Oral BID WC   Chlorhexidine  Gluconate Cloth  6 each Topical Q0600   heparin   5,000 Units Subcutaneous Q8H   isosorbide  mononitrate  30 mg Oral Daily   pantoprazole   40 mg Oral Daily   sacubitril -valsartan   1 tablet Oral BID   sevelamer carbonate  800 mg Oral TID WC   sodium chloride  flush  3 mL Intravenous Q12H   Continuous Infusions:  ertapenem 100 mL/hr at 08/08/24 1819    LOS: 2 days   Alejandro Marker, DO [facility] Available via Epic secure chat 7am-7pm After these hours, please refer to coverage provider listed on [email or web address] 08/09/2024, 3:06 PM

## 2024-08-09 NOTE — Plan of Care (Signed)

## 2024-08-10 DIAGNOSIS — B9620 Unspecified Escherichia coli [E. coli] as the cause of diseases classified elsewhere: Secondary | ICD-10-CM

## 2024-08-10 DIAGNOSIS — N10 Acute tubulo-interstitial nephritis: Secondary | ICD-10-CM

## 2024-08-10 DIAGNOSIS — L45 Papulosquamous disorders in diseases classified elsewhere: Secondary | ICD-10-CM | POA: Diagnosis not present

## 2024-08-10 DIAGNOSIS — Z1612 Extended spectrum beta lactamase (ESBL) resistance: Secondary | ICD-10-CM

## 2024-08-10 DIAGNOSIS — N492 Inflammatory disorders of scrotum: Secondary | ICD-10-CM

## 2024-08-10 LAB — COMPREHENSIVE METABOLIC PANEL WITH GFR
ALT: 11 U/L (ref 0–44)
AST: 13 U/L — ABNORMAL LOW (ref 15–41)
Albumin: 2.6 g/dL — ABNORMAL LOW (ref 3.5–5.0)
Alkaline Phosphatase: 97 U/L (ref 38–126)
Anion gap: 18 — ABNORMAL HIGH (ref 5–15)
BUN: 49 mg/dL — ABNORMAL HIGH (ref 6–20)
CO2: 23 mmol/L (ref 22–32)
Calcium: 8.4 mg/dL — ABNORMAL LOW (ref 8.9–10.3)
Chloride: 98 mmol/L (ref 98–111)
Creatinine, Ser: 16.52 mg/dL — ABNORMAL HIGH (ref 0.61–1.24)
GFR, Estimated: 3 mL/min — ABNORMAL LOW (ref >60)
Glucose, Bld: 81 mg/dL (ref 70–99)
Potassium: 3.9 mmol/L (ref 3.5–5.1)
Sodium: 139 mmol/L (ref 135–145)
Total Bilirubin: 0.4 mg/dL (ref 0.0–1.2)
Total Protein: 6.5 g/dL (ref 6.5–8.1)

## 2024-08-10 LAB — CBC WITH DIFFERENTIAL/PLATELET
Abs Immature Granulocytes: 0.01 K/uL (ref 0.00–0.07)
Basophils Absolute: 0 K/uL (ref 0.0–0.1)
Basophils Relative: 0 %
Eosinophils Absolute: 0.1 K/uL (ref 0.0–0.5)
Eosinophils Relative: 2 %
HCT: 27.6 % — ABNORMAL LOW (ref 39.0–52.0)
Hemoglobin: 9 g/dL — ABNORMAL LOW (ref 13.0–17.0)
Immature Granulocytes: 0 %
Lymphocytes Relative: 42 %
Lymphs Abs: 2.6 K/uL (ref 0.7–4.0)
MCH: 29.1 pg (ref 26.0–34.0)
MCHC: 32.6 g/dL (ref 30.0–36.0)
MCV: 89.3 fL (ref 80.0–100.0)
Monocytes Absolute: 0.5 K/uL (ref 0.1–1.0)
Monocytes Relative: 8 %
Neutro Abs: 3 K/uL (ref 1.7–7.7)
Neutrophils Relative %: 48 %
Platelets: 238 K/uL (ref 150–400)
RBC: 3.09 MIL/uL — ABNORMAL LOW (ref 4.22–5.81)
RDW: 13.9 % (ref 11.5–15.5)
WBC: 6.3 K/uL (ref 4.0–10.5)
nRBC: 0 % (ref 0.0–0.2)

## 2024-08-10 LAB — MAGNESIUM: Magnesium: 2.2 mg/dL (ref 1.7–2.4)

## 2024-08-10 LAB — PHOSPHORUS: Phosphorus: 7.5 mg/dL — ABNORMAL HIGH (ref 2.5–4.6)

## 2024-08-10 MED ORDER — PENTAFLUOROPROP-TETRAFLUOROETH EX AERO: 1.0000 | INHALATION_SPRAY | CUTANEOUS | Status: DC | PRN

## 2024-08-10 MED ORDER — SEVELAMER CARBONATE 800 MG PO TABS: 800.0000 mg | ORAL_TABLET | Freq: Three times a day (TID) | ORAL | 0 refills | Status: AC

## 2024-08-10 MED ORDER — HEPARIN SODIUM (PORCINE) 1000 UNIT/ML IJ SOLN
INTRAMUSCULAR | Status: AC
  Filled 2024-08-10: qty 6

## 2024-08-10 MED ORDER — HEPARIN SODIUM (PORCINE) 1000 UNIT/ML DIALYSIS: 1000.0000 [IU] | INTRAMUSCULAR | Status: DC | PRN

## 2024-08-10 MED ORDER — HEPARIN SODIUM (PORCINE) 1000 UNIT/ML IJ SOLN
INTRAMUSCULAR | Status: AC
Start: 2024-08-10 — End: 2024-08-10
  Filled 2024-08-10: qty 6

## 2024-08-10 MED ORDER — PROCHLORPERAZINE EDISYLATE 10 MG/2ML IJ SOLN
10.0000 mg | Freq: Four times a day (QID) | INTRAMUSCULAR | Status: DC | PRN
  Administered 2024-08-10: 10 mg via INTRAVENOUS
  Filled 2024-08-10 (×2): qty 2

## 2024-08-10 MED ORDER — ERTAPENEM IV (FOR PTA / DISCHARGE USE ONLY): 1.0000 g | INTRAVENOUS | Status: AC

## 2024-08-10 MED ORDER — ALTEPLASE 2 MG IJ SOLR: 2.0000 mg | Freq: Once | INTRAMUSCULAR | Status: DC | PRN

## 2024-08-10 MED ORDER — LIDOCAINE HCL (PF) 1 % IJ SOLN: 5.0000 mL | INTRAMUSCULAR | Status: DC | PRN

## 2024-08-10 MED ORDER — SODIUM CHLORIDE 0.9 % IV SOLN: 500.0000 mg | INTRAVENOUS | Status: DC

## 2024-08-10 MED ORDER — ANTICOAGULANT SODIUM CITRATE 4% (200MG/5ML) IV SOLN: 5.0000 mL | Status: DC | PRN

## 2024-08-10 MED ORDER — ACETAMINOPHEN 325 MG PO TABS: 650.0000 mg | ORAL_TABLET | Freq: Four times a day (QID) | ORAL | 0 refills | Status: DC | PRN

## 2024-08-10 MED ORDER — HEPARIN SODIUM (PORCINE) 1000 UNIT/ML DIALYSIS: 20.0000 [IU]/kg | INTRAMUSCULAR | Status: DC | PRN

## 2024-08-10 MED ORDER — LIDOCAINE-PRILOCAINE 2.5-2.5 % EX CREA: 1.0000 | TOPICAL_CREAM | CUTANEOUS | Status: DC | PRN

## 2024-08-10 NOTE — Progress Notes (Signed)
 Discharge summary (AVS) provided to pt with instructions. Pt verbalized understanding of instructions.NO complaints. Pt d/c to home as ordered. He remains alert/oriented in no apparent distress, PIV to left hand removed prior to d/c. Pt's wheeled down to main entrance and a family member is responsible for his transport.

## 2024-08-10 NOTE — Progress Notes (Signed)
 PHARMACY CONSULT NOTE FOR:  OUTPATIENT  PARENTERAL ANTIBIOTIC THERAPY with Dialysis   Indication: Pyelonephritis  Regimen: Ertapenem  1 gm with HD TTHSa End date: 08/17/24  No formal OPAT will be done as patient will receive antibiotics with HD. Nephrology aware. Informational order placed     Thank you for allowing pharmacy to be a part of this patient's care.  Damien Quiet, PharmD, BCPS, BCIDP Infectious Diseases Clinical Pharmacist Phone: [phone] 08/10/2024, 1:58 PM

## 2024-08-10 NOTE — Progress Notes (Signed)
 0 ultrafiltration, condition stable , and report was given to the primary RN.

## 2024-08-10 NOTE — Consult Note (Signed)
 [facility]    Date of Admission:  08/06/2024     Total days of antibiotics 5               Reason for Consult: Pyelonephritis     Referring Provider: Dr. Alejandro Marker  Primary Care Provider: Jerrell Cleatus Ned, MD   ASSESSMENT:  Zachary Lynch is a 38 year old with end-stage renal disease on hemodialysis presenting with discolored urine, dysuria, and urine sediment and found to have  imaging suspicious for pyelonephritis with urine culture growing ESBL E. coli.  Initially started on ceftriaxone  and transition to ertapenem  and currently on day 3 of ertapenem  treatment.  Discussed recommended plan of care to continue with current dose of ertapenem  1 g with hemodialysis for a total of 7 more days with end date of 08/17/2024.  Antibiotic therapy has been arranged with nephrology.   He also subsequently discussed lesions/abscesses in the scrotum at times but come and go approximately 1-2 times per year which are possibly concerning for hidradenitis and would recommend follow-up with dermatology for additional evaluation as outpatient.  Okay for discharge from the standpoint.  No additional follow-up with ID needed.Contact precautions were as below E. coli.  Remaining medical and supportive care per internal medicine.  PLAN:  Continue current dose of ertapenem  with hemodialysis through 08/17/2024 Outpatient treatment arranged with nephrology. Recommend follow-up with dermatology for possible hidradenitis Contact precautions for ESBL organism Okay for discharge from ID standpoint with no follow-up necessary Remaining medical and supportive care per internal medicine.   Principal Problem:   Pyelonephritis of left kidney    aspirin  EC  81 mg Oral Daily   atorvastatin   20 mg Oral Daily   carvedilol   25 mg Oral BID WC   Chlorhexidine  Gluconate Cloth  6 each Topical Q0600   heparin   5,000 Units Subcutaneous Q8H   isosorbide  mononitrate  30 mg Oral Daily    pantoprazole   40 mg Oral Daily   sacubitril -valsartan   1 tablet Oral BID   sevelamer  carbonate  800 mg Oral TID WC   sodium chloride  flush  3 mL Intravenous Q12H     HPI: Zachary Lynch is a 38 y.o. male with previous medical history of congestive heart failure, ESRD on hemodialysis, and hypertension presenting to the hospital with dark urine, sediment, and dysuria.  Zachary Lynch presented with acute onset dark urine, sediment in urine, and left flank pain. Initially afebrile with no leukocytosis and WBC count of 8300. Urine was turbid and unable to complete full UA secondary to color with >50 WBC count noted. Started on ceftriaxone .  CT abdomen/pelvis with left perinephric fat stranding worrisome for pyelonephritis.  An expansile low attenuation area of the superior pole of the left kidney decreased in size compared to previous imaging likely representing a complex cyst.  Renal ultrasound with complex cystic left renal lesion concerning for cystic renal mass.  MRI abdomen with large minimally complex cyst in the right upper pole of the left kidney mildly decreased since 2017 favoring benign cyst without evidence of superinfection.  Urine culture positive for ESBL E. coli.  Zachary Lynch is currently on day 5 of antimicrobial therapy.  Symptoms have improved tolerating antibiotics with no adverse side effects.  Has had some recent lesions on his scrotum that come and go.   Review of Systems: Review of Systems  Constitutional:  Negative for chills, fever and weight loss.  Respiratory:  Negative for cough, shortness of breath and  wheezing.   Cardiovascular:  Negative for chest pain and leg swelling.  Gastrointestinal:  Negative for abdominal pain, constipation, diarrhea, nausea and vomiting.  Skin:  Negative for rash.     Past Medical History:  Diagnosis Date   Arthritis    CKD stage 4 secondary to hypertension (HCC) 03/28/2017   Elevated blood uric acid level    Essential hypertension     Folliculitis    Managed with doxycycline , topical clindamycin /benzoyl peroxide   GERD (gastroesophageal reflux disease)    Gout    HFrEF (heart failure with reduced ejection fraction) (HCC)    Echo 2019 with EF 30-35%, hypokinesis   History of cardiomyopathy    LVEF 15% in 2010 - evaluated [facility] and Dr. Fernande   History of stroke 2010   Right MCA distribution infarct, felt to be possibly embolic in the setting of cardiomyopathy - placed on Coumadin at that time   Hyperlipidemia    Nonischemic cardiomyopathy (HCC) 03/28/2017   Obesity    Prediabetes 05/03/2017    Social History   Tobacco Use   Smoking status: Never   Smokeless tobacco: Never  Vaping Use   Vaping status: Never Used  Substance Use Topics   Alcohol use: No    Alcohol/week: 0.0 standard drinks of alcohol   Drug use: No    Family History  Problem Relation Age of Onset   Stroke Cousin    Hypertension Mother    Heart attack Mother        Died age 53   Hypertension Father     Allergies  Allergen Reactions   Albuterol Other (See Comments)    Reaction is unknown. Allergy was entered post Stroke caused him to have a stroke   Tramadol  Nausea And Vomiting    Disorientation    OBJECTIVE: Blood pressure 118/80, pulse 84, temperature 98.7 F (37.1 C), temperature source Oral, resp. rate 18, weight 96.4 kg, SpO2 100%.  Physical Exam Constitutional:      General: He is not in acute distress.    Appearance: He is well-developed.  Cardiovascular:     Rate and Rhythm: Normal rate and regular rhythm.     Heart sounds: Normal heart sounds.  Pulmonary:     Effort: Pulmonary effort is normal.     Breath sounds: Normal breath sounds.  Skin:    General: Skin is warm and dry.  Neurological:     Mental Status: He is alert and oriented to person, place, and time.     Lab Results Lab Results  Component Value Date   WBC 6.3 08/10/2024   HGB 9.0 (L) 08/10/2024   HCT 27.6 (L) 08/10/2024   MCV 89.3 08/10/2024    PLT 238 08/10/2024    Lab Results  Component Value Date   CREATININE 16.52 (H) 08/10/2024   BUN 49 (H) 08/10/2024   NA 139 08/10/2024   K 3.9 08/10/2024   CL 98 08/10/2024   CO2 23 08/10/2024    Lab Results  Component Value Date   ALT 11 08/10/2024   AST 13 (L) 08/10/2024   ALKPHOS 97 08/10/2024   BILITOT 0.4 08/10/2024     Microbiology: Recent Results (from the past 240 hours)  Urine Culture     Status: Abnormal   Collection Time: 08/06/24  4:52 PM   Specimen: Urine, Clean Catch  Result Value Ref Range Status   Specimen Description   Final    URINE, CLEAN CATCH Performed at [facility], [phone]  [address] [place], [place] [address]    Special Requests   Final    NONE Performed at [facility], [address], [place], [place] [address]    Culture (A)  Final    >=100,000 COLONIES/mL ESCHERICHIA COLI Confirmed Extended Spectrum Beta-Lactamase Producer (ESBL).  In bloodstream infections from ESBL organisms, carbapenems are preferred over piperacillin /tazobactam. They are shown to have a lower risk of mortality.    Report Status 08/08/2024 FINAL  Final   Organism ID, Bacteria ESCHERICHIA COLI (A)  Final      Susceptibility   Escherichia coli - MIC*    AMPICILLIN >=32 RESISTANT Resistant     CEFAZOLIN  (URINE) Value in next row Resistant      >=32 RESISTANTThis is a modified FDA-approved test that has been validated and its performance characteristics determined by the reporting laboratory.  This laboratory is certified under the Clinical Laboratory Improvement Amendments CLIA as qualified to perform high complexity clinical laboratory testing.    CEFEPIME Value in next row Sensitive      >=32 RESISTANTThis is a modified FDA-approved test that has been validated and its performance characteristics determined by the reporting laboratory.  This laboratory is certified under the Clinical Laboratory Improvement Amendments CLIA as qualified  to perform high complexity clinical laboratory testing.    ERTAPENEM  Value in next row Sensitive      >=32 RESISTANTThis is a modified FDA-approved test that has been validated and its performance characteristics determined by the reporting laboratory.  This laboratory is certified under the Clinical Laboratory Improvement Amendments CLIA as qualified to perform high complexity clinical laboratory testing.    CEFTRIAXONE  Value in next row Resistant      >=32 RESISTANTThis is a modified FDA-approved test that has been validated and its performance characteristics determined by the reporting laboratory.  This laboratory is certified under the Clinical Laboratory Improvement Amendments CLIA as qualified to perform high complexity clinical laboratory testing.    CIPROFLOXACIN  Value in next row Resistant      >=32 RESISTANTThis is a modified FDA-approved test that has been validated and its performance characteristics determined by the reporting laboratory.  This laboratory is certified under the Clinical Laboratory Improvement Amendments CLIA as qualified to perform high complexity clinical laboratory testing.    GENTAMICIN Value in next row Sensitive      >=32 RESISTANTThis is a modified FDA-approved test that has been validated and its performance characteristics determined by the reporting laboratory.  This laboratory is certified under the Clinical Laboratory Improvement Amendments CLIA as qualified to perform high complexity clinical laboratory testing.    NITROFURANTOIN Value in next row Sensitive      >=32 RESISTANTThis is a modified FDA-approved test that has been validated and its performance characteristics determined by the reporting laboratory.  This laboratory is certified under the Clinical Laboratory Improvement Amendments CLIA as qualified to perform high complexity clinical laboratory testing.    TRIMETH/SULFA Value in next row Resistant      >=32 RESISTANTThis is a modified FDA-approved  test that has been validated and its performance characteristics determined by the reporting laboratory.  This laboratory is certified under the Clinical Laboratory Improvement Amendments CLIA as qualified to perform high complexity clinical laboratory testing.    AMPICILLIN/SULBACTAM Value in next row Sensitive      >=32 RESISTANTThis is a modified FDA-approved test that has been validated and its performance characteristics determined by the reporting laboratory.  This laboratory is certified under the Clinical Laboratory Improvement Amendments CLIA as  qualified to perform high complexity clinical laboratory testing.    PIP/TAZO Value in next row Sensitive      <=4 SENSITIVEThis is a modified FDA-approved test that has been validated and its performance characteristics determined by the reporting laboratory.  This laboratory is certified under the Clinical Laboratory Improvement Amendments CLIA as qualified to perform high complexity clinical laboratory testing.    MEROPENEM Value in next row Sensitive      <=4 SENSITIVEThis is a modified FDA-approved test that has been validated and its performance characteristics determined by the reporting laboratory.  This laboratory is certified under the Clinical Laboratory Improvement Amendments CLIA as qualified to perform high complexity clinical laboratory testing.    * >=100,000 COLONIES/mL ESCHERICHIA COLI     Cathlyn July, NP [facility]  08/10/2024  2:56 PM

## 2024-08-10 NOTE — Progress Notes (Addendum)
 Contacted by [facility], iv abx have arrived at clinic. Will continue to assist as needed.   Lavanda Lateefa Crosby Dialysis Navigator [phone]  D/c orders noted. Will fax d/c summ and last neph note once available

## 2024-08-10 NOTE — Progress Notes (Signed)
 This is a patient with PMH significant for HTN, HF, and ESRD (HD) who presented to the ED for flank pain. CT obtained as part of workup revealed left complex renal mass vs cyst. IR was consulted and recommended MRI which the team coordinated.  Patient was approved by Dr. Philip for CT renal cyst aspiration in IR today. However, the patient has reported that he is unwilling to stay for this procedure and would like scheduled outpatient. IP team aware and will place IR eval order with DC orders. Will message schedulers as well.    Shandrika Ambers NP 08/10/2024 12:52 PM

## 2024-08-10 NOTE — Progress Notes (Addendum)
  [facility] Progress Note   Subjective:   Seen in [facility]. On dialysis. No complaints this am. Denies cp, sob   Objective Vitals:   08/10/24 0742 08/10/24 0802 08/10/24 0830 08/10/24 0900  BP: 129/87 (!) 120/102 (!) 127/90 (!) 113/50  Pulse: 73 74 81 78  Resp: 19 14 (!) 24 16  Temp: 98.1 F (36.7 C)     TempSrc:      SpO2: 100% 100% 98% 100%  Weight: 96.4 kg       Additional Objective Labs: Basic Metabolic Panel: Recent Labs  Lab 08/08/24 0514 08/09/24 0505 08/10/24 0445  NA 140 135 139  K 4.1 3.8 3.9  CL 98 96* 98  CO2 23 26 23   GLUCOSE 95 88 81  BUN 76* 41* 49*  CREATININE 21.75* 14.07* 16.52*  CALCIUM  8.3* 8.7* 8.4*  PHOS  --  7.5* 7.5*   CBC: Recent Labs  Lab 08/06/24 1651 08/08/24 0514 08/09/24 0505 08/10/24 0445  WBC 8.3 6.6 5.7 6.3  NEUTROABS  --   --  2.9 3.0  HGB 9.6* 9.2* 8.9* 9.0*  HCT 28.9* 27.8* 26.9* 27.6*  MCV 88.1 89.7 88.5 89.3  PLT 244 232 222 238   Blood Culture    Component Value Date/Time   SDES  08/06/2024 1652    URINE, CLEAN CATCH Performed at [facility], [address], [place], [place] [address]    [facility]  08/06/2024 1652    NONE Performed at [facility], [address], [place], [place] [address]    CULT (A) 08/06/2024 1652    >=100,000 COLONIES/mL ESCHERICHIA COLI Confirmed Extended Spectrum Beta-Lactamase Producer (ESBL).  In bloodstream infections from ESBL organisms, carbapenems are preferred over piperacillin /tazobactam. They are shown to have a lower risk of mortality.    REPTSTATUS 08/08/2024 FINAL 08/06/2024 1652   Physical Exam General: Alert, nad Heart: RRR Lungs: Normal wob Abdomen: non-tender Extremities: no LE edema  Dialysis Access: TDC   Medications:  ertapenem  500 mg (08/09/24 2223)    aspirin  EC  81 mg Oral Daily   atorvastatin   20 mg Oral Daily   carvedilol   25 mg Oral BID WC   Chlorhexidine  Gluconate Cloth  6 each Topical Q0600    heparin   5,000 Units Subcutaneous Q8H   isosorbide  mononitrate  30 mg Oral Daily   pantoprazole   40 mg Oral Daily   sacubitril -valsartan   1 tablet Oral BID   sevelamer  carbonate  800 mg Oral TID WC   sodium chloride  flush  3 mL Intravenous Q12H    Dialysis Orders:  [facility]  4H 450/600 EDW 97.5kg 2K/2.5Ca  TDC Heparin  1000 bolus + 1000 u/hr -Mircera 50 q 4 weeks (last 10/30) -Cinacalcet 90 mg TIW  Assessment/Plan: ESBL E. Coli UTI/pyelonephritis. Receiving IV ertapenem . Will need to clarify duration of treatment for antibiotics.  ADDEN: Seen by ID Continue Ertapenem  1 g with HD until 08/17/24 ESRD: on HD TTS. HD off schedule this week. Had HD Wed. HD today  HTN:  BP acceptable. Continue home meds.  Volume: no vol excess  Anemia. Hgb 9s. On ESA as outpatient. Follow trends.  2HPTH. Ca acceptable.Add Renevla binder for hyperphos.  HFrEF 40-45%. Appears euvolemic. On Entresto .  Complex L renal cyst. MRI suggest benign cyst   Maisie Ronnald Acosta PA-C  [facility] 08/10/2024,9:09 AM

## 2024-08-11 NOTE — Discharge Summary (Addendum)
 Physician Discharge Summary   Patient: Zachary Lynch MRN: [id number] DOB: May 17, 1986  Admit date:     08/06/2024  Discharge date: 08/10/2024  Discharge Physician: Alejandro Marker, DO   PCP: Jerrell Cleatus Ned, MD   Recommendations at discharge:   Follow-up with PCP within 1 to 2 weeks repeat CBC, CMP, mag, Phos within 1 week Follow-up with nephrology in outpatient setting within 1 to 2 weeks and continue maintenance of hemodialysis on regular dialysis schedule Follow-up with IR in outpatient setting within 1 to 2 weeks Follow-up with infectious disease in outpatient setting and continue ertapenem  for 10 days Follow-up with dermatology in outpatient setting for scrotal processes  Discharge Diagnoses: Principal Problem:   Pyelonephritis of left kidney  Resolved Problems:   * No resolved hospital problems. [facility] Course: The patient is a 38 year old obese African-American male with a past medical history significant for but not limited to essential hypertension, heart failure with reduced ejection fraction, history of CVA, ESRD on HD who presented with left flank pain and changes in his urine.  CT of the abdomen pelvis was done and showed findings of left-sided perinephric stranding concerning for pyelonephritis as well as a complex infected mass of the left kidney.  He was initiated on IV ceftriaxone  and given pain control with morphine  and Zofran .  Cultures came back with ESBL E. coli so antibiotics were changed to IV ertapenem .  Nephrology was consulted and patient was taken for dialysis 11/19.  We are currently working up his left complex renal mass which is likely a cyst and IR has been consulted.  Nephrology has cleared the patient for a MRI with contrast and he will be dialyzed tomorrow off cycle.  Patient underwent MRI and the IR team was going to aspirate renal cyst however patient was unwilling to stay for this procedure.  They are planning on doing this in the outpatient setting.   Nephrology cleared the patient for discharge and and ID recommended continuing antibiotics for a total of 10 days and 4 more times with dialysis.  He is medically stable for discharge and will need to follow-up with PCP, IR and nephrology in outpatient setting.  The ID doctor do not feel strongly about following up in the ID clinic but recommended outpatient Derm evaluation for scrotal processes.   Assessment and Plan:  Left Sided Pyelonephritis: Changed IV Ceftriaxone  to IV Ertapenem  given Sensitivities.  Will continue for now.  Urinalysis showed turbid appearance and was not able to be tested but did show no bacteria and greater than 50 RBCs per high-power field greater than 50 WBCs.  Urine culture done and showed ESBL E. coli.  CT scan of the abdomen and pelvis with contrast done and showed left perinephric fat stranding which was worrisome for pyelonephritis; He has a Hx of multiple scrotal abscesses so may need ID evaluation to help determine length of treatment.  ID evaluated recommending continuing ertapenem  for 4 more dialysis treatments and a total of 10 days.  He is improved and medically stable for discharge  Complex left renal mass: CT scan also showed expansile low-attenuation area in the superior pole of the left kidney has decreased in size compared to 2022 but there is new surrounding wall thickening and mild internal debris which is intra-abdominal.  Findings could represent a complex cyst or infected cyst.  Recommendation was for further evaluation with ultrasound or MRI so we obtained an ultrasound renal which showed complex cystic left renal lesion measuring up to  5.4 cm with thick peripheral vascular wall and trace internal echoes, concerning for a complex cystic renal mass requiring further characterization with contrast-enhanced MRI. Differential diagnosis includes abscess versus malignancy.  Discussed with Nephrology about getting an MRI with contrast to further characterize his renal  mass versus cyst and they feel that with the newer gadolinium agents the risk of NSF is nearly 0 so they have cleared the patient to get this MRI w/wo Contrast done and patient to be dialyzed tomorrow. IR consulted for further evaluation and recommending obtaining MRI first prior to next steps -MRI done and showed Large minimally complex cyst in the upper pole of the left kidney is mildly decreased in size from CT 2017 with no enhancing elements, favoring a benign cyst without evidence of superinfection.  Bilateral Bosniak 1 renal cysts are present. No follow-up recommended for benign renal lesion - IR team was planning on biopsying this and aspirating however patient refused and wants to do it outpatient.  Referral has been made for IR to be done in the outpatient setting    ESRD on HD TThSat: Patient being dialyzed today offschedule. BUN/Cr Trend: Recent Labs  Lab 08/06/24 1651 08/08/24 0514 08/09/24 0505 08/10/24 0445  BUN 58* 76* 41* 49*  CREATININE 18.20* 21.75* 14.07* 16.52*  -Stop IVF -Avoid Nephrotoxic Medications, Contrast Dyes, Hypotension and Dehydration to Ensure Adequate Renal Perfusion and will need to Renally Adjust Meds. CTM & Trend Renal Function carefully & repeat CMP in the AM  -Underwent dialysis today and is medically stable for discharge  Essential HTN: C/w Carvedilol  25 mg po BID, Isosorbide  Mononitrate 30 mg po Daily, and Sacubitril -Valsartan  1 tab po BID. C/w IV Hydralazine  10 mg q4hprn for SBP >170. CTM BP per Protocol. 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 CTM For S/Sx of Volume overload; Volume Maintenance by HD  HLD: C/w Atorvastatin  20 mg po at bedtime  Hx of CVA: C/w ASA 81 mg po Daily and Atorvastatin  20 mg po at bedtime; No longer taking Coumadin   Scrotal Processes: Derm Evaluation  outpatient   Normocytic Anemia/Anemia of Chronic Kidney Disease:  Recent Labs  Lab 08/06/24 1651 08/08/24 0514 08/09/24 0505 08/10/24 0445  HGB 9.6* 9.2* 8.9* 9.0*  HCT 28.9* 27.8* 26.9* 27.6*  MCV 88.1 89.7 88.5 89.3  -Check Anemia Panel in the AM. CTM for S/Sx of Bleeding; No overt bleeding noted. Repeat CBC in the AM   GERD/GI Prophylaxis: Continue PPI w/ Pantoprazole  40 mg po daily   Hypoalbuminemia: Patient's Albumin  Lvl went from 2.7 -> 2.6 -> 2.5. CTM & Trend & repeat CMP in the AM  Class I Obesity: Complicates overall prognosis and care. Estimated body mass index is 30.06 kg/m as calculated from the following:   Height as of 07/06/24: 5' 10.5 (1.791 m).   Weight as of this encounter: 96.4 kg. Weight Loss and Dietary Counseling given  Consultants: Nephrology, ID, IR Procedures performed: As delineated as above   Disposition: Home Diet recommendation:  Discharge Diet Orders (From admission, onward)     Start     Ordered   08/10/24 0000  Diet - low sodium heart healthy        08/10/24 1325   08/10/24 0000  Diet renal/carb modified with fluid restriction        08/10/24 1325  Renal diet DISCHARGE MEDICATION: Allergies as of 08/10/2024       Reactions   Albuterol Other (See Comments)   Reaction is unknown. Allergy was entered post Stroke caused him to have a stroke   Tramadol  Nausea And Vomiting   Disorientation        Medication List     TAKE these medications    acetaminophen  325 MG tablet Commonly known as: TYLENOL  Take 2 tablets (650 mg total) by mouth every 6 (six) hours as needed for mild pain (pain score 1-3), moderate pain (pain score 4-6), fever or headache (or Fever >/= 101).   Aspirin  Low Dose 81 MG tablet Generic drug: aspirin  EC Take 1 tablet (81 mg total) by mouth daily.   atorvastatin  20 MG tablet Commonly known as: LIPITOR Take 1 tablet (20 mg total) by mouth daily.   carvedilol  25 MG tablet Commonly known as:  COREG  Take 1 tablet (25 mg total) by mouth 2 (two) times daily.   Entresto  24-26 MG Generic drug: sacubitril -valsartan  Take 1 tablet by mouth 2 (two) times daily.   ertapenem  IVPB Commonly known as: INVANZ  Inject 1 g into the vein Every Tuesday,Thursday,and Saturday with dialysis for 6 days. Indication:  Pyelonephritis  First Dose: Yes Last Day of Therapy:  08/17/24  Labs - Once weekly:  CBC/D and BMP,   famotidine  40 MG tablet Commonly known as: PEPCID  Take 40 mg by mouth as needed for heartburn or indigestion.   isosorbide  mononitrate 30 MG 24 hr tablet Commonly known as: IMDUR  Take 1 tablet (30 mg total) by mouth daily.   ondansetron  4 MG disintegrating tablet Commonly known as: ZOFRAN -ODT Take 4 mg by mouth every 8 (eight) hours as needed.   pantoprazole  40 MG tablet Commonly known as: PROTONIX  Take 1 tablet (40 mg total) by mouth daily.   predniSONE  20 MG tablet Commonly known as: DELTASONE  Take 2 tablets (40 mg total) by mouth daily for five days as needed for gout flares What changed: Another medication with the same name was removed. Continue taking this medication, and follow the directions you see here.   sevelamer  carbonate 800 MG tablet Commonly known as: RENVELA  Take 1 tablet (800 mg total) by mouth 3 (three) times daily with meals.               Home Infusion Instuctions  (From admission, onward)           Start     Ordered   08/10/24 0000  Home infusion instructions       Question:  Instructions  Answer:  Flushing of vascular access device: 0.9% NaCl pre/post medication administration and prn patency; Heparin  100 u/ml, 5ml for implanted ports and Heparin  10u/ml, 5ml for all other central venous catheters.   08/10/24 1401            Follow-up Information     Jerrell Cleatus Ned, MD Follow up.   Specialty: Internal Medicine Contact information: [id number] [place] [address] [phone]                Discharge  Exam: Filed Weights   08/08/24 0805 08/08/24 1205 08/10/24 0742  Weight: 98 kg 98 kg 96.4 kg   Vitals:   08/10/24 1140 08/10/24 1424  BP: 109/89 118/80  Pulse: 84   Resp: 19 18  Temp: 98.6 F (37 C) 98.7 F (37.1 C)  SpO2: 100% 100%   Examination: Physical Exam:  Constitutional: WN/WD obese African-American male in no acute  distress Respiratory: Diminished to auscultation bilaterally, no wheezing, rales, rhonchi or crackles. Normal respiratory effort and patient is not tachypenic. No accessory muscle use.  Unlabored breathing Cardiovascular: RRR, no murmurs / rubs / gallops. S1 and S2 auscultated. No extremity edema. Abdomen: Soft, non-tender, slightly distended secondary body habitus.  Bowel sounds positive.  GU: Deferred. Musculoskeletal: No clubbing / cyanosis of digits/nails. No joint deformity upper and lower extremities.  Neurologic: CN 2-12 grossly intact with no focal deficits. Romberg sign and cerebellar reflexes not assessed.  Psychiatric: Normal judgment and insight. Alert and oriented x 3.  Anxious mood and appropriate affect.   Condition at discharge: stable  The results of significant diagnostics from this hospitalization (including imaging, microbiology, ancillary and laboratory) are listed below for reference.   Imaging Studies: MR ABDOMEN W WO CONTRAST Result Date: 08/09/2024 EXAM: MRCP WITH AND WITHOUT IV CONTRAST 08/09/2024 02:03:00 PM TECHNIQUE: Multisequence, multiplanar magnetic resonance images of the abdomen with and without intravenous contrast. MRCP sequences were performed. CONTRAST: 9 mL of Gadavist . COMPARISON: CT 08/06/2024 and CT 07/19/2016. CLINICAL HISTORY: End stage renal disease with pyelonephritis, complex left sided renal lesion. FINDINGS: LIVER: Unremarkable. GALLBLADDER AND BILIARY SYSTEM: Gallbladder is unremarkable. No intrahepatic or extrahepatic ductal dilation. SPLEEN: Unremarkable. PANCREAS/PANCREATIC DUCT: Visualized pancreas is  unremarkable. No pancreatic ductal dilatation. ADRENAL GLANDS: Unremarkable. KIDNEYS: Up pole of the left kidney there is a cystic lesion measuring 5.0 x 4.9 cm which is unchanged in size from recent CT. The lesion is similar in size to prior remote CT where the cyst measures 6.1 x 5.2 cm on 07/19/2016. The cyst is predominantly hyperintense on T2 weighted imaging and hypointense on T1 weighted imaging. There is a fluid fluid level seen on series 6. There is no postcontrast enhancement to the cystic complex (series 1103). There are additional small cysts within left and right kidney which are not enhancing and are hyperintense on T2 weighted imaging, consistent with benign Bosniak 1 renal cysts. LYMPH NODES: No enlarged abdominal lymph nodes. VASCULATURE: Unremarkable. PERITONEUM: No ascites. ABDOMINAL WALL: No hernia. No mass. BOWEL: Grossly unremarkable. No bowel obstruction. BONES: No acute abnormality or worrisome osseous lesion. SOFT TISSUES: Unremarkable. MISCELLANEOUS: Unremarkable. IMPRESSION: 1. Large minimally complex cyst in the upper pole of the left kidney is mildly decreased in size from CT 2017 with no enhancing elements, favoring a benign cyst without evidence of superinfection. 2. Bilateral Bosniak 1 renal cysts are present. No follow-up recommended for benign renal lesion Electronically signed by: Norleen Boxer MD 08/09/2024 03:05 PM EST RP Workstation: [id number]   US  Renal Result Date: 08/07/2024 EXAM: US  Retroperitoneum Complete, Renal. 08/07/2024 06:11:15 PM TECHNIQUE: Real-time ultrasonography of the retroperitoneum renal was performed. COMPARISON: US  Renal 11/21/2015. CT Abdomen and Pelvis 08/06/2024. CLINICAL HISTORY: Renal cyst. FINDINGS: FINDINGS: RIGHT KIDNEY/URETER: Right kidney measures 7.5 x 3.5 x 4.7 cm. Normal cortical echogenicity. No hydronephrosis. No calculus. Simple renal cysts. LEFT KIDNEY/URETER: Left kidney measures 11.9 x 6.3 x 4.6 cm. Normal cortical echogenicity. No  hydronephrosis. No calculus. Simple renal cysts. There is a 5.3 x 5 x 5.4 cm mass complex cystic left renal lesion with associated thick peripheral vascular wall and trace internal echoes. This lesion measures up to 5.4 cm. BLADDER: Unremarkable appearance of the bladder. IMPRESSION: 1. Complex cystic left renal lesion measuring up to 5.4 cm with thick peripheral vascular wall and trace internal echoes, concerning for a complex cystic renal mass requiring further characterization with contrast-enhanced MRI. Differential diagnosis includes abscess versus malignancy. Electronically signed by: Morgane Naveau  MD 08/07/2024 06:45 PM EST RP Workstation: [id number]   CT ABDOMEN PELVIS W CONTRAST Result Date: 08/06/2024 CLINICAL DATA:  Left flank pain EXAM: CT ABDOMEN AND PELVIS WITH CONTRAST TECHNIQUE: Multidetector CT imaging of the abdomen and pelvis was performed using the standard protocol following bolus administration of intravenous contrast. RADIATION DOSE REDUCTION: This exam was performed according to the departmental dose-optimization program which includes automated exposure control, adjustment of the mA and/or kV according to patient size and/or use of iterative reconstruction technique. CONTRAST:  85mL OMNIPAQUE  IOHEXOL  300 MG/ML  SOLN COMPARISON:  Pelvis CT 12/06/2023. CT renal stone 07/19/2016. CT chest 10/18/2020. FINDINGS: Lower chest: No acute abnormality. Hepatobiliary: No focal liver abnormality is seen. No gallstones, gallbladder wall thickening, or biliary dilatation. Pancreas: Unremarkable. No pancreatic ductal dilatation or surrounding inflammatory changes. Spleen: Normal in size without focal abnormality. Adrenals/Urinary Tract: There is left perinephric fat stranding. There is a single calculus in the superior pole of the left kidney. There is no hydronephrosis. There is normal renal enhancement bilaterally. There is an expansile oval low-attenuation area in the superior pole of the left  kidney measuring 6.2 x 6.5 x 4.8 cm which has decreased in size compared to 2022. The borders are slightly ragged and thickened and there some questionable mild internal debris or septation along the medial border best seen on coronal image 4/40. On prior examination this appear to be a simple cyst, but wall findings are new from prior. There are additional rounded cortical hypodensities in both kidneys which are too small to characterize, possibly cysts. The adrenal glands are within normal limits. There is bladder wall thickening versus normal under distention. There is no surrounding inflammation. Next Stomach/Bowel: Stomach is within normal limits. Appendix appears normal. No evidence of bowel wall thickening, distention, or inflammatory changes. There scattered colonic diverticula. There is a large amount of stool throughout the colon. Vascular/Lymphatic: Aortic atherosclerosis. No enlarged abdominal or pelvic lymph nodes. Reproductive: Prostate gland is within normal limits. Other: No abdominal wall hernia or abnormality. No abdominopelvic ascites. Musculoskeletal: No acute or significant osseous findings. IMPRESSION: 1. Left perinephric fat stranding worrisome for pyelonephritis. 2. Expansile low-attenuation area in the superior pole of the left kidney has decreased in size compared to 2022, but there is is new surrounding wall thickening and mild internal debris which is indeterminate. Findings may represent a complex cyst or infected cyst. Recommend further evaluation with ultrasound or MRI. 3. Nonobstructing left renal calculus. 4. Bladder wall thickening versus normal under distention. Correlate clinically for cystitis. 5. Colonic diverticulosis. Aortic Atherosclerosis (ICD10-I70.0). Electronically Signed   By: Greig Pique M.D.   On: 08/06/2024 23:34   Microbiology: Results for orders placed or performed during the hospital encounter of 08/06/24  Urine Culture     Status: Abnormal   Collection  Time: 08/06/24  4:52 PM   Specimen: Urine, Clean Catch  Result Value Ref Range Status   Specimen Description   Final    URINE, CLEAN CATCH Performed at [facility], [address] [place], [place] [address]    Special Requests   Final    NONE Performed at [facility], [address], [place], [place] [address]    Culture (A)  Final    >=100,000 COLONIES/mL ESCHERICHIA COLI Confirmed Extended Spectrum Beta-Lactamase Producer (ESBL).  In bloodstream infections from ESBL organisms, carbapenems are preferred over piperacillin /tazobactam. They are shown to have a lower risk of mortality.    Report Status 08/08/2024 FINAL  Final   Organism  ID, Bacteria ESCHERICHIA COLI (A)  Final      Susceptibility   Escherichia coli - MIC*    AMPICILLIN >=32 RESISTANT Resistant     CEFAZOLIN  (URINE) Value in next row Resistant      >=32 RESISTANTThis is a modified FDA-approved test that has been validated and its performance characteristics determined by the reporting laboratory.  This laboratory is certified under the Clinical Laboratory Improvement Amendments CLIA as qualified to perform high complexity clinical laboratory testing.    CEFEPIME Value in next row Sensitive      >=32 RESISTANTThis is a modified FDA-approved test that has been validated and its performance characteristics determined by the reporting laboratory.  This laboratory is certified under the Clinical Laboratory Improvement Amendments CLIA as qualified to perform high complexity clinical laboratory testing.    ERTAPENEM  Value in next row Sensitive      >=32 RESISTANTThis is a modified FDA-approved test that has been validated and its performance characteristics determined by the reporting laboratory.  This laboratory is certified under the Clinical Laboratory Improvement Amendments CLIA as qualified to perform high complexity clinical laboratory testing.    CEFTRIAXONE  Value in next row Resistant       >=32 RESISTANTThis is a modified FDA-approved test that has been validated and its performance characteristics determined by the reporting laboratory.  This laboratory is certified under the Clinical Laboratory Improvement Amendments CLIA as qualified to perform high complexity clinical laboratory testing.    CIPROFLOXACIN  Value in next row Resistant      >=32 RESISTANTThis is a modified FDA-approved test that has been validated and its performance characteristics determined by the reporting laboratory.  This laboratory is certified under the Clinical Laboratory Improvement Amendments CLIA as qualified to perform high complexity clinical laboratory testing.    GENTAMICIN Value in next row Sensitive      >=32 RESISTANTThis is a modified FDA-approved test that has been validated and its performance characteristics determined by the reporting laboratory.  This laboratory is certified under the Clinical Laboratory Improvement Amendments CLIA as qualified to perform high complexity clinical laboratory testing.    NITROFURANTOIN Value in next row Sensitive      >=32 RESISTANTThis is a modified FDA-approved test that has been validated and its performance characteristics determined by the reporting laboratory.  This laboratory is certified under the Clinical Laboratory Improvement Amendments CLIA as qualified to perform high complexity clinical laboratory testing.    TRIMETH/SULFA Value in next row Resistant      >=32 RESISTANTThis is a modified FDA-approved test that has been validated and its performance characteristics determined by the reporting laboratory.  This laboratory is certified under the Clinical Laboratory Improvement Amendments CLIA as qualified to perform high complexity clinical laboratory testing.    AMPICILLIN/SULBACTAM Value in next row Sensitive      >=32 RESISTANTThis is a modified FDA-approved test that has been validated and its performance characteristics determined by the reporting  laboratory.  This laboratory is certified under the Clinical Laboratory Improvement Amendments CLIA as qualified to perform high complexity clinical laboratory testing.    PIP/TAZO Value in next row Sensitive      <=4 SENSITIVEThis is a modified FDA-approved test that has been validated and its performance characteristics determined by the reporting laboratory.  This laboratory is certified under the Clinical Laboratory Improvement Amendments CLIA as qualified to perform high complexity clinical laboratory testing.    MEROPENEM Value in next row Sensitive      <=4 SENSITIVEThis is a modified FDA-approved  test that has been validated and its performance characteristics determined by the reporting laboratory.  This laboratory is certified under the Clinical Laboratory Improvement Amendments CLIA as qualified to perform high complexity clinical laboratory testing.    * >=100,000 COLONIES/mL ESCHERICHIA COLI   Labs: CBC: Recent Labs  Lab 08/06/24 1651 08/08/24 0514 08/09/24 0505 08/10/24 0445  WBC 8.3 6.6 5.7 6.3  NEUTROABS  --   --  2.9 3.0  HGB 9.6* 9.2* 8.9* 9.0*  HCT 28.9* 27.8* 26.9* 27.6*  MCV 88.1 89.7 88.5 89.3  PLT 244 232 222 238   Basic Metabolic Panel: Recent Labs  Lab 08/06/24 1651 08/08/24 0514 08/09/24 0505 08/10/24 0445  NA 140 140 135 139  K 3.5 4.1 3.8 3.9  CL 97* 98 96* 98  CO2 24 23 26 23   GLUCOSE 134* 95 88 81  BUN 58* 76* 41* 49*  CREATININE 18.20* 21.75* 14.07* 16.52*  CALCIUM  9.7 8.3* 8.7* 8.4*  MG  --   --  2.0 2.2  PHOS  --   --  7.5* 7.5*   Liver Function Tests: Recent Labs  Lab 08/07/24 1503 08/08/24 0514 08/09/24 0505 08/10/24 0445  AST 11* <10* 11* 13*  ALT 12 11 12 11   ALKPHOS 97 96 98 97  BILITOT 0.9 0.6 0.6 0.4  PROT 6.9 6.5 6.5 6.5  ALBUMIN  2.7* 2.6* 2.5* 2.6*   CBG: No results for input(s): GLUCAP in the last 168 hours.  Discharge time spent: greater than 30 minutes.  Signed: Alejandro Marker, DO [facility] 08/11/2024

## 2024-08-13 ENCOUNTER — Telehealth: Payer: Self-pay | Admitting: *Deleted

## 2024-08-13 NOTE — Transitions of Care (Post Inpatient/ED Visit) (Signed)
 08/13/2024  Name: Zachary Lynch MRN: [id number] DOB: 1986/03/23  Today's TOC FU Call Status: Today's TOC FU Call Status:: Successful TOC FU Call Completed TOC FU Call Complete Date: 08/13/24  Patient's Name and Date of Birth confirmed. Name, DOB  Transition Care Management Follow-up Telephone Call Date of Discharge: 08/10/24 Discharge Facility: [facility]) Type of Discharge: Inpatient Admission Primary Inpatient Discharge Diagnosis:: (L) kidney pyleonephritis How have you been since you were released from the hospital?: Better (I am independent, work full time in health care; I understand what to do and don't need any follow up calls) Any questions or concerns?: No  Items Reviewed: Did you receive and understand the discharge instructions provided?: Yes (thoroughly reviewed with patient who verbalizes good understanding of same) Medications obtained,verified, and reconciled?: Yes (Medications Reviewed) (Full medication reconciliation/ review completed; no concerns or discrepancies identified; confirmed patient obtained/ is taking all newly Rx'd medications as instructed; self-manages medications and denies questions/ concerns around medications today) Any new allergies since your discharge?: No Dietary orders reviewed?: Yes Type of Diet Ordered:: Regular diet, but try to follow renal diet Do you have support at home?: Yes People in Home [RPT]: other relative(s) Name of Support/Comfort Primary Source: Reports independent in self-care activities; resides with family my aunt- family assists as/ if needed/ indicated  Medications Reviewed Today: Medications Reviewed Today     Reviewed by [name], RN (Registered Nurse) on 08/13/24 at [phone]  Med List Status: <None>   Medication Order Taking? Sig Documenting Provider Last Dose Status Informant  acetaminophen  (TYLENOL ) 325 MG tablet [id number] Yes Take 2 tablets (650 mg total) by mouth every 6 (six) hours as needed for mild  pain (pain score 1-3), moderate pain (pain score 4-6), fever or headache (or Fever >/= 101). [name], [name], [place]  Active   aspirin  EC (ASPIRIN  LOW DOSE) 81 MG tablet [id number] Yes Take 1 tablet (81 mg total) by mouth daily. [name], [name], MD  Active   atorvastatin  (LIPITOR) 20 MG tablet [id number] Yes Take 1 tablet (20 mg total) by mouth daily. [name], [name], [name]  Active   carvedilol  (COREG ) 25 MG tablet [id number] Yes Take 1 tablet (25 mg total) by mouth 2 (two) times daily.  Patient taking differently: Take 25 mg by mouth 2 (two) times daily. 08/13/24: Reports during TOC call, currently taking 50 mg BID, per cardiology provider instructions- Dr. [name]     Active   ertapenem  (INVANZ ) IVPB [id number] Yes Inject 1 g into the vein Every Tuesday,Thursday,and Saturday with dialysis for 6 days. Indication:  Pyelonephritis  First Dose: Yes Last Day of Therapy:  08/17/24  Labs - Once weekly:  CBC/D and BMP, [name], DO  Active   famotidine  (PEPCID ) 40 MG tablet [id number] Yes Take 40 mg by mouth as needed for heartburn or indigestion. [provider]  Active Self, Pharmacy Records  isosorbide  mononitrate (IMDUR ) 30 MG 24 hr tablet [id number] Yes Take 1 tablet (30 mg total) by mouth daily. [name], [name], MD  Active   ondansetron  (ZOFRAN -ODT) 4 MG disintegrating tablet [id number] Yes Take 4 mg by mouth every 8 (eight) hours as needed. [provider]  Active   pantoprazole  (PROTONIX ) 40 MG tablet [id number] Yes Take 1 tablet (40 mg total) by mouth daily. [name], [name], PA-C  Active   predniSONE  (DELTASONE ) 20 MG tablet [id number] Yes Take 2 tablets (40 mg total) by mouth daily for five days as needed for gout flares  Active   sacubitril -valsartan  (ENTRESTO ) 24-26 MG [id number] Yes Take 1 tablet by mouth 2 (two) times daily. [provider]  Active   sevelamer  carbonate (RENVELA ) 800 MG tablet [id number] Yes Take 1 tablet (800 mg total) by mouth 3  (three) times daily with meals. [name], DO  Active            Home Care and Equipment/Supplies: Were Home Health Services Ordered?: No Any new equipment or medical supplies ordered?: No  Functional Questionnaire: Do you need assistance with bathing/showering or dressing?: No Do you need assistance with meal preparation?: No Do you need assistance with eating?: No Do you have difficulty maintaining continence: No Do you need assistance with getting out of bed/getting out of a chair/moving?: No Do you have difficulty managing or taking your medications?: No  Follow up appointments reviewed: PCP Follow-up appointment confirmed?: No (Declined care coordination outreach in real-time with scheduling care guide to schedule hospital follow up PCP appointment: the doctor at the hospital told me I could wait to see my PCP until my January scheduled appointment since I have HD Tuesday) MD Provider Line [phone] Given: No (verified well-established with current PCP) Specialist Hospital Follow-up appointment confirmed?: Yes Date of Specialist follow-up appointment?: 08/14/24 Follow-Up Specialty Provider:: Renal provider: [facility] Do you need transportation to your follow-up appointment?: No Do you understand care options if your condition(s) worsen?: Yes-patient verbalized understanding  SDOH Interventions Today    Flowsheet Row Most Recent Value  SDOH Interventions   Food Insecurity Interventions Intervention Not Indicated  Housing Interventions Intervention Not Indicated  Transportation Interventions Intervention Not Indicated  [drives self]  Utilities Interventions Intervention Not Indicated   See TOC assessment tabs for additional assessment/ TOC intervention information  Patient declines need for ongoing/ further care management/ coordination outreach; declines enrollment in 30-day TOC program- declines taking my direct phone number should needs/  concerns arise post-TOC call   Pls call/ message for questions,  [name], RN, BSN, CCRN Alumnus RN Care Manager  Transitions of Care  [facility] Population [facility]  [place] [phone]: direct office

## 2024-08-13 NOTE — Progress Notes (Signed)
 Late note entry 11/24 0908  D/c summary and last note faxed to [facility] [place], no further support needed.   Lavanda Eulon [place] Dialysis Navigator [phone]

## 2024-08-14 DIAGNOSIS — L02214 Cutaneous abscess of groin: Secondary | ICD-10-CM | POA: Diagnosis not present

## 2024-08-14 DIAGNOSIS — Z992 Dependence on renal dialysis: Secondary | ICD-10-CM | POA: Diagnosis not present

## 2024-08-14 DIAGNOSIS — N186 End stage renal disease: Secondary | ICD-10-CM | POA: Diagnosis not present

## 2024-08-19 DIAGNOSIS — Z992 Dependence on renal dialysis: Secondary | ICD-10-CM | POA: Diagnosis not present

## 2024-08-19 DIAGNOSIS — N186 End stage renal disease: Secondary | ICD-10-CM | POA: Diagnosis not present

## 2024-08-24 ENCOUNTER — Ambulatory Visit: Admitting: Urology

## 2024-08-24 ENCOUNTER — Encounter: Payer: Self-pay | Admitting: Urology

## 2024-08-24 VITALS — BP 129/85 | HR 80

## 2024-08-24 DIAGNOSIS — N492 Inflammatory disorders of scrotum: Secondary | ICD-10-CM | POA: Diagnosis not present
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
## 2024-08-24 NOTE — Patient Instructions (Signed)
 Skin Abscess  A skin abscess is an infected area on or under your skin. It contains pus and other material. An abscess may also be called a furuncle, carbuncle, or boil. It is often the result of an infection caused by bacteria. An abscess can occur in or on almost any part of your body. Sometimes, an abscess may break open (rupture) on its own. In most cases, it will keep getting worse unless it is treated. An abscess can cause pain and make you feel ill. An untreated abscess can cause infection to spread to other parts of your body or your bloodstream. The abscess may need to be drained. You may also need to take antibiotics. What are the causes? An abscess occurs when germs, like bacteria, pass through your skin and cause an infection. This may be caused by: A scrape or cut on your skin. A puncture wound through your skin, such as a needle injection or insect bite. Blocked oil or sweat glands. Blocked and infected hair follicles. A fluid-filled sac that forms beneath your skin (sebaceous cyst) and becomes infected. What increases the risk? You may be more likely to develop an abscess if: You have problems with blood circulation, or you have a weak body defense system (immune system). You have diabetes. You have dry and irritated skin. You get injections often or use IV drugs. You have a foreign body in a wound, such as a splinter. You smoke or use tobacco products. What are the signs or symptoms? Symptoms of this condition include: A painful, firm bump under the skin. A bump with pus at the top. This may break through the skin and drain. Other symptoms include: Redness and swelling around the abscess. Warmth or tenderness. Swelling of the lymph nodes (glands) near the abscess. A sore on the skin. How is this diagnosed? This condition may be diagnosed based on a physical exam and your medical history. You may also have tests done, such as: A test of a sample of pus. This may be done  to find what is causing the infection. Blood tests. Imaging tests, such as an ultrasound, CT scan, or MRI. How is this treated? A small abscess that drains on its own may not need to be treated. Treatment for larger abscesses may include: Moist heat or a heat pack applied to the area a few times a day. Incision and drainage. This is a procedure to drain the abscess. Antibiotics. For a severe abscess, you may first get antibiotics through an IV and then change to antibiotics by mouth. Follow these instructions at home: Medicines Take over-the-counter and prescription medicines only as told by your provider. If you were prescribed antibiotics, take them as told by your provider. Do not stop using the antibiotic even if you start to feel better. Abscess care  If you have an abscess that has not drained, apply heat to the affected area. Use the heat source that your provider recommends, such as a moist heat pack or a heating pad. Place a towel between your skin and the heat source. Leave the heat on for 20-30 minutes at a time. If your skin turns bright red, remove the heat right away to prevent burns. The risk of burns is higher if you cannot feel pain, heat, or cold. Follow instructions from your provider about how to take care of your abscess. Make sure you: Cover the abscess with a bandage (dressing). Wash your hands with soap and water for at least 20 seconds before  and after you change the dressing or gauze. If soap and water are not available, use hand sanitizer. Change your dressing or gauze as told by your provider. Check your abscess every day for signs of an infection that is getting worse. Check for: More redness, swelling, pain, or tenderness. More fluid or blood. Warmth. More pus or a worse smell. General instructions To avoid spreading the infection: Do not share personal care items, towels, or hot tubs with others. Avoid making skin contact with other people. Be careful  when getting rid of used dressings, wound packing, or any drainage from the abscess. Do not use any products that contain nicotine or tobacco. These products include cigarettes, chewing tobacco, and vaping devices, such as e-cigarettes. If you need help quitting, ask your provider. Do not use any creams, ointments, or liquids unless you have been told to by your provider. Contact a health care provider if: You see redness that spreads quickly or red streaks on your skin spreading away from the abscess. You have any signs of worse infection at the abscess. You vomit every time you eat or drink. You have a fever, chills, or muscle aches. The cyst or abscess returns. Get help right away if: You have severe pain. You make less pee (urine) than normal. This information is not intended to replace advice given to you by your health care provider. Make sure you discuss any questions you have with your health care provider. Document Revised: 04/21/2022 Document Reviewed: 04/21/2022 Elsevier Patient Education  2024 [employer].

## 2024-08-28 ENCOUNTER — Other Ambulatory Visit: Payer: Self-pay

## 2024-08-28 ENCOUNTER — Ambulatory Visit (HOSPITAL_COMMUNITY)
Admission: RE | Admit: 2024-08-28 | Discharge: 2024-08-28 | Disposition: A | Discharge status: Home / Self Care | Source: Ambulatory Visit | Attending: Family Medicine | Admitting: Family Medicine

## 2024-08-28 ENCOUNTER — Ambulatory Visit (HOSPITAL_COMMUNITY): Payer: Self-pay | Admitting: Family Medicine

## 2024-08-28 ENCOUNTER — Encounter (HOSPITAL_COMMUNITY): Payer: Self-pay

## 2024-08-28 ENCOUNTER — Other Ambulatory Visit (HOSPITAL_COMMUNITY): Payer: Self-pay

## 2024-08-28 ENCOUNTER — Telehealth (HOSPITAL_COMMUNITY): Payer: Self-pay

## 2024-08-28 VITALS — BP 140/92 | HR 79 | Ht 73.0 in | Wt 216.2 lb

## 2024-08-28 DIAGNOSIS — I132 Hypertensive heart and chronic kidney disease with heart failure and with stage 5 chronic kidney disease, or end stage renal disease: Secondary | ICD-10-CM | POA: Diagnosis not present

## 2024-08-28 DIAGNOSIS — N186 End stage renal disease: Secondary | ICD-10-CM | POA: Diagnosis not present

## 2024-08-28 DIAGNOSIS — E669 Obesity, unspecified: Secondary | ICD-10-CM | POA: Diagnosis not present

## 2024-08-28 DIAGNOSIS — I5022 Chronic systolic (congestive) heart failure: Secondary | ICD-10-CM

## 2024-08-28 DIAGNOSIS — Z992 Dependence on renal dialysis: Secondary | ICD-10-CM | POA: Diagnosis not present

## 2024-08-28 DIAGNOSIS — Z8673 Personal history of transient ischemic attack (TIA), and cerebral infarction without residual deficits: Secondary | ICD-10-CM | POA: Diagnosis not present

## 2024-08-28 DIAGNOSIS — M109 Gout, unspecified: Secondary | ICD-10-CM | POA: Diagnosis not present

## 2024-08-28 DIAGNOSIS — I10 Essential (primary) hypertension: Secondary | ICD-10-CM | POA: Diagnosis not present

## 2024-08-28 DIAGNOSIS — Z6828 Body mass index (BMI) 28.0-28.9, adult: Secondary | ICD-10-CM | POA: Diagnosis not present

## 2024-08-28 DIAGNOSIS — Z79899 Other long term (current) drug therapy: Secondary | ICD-10-CM | POA: Diagnosis not present

## 2024-08-28 DIAGNOSIS — I428 Other cardiomyopathies: Secondary | ICD-10-CM | POA: Diagnosis not present

## 2024-08-28 MED ORDER — SACUBITRIL-VALSARTAN 24-26 MG PO TABS
1.0000 | ORAL_TABLET | Freq: Two times a day (BID) | ORAL | 5 refills | Status: DC
  Filled 2024-08-28: qty 60, 30d supply, fill #0

## 2024-08-28 MED ORDER — SACUBITRIL-VALSARTAN 24-26 MG PO TABS
1.0000 | ORAL_TABLET | Freq: Two times a day (BID) | ORAL | 3 refills | Status: AC
  Filled 2024-08-28: qty 180, 90d supply, fill #0

## 2024-08-28 NOTE — Telephone Encounter (Signed)
 Advanced Heart Failure Patient Advocate Encounter  Test billing for this patient's current coverage (BCBS) returns a $0 copay for 90 day supply of Sacubitril -Valsartan .  This test claim was processed through [facility]- copay amounts may vary at other pharmacies due to pharmacy/plan contracts, or as the patient moves through the different stages of their insurance plan.  Rachel DEL, CPhT Rx Patient Advocate Phone: [phone]

## 2024-08-28 NOTE — Progress Notes (Addendum)
 [facility]ED [facility] CLIN[facility] NOTE  Primary Care: Jerrell Cleatus Ned, MD Primary Cardiologist: Dr. Zenaida  HPI: Zachary Lynch is a 38 y.o. 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      NICM that was diagnosed when he had a stroke in 2010. Initially followed Dr. Leafy at [facility], transiently wore a LifeVest and then saw Dr. Debera in 2016. Has not seen a cardiologist since. Referred back to [facility] by Severa Mink PA-C for follow-up of his HF in 2019.   Echo (2010): EF 15%.    Echo (2016): EF 25%.    Initial visit Dr. [name] 05/2018. GDMT titrated. Echo (9/19): EF 30-35%. Lost to follow up due to financial concerns.   Admitted 8/24 with a/c HF, off all meds x 3 months due to finances. HsTroponins > 5k (no CP, in setting of CKD IV) and creatinine 21.8. Had not seen Nephrology since 2022. ECG showed ST with subtle TWI. Echo showed EF 25%, moderate eccentric LVH, RV hyperdynamic. Underwent TDC placement, received dialysis on 05/17/23, and then underwent R/LHC showing mildly elevated filling pressures, borderline pulmonary hypertension, and no obstructive CAD. He continued to make some urine and was continued on Entresto . Hospitalization c/b scrotal abscess, general surgery consulted and recommended supportive care. Outpatient iHD arranged and he was discharged home, weight 220 lbs.  Admitted 3/25 with scrotal abscess. ID, urology and nephrology consulted. Admitted 11/25 with pyelonephritis.   Family Hx: Mother with MI (died at 62), HTN, father with HTN     SUBJECTIVE:  Today he returns for HF follow up. Overall feeling fine. No undue dyspnea with activities. Occasional positional dizziness, no falls. Denies palpitations, abnormal bleeding, CP, edema, or PND/Orthopnea. Appetite ok. Weight at home 210-215 pounds. Taking all  medications. He still makes urine. Dialysis days are T/Th/Sat, occasionally has low BP after HD, and he will hold his BP active meds.  PMH, current medications, allergies, social history, and family history reviewed in epic.  Wt Readings from Last 3 Encounters:  08/28/24 98.1 kg (216 lb 3.2 oz)  08/10/24 96.4 kg (212 lb 8.4 oz)  07/06/24 98.9 kg (218 lb)   BP (!) 140/92   Pulse 79   Ht 6' 1 (1.854 m)   Wt 98.1 kg (216 lb 3.2 oz)   SpO2 100%   BMI 28.52 kg/m   PHYSICAL EXAM: General:  NAD. No resp difficulty, walked into clinic HEENT: Normal Neck: Supple. No JVD. Cor: Regular rate & rhythm. No rubs, gallops or murmurs. Lungs: Clear, TDC looks ok Abdomen: Soft, nontender, nondistended.  Extremities: No cyanosis, clubbing, rash, edema Neuro: Alert & oriented x 3, moves all 4 extremities w/o difficulty. Affect pleasant.  DATA REVIEW  ECG: 10/2023: NSR, normal QRS  08/28/24: NSR 77 bpm  ECHO: 2010: EF 15% 8/24: EF 25%, moderate eccentric LVH, RV hyperdynamic.  2/25: EF 40-45%  CATH: - R/LHC (8/24): Right dominant circulation, calcified and ectatic vessels with minimal irregularities, no obstructive CAD RA 5, PA 29/19 (23), PCWP 17, CO/CI (Fick) 9.4/4.3    Heart failure review: - Classification: Heart failure with reduced EF - Etiology: HTN related - NYHA Class: I - Volume status: Euvolemic - ACEi/ARB/ARNI: Currently up-titrating - Aldosterone antagonist: Intolerant - Beta-blocker: goal dose - Digoxin: Intolerant - Hydralazine /Nitrates: intolerant - SGLT2i: Intolerant - GLP-1: Not a candidate - Advanced therapies:  Not needed at this time - ICD: not candidate, EF out of ICD window.   ASSESSMENT & PLAN:  Chronic systolic heart failure: Noted since 2010, EF had remained low for some time.  Left heart catheterization in 2024 with normal coronaries, suspect hypertension related.  EF now improved to 40 to 45% on 2/25 echo. Suspect the ability to start ARNI with  end-stage renal disease contributing. - Ejection fraction improving with NYHA class I symptoms, should not be a barrier to renal transplant consideration - Continue Entresto  24/26 mg bid. - Continue Imdur  30 mg daily (did not tolerate hydralazine ) - Continue carvedilol  25 mg bid  - Does not qualify or need advanced therapies at this time.  ESRD: Suspect HTN related, tolearting iHD well. - Continue renal follow up - Fistula/graft placement in the future, he is considering PD  CVA: Suspect due to LV thrombus from description.   HTN: BP up a bit in clinic today, but has not had AM dose of Entresto  - Continue current meds  Follow up in 3-4 months with Dr. Zenaida Harlene Gainer, FNP-BC Advanced Heart Failure 08/28/24

## 2024-08-28 NOTE — Patient Instructions (Signed)
  Follow-Up in: 3-4 months with Dr. Zenaida PLEASE CALL OUR OFFICE AROUND JANUARY 2026 TO GET SCHEDULED FOR YOUR APPOINTMENT. PHONE NUMBER IS [phone] OPTION 2   At the [facility], you and your health needs are our priority. We have a designated team specialized in the treatment of Heart Failure. This Care Team includes your primary Heart Failure Specialized Cardiologist (physician), Advanced Practice Providers (APPs- Physician Assistants and Nurse Practitioners), and Pharmacist who all work together to provide you with the care you need, when you need it.   You may see any of the following providers on your designated Care Team at your next follow up:  Dr. Toribio Fuel Dr. Ezra Shuck Dr. Odis Zenaida Greig Mosses, NP Caffie Shed, [place] [facility] [place], [place] Beckey Coe, NP Jordan Lee, NP Tinnie Redman, PharmD   Please be sure to bring in all your medications bottles to every appointment.   Need to Contact Us :  If you have any questions or concerns before your next appointment please send us  a message through [place] or call our office at [phone].    TO LEAVE A MESSAGE FOR THE NURSE SELECT OPTION 2, PLEASE LEAVE A MESSAGE INCLUDING: YOUR NAME DATE OF BIRTH CALL BACK NUMBER REASON FOR CALL**this is important as we prioritize the call backs  YOU WILL RECEIVE A CALL BACK THE SAME DAY AS LONG AS YOU CALL BEFORE 4:00 PM

## 2024-10-05 ENCOUNTER — Ambulatory Visit: Admitting: Student in an Organized Health Care Education/Training Program

## 2024-10-08 ENCOUNTER — Other Ambulatory Visit (HOSPITAL_COMMUNITY): Payer: Self-pay

## 2024-10-08 ENCOUNTER — Other Ambulatory Visit: Payer: Self-pay

## 2024-10-08 ENCOUNTER — Ambulatory Visit: Admitting: Student in an Organized Health Care Education/Training Program

## 2024-10-08 ENCOUNTER — Encounter: Payer: Self-pay | Admitting: Student in an Organized Health Care Education/Training Program

## 2024-10-08 VITALS — BP 147/112 | HR 78 | Temp 98.4°F | Ht 72.0 in | Wt 216.0 lb

## 2024-10-08 DIAGNOSIS — E785 Hyperlipidemia, unspecified: Secondary | ICD-10-CM

## 2024-10-08 DIAGNOSIS — R7303 Prediabetes: Secondary | ICD-10-CM

## 2024-10-08 DIAGNOSIS — L089 Local infection of the skin and subcutaneous tissue, unspecified: Secondary | ICD-10-CM | POA: Diagnosis not present

## 2024-10-08 DIAGNOSIS — Z992 Dependence on renal dialysis: Secondary | ICD-10-CM | POA: Diagnosis not present

## 2024-10-08 DIAGNOSIS — N186 End stage renal disease: Secondary | ICD-10-CM

## 2024-10-08 DIAGNOSIS — Z0000 Encounter for general adult medical examination without abnormal findings: Secondary | ICD-10-CM | POA: Diagnosis not present

## 2024-10-08 DIAGNOSIS — N492 Inflammatory disorders of scrotum: Secondary | ICD-10-CM

## 2024-10-08 DIAGNOSIS — I10 Essential (primary) hypertension: Secondary | ICD-10-CM | POA: Diagnosis not present

## 2024-10-08 LAB — LIPID PANEL
Cholesterol: 131 mg/dL (ref 28–200)
HDL: 33.9 mg/dL — ABNORMAL LOW
LDL Cholesterol: 86 mg/dL (ref 10–99)
NonHDL: 97.48
Total CHOL/HDL Ratio: 4
Triglycerides: 56 mg/dL (ref 10.0–149.0)
VLDL: 11.2 mg/dL (ref 0.0–40.0)

## 2024-10-08 LAB — HEMOGLOBIN A1C: Hgb A1c MFr Bld: 5 % (ref 4.6–6.5)

## 2024-10-08 MED ORDER — DOXYCYCLINE HYCLATE 100 MG PO TABS
100.0000 mg | ORAL_TABLET | Freq: Two times a day (BID) | ORAL | 0 refills | Status: AC
  Filled 2024-10-08: qty 14, 7d supply, fill #0

## 2024-10-08 MED ORDER — FAMOTIDINE 40 MG PO TABS
40.0000 mg | ORAL_TABLET | ORAL | 1 refills | Status: AC | PRN
  Filled 2024-10-08: qty 90, 90d supply, fill #0

## 2024-10-08 MED ORDER — ONDANSETRON 4 MG PO TBDP
4.0000 mg | ORAL_TABLET | Freq: Every day | ORAL | 0 refills | Status: AC | PRN
  Filled 2024-10-08: qty 20, 20d supply, fill #0

## 2024-10-08 NOTE — Patient Instructions (Signed)
" °  VISIT SUMMARY: During your visit, we discussed your recent hospitalization for a kidney infection and the discovery of a cyst on your kidney. We also addressed your ongoing symptoms, including a scrotal abscess, nausea during dialysis, and concerns about your heart condition. We reviewed your current medications and overall health management, including your history of stroke, weight loss, and fluid retention. We also discussed your upcoming surgery and your concerns about potentially switching to peritoneal dialysis.  YOUR PLAN: -RECURRENT SCROTAL ABSCESSES: A scrotal abscess is a collection of pus that causes swelling and irritation. You have a small, tender abscess on your scrotum. We will treat it with doxycycline  for one week and refer you to a dermatologist for further evaluation.  -END-STAGE RENAL DISEASE ON HEMODIALYSIS: End-stage renal disease is the final stage of chronic kidney disease where the kidneys no longer function properly. You experience nausea during dialysis, and there are concerns about using Zofran  due to your heart condition. We will check the safety of Zofran  for you and prescribe it if appropriate. We also discussed your concerns about transitioning to peritoneal dialysis and provided reassurance.  -CONGESTIVE HEART FAILURE DUE TO NONISCHEMIC CARDIOMYOPATHY: Congestive heart failure is a condition where the heart cannot pump blood effectively. Your heart function has improved significantly, and we will continue your current heart failure management regimen.  -GOUT: Gout is a form of arthritis characterized by sudden, severe attacks of pain, swelling, and redness in the joints. You manage it with prednisone  as needed, and there are no current flares. Continue using prednisone  as needed for flares.  -ESSENTIAL HYPERTENSION: Essential hypertension is high blood pressure with no identifiable cause. Your blood pressure management is ongoing with your current medications. Continue  your current antihypertensive regimen.  -INCLUSION CYST: An inclusion cyst is a benign growth that typically does not cause significant issues. You have a likely benign inclusion cyst, and we will monitor it for any changes or symptoms.  INSTRUCTIONS: Please follow up with the dermatologist for your scrotal abscess as referred. Continue your current medications and management plans for your heart condition, gout, and hypertension. We will check the safety of Zofran  for your heart condition and prescribe it if appropriate. We will also continue to monitor your inclusion cyst. Your surgery with the urologist is scheduled for February 16th, and you have another appointment in March. If you have any concerns or experience any new symptoms, please contact our office.    Contains text generated by [employer].   "

## 2024-10-08 NOTE — Assessment & Plan Note (Signed)
 Blood pressure moderately elevated today but treatment is limited by HD days.  Currently using Entresto  and tolerating it well.  Will continue to monitor.  If he switches over to peritoneal dialysis we may have some more room to escalate his antihypertensive medications.

## 2024-10-08 NOTE — Progress Notes (Signed)
 "  Complete physical exam  Patient: Zachary Lynch   DOB: 06-Nov-1985   39 y.o. Male  MRN: [id number]  Subjective:    Chief Complaint  Patient presents with   Follow-up    Patient states he has a surgery scheduled with urologist. Asking for zofran  prescription.     Zachary Lynch is a 39 y.o. male who presents today for a complete physical exam. He reports consuming a general diet. The patient does not participate in regular exercise at present. He generally feels well. He reports sleeping well. He does have additional problems to discuss today.   Discussed the use of AI scribe software for clinical note transcription with the patient, who gave verbal consent to proceed.  History of Present Illness Zachary Lynch is a 39 year old male with a history of kidney infections and abscesses who presents for a follow-up regarding recent hospitalizations and ongoing symptoms.  He recently experienced a hospitalization due to a kidney infection and the discovery of a previously undetected cyst on his kidney. During his week-long hospital stay, there were communication issues regarding fasting and medication administration. He has a history of abscesses, typically the size of a golf ball, but currently has a smaller, irritated spot on his scrotum that itches and drains without pain.  He is undergoing dialysis and typically receives Zofran  and Tums to manage nausea and acid reflux, respectively. During his recent hospital stay, he was denied Zofran  due to concerns about his heart, which was not communicated to him. He is frustrated with the lack of communication regarding his treatment and weight management at the dialysis clinic, noting that his weight goal is often changed without his knowledge, affecting his treatment.  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 He has  lost approximately 170 pounds over the past year and is aware of fluid retention signs, such as swelling in his ankles and shortness of breath.  He has been treated for urinary tract infections (UTIs) in the past, which were later identified as kidney infections. He underwent a urinalysis to investigate recurrent UTIs. He is scheduled for surgery with a urologist on February 16th and has another appointment in March.  He is currently on several medications, including Sevelamer  for phosphate binding, atorvastatin  for cholesterol, and aspirin . He uses prednisone  as needed for gout flares. He has received his flu and pneumonia vaccines. He does not exercise regularly but plays drums, which he considers a form of physical activity.  He mentions a family history of diabetes but states he is not diabetic. He is on disability and is looking for part-time work that accommodates his dialysis schedule. He is anxious about potentially switching to peritoneal dialysis (PD) and is concerned about managing the process himself.   Most recent fall risk assessment:    10/08/2024   11:22 AM  Fall Risk   Falls in the past year? 0  Number falls in past yr: 0  Injury with Fall? 0  Risk for fall due to : No Fall Risks  Follow up Falls evaluation completed     Most recent depression screenings:    10/08/2024   11:23 AM 08/13/2024    9:35 AM  PHQ 2/9 Scores  PHQ - 2 Score 0 0    Patient Care Team: Zachary Cleatus Ned, MD as PCP - General (Internal Medicine) Tobie,  Gordy POUR, MD as Consulting Physician (Nephrology) Zachary Debby PARAS, MD as Consulting Physician (Sports Medicine) [name], [name], MD as Consulting Physician (Cardiology) Ccs, Md, MD as Consulting Physician (General Surgery)      Objective:     BP (!) 147/112   Pulse 78   Temp 98.4 F (36.9 C) (Oral)   Ht 6' (1.829 m)   Wt 216 lb (98 kg)   SpO2 100%   BMI 29.29 kg/m   Physical Exam   Gen: Well appearing man Ears: Normal  hearing, normal ear canals, normal tympanic membranes Neck: Normal thyroid, no nodules or adenopathy, double HD cath is at the base of his right neck and appears clean and well-healed Heart: Regular, no murmur LUngs; unlabored, clear throughout Abd: Soft, nontender, redundant skin, no organomegaly Ext: Warm, no edema Skin: Multiple nodules and cysts on his face and scalp, on the posterior aspect of the left upper thigh there is a 4 cm indurated area of skin soft tissue infection that is tender to the touch, slightly fluctuant superficially with no drainage.      Assessment & Plan:    Routine Health Maintenance and Physical Exam  Immunization History  Administered Date(s) Administered   Influenza-Unspecified 02/23/2024   PFIZER(Purple Top)SARS-COV-2 Vaccination 10/30/2021   Tdap 06/03/2022    Health Maintenance  Topic Date Due   COVID-19 Vaccine (2 - Pfizer risk series) 11/20/2021   Influenza Vaccine  12/18/2024 (Originally 04/20/2024)   Hepatitis B Vaccines 19-59 Average Risk (2 of 3 - 19+ 3-dose series) 10/08/2025 (Originally 06/23/2023)   DTaP/Tdap/Td (2 - Td or Tdap) 06/03/2032   Pneumococcal Vaccine  Completed   HPV VACCINES (No Doses Required) Completed   Hepatitis C Screening  Completed   HIV Screening  Completed   Meningococcal B Vaccine  Aged Out    Discussed health benefits of physical activity, and encouraged him to engage in regular exercise appropriate for his age and condition.  In addition to a complete physical exam today, we also completed an acute visit for a necessary separate problem of treating the acute skin and soft tissue infection on his posterior left thigh.  Problem List Items Addressed This Visit       High   Scrotal abscess (Chronic)   Managed with urology.  Planning for scrotal excision and drainage in the OR in early February.      End-stage renal disease on hemodialysis (HCC) (Chronic)   Chronic and stable, looks euvolemic today, HD access is  through a tunneled right HD catheter.  He reports not having good access options for fistula in his arms.  He experiences nausea during dialysis and there are concerns about Zofran  use due to his heart condition. Considering transition to peritoneal dialysis (PD).  We talked about using Zofran  cautiously, last EKG had his QTc at 469 ms.        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  Will treat this with doxycycline , has tolerated the antibiotic well in the past.  Stated to refer him to dermatology for management of the multiple nodules on his face, an abscess on the scalp, and to work on the underlying etiology for these recurrent issues with abscess.      Relevant Medications   doxycycline  (VIBRA -TABS) 100 MG tablet   Other Relevant Orders   Ambulatory referral to Dermatology  Medium    Essential hypertension (Chronic)   Blood pressure moderately elevated today but treatment is limited by HD days.  Currently using Entresto  and tolerating it well.  Will continue to monitor.  If he switches over to peritoneal dialysis we may have some more room to escalate his antihypertensive medications.      Prediabetes (Chronic)   Relevant Orders   Hemoglobin A1c   Dyslipidemia (Chronic)   Relevant Orders   Lipid panel     Low   Health maintenance examination (Chronic)   Thank issues for general health in his case is tolerance of his HD cycles.  Also working on these issues with recurrent skin and soft tissue infections, and recently had a pyelonephritis requiring hospitalization.  Seems to have lots of acute stuff going on and hopefully we can get better control over this year.  No issues with mood or substance use currently.  Not due for any age-appropriate cancer screenings.  Really would like to see him stabilized more from his ESRD, stable accesses  especially going to be a challenge in the future.      Return in about 6 months (around 04/07/2025).     Cleatus Debby Specking, MD   "

## 2024-10-08 NOTE — Assessment & Plan Note (Signed)
 Chronic and stable, looks euvolemic today, HD access is through a tunneled right HD catheter.  He reports not having good access options for fistula in his arms.  He experiences nausea during dialysis and there are concerns about Zofran  use due to his heart condition. Considering transition to peritoneal dialysis (PD).  We talked about using Zofran  cautiously, last EKG had his QTc at 469 ms.

## 2024-10-08 NOTE — Assessment & Plan Note (Signed)
 Thank issues for general health in his case is tolerance of his HD cycles.  Also working on these issues with recurrent skin and soft tissue infections, and recently had a pyelonephritis requiring hospitalization.  Seems to have lots of acute stuff going on and hopefully we can get better control over this year.  No issues with mood or substance use currently.  Not due for any age-appropriate cancer screenings.  Really would like to see him stabilized more from his ESRD, stable accesses especially going to be a challenge in the future.

## 2024-10-08 NOTE — Assessment & Plan Note (Signed)
 Managed with urology.  Planning for scrotal excision and drainage in the OR in early February.

## 2024-10-08 NOTE — Assessment & Plan Note (Signed)
 Recent history of recurrent skin and soft tissue infections, has an active infection on his posterior aspect of the left upper thigh.  Will treat this with doxycycline , has tolerated the antibiotic well in the past.  Stated to refer him to dermatology for management of the multiple nodules on his face, an abscess on the scalp, and to work on the underlying etiology for these recurrent issues with abscess.

## 2024-10-09 ENCOUNTER — Ambulatory Visit: Payer: Self-pay | Admitting: Student in an Organized Health Care Education/Training Program

## 2024-10-09 ENCOUNTER — Other Ambulatory Visit (HOSPITAL_COMMUNITY): Payer: Self-pay

## 2024-10-09 ENCOUNTER — Other Ambulatory Visit: Payer: Self-pay

## 2024-10-31 ENCOUNTER — Other Ambulatory Visit (HOSPITAL_COMMUNITY)

## 2024-11-05 ENCOUNTER — Ambulatory Visit (HOSPITAL_COMMUNITY): Admission: RE | Admit: 2024-11-05 | Admitting: Urology

## 2024-11-05 ENCOUNTER — Encounter (HOSPITAL_COMMUNITY): Admission: RE | Payer: Self-pay | Source: Home / Self Care

## 2024-11-05 SURGERY — EXCISION, SCROTUM
Anesthesia: General

## 2024-11-21 ENCOUNTER — Encounter: Admitting: Urology

## 2025-04-12 ENCOUNTER — Ambulatory Visit: Admitting: Student in an Organized Health Care Education/Training Program

## 2025-06-26 ENCOUNTER — Ambulatory Visit: Admitting: Physician Assistant
